# Patient Record
Sex: Male | Born: 1955 | Race: Black or African American | Hispanic: No | Marital: Married | Smoking: Former smoker
Health system: Southern US, Community
[De-identification: ages and names within clinical notes are randomized; demographics above are authoritative.]

## PROBLEM LIST (undated history)

## (undated) DIAGNOSIS — E291 Testicular hypofunction: Secondary | ICD-10-CM

## (undated) DIAGNOSIS — M069 Rheumatoid arthritis, unspecified: Secondary | ICD-10-CM

## (undated) DIAGNOSIS — E876 Hypokalemia: Secondary | ICD-10-CM

## (undated) DIAGNOSIS — J309 Allergic rhinitis, unspecified: Secondary | ICD-10-CM

## (undated) DIAGNOSIS — I10 Essential (primary) hypertension: Secondary | ICD-10-CM

## (undated) DIAGNOSIS — R05 Cough: Secondary | ICD-10-CM

## (undated) DIAGNOSIS — E119 Type 2 diabetes mellitus without complications: Secondary | ICD-10-CM

## (undated) DIAGNOSIS — I4891 Unspecified atrial fibrillation: Secondary | ICD-10-CM

## (undated) DIAGNOSIS — I509 Heart failure, unspecified: Secondary | ICD-10-CM

## (undated) DIAGNOSIS — E7800 Pure hypercholesterolemia, unspecified: Secondary | ICD-10-CM

## (undated) DIAGNOSIS — T7840XA Allergy, unspecified, initial encounter: Secondary | ICD-10-CM

## (undated) DIAGNOSIS — M1990 Unspecified osteoarthritis, unspecified site: Secondary | ICD-10-CM

## (undated) DIAGNOSIS — K279 Peptic ulcer, site unspecified, unspecified as acute or chronic, without hemorrhage or perforation: Secondary | ICD-10-CM

## (undated) DIAGNOSIS — H409 Unspecified glaucoma: Secondary | ICD-10-CM

## (undated) DIAGNOSIS — H269 Unspecified cataract: Secondary | ICD-10-CM

## (undated) DIAGNOSIS — N529 Male erectile dysfunction, unspecified: Secondary | ICD-10-CM

## (undated) DIAGNOSIS — G629 Polyneuropathy, unspecified: Secondary | ICD-10-CM

## (undated) DIAGNOSIS — Z86010 Personal history of colonic polyps: Secondary | ICD-10-CM

## (undated) DIAGNOSIS — E11319 Type 2 diabetes mellitus with unspecified diabetic retinopathy without macular edema: Secondary | ICD-10-CM

## (undated) DIAGNOSIS — M109 Gout, unspecified: Secondary | ICD-10-CM

## (undated) DIAGNOSIS — R591 Generalized enlarged lymph nodes: Secondary | ICD-10-CM

## (undated) HISTORY — DX: Hypokalemia: E87.6

## (undated) HISTORY — DX: Allergic rhinitis, unspecified: J30.9

## (undated) HISTORY — PX: CATARACT EXTRACTION: SUR2

## (undated) HISTORY — DX: Essential (primary) hypertension: I10

## (undated) HISTORY — DX: Peptic ulcer, site unspecified, unspecified as acute or chronic, without hemorrhage or perforation: K27.9

## (undated) HISTORY — DX: Type 2 diabetes mellitus with unspecified diabetic retinopathy without macular edema: E11.319

## (undated) HISTORY — DX: Cough: R05

## (undated) HISTORY — DX: Allergy, unspecified, initial encounter: T78.40XA

## (undated) HISTORY — PX: LYMPH NODE BIOPSY: SHX201

## (undated) HISTORY — DX: Heart failure, unspecified: I50.9

## (undated) HISTORY — DX: Rheumatoid arthritis, unspecified: M06.9

## (undated) HISTORY — DX: Male erectile dysfunction, unspecified: N52.9

## (undated) HISTORY — DX: Pure hypercholesterolemia, unspecified: E78.00

## (undated) HISTORY — DX: Unspecified atrial fibrillation: I48.91

## (undated) HISTORY — DX: Personal history of colonic polyps: Z86.010

## (undated) HISTORY — DX: Unspecified glaucoma: H40.9

## (undated) HISTORY — DX: Unspecified cataract: H26.9

## (undated) HISTORY — DX: Polyneuropathy, unspecified: G62.9

## (undated) HISTORY — DX: Unspecified osteoarthritis, unspecified site: M19.90

## (undated) HISTORY — DX: Testicular hypofunction: E29.1

## (undated) HISTORY — DX: Type 2 diabetes mellitus without complications: E11.9

## (undated) HISTORY — DX: Generalized enlarged lymph nodes: R59.1

## (undated) HISTORY — PX: CATARACT EXTRACTION, BILATERAL: SHX1313

## (undated) HISTORY — DX: Gout, unspecified: M10.9

---

## 1999-04-13 ENCOUNTER — Ambulatory Visit (HOSPITAL_COMMUNITY): Admission: RE | Admit: 1999-04-13 | Discharge: 1999-04-13 | Payer: Self-pay | Admitting: Orthopaedic Surgery

## 1999-04-13 ENCOUNTER — Encounter: Payer: Self-pay | Admitting: Orthopaedic Surgery

## 2000-06-25 ENCOUNTER — Encounter: Admission: RE | Admit: 2000-06-25 | Discharge: 2000-09-23 | Payer: Self-pay | Admitting: Internal Medicine

## 2000-12-19 ENCOUNTER — Encounter: Admission: RE | Admit: 2000-12-19 | Discharge: 2000-12-19 | Payer: Self-pay | Admitting: Occupational Medicine

## 2000-12-19 ENCOUNTER — Encounter: Payer: Self-pay | Admitting: Occupational Medicine

## 2002-02-27 HISTORY — PX: COLONOSCOPY: SHX174

## 2002-06-30 ENCOUNTER — Encounter: Payer: Self-pay | Admitting: Endocrinology

## 2002-06-30 ENCOUNTER — Encounter: Admission: RE | Admit: 2002-06-30 | Discharge: 2002-06-30 | Payer: Self-pay | Admitting: Endocrinology

## 2003-02-19 LAB — HM COLONOSCOPY

## 2004-08-23 ENCOUNTER — Ambulatory Visit: Payer: Self-pay | Admitting: Endocrinology

## 2004-08-26 ENCOUNTER — Ambulatory Visit: Payer: Self-pay | Admitting: Endocrinology

## 2004-09-09 ENCOUNTER — Ambulatory Visit: Payer: Self-pay | Admitting: Endocrinology

## 2004-11-22 ENCOUNTER — Ambulatory Visit: Payer: Self-pay | Admitting: Endocrinology

## 2005-01-31 ENCOUNTER — Ambulatory Visit: Payer: Self-pay | Admitting: Endocrinology

## 2005-02-01 ENCOUNTER — Ambulatory Visit: Payer: Self-pay | Admitting: Endocrinology

## 2005-02-22 ENCOUNTER — Ambulatory Visit: Payer: Self-pay

## 2005-03-13 ENCOUNTER — Ambulatory Visit: Payer: Self-pay | Admitting: Endocrinology

## 2005-09-22 ENCOUNTER — Ambulatory Visit: Payer: Self-pay | Admitting: Endocrinology

## 2005-09-29 ENCOUNTER — Ambulatory Visit: Payer: Self-pay | Admitting: Endocrinology

## 2005-10-09 ENCOUNTER — Ambulatory Visit: Payer: Self-pay | Admitting: Endocrinology

## 2005-11-15 ENCOUNTER — Ambulatory Visit: Payer: Self-pay | Admitting: Endocrinology

## 2005-12-26 ENCOUNTER — Ambulatory Visit (HOSPITAL_COMMUNITY): Admission: RE | Admit: 2005-12-26 | Discharge: 2005-12-26 | Payer: Self-pay | Admitting: Endocrinology

## 2005-12-26 ENCOUNTER — Ambulatory Visit: Payer: Self-pay | Admitting: Endocrinology

## 2006-03-13 ENCOUNTER — Ambulatory Visit: Payer: Self-pay | Admitting: Endocrinology

## 2006-03-13 LAB — CONVERTED CEMR LAB
Hgb A1c MFr Bld: 16.8 % — ABNORMAL HIGH (ref 4.6–6.0)
Microalb Creat Ratio: 5.1 mg/g (ref 0.0–30.0)
Microalb, Ur: 0.2 mg/dL (ref 0.0–1.9)
Testosterone: 267.87 ng/dL — ABNORMAL LOW (ref 350.00–890)

## 2006-07-13 ENCOUNTER — Ambulatory Visit: Payer: Self-pay | Admitting: Endocrinology

## 2006-07-13 LAB — CONVERTED CEMR LAB
Hgb A1c MFr Bld: 14.2 % — ABNORMAL HIGH (ref 4.6–6.0)
Prolactin: 5.4 ng/mL

## 2006-08-14 ENCOUNTER — Ambulatory Visit: Payer: Self-pay | Admitting: Endocrinology

## 2006-09-22 ENCOUNTER — Encounter: Payer: Self-pay | Admitting: Endocrinology

## 2006-09-22 DIAGNOSIS — M109 Gout, unspecified: Secondary | ICD-10-CM

## 2006-09-22 DIAGNOSIS — M069 Rheumatoid arthritis, unspecified: Secondary | ICD-10-CM

## 2006-09-22 DIAGNOSIS — E119 Type 2 diabetes mellitus without complications: Secondary | ICD-10-CM

## 2006-09-22 HISTORY — DX: Type 2 diabetes mellitus without complications: E11.9

## 2006-09-22 HISTORY — DX: Rheumatoid arthritis, unspecified: M06.9

## 2006-09-22 HISTORY — DX: Gout, unspecified: M10.9

## 2006-09-30 ENCOUNTER — Encounter: Admission: RE | Admit: 2006-09-30 | Discharge: 2006-09-30 | Payer: Self-pay | Admitting: Endocrinology

## 2006-10-17 ENCOUNTER — Ambulatory Visit: Payer: Self-pay | Admitting: Endocrinology

## 2007-01-15 ENCOUNTER — Ambulatory Visit: Payer: Self-pay | Admitting: Endocrinology

## 2007-01-15 DIAGNOSIS — E291 Testicular hypofunction: Secondary | ICD-10-CM

## 2007-01-15 HISTORY — DX: Testicular hypofunction: E29.1

## 2007-02-12 LAB — CONVERTED CEMR LAB: Hgb A1c MFr Bld: 12.4 % — ABNORMAL HIGH (ref 4.6–6.0)

## 2007-05-07 ENCOUNTER — Ambulatory Visit: Payer: Self-pay | Admitting: Endocrinology

## 2007-12-27 ENCOUNTER — Ambulatory Visit: Payer: Self-pay | Admitting: Endocrinology

## 2008-02-07 ENCOUNTER — Ambulatory Visit: Payer: Self-pay | Admitting: Endocrinology

## 2008-02-07 DIAGNOSIS — N529 Male erectile dysfunction, unspecified: Secondary | ICD-10-CM

## 2008-02-07 HISTORY — DX: Male erectile dysfunction, unspecified: N52.9

## 2008-02-09 LAB — CONVERTED CEMR LAB
Basophils Absolute: 0 10*3/uL (ref 0.0–0.1)
Basophils Relative: 0.4 % (ref 0.0–3.0)
Bilirubin Urine: NEGATIVE
Bilirubin, Direct: 0.2 mg/dL (ref 0.0–0.3)
Calcium: 9.2 mg/dL (ref 8.4–10.5)
Cholesterol: 195 mg/dL (ref 0–200)
Creatinine, Ser: 0.7 mg/dL (ref 0.4–1.5)
Creatinine,U: 153.5 mg/dL
Eosinophils Absolute: 0.2 10*3/uL (ref 0.0–0.7)
GFR calc non Af Amer: 126 mL/min
HDL: 32.5 mg/dL — ABNORMAL LOW (ref >39.0)
Hemoglobin: 14.8 g/dL (ref 13.0–17.0)
Hgb A1c MFr Bld: 12.9 % — ABNORMAL HIGH (ref 4.6–6.0)
Ketones, ur: NEGATIVE mg/dL
LDL Cholesterol: 138 mg/dL — ABNORMAL HIGH (ref 0–99)
Leukocytes, UA: NEGATIVE
MCHC: 34.4 g/dL (ref 30.0–36.0)
MCV: 83.1 fL (ref 78.0–100.0)
Microalb Creat Ratio: 7.8 mg/g (ref 0.0–30.0)
Microalb, Ur: 1.2 mg/dL (ref 0.0–1.9)
Neutro Abs: 3.6 10*3/uL (ref 1.4–7.7)
Neutrophils Relative %: 68.1 % (ref 43.0–77.0)
RBC: 5.18 M/uL (ref 4.22–5.81)
RDW: 11.9 % (ref 11.5–14.6)
Sodium: 135 meq/L (ref 135–145)
Specific Gravity, Urine: 1.02 (ref 1.000–1.03)
Total Bilirubin: 0.9 mg/dL (ref 0.3–1.2)
Total CHOL/HDL Ratio: 6
Triglycerides: 122 mg/dL (ref 0–149)
Uric Acid, Serum: 5.4 mg/dL (ref 4.0–7.8)
Urine Glucose: 250 mg/dL — AB
Urobilinogen, UA: 0.2 (ref 0.0–1.0)
VLDL: 24 mg/dL (ref 0–40)

## 2008-02-13 ENCOUNTER — Ambulatory Visit: Payer: Self-pay | Admitting: Endocrinology

## 2008-06-02 ENCOUNTER — Ambulatory Visit: Payer: Self-pay | Admitting: Endocrinology

## 2008-06-15 ENCOUNTER — Ambulatory Visit: Payer: Self-pay | Admitting: Internal Medicine

## 2008-06-15 ENCOUNTER — Inpatient Hospital Stay (HOSPITAL_COMMUNITY): Admission: AD | Admit: 2008-06-15 | Discharge: 2008-06-16 | Payer: Self-pay | Admitting: Internal Medicine

## 2008-06-15 ENCOUNTER — Ambulatory Visit: Payer: Self-pay | Admitting: Cardiology

## 2008-06-15 LAB — HM DIABETES FOOT EXAM

## 2008-06-16 ENCOUNTER — Encounter: Payer: Self-pay | Admitting: Internal Medicine

## 2008-06-19 ENCOUNTER — Ambulatory Visit: Payer: Self-pay | Admitting: Endocrinology

## 2008-06-19 DIAGNOSIS — R3129 Other microscopic hematuria: Secondary | ICD-10-CM | POA: Insufficient documentation

## 2008-06-19 DIAGNOSIS — I509 Heart failure, unspecified: Secondary | ICD-10-CM

## 2008-06-19 HISTORY — DX: Heart failure, unspecified: I50.9

## 2008-06-19 LAB — CONVERTED CEMR LAB
BUN: 19 mg/dL (ref 6–23)
CO2: 27 meq/L (ref 19–32)
Chloride: 110 meq/L (ref 96–112)
Glucose, Bld: 86 mg/dL (ref 70–99)
Ketones, ur: NEGATIVE mg/dL
Leukocytes, UA: NEGATIVE
Potassium: 4.9 meq/L (ref 3.5–5.1)
Pro B Natriuretic peptide (BNP): 96 pg/mL (ref 0.0–100.0)
Specific Gravity, Urine: 1.015 (ref 1.000–1.030)
Urine Glucose: NEGATIVE mg/dL
pH: 5.5 (ref 5.0–8.0)

## 2008-06-23 ENCOUNTER — Encounter (INDEPENDENT_AMBULATORY_CARE_PROVIDER_SITE_OTHER): Payer: Self-pay | Admitting: *Deleted

## 2008-07-02 ENCOUNTER — Ambulatory Visit: Payer: Self-pay | Admitting: Endocrinology

## 2008-07-02 DIAGNOSIS — J309 Allergic rhinitis, unspecified: Secondary | ICD-10-CM | POA: Insufficient documentation

## 2008-07-02 DIAGNOSIS — E7800 Pure hypercholesterolemia, unspecified: Secondary | ICD-10-CM | POA: Insufficient documentation

## 2008-07-02 HISTORY — DX: Allergic rhinitis, unspecified: J30.9

## 2008-07-02 HISTORY — DX: Pure hypercholesterolemia, unspecified: E78.00

## 2008-07-02 LAB — CONVERTED CEMR LAB
Calcium: 9.5 mg/dL (ref 8.4–10.5)
Chloride: 106 meq/L (ref 96–112)
Creatinine, Ser: 0.9 mg/dL (ref 0.4–1.5)

## 2008-07-03 ENCOUNTER — Telehealth (INDEPENDENT_AMBULATORY_CARE_PROVIDER_SITE_OTHER): Payer: Self-pay | Admitting: *Deleted

## 2008-07-03 ENCOUNTER — Encounter: Payer: Self-pay | Admitting: Endocrinology

## 2008-07-07 ENCOUNTER — Telehealth (INDEPENDENT_AMBULATORY_CARE_PROVIDER_SITE_OTHER): Payer: Self-pay | Admitting: *Deleted

## 2008-07-07 ENCOUNTER — Encounter: Payer: Self-pay | Admitting: Endocrinology

## 2008-07-16 ENCOUNTER — Encounter: Payer: Self-pay | Admitting: Endocrinology

## 2008-07-31 ENCOUNTER — Ambulatory Visit: Payer: Self-pay | Admitting: Endocrinology

## 2008-08-01 LAB — CONVERTED CEMR LAB
Basophils Relative: 0.7 % (ref 0.0–3.0)
Eosinophils Relative: 6.8 % — ABNORMAL HIGH (ref 0.0–5.0)
Lymphocytes Relative: 40.8 % (ref 12.0–46.0)
MCV: 79.6 fL (ref 78.0–100.0)
Neutrophils Relative %: 39 % — ABNORMAL LOW (ref 43.0–77.0)
RBC: 4.56 M/uL (ref 4.22–5.81)
WBC: 5.8 10*3/uL (ref 4.5–10.5)

## 2008-08-07 ENCOUNTER — Telehealth (INDEPENDENT_AMBULATORY_CARE_PROVIDER_SITE_OTHER): Payer: Self-pay | Admitting: *Deleted

## 2008-08-10 ENCOUNTER — Ambulatory Visit: Payer: Self-pay | Admitting: Internal Medicine

## 2008-08-18 ENCOUNTER — Encounter: Payer: Self-pay | Admitting: Endocrinology

## 2008-08-18 LAB — COMPREHENSIVE METABOLIC PANEL
Alkaline Phosphatase: 78 U/L (ref 39–117)
BUN: 16 mg/dL (ref 6–23)
CO2: 23 mEq/L (ref 19–32)
Creatinine, Ser: 0.91 mg/dL (ref 0.40–1.50)
Glucose, Bld: 135 mg/dL — ABNORMAL HIGH (ref 70–99)
Sodium: 138 mEq/L (ref 135–145)
Total Bilirubin: 0.4 mg/dL (ref 0.3–1.2)

## 2008-08-18 LAB — CBC WITH DIFFERENTIAL/PLATELET
Eosinophils Absolute: 0.2 10*3/uL (ref 0.0–0.5)
HCT: 35.2 % — ABNORMAL LOW (ref 38.4–49.9)
LYMPH%: 47.2 % (ref 14.0–49.0)
MCV: 80.2 fL (ref 79.3–98.0)
MONO%: 13.4 % (ref 0.0–14.0)
NEUT#: 1.9 10*3/uL (ref 1.5–6.5)
NEUT%: 34.5 % — ABNORMAL LOW (ref 39.0–75.0)
Platelets: 181 10*3/uL (ref 140–400)
RBC: 4.4 10*6/uL (ref 4.20–5.82)

## 2008-08-18 LAB — LACTATE DEHYDROGENASE: LDH: 103 U/L (ref 94–250)

## 2008-08-20 LAB — BETA HCG QUANT (REF LAB): Beta hCG, Tumor Marker: 0.5 m[IU]/mL (ref <5.0)

## 2008-08-20 LAB — AFP TUMOR MARKER: AFP-Tumor Marker: 1.6 ng/mL (ref 0.0–8.0)

## 2008-08-21 ENCOUNTER — Ambulatory Visit (HOSPITAL_COMMUNITY): Admission: RE | Admit: 2008-08-21 | Discharge: 2008-08-21 | Payer: Self-pay | Admitting: Internal Medicine

## 2008-09-03 ENCOUNTER — Encounter: Payer: Self-pay | Admitting: Endocrinology

## 2008-10-05 ENCOUNTER — Ambulatory Visit: Payer: Self-pay | Admitting: Endocrinology

## 2008-10-05 DIAGNOSIS — R059 Cough, unspecified: Secondary | ICD-10-CM

## 2008-10-05 HISTORY — DX: Cough, unspecified: R05.9

## 2008-11-12 ENCOUNTER — Ambulatory Visit: Payer: Self-pay | Admitting: Internal Medicine

## 2008-11-16 ENCOUNTER — Ambulatory Visit (HOSPITAL_COMMUNITY): Admission: RE | Admit: 2008-11-16 | Discharge: 2008-11-16 | Payer: Self-pay | Admitting: Internal Medicine

## 2008-11-16 LAB — COMPREHENSIVE METABOLIC PANEL
AST: 18 U/L (ref 0–37)
Albumin: 3.6 g/dL (ref 3.5–5.2)
Alkaline Phosphatase: 111 U/L (ref 39–117)
BUN: 9 mg/dL (ref 6–23)
Potassium: 3.4 mEq/L — ABNORMAL LOW (ref 3.5–5.3)
Sodium: 136 mEq/L (ref 135–145)
Total Bilirubin: 0.3 mg/dL (ref 0.3–1.2)
Total Protein: 7.6 g/dL (ref 6.0–8.3)

## 2008-11-16 LAB — CBC WITH DIFFERENTIAL/PLATELET
EOS%: 2.5 % (ref 0.0–7.0)
MCH: 29.4 pg (ref 27.2–33.4)
MCV: 82.5 fL (ref 79.3–98.0)
MONO%: 12 % (ref 0.0–14.0)
RBC: 4.73 10*6/uL (ref 4.20–5.82)
RDW: 12.8 % (ref 11.0–14.6)

## 2008-11-18 ENCOUNTER — Encounter: Payer: Self-pay | Admitting: Endocrinology

## 2008-11-25 ENCOUNTER — Encounter: Payer: Self-pay | Admitting: Endocrinology

## 2008-12-02 ENCOUNTER — Encounter: Admission: RE | Admit: 2008-12-02 | Discharge: 2008-12-02 | Payer: Self-pay | Admitting: General Surgery

## 2008-12-07 ENCOUNTER — Encounter (INDEPENDENT_AMBULATORY_CARE_PROVIDER_SITE_OTHER): Payer: Self-pay | Admitting: General Surgery

## 2008-12-07 ENCOUNTER — Ambulatory Visit (HOSPITAL_BASED_OUTPATIENT_CLINIC_OR_DEPARTMENT_OTHER): Admission: RE | Admit: 2008-12-07 | Discharge: 2008-12-07 | Payer: Self-pay | Admitting: General Surgery

## 2008-12-24 ENCOUNTER — Ambulatory Visit: Payer: Self-pay | Admitting: Internal Medicine

## 2008-12-29 ENCOUNTER — Encounter: Payer: Self-pay | Admitting: Endocrinology

## 2008-12-29 LAB — CBC WITH DIFFERENTIAL/PLATELET
BASO%: 0.7 % (ref 0.0–2.0)
Basophils Absolute: 0 10*3/uL (ref 0.0–0.1)
EOS%: 4.4 % (ref 0.0–7.0)
HGB: 13.8 g/dL (ref 13.0–17.1)
MCH: 29.1 pg (ref 27.2–33.4)
MCHC: 34.9 g/dL (ref 32.0–36.0)
RDW: 13.1 % (ref 11.0–14.6)
lymph#: 1.8 10*3/uL (ref 0.9–3.3)

## 2008-12-29 LAB — COMPREHENSIVE METABOLIC PANEL
ALT: 12 U/L (ref 0–53)
AST: 13 U/L (ref 0–37)
Albumin: 4.1 g/dL (ref 3.5–5.2)
Alkaline Phosphatase: 104 U/L (ref 39–117)
Potassium: 4 mEq/L (ref 3.5–5.3)
Sodium: 136 mEq/L (ref 135–145)
Total Protein: 7.4 g/dL (ref 6.0–8.3)

## 2008-12-29 LAB — LACTATE DEHYDROGENASE: LDH: 121 U/L (ref 94–250)

## 2009-01-06 ENCOUNTER — Encounter: Payer: Self-pay | Admitting: Endocrinology

## 2009-02-08 ENCOUNTER — Ambulatory Visit: Payer: Self-pay | Admitting: Endocrinology

## 2009-02-08 LAB — CONVERTED CEMR LAB
ALT: 15 units/L (ref 0–53)
Alkaline Phosphatase: 98 units/L (ref 39–117)
Basophils Relative: 1.3 % (ref 0.0–3.0)
Bilirubin, Direct: 0.1 mg/dL (ref 0.0–0.3)
Calcium: 9.1 mg/dL (ref 8.4–10.5)
Creatinine, Ser: 0.8 mg/dL (ref 0.4–1.5)
Creatinine,U: 155.7 mg/dL
Eosinophils Absolute: 0.2 10*3/uL (ref 0.0–0.7)
Eosinophils Relative: 3.9 % (ref 0.0–5.0)
HDL: 37.1 mg/dL — ABNORMAL LOW (ref >39.00)
Hemoglobin, Urine: NEGATIVE
LDL Cholesterol: 119 mg/dL — ABNORMAL HIGH (ref 0–99)
Lymphocytes Relative: 45.6 % (ref 12.0–46.0)
Microalb Creat Ratio: 7.7 mg/g (ref 0.0–30.0)
Microalb, Ur: 1.2 mg/dL (ref 0.0–1.9)
Neutrophils Relative %: 34.1 % — ABNORMAL LOW (ref 43.0–77.0)
PSA: 1.68 ng/mL (ref 0.10–4.00)
RBC: 5 M/uL (ref 4.22–5.81)
Total Bilirubin: 0.8 mg/dL (ref 0.3–1.2)
Total CHOL/HDL Ratio: 5
Total Protein, Urine: NEGATIVE mg/dL
Total Protein: 7.5 g/dL (ref 6.0–8.3)
Triglycerides: 149 mg/dL (ref 0.0–149.0)
Urine Glucose: >=1000 mg/dL
VLDL: 29.8 mg/dL (ref 0.0–40.0)
WBC: 4.2 10*3/uL — ABNORMAL LOW (ref 4.5–10.5)
pH: 6 (ref 5.0–8.0)

## 2009-02-15 ENCOUNTER — Ambulatory Visit: Payer: Self-pay | Admitting: Endocrinology

## 2009-02-15 DIAGNOSIS — M79609 Pain in unspecified limb: Secondary | ICD-10-CM | POA: Insufficient documentation

## 2009-03-05 ENCOUNTER — Encounter: Payer: Self-pay | Admitting: Endocrinology

## 2009-04-02 ENCOUNTER — Encounter: Payer: Self-pay | Admitting: Endocrinology

## 2009-05-31 ENCOUNTER — Ambulatory Visit: Payer: Self-pay | Admitting: Endocrinology

## 2009-05-31 DIAGNOSIS — I10 Essential (primary) hypertension: Secondary | ICD-10-CM

## 2009-05-31 DIAGNOSIS — E876 Hypokalemia: Secondary | ICD-10-CM

## 2009-05-31 HISTORY — DX: Essential (primary) hypertension: I10

## 2009-05-31 LAB — CONVERTED CEMR LAB
AST: 23 units/L (ref 0–37)
Alkaline Phosphatase: 94 units/L (ref 39–117)
BUN: 11 mg/dL (ref 6–23)
Bilirubin, Direct: 0.1 mg/dL (ref 0.0–0.3)
CO2: 30 meq/L (ref 19–32)
Chloride: 103 meq/L (ref 96–112)
Creatinine, Ser: 0.7 mg/dL (ref 0.4–1.5)
Glucose, Bld: 93 mg/dL (ref 70–99)
LDL Cholesterol: 73 mg/dL (ref 0–99)
Potassium: 3.6 meq/L (ref 3.5–5.1)
Total Bilirubin: 0.6 mg/dL (ref 0.3–1.2)
Total CHOL/HDL Ratio: 3
Triglycerides: 187 mg/dL — ABNORMAL HIGH (ref 0.0–149.0)

## 2009-09-01 ENCOUNTER — Ambulatory Visit: Payer: Self-pay | Admitting: Endocrinology

## 2009-09-01 DIAGNOSIS — N476 Balanoposthitis: Secondary | ICD-10-CM | POA: Insufficient documentation

## 2009-09-01 LAB — CONVERTED CEMR LAB
Basophils Absolute: 0.1 10*3/uL (ref 0.0–0.1)
Basophils Relative: 1.1 % (ref 0.0–3.0)
CO2: 24 meq/L (ref 19–32)
Calcium: 8.9 mg/dL (ref 8.4–10.5)
Chloride: 103 meq/L (ref 96–112)
Creatinine, Ser: 0.8 mg/dL (ref 0.4–1.5)
Eosinophils Absolute: 0.3 10*3/uL (ref 0.0–0.7)
Glucose, Bld: 374 mg/dL — ABNORMAL HIGH (ref 70–99)
Lymphocytes Relative: 36.3 % (ref 12.0–46.0)
MCHC: 34.9 g/dL (ref 30.0–36.0)
MCV: 83.7 fL (ref 78.0–100.0)
Monocytes Absolute: 0.5 10*3/uL (ref 0.1–1.0)
Neutro Abs: 2.6 10*3/uL (ref 1.4–7.7)
Neutrophils Relative %: 48.4 % (ref 43.0–77.0)
RDW: 13.4 % (ref 11.5–14.6)

## 2009-10-25 ENCOUNTER — Encounter: Admission: RE | Admit: 2009-10-25 | Discharge: 2009-10-25 | Payer: Self-pay | Admitting: Orthopedic Surgery

## 2009-11-30 ENCOUNTER — Ambulatory Visit: Payer: Self-pay | Admitting: Endocrinology

## 2009-11-30 LAB — CONVERTED CEMR LAB: Hgb A1c MFr Bld: 15.8 % — ABNORMAL HIGH (ref 4.6–6.5)

## 2010-03-20 ENCOUNTER — Encounter: Payer: Self-pay | Admitting: Orthopedic Surgery

## 2010-03-20 ENCOUNTER — Encounter: Payer: Self-pay | Admitting: Endocrinology

## 2010-03-29 ENCOUNTER — Ambulatory Visit
Admission: RE | Admit: 2010-03-29 | Discharge: 2010-03-29 | Payer: Self-pay | Source: Home / Self Care | Attending: Internal Medicine | Admitting: Internal Medicine

## 2010-03-29 ENCOUNTER — Other Ambulatory Visit: Payer: Self-pay | Admitting: Internal Medicine

## 2010-03-29 LAB — CBC WITH DIFFERENTIAL/PLATELET
Basophils Relative: 1 % (ref 0.0–3.0)
Eosinophils Absolute: 0.2 10*3/uL (ref 0.0–0.7)
Eosinophils Relative: 3.9 % (ref 0.0–5.0)
HCT: 39.2 % (ref 39.0–52.0)
Lymphs Abs: 1.6 10*3/uL (ref 0.7–4.0)
MCHC: 35.2 g/dL (ref 30.0–36.0)
MCV: 81.8 fl (ref 78.0–100.0)
Monocytes Absolute: 0.5 10*3/uL (ref 0.1–1.0)
Neutro Abs: 2 10*3/uL (ref 1.4–7.7)
RBC: 4.8 Mil/uL (ref 4.22–5.81)
WBC: 4.4 10*3/uL — ABNORMAL LOW (ref 4.5–10.5)

## 2010-03-29 LAB — URINALYSIS
Bilirubin Urine: NEGATIVE
Hemoglobin, Urine: NEGATIVE
Nitrite: NEGATIVE
Total Protein, Urine: NEGATIVE
Urobilinogen, UA: 0.2 (ref 0.0–1.0)

## 2010-03-29 LAB — LIPID PANEL
Cholesterol: 220 mg/dL — ABNORMAL HIGH (ref 0–200)
Total CHOL/HDL Ratio: 5
VLDL: 44.6 mg/dL — ABNORMAL HIGH (ref 0.0–40.0)

## 2010-03-29 LAB — HEPATIC FUNCTION PANEL
ALT: 20 U/L (ref 0–53)
Albumin: 3.6 g/dL (ref 3.5–5.2)
Total Protein: 8 g/dL (ref 6.0–8.3)

## 2010-03-29 LAB — BASIC METABOLIC PANEL
CO2: 29 mEq/L (ref 19–32)
Chloride: 101 mEq/L (ref 96–112)
Creatinine, Ser: 0.8 mg/dL (ref 0.4–1.5)
Potassium: 5.6 mEq/L — ABNORMAL HIGH (ref 3.5–5.1)

## 2010-03-29 LAB — TSH: TSH: 0.77 u[IU]/mL (ref 0.35–5.50)

## 2010-03-29 NOTE — Assessment & Plan Note (Signed)
Summary: 3 mos f/u #$/cd   Vital Signs:  Patient profile:   55 year old male Height:      71 inches (180.34 cm) Weight:      204.44 pounds (92.93 kg) O2 Sat:      97 % on Room air Temp:     98.4 degrees F (36.89 degrees C) oral Pulse rate:   91 / minute BP sitting:   92 / 68  (left arm) Cuff size:   large  Vitals Entered By: Josph Macho RMA (May 31, 2009 8:12 AM)  O2 Flow:  Room air CC: 3 month follow up/ CF Is Patient Diabetic? Yes   Primary Provider:  Minus Breeding MD  CC:  3 month follow up/ CF.  History of Present Illness: pt says he has few mos of slight dizziness, but no chest pain.  no associated pain in the head. he has not had any further episodes of chf, as he had 1 year ago.  he has doe since the episode. no cbg record, but states cbg's are well-controlled, except for mild hypoglycemia at 10 am. he takes zocor as rx'ed.  Current Medications (verified): 1)  Adult Aspirin Low Strength 81 Mg  Tbdp (Aspirin) .... Take 1 By Mouth Qd 2)  Bd Insulin Syringe 27.5g X 5/8" 2 Ml Misc (Insulin Syringe-Needle U-100) .... Once A Day 3)  Cialis 20 Mg  Tabs (Tadalafil) .... Prn 4)  Allopurinol 300 Mg  Tabs (Allopurinol) .... Qd 5)  Relion 70/30 70-30 % Susp (Insulin Isophane & Regular) .... 25 Units Qam 6)  Furosemide 40 Mg Tabs (Furosemide) .Marland Kitchen.. 1 Qd 7)  Metoprolol Succinate 25 Mg Xr24h-Tab (Metoprolol Succinate) .Marland Kitchen.. 1 Qd 8)  Simvastatin 80 Mg Tabs (Simvastatin) .Marland Kitchen.. 1 Qhs 9)  Losartan Potassium 25 Mg Tabs (Losartan Potassium) .Marland Kitchen.. 1 Qd  Allergies (verified): 1)  ! Penicillin 2)  ! Corky Crafts  Past History:  Past Medical History: Last updated: 01/15/2007 Diabetes mellitus, type II Rheumatoid arthritis E.D. 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    Physical Exam  General:  normal appearance.   Lungs:  Clear to auscultation bilaterally. Normal respiratory effort.  Heart:  Regular rate and rhythm without murmurs or gallops noted. Normal S1,S2.   Additional Exam:  LDL Cholesterol     73 mg/dL Hemoglobin Q0H    47.4 %     Impression & Recommendations:  Problem # 1:  DIABETES MELLITUS, TYPE II (ICD-250.00) he may do better with an insulin which does not have to be tied to any meal.  Problem # 2:  CHF (ICD-428.0) i am uncertain if is any cardiol f/u needed  Problem # 3:  HYPERTENSION (ICD-401.9) overcontrolled  Problem # 4:  HYPERCHOLESTEROLEMIA (ICD-272.0) well-controlled  Medications Added to Medication List This Visit: 1)  Levemir 100 Unit/ml Soln (Insulin detemir) .... 25 units each am  Other Orders: Cardiology Referral (Cardiology) TLB-Lipid Panel (80061-LIPID) TLB-Hepatic/Liver Function Pnl (80076-HEPATIC) TLB-A1C / Hgb A1C (Glycohemoglobin) (83036-A1C) TLB-BMP (Basic Metabolic Panel-BMET) (80048-METABOL) Est. Patient Level IV (25956)  Patient Instructions: 1)  refer cardiology.  you will be called with a day and time for an appointment. 2)  stop losartan. 3)  change insulin to levemir 25 units each am. 4)  check your blood sugar 2 times a day.  vary the time  of day when you check, between before the 3 meals, and at bedtime.  also check if you have symptoms of your blood sugar being too high or too low.  please keep a record of the readings and bring it to your next appointment here.  please call us sooner if you are having low blood sugar episodes. 5)  tests are being ordered for you today.  a few days after the test(s), please call [phone] to hear your test results. 6)  Please schedule a follow-up appointment in 1 month. Prescriptions: LEVEMIR 100 UNIT/ML SOLN (INSULIN DETEMIR) 25 units each am  #1 vial x 11   Entered and Authorized by:   Minus Breeding MD   Signed by:   Minus Breeding MD on  05/31/2009   Method used:   Electronically to        [facility] [phone]* (retail)       [address]       [place], [place]  [address]       Ph: [phone]       Fax: [phone]   RxID:   [id number]

## 2010-03-29 NOTE — Consult Note (Signed)
Summary: [facility]  [facility]   Imported By: Lennie Odor 04/07/2009 12:29:58  _____________________________________________________________________  External Attachment:    Type:   Image     Comment:   External Document

## 2010-03-29 NOTE — Assessment & Plan Note (Signed)
Summary: 3 MOS F/U // #? CD   Vital Signs:  Patient profile:   55 year old male Height:      71 inches (180.34 cm) Weight:      201.13 pounds (91.42 kg) BMI:     28.15 O2 Sat:      97 % on Room air Temp:     97.7 degrees F (36.50 degrees C) oral Pulse rate:   72 / minute BP sitting:   102 / 66  (left arm) Cuff size:   regular  Vitals Entered By: Brenton Grills MA (September 01, 2009 8:05 AM)  O2 Flow:  Room air CC: 3 mo f/u/aj Comments pt states he is taking only adult asprin, insulin, and simvastatin   Primary Provider:  Minus Breeding MD  CC:  3 mo f/u/aj.  History of Present Illness: no cbg record, but states cbg's vary from 70-130, with no trend throughout the day.   pt states he feels well in general, except for fatigue.   pt states few weeks of moderate itching of the penis, and associated redness  Current Medications (verified): 1)  Adult Aspirin Low Strength 81 Mg  Tbdp (Aspirin) .... Take 1 By Mouth Qd 2)  Bd Insulin Syringe 27.5g X 5/8" 2 Ml Misc (Insulin Syringe-Needle U-100) .... Once A Day 3)  Cialis 20 Mg  Tabs (Tadalafil) .... Prn 4)  Allopurinol 300 Mg  Tabs (Allopurinol) .... Qd 5)  Furosemide 40 Mg Tabs (Furosemide) .Marland Kitchen.. 1 Qd 6)  Metoprolol Succinate 25 Mg Xr24h-Tab (Metoprolol Succinate) .Marland Kitchen.. 1 Qd 7)  Simvastatin 80 Mg Tabs (Simvastatin) .Marland Kitchen.. 1 Qhs 8)  Levemir 100 Unit/ml Soln (Insulin Detemir) .... 25 Units Each Am  Allergies (verified): 1)  ! Penicillin 2)  ! Corky Crafts  Past History:  Past Medical History: Last updated: 06/07/2009 Diabetes mellitus, type II Rheumatoid arthritis E.D. 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    Physical Exam  General:  normal appearance.   Lungs:  Clear to auscultation bilaterally. Normal respiratory effort.  Heart:  Regular rate and rhythm without  murmurs or gallops noted. Normal S1,S2.   Genitalia:  penis has slight erythema Additional Exam:   Hemoglobin A1C       [H]  12.6 %    Impression & Recommendations:  Problem # 1:  DIABETES MELLITUS, TYPE II (ICD-250.00)  Problem # 2:  BALANITIS (ICD-607.1) recurrent  Problem # 3:  FATIGUE (ICD-780.79) no cause found, except for #1  Problem # 4:  HYPERCHOLESTEROLEMIA (ICD-272.0) zocor can interact with fluconazole  Medications Added to Medication List This Visit: 1)  Levemir 100 Unit/ml Soln (Insulin detemir) .... 30 units each am 2)  Fluconazole 150 Mg Tabs (Fluconazole) .Marland Kitchen.. 1 once daily  Other Orders: TLB-A1C / Hgb A1C (Glycohemoglobin) (83036-A1C) TLB-BMP (Basic Metabolic Panel-BMET) (80048-METABOL) TLB-CBC Platelet - w/Differential (85025-CBCD) TLB-TSH (Thyroid Stimulating Hormone) (84443-TSH) Est. Patient Level IV ([address])  Patient Instructions: 1)  blood tests are being ordered for you today.  please call [phone] to hear your test results.  pending the test results, please continue the same medications for now 2)  check your blood sugar 2 times a day.  vary the time of day when you check, between before the 3 meals, and at bedtime.  also check if you have symptoms of your blood sugar being too high or too low.  please keep a record of the readings and bring it to your next appointment here.  please call us sooner if you are having low blood sugar episodes. 3)  Please schedule a follow-up appointment in 3 months. 4)  fluconazole 150 mg once daily, x 2 days. 5)  while on this, don't take simvastatin. 6)  (update: i left message on phone-tree:  verify you are taking levemir as rx'ed.  if not, take it as rx'ed.  if so, increase to 30 units qam). Prescriptions: LEVEMIR 100 UNIT/ML SOLN (INSULIN DETEMIR) 30 units each am  #1 vial x 11   Entered and Authorized by:   Minus Breeding MD   Signed by:   Minus Breeding MD on 09/01/2009   Method used:   Electronically to         [facility] [phone]* (retail)       [address]       [place], [place]  [address]       Ph: [phone]       Fax: [phone]   RxID:   [id number] FLUCONAZOLE 150 MG TABS (FLUCONAZOLE) 1 once daily  #2 x 0   Entered by:   Margaret Pyle, CMA   Authorized by:   Minus Breeding MD   Signed by:   Margaret Pyle, CMA on 09/01/2009   Method used:   Electronically to        [facility] [phone]* (retail)       [address]       [place], [place]  [address]       Ph: [phone]       Fax: [phone]   RxID:   [id number] FLUCONAZOLE 150 MG TABS (FLUCONAZOLE) 1 once daily  #2 x 0   Entered and Authorized by:   Minus Breeding MD   Signed by:   Minus Breeding MD on 09/01/2009   Method used:   Electronically to        [facility] [address] [phone]* (retail)       [address]       [place], [place]  [address]       Ph: [phone] or [phone]       Fax: [phone]   RxID:   [id number]

## 2010-03-29 NOTE — Assessment & Plan Note (Signed)
Summary: 3 MTH FU  STC   Vital Signs:  Patient profile:   55 year old male Height:      71 inches Weight:      196.75 pounds BMI:     27.54 O2 Sat:      98 % on Room air Temp:     98.1 degrees F oral Pulse rate:   72 / minute BP sitting:   104 / 66  (left arm) Cuff size:   regular  Vitals Entered By: Margaret Pyle, CMA (November 30, 2009 8:03 AM)  O2 Flow:  Room air CC: 3 mth follow up   Primary Provider:  Minus Breeding MD  CC:  3 mth follow up.  History of Present Illness: pt states he feels well in general.  no cbg record, but states cbg's are well-controlled.  he takes levemir 25 units each am.    Allergies: 1)  ! Penicillin 2)  ! Corky Crafts  Past History:  Past Medical History: Last updated: 06/07/2009 Diabetes mellitus, type II Rheumatoid arthritis E.D. 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    Physical Exam  General:  normal appearance.   Pulses:  dorsalis pedis intact bilat.   Extremities:  no deformity.  no ulcer on the feet.  feet are of normal color and temp.  no edema  Neurologic:  sensation is intact to touch on the feet   Additional Exam:   Hemoglobin A1C       [H]  15.8 %    Impression & Recommendations:  Problem # 1:  DIABETES MELLITUS, TYPE II (ICD-250.00) very poor control.  ? compliance  Other Orders: TLB-A1C / Hgb A1C (Glycohemoglobin) (83036-A1C) Est. Patient Level III ([address])  Patient Instructions: 1)  blood tests are being ordered for you today.  please call [phone] to hear your test results.  pending the test results, please continue the same medications for now.  however, after seeing the results, i may advise increasing the insulin to 30 units each am.   2)  check your blood sugar 2 times a day.  vary the time of day when you check, between before the 3 meals, and at bedtime.  also check if you have symptoms of  your blood sugar being too high or too low.  please keep a record of the readings and bring it to your next appointment here.  please call us sooner if you are having low blood sugar episodes. 3)  Please schedule a physical appointment in 3-4 months. 4)  (update: i left message on phone-tree:  rx as we discussed)

## 2010-04-05 ENCOUNTER — Encounter: Payer: Self-pay | Admitting: Endocrinology

## 2010-04-05 ENCOUNTER — Encounter (INDEPENDENT_AMBULATORY_CARE_PROVIDER_SITE_OTHER): Payer: BC Managed Care – PPO | Admitting: Endocrinology

## 2010-04-05 DIAGNOSIS — E119 Type 2 diabetes mellitus without complications: Secondary | ICD-10-CM

## 2010-04-05 DIAGNOSIS — Z0000 Encounter for general adult medical examination without abnormal findings: Secondary | ICD-10-CM

## 2010-04-14 NOTE — Assessment & Plan Note (Signed)
Summary: CPX/NWS #   Vital Signs:  Patient profile:   55 year old male Height:      71 inches (180.34 cm) Weight:      187.50 pounds (85.23 kg) BMI:     26.25 O2 Sat:      99 % on Room air Temp:     98.6 degrees F (37.00 degrees C) oral Pulse rate:   79 / minute Pulse rhythm:   regular BP sitting:   124 / 72  (left arm) Cuff size:   regular  Vitals Entered By: Brenton Grills CMA Duncan Dull) (April 05, 2010 8:35 AM)  O2 Flow:  Room air CC: CPX/pt is no longer taking Allopurinol, Furosemide, or Metoprolol/aj Is Patient Diabetic? Yes   Primary Provider:  Minus Breeding MD  CC:  CPX/pt is no longer taking Allopurinol, Furosemide, and or Metoprolol/aj.  History of Present Illness: here for regular wellness examination.  He's feeling pretty well in general, and does not drink or smoke.  Current Medications (verified): 1)  Adult Aspirin Low Strength 81 Mg  Tbdp (Aspirin) .... Take 1 By Mouth Qd 2)  Bd Insulin Syringe 27.5g X 5/8" 2 Ml Misc (Insulin Syringe-Needle U-100) .... Once A Day 3)  Cialis 20 Mg  Tabs (Tadalafil) .... Prn 4)  Allopurinol 300 Mg  Tabs (Allopurinol) .... Qd 5)  Furosemide 40 Mg Tabs (Furosemide) .Marland Kitchen.. 1 Qd 6)  Metoprolol Succinate 25 Mg Xr24h-Tab (Metoprolol Succinate) .Marland Kitchen.. 1 Qd 7)  Simvastatin 80 Mg Tabs (Simvastatin) .Marland Kitchen.. 1 Qhs 8)  Levemir 100 Unit/ml Soln (Insulin Detemir) .... 30 Units Each Am  Allergies (verified): 1)  ! Penicillin 2)  ! Corky Crafts  Family History: Reviewed history from 06/15/2008 and no changes required. no cancer Family History of CAD Male 1st degree relative <50 Family History of Sudden Death  Social History: Reviewed history from 06/15/2008 and no changes required. 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  The patient denies fever, decreased hearing, syncope, prolonged cough, headaches, abdominal pain,  melena, hematochezia, severe indigestion/heartburn, hematuria, suspicious skin lesions, and depression.    Physical Exam  General:  normal appearance.   Head:  head: no deformity eyes: no periorbital swelling, no proptosis external nose and ears are normal mouth: no lesion seen Neck:  Supple without thyroid enlargement or tenderness.  Lungs:  Clear to auscultation bilaterally. Normal respiratory effort.  Heart:  Regular rate and rhythm without murmurs or gallops noted. Normal S1,S2.   Abdomen:  abdomen is soft, nontender.  no hepatosplenomegaly.   not distended.  no hernia  Rectal:  normal external and internal exam.  heme neg  Prostate:  Normal size prostate without masses or tenderness.  Msk:  muscle bulk and strength are grossly normal.  no obvious joint swelling.  gait is normal and steady  Neurologic:  cn 2-12 grossly intact.   readily moves all 4's.    Skin:  normal texture and temp.  no rash.  not diaphoretic  Cervical Nodes:  No significant adenopathy.  Psych:  Alert and cooperative; normal mood and affect; normal attention span and concentration.   Additional Exam:  SEPARATE EVALUATION FOLLOWS--EACH PROBLEM HERE IS NEW, NOT RESPONDING TO TREATMENT, OR POSES SIGNIFICANT RISK TO THE PATIENT'S HEALTH: HISTORY OF THE PRESENT ILLNESS: the status of at least 3 ongoing medical problems is addressed today: pt says he  cannot afford his insulin.  no cbg record, but states cbg's are high.  no hypoglycemic sxs. mild hyperkalemia is noted.  it was borderline high in the past.  he denies cramps pt says he sometimes misses his zocor.  no chest pain PAST MEDICAL HISTORY reviewed and up to date today REVIEW OF SYSTEMS: he has intermittent blurry vision.  denies sob PHYSICAL EXAMINATION: no deformity.  no ulcer on the feet.  feet are of normal color and temp.  no edema.  there are mycotic toenails. dorsalis pedis intact bilat.  no carotid bruit sensation is intact to touch on the  feet LAB/XRAY RESULTS: Hemoglobin A1C       [H]  16.8 %                      4.6-6.5 Potassium            [H]  5.6 mEq/L    Cholesterol LDL             148.7 mg/dL IMPRESSION: hyperkalemia, uncertain etiology dm, therapy limited by noncompliance.  i'll do the best i can. dyslipidemia, therapy limited by noncompliance.  i'll do the best i can. PLAN: see instruction page   Impression & Recommendations:  Problem # 1:  ROUTINE GENERAL MEDICAL EXAM@HEALTH  CARE FACL (ICD-V70.0)  Medications Added to Medication List This Visit: 1)  Humulin N 100 Unit/ml Susp (Insulin isophane human) .... 30 units each am, and syringes once daily  Other Orders: EKG w/ Interpretation (93000) Est. Patient Level IV (16109) Est. Patient 40-64 years ([address]454)   Patient Instructions: 1)  change your insulin to humulin nph, 30 units each am. 2)  Please schedule a follow-up appointment in 1 month. 3)  always take meds as prescribed. 4)  good diet and exercise habits significanly improve the control of your diabetes.  please let me know if you wish to be referred to a dietician.  high blood sugar is very risky to your health.  you should see an eye doctor every year. 5)  controlling your blood pressure and cholesterol drastically reduces the damage diabetes does to your body.  this also applies to quitting smoking.  please discuss these with your doctor.  you should take an aspirin every day, unless you have been advised by a doctor not to. 6)  please consider these measures for your health:  minimize alcohol.  do not use tobacco products.  have a colonoscopy at least every 10 years from age 74.  keep firearms safely stored.  always use seat belts.  have working smoke alarms in your home.  see an eye doctor and dentist regularly.  never drive under the influence of alcohol or drugs (including prescription drugs).   Prescriptions: HUMULIN N 100 UNIT/ML SUSP (INSULIN ISOPHANE HUMAN) 30 units each am, and syringes once  daily  #3 vials x 3   Entered and Authorized by:   Minus Breeding MD   Signed by:   Minus Breeding MD on 04/05/2010   Method used:   Electronically to        [facility] [phone]* (retail)       [address]       [place], [place]  [address]       Ph: [phone]       Fax: [phone]   RxID:   [id number]    Orders Added: 1)  EKG w/ Interpretation [93000] 2)  Est. Patient Level IV [36644] 3)  Est. Patient 40-64 years [phone]

## 2010-05-10 ENCOUNTER — Ambulatory Visit: Payer: BC Managed Care – PPO | Admitting: Endocrinology

## 2010-06-02 LAB — GLUCOSE, CAPILLARY
Glucose-Capillary: 241 mg/dL — ABNORMAL HIGH (ref 70–99)
Glucose-Capillary: 294 mg/dL — ABNORMAL HIGH (ref 70–99)

## 2010-06-02 LAB — BASIC METABOLIC PANEL
CO2: 25 mEq/L (ref 19–32)
Chloride: 104 mEq/L (ref 96–112)
Creatinine, Ser: 0.69 mg/dL (ref 0.4–1.5)
GFR calc Af Amer: >60 mL/min (ref >60)
Glucose, Bld: 241 mg/dL — ABNORMAL HIGH (ref 70–99)

## 2010-06-02 LAB — POCT HEMOGLOBIN-HEMACUE: Hemoglobin: 15.8 g/dL (ref 13.0–17.0)

## 2010-06-08 LAB — COMPREHENSIVE METABOLIC PANEL
AST: 15 U/L (ref 0–37)
BUN: 10 mg/dL (ref 6–23)
CO2: 19 mEq/L (ref 19–32)
Calcium: 8.6 mg/dL (ref 8.4–10.5)
Creatinine, Ser: 0.88 mg/dL (ref 0.4–1.5)
GFR calc Af Amer: >60 mL/min (ref >60)
GFR calc non Af Amer: >60 mL/min (ref >60)
Glucose, Bld: 67 mg/dL — ABNORMAL LOW (ref 70–99)
Total Bilirubin: 0.3 mg/dL (ref 0.3–1.2)

## 2010-06-08 LAB — URINALYSIS, ROUTINE W REFLEX MICROSCOPIC
Bilirubin Urine: NEGATIVE
Glucose, UA: NEGATIVE mg/dL
Ketones, ur: NEGATIVE mg/dL
Leukocytes, UA: NEGATIVE
Specific Gravity, Urine: 1.012 (ref 1.005–1.030)
pH: 6 (ref 5.0–8.0)

## 2010-06-08 LAB — CARDIAC PANEL(CRET KIN+CKTOT+MB+TROPI)
CK, MB: 0.9 ng/mL (ref 0.3–4.0)
Relative Index: 0.7 (ref 0.0–2.5)
Total CK: 121 U/L (ref 7–232)
Total CK: 197 U/L (ref 7–232)
Total CK: 226 U/L (ref 7–232)
Troponin I: <0.01 ng/mL (ref 0.00–0.06)
Troponin I: <0.01 ng/mL (ref 0.00–0.06)

## 2010-06-08 LAB — D-DIMER, QUANTITATIVE: D-Dimer, Quant: 0.46 ug/mL-FEU (ref 0.00–0.48)

## 2010-06-08 LAB — URINE MICROSCOPIC-ADD ON

## 2010-06-08 LAB — CBC
Hemoglobin: 11.8 g/dL — ABNORMAL LOW (ref 13.0–17.0)
MCV: 82.8 fL (ref 78.0–100.0)
RBC: 4.15 MIL/uL — ABNORMAL LOW (ref 4.22–5.81)
WBC: 8.3 10*3/uL (ref 4.0–10.5)

## 2010-06-08 LAB — GLUCOSE, CAPILLARY: Glucose-Capillary: 88 mg/dL (ref 70–99)

## 2010-06-08 LAB — BRAIN NATRIURETIC PEPTIDE: Pro B Natriuretic peptide (BNP): 341 pg/mL — ABNORMAL HIGH (ref 0.0–100.0)

## 2010-06-20 ENCOUNTER — Other Ambulatory Visit: Payer: Self-pay | Admitting: Endocrinology

## 2010-06-20 MED ORDER — SIMVASTATIN 80 MG PO TABS: 80.0000 mg | ORAL_TABLET | Freq: Every evening | ORAL | Status: DC

## 2010-06-20 NOTE — Telephone Encounter (Signed)
R'cd fax from [facility] for pt's Simvasatin   Last OV-04/05/2010  Last Filled-11/30/2009

## 2010-07-12 NOTE — Discharge Summary (Signed)
NAMEOLEN, EAVES           ACCOUNT NO.:  [id number]   MEDICAL RECORD NO.:  [id number]          PATIENT TYPE:  INP   LOCATION:  3729                         FACILITY:  [facility]   PHYSICIAN:  Valerie A. Felicity Coyer, MDDATE OF BIRTH:  03/22/1955   DATE OF ADMISSION:  06/15/2008  DATE OF DISCHARGE:  06/16/2008                               DISCHARGE SUMMARY   DISCHARGE DIAGNOSES:  1. Chest tightness, question anginal equivalent, status post adenosine      Cardiolite, negative for ischemia.  2. Acute heart failure, presumed systolic echocardiogram pending at      time of evaluation.  Chest x-ray changes with edema, status post IV      Lasix with improvement in symptoms.  Close outpatient followup.      See details below.  3. Type 2 diabetes, insulin dependent, excellent inpatient control.      Outpatient followup.  4. Dyslipidemia.  Continue home statin treatment.  5. Hypertension, previously on treatment.  Close outpatient followup      with primary MD to consider other alternative treatment especially      in the setting of other cardiac risk factors.  6. Microscopic hematuria on incidental UA with 11-20 rbc's.  Recheck      and follow up with primary care physician end of this week.  7. History of gout.  8. Edema.  Suspect related to problem #2 above, resolved overnight      with IV diuresis.  No complaints at this time.   DISCHARGE MEDICATIONS:  1. Lasix 40 mg once daily.  2. Potassium chloride 10 mEq once daily with Lasix.   OTHER MEDICATIONS ARE AS PRIOR TO ADMISSION:  1. Insulin 70/30 25 units subcu nightly.  2. Vytorin 10/80 p.o. nightly.  3. Allopurinol 300 mg p.o. daily.   DISPOSITION:  The patient is discharged home and medically stable and  improved condition.  He has had no further swelling, cough, shortness of  breath, or other complaints during this time.  Wife is present at  bedside and he is anxious for discharge home.  Hospital followup is with  primary care  physician Dr. Romero Belling, for the end of this week,  Friday, June 19, 2008 at 10:30 a.m.  At this visit, we will reevaluate  final results of 2-D echo performed during this hospitalization as well  as recheck labs to monitor tolerance of diuretics and potassium  supplement needs.  We would also recheck urinalysis or consider Urology  evaluation if persisting microscopic hematuria as needed.   CONDITION ON DISCHARGE:  Hemodynamically stable, asymptomatic, and  anxious for discharge home.   HOSPITAL COURSE BY PROBLEM:  1. Edema.  The patient is a 55 year old gentleman with multiple      cardiac risk factors who came to primary care physician's office as      a work in due to complaints of chest tightness with 1-week history      shortness of breath, dyspnea on exertion as well as swelling.  This      was felt to be concerning for an anginal equivalent and a diabetic  with strong family history of premature coronary artery disease.      So, he was admitted to [facility] on telemetry for further      evaluation.  Serial cardiac enzymes were cycled and fortunately      negative.  He was also treated with IV Lasix due to findings of      pulmonary edema on his chest x-ray.  With IV diuresis, the patient      felt symptomatically much improved.  He was anxious for discharge      home.  He underwent an adenosine Cardiolite, which was negative for      ischemia during this hospitalization and a 2-D echo has been      performed, but not yet read at time of dictation due to the      patient's marked improvement in symptoms and reliability in      followup.  He is felt safe at this time, stable for discharge home      to continue diuretic therapy with empiric potassium replacement and      close outpatient followup on his symptoms at home for what appears      to be acute systolic heart failure.  Continue his other medical      management of coexisting diabetes and dyslipidemia as  ongoing prior      to admission and further adjustment of medications with referral as      appropriate, will be deferred to primary MD.  For other chronic      medical issues, please see list above for these diagnoses and      management as described.  Over 30 minutes on day of discharge      coordination for the patient exam, review of data, evaluation, and      establishment of followup.      Valerie A. Felicity Coyer, MD  Electronically Signed     VAL/MEDQ  D:  06/16/2008  T:  06/17/2008  Job:  [id number]

## 2010-08-26 ENCOUNTER — Encounter: Payer: Self-pay | Admitting: Endocrinology

## 2010-08-26 ENCOUNTER — Ambulatory Visit (INDEPENDENT_AMBULATORY_CARE_PROVIDER_SITE_OTHER): Payer: BC Managed Care – PPO | Admitting: Endocrinology

## 2010-08-26 DIAGNOSIS — E119 Type 2 diabetes mellitus without complications: Secondary | ICD-10-CM

## 2010-08-26 NOTE — Patient Instructions (Addendum)
Increase insulin to 60 units each morning.  Then increase to 65 units each morning, if blood sugar stays high. Please make a follow-up appointment in 1 month. good diet and exercise habits significanly improve the control of your diabetes.  please let me know if you wish to be referred to a dietician.  high blood sugar is very risky to your health.  you should see an eye doctor every year. controlling your blood pressure and cholesterol drastically reduces the damage diabetes does to your body.  this also applies to quitting smoking.  please discuss these with your doctor.  you should take an aspirin every day, unless you have been advised by a doctor not to. check your blood sugar 2 times a day.  vary the time of day when you check, between before the 3 meals, and at bedtime.  also check if you have symptoms of your blood sugar being too high or too low.  please keep a record of the readings and bring it to your next appointment here.  please call us sooner if you are having low blood sugar episodes, or if it stays over 200.

## 2010-08-26 NOTE — Progress Notes (Signed)
  Subjective:    Patient ID: Aaron Fox, male    DOB: [birth year], 55 y.o.   MRN: [id number]  HPI Pt increased his nph insulin to 50 units qam.  Despite this increase, cbg's continue to be 200-300.  pt states he feels well in general, except for blurry vision.    Review of Systems denies hypoglycemia    Objective:   Physical Exam GENERAL: no distress Pulses: dorsalis pedis intact bilat.   Feet: no deformity.  no ulcer on the feet.  feet are of normal color and temp.  no edema Neuro: sensation is intact to touch on the feet        Assessment & Plan:

## 2010-09-23 ENCOUNTER — Encounter: Payer: Self-pay | Admitting: Endocrinology

## 2010-09-23 ENCOUNTER — Ambulatory Visit (INDEPENDENT_AMBULATORY_CARE_PROVIDER_SITE_OTHER): Payer: BC Managed Care – PPO | Admitting: Endocrinology

## 2010-09-23 DIAGNOSIS — E119 Type 2 diabetes mellitus without complications: Secondary | ICD-10-CM

## 2010-09-23 MED ORDER — VENLAFAXINE HCL ER 75 MG PO CP24: 75.0000 mg | ORAL_CAPSULE | Freq: Every day | ORAL | Status: DC

## 2010-09-23 NOTE — Progress Notes (Signed)
  Subjective:    Patient ID: Aaron Fox, male    DOB: [birth year], 55 y.o.   MRN: [id number]  HPI Pt takes only 50 units of nph insulin each am.  no cbg record, but states cbg's are "a little high."  He says his meter is not working.  Pt states 1 year of moderate pain at the legs, and assoc numbness.   Past Medical History  Diagnosis Date  . DIABETES MELLITUS, TYPE II 09/22/2006  . HYPOGONADISM, MALE 01/15/2007  . HYPERCHOLESTEROLEMIA 07/02/2008  . GOUT 09/22/2006  . HYPERTENSION 05/31/2009  . CHF 06/19/2008  . ALLERGIC RHINITIS 07/02/2008  . 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 Rheumatoid arthritis 09/22/2006  . COUGH DUE TO ACE INHIBITORS 10/05/2008  . Glaucoma   . Atrial fibrillation   . ED (erectile dysfunction)   . PUD (peptic ulcer disease)     Past Surgical History  Procedure Date  . Spine surgery     Lumbar Spine Surgery  . Ankle fusion     left  . Lower arterial 02/22/2005  . Ct sinus ltd w/o cm 04/06/1997  . Electrocardiogram 09/29/2005    History   Social History  . Marital Status: Married    Spouse Name: N/A    Number of Children: N/A  . Years of Education: N/A   Occupational History  . 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 Smoking status: Former Smoker    Quit date: 02/28/2000  . Smokeless tobacco: Not on file  . Alcohol Use: No  . Drug Use: No  . Sexually Active: Not on file   Other Topics Concern  . Not on file   Social History Narrative   Regular exercise-yes    Current Outpatient Prescriptions on File Prior to Visit  Medication Sig Dispense Refill  . aspirin 81 MG tablet Take 81 mg by mouth daily.        . insulin NPH (HUMULIN N) 100 UNIT/ML injection Inject 60 Units into the skin every morning.       . Insulin Syringe-Needle U-100 (B-D INSULIN SYRINGE 2CC/27.5G) 27.5G X 5/8" 2 ML MISC Use as directed once daily       . simvastatin (ZOCOR) 80 MG tablet Take 1 tablet (80 mg total) by mouth every evening.   30 tablet  5  . tadalafil (CIALIS) 20 MG tablet Take 20 mg by mouth as needed.          Allergies  Allergen Reactions  . Lamisil   . Penicillins     Family History  Problem Relation Age of Onset  . Cancer Neg Hx   . Heart disease Other     FH of CAD    BP 132/82  Pulse 77  Temp(Src) 98.4 F (36.9 C) (Oral)  Ht 5\' 11"  (1.803 m)  Wt 206 lb 6.4 oz (93.622 kg)  BMI 28.79 kg/m2  SpO2 97%  Review of Systems denies hypoglycemia and loc    Objective:   Physical Exam Pulses: dorsalis pedis intact bilat.   Feet: no deformity.  no ulcer on the feet.  feet are of normal color and temp.  no edema Neuro: sensation is intact to touch on the feet, but decreased from normal.    Assessment & Plan:  Dm, therapy limited by noncompliance.  i'll do the best i can. Painful neuropathy, worse.

## 2010-09-23 NOTE — Patient Instructions (Addendum)
Increase insulin to 60 units each morning.   Please make a follow-up appointment in 1 month. check your blood sugar 2 times a day.  vary the time of day when you check, between before the 3 meals, and at bedtime.  also check if you have symptoms of your blood sugar being too high or too low.  please keep a record of the readings and bring it to your next appointment here.  please call us sooner if you are having low blood sugar episodes, or if it stays over 200.   Add effexor-xr 75 mg daily.  This will help your pain.

## 2010-11-15 ENCOUNTER — Ambulatory Visit: Payer: BC Managed Care – PPO | Admitting: Endocrinology

## 2011-01-25 ENCOUNTER — Other Ambulatory Visit: Payer: Self-pay | Admitting: Endocrinology

## 2011-07-04 ENCOUNTER — Ambulatory Visit: Payer: Self-pay | Admitting: Endocrinology

## 2011-11-09 ENCOUNTER — Ambulatory Visit: Payer: Self-pay | Admitting: Endocrinology

## 2011-11-13 ENCOUNTER — Encounter: Payer: Self-pay | Admitting: Endocrinology

## 2011-11-13 ENCOUNTER — Ambulatory Visit (INDEPENDENT_AMBULATORY_CARE_PROVIDER_SITE_OTHER): Payer: PRIVATE HEALTH INSURANCE | Admitting: Endocrinology

## 2011-11-13 ENCOUNTER — Other Ambulatory Visit (INDEPENDENT_AMBULATORY_CARE_PROVIDER_SITE_OTHER): Payer: PRIVATE HEALTH INSURANCE

## 2011-11-13 ENCOUNTER — Telehealth: Payer: Self-pay | Admitting: *Deleted

## 2011-11-13 VITALS — BP 124/68 | HR 84 | Temp 97.1°F | Ht 71.0 in | Wt 209.0 lb

## 2011-11-13 DIAGNOSIS — M109 Gout, unspecified: Secondary | ICD-10-CM

## 2011-11-13 DIAGNOSIS — Z125 Encounter for screening for malignant neoplasm of prostate: Secondary | ICD-10-CM

## 2011-11-13 DIAGNOSIS — R209 Unspecified disturbances of skin sensation: Secondary | ICD-10-CM

## 2011-11-13 DIAGNOSIS — Z79899 Other long term (current) drug therapy: Secondary | ICD-10-CM

## 2011-11-13 DIAGNOSIS — E7800 Pure hypercholesterolemia, unspecified: Secondary | ICD-10-CM

## 2011-11-13 DIAGNOSIS — I10 Essential (primary) hypertension: Secondary | ICD-10-CM

## 2011-11-13 DIAGNOSIS — E119 Type 2 diabetes mellitus without complications: Secondary | ICD-10-CM

## 2011-11-13 DIAGNOSIS — M25511 Pain in right shoulder: Secondary | ICD-10-CM

## 2011-11-13 DIAGNOSIS — M25519 Pain in unspecified shoulder: Secondary | ICD-10-CM

## 2011-11-13 DIAGNOSIS — G622 Polyneuropathy due to other toxic agents: Secondary | ICD-10-CM | POA: Insufficient documentation

## 2011-11-13 DIAGNOSIS — E876 Hypokalemia: Secondary | ICD-10-CM

## 2011-11-13 DIAGNOSIS — G619 Inflammatory polyneuropathy, unspecified: Secondary | ICD-10-CM

## 2011-11-13 DIAGNOSIS — E291 Testicular hypofunction: Secondary | ICD-10-CM

## 2011-11-13 LAB — CBC WITH DIFFERENTIAL/PLATELET
Basophils Absolute: 0 10*3/uL (ref 0.0–0.1)
Eosinophils Relative: 4.2 % (ref 0.0–5.0)
MCV: 83 fl (ref 78.0–100.0)
Monocytes Absolute: 0.5 10*3/uL (ref 0.1–1.0)
Neutrophils Relative %: 44.7 % (ref 43.0–77.0)
Platelets: 173 10*3/uL (ref 150.0–400.0)
RDW: 13.1 % (ref 11.5–14.6)
WBC: 4 10*3/uL — ABNORMAL LOW (ref 4.5–10.5)

## 2011-11-13 LAB — HEPATIC FUNCTION PANEL
ALT: 20 U/L (ref 0–53)
AST: 21 U/L (ref 0–37)
Albumin: 3.6 g/dL (ref 3.5–5.2)
Total Bilirubin: 0.7 mg/dL (ref 0.3–1.2)

## 2011-11-13 LAB — URINALYSIS, ROUTINE W REFLEX MICROSCOPIC
Hgb urine dipstick: NEGATIVE
Leukocytes, UA: NEGATIVE
Nitrite: NEGATIVE
Total Protein, Urine: NEGATIVE
Urobilinogen, UA: 0.2 (ref 0.0–1.0)

## 2011-11-13 LAB — BASIC METABOLIC PANEL
Calcium: 9.5 mg/dL (ref 8.4–10.5)
Creatinine, Ser: 0.8 mg/dL (ref 0.4–1.5)
GFR: 132.61 mL/min (ref >60.00)
Glucose, Bld: 509 mg/dL (ref 70–99)
Sodium: 133 mEq/L — ABNORMAL LOW (ref 135–145)

## 2011-11-13 LAB — LIPID PANEL
LDL Cholesterol: 55 mg/dL (ref 0–99)
Total CHOL/HDL Ratio: 3
Triglycerides: 191 mg/dL — ABNORMAL HIGH (ref 0.0–149.0)

## 2011-11-13 LAB — MICROALBUMIN / CREATININE URINE RATIO: Microalb Creat Ratio: 11.3 mg/g (ref 0.0–30.0)

## 2011-11-13 LAB — TSH: TSH: 1.28 u[IU]/mL (ref 0.35–5.50)

## 2011-11-13 LAB — VITAMIN B12: Vitamin B-12: 283 pg/mL (ref 211–911)

## 2011-11-13 MED ORDER — INSULIN NPH (HUMAN) (ISOPHANE) 100 UNIT/ML ~~LOC~~ SUSP: 60.0000 [IU] | SUBCUTANEOUS | Status: DC

## 2011-11-13 MED ORDER — FLUOCINONIDE 0.05 % EX CREA: TOPICAL_CREAM | Freq: Every day | CUTANEOUS | Status: DC

## 2011-11-13 MED ORDER — GABAPENTIN 300 MG PO CAPS: 300.0000 mg | ORAL_CAPSULE | Freq: Every day | ORAL | Status: DC

## 2011-11-13 MED ORDER — FLUOCINONIDE 0.05 % EX CREA: TOPICAL_CREAM | Freq: Every day | CUTANEOUS | Status: AC

## 2011-11-13 MED ORDER — SIMVASTATIN 80 MG PO TABS: ORAL_TABLET | ORAL | Status: DC

## 2011-11-13 MED ORDER — GLUCOSE BLOOD VI STRP: 1.0000 | ORAL_STRIP | Freq: Two times a day (BID) | Status: DC

## 2011-11-13 NOTE — Telephone Encounter (Signed)
Lab called with critical-pt's glucose was 509.

## 2011-11-13 NOTE — Patient Instructions (Addendum)
blood tests are being requested for you today.  You will receive a letter with results. i have sent several prescription to your pharmacy. Please come in soon for a regular physical. in my opinion, you are completely and permanently disabled.  You should apply for disability.  Refer to an orthopedic specialist.  you will receive a phone call, about a day and time for an appointment.

## 2011-11-13 NOTE — Progress Notes (Signed)
Subjective:    Patient ID: Aaron Fox, male    DOB: April 04, 1955, 56 y.o.   MRN: [id number]  HPI Pt returns for f/u of insulin-requiring DM (dx'ed 2002; complicated by peripheral sensory neuropathy). Pt states 1 year of moderate rash of both hands, and assoc itching.    Past Medical History  Diagnosis Date  . DIABETES MELLITUS, TYPE II 09/22/2006  . HYPOGONADISM, MALE 01/15/2007  . HYPERCHOLESTEROLEMIA 07/02/2008  . GOUT 09/22/2006  . HYPERTENSION 05/31/2009  . CHF 06/19/2008  . ALLERGIC RHINITIS 07/02/2008  . 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 Rheumatoid arthritis 09/22/2006  . COUGH DUE TO ACE INHIBITORS 10/05/2008  . Glaucoma   . Atrial fibrillation   . ED (erectile dysfunction)   . PUD (peptic ulcer disease)     Past Surgical History  Procedure Date  . Spine surgery     Lumbar Spine Surgery  . Ankle fusion     left  . Lower arterial 02/22/2005  . Ct sinus ltd w/o cm 04/06/1997  . Electrocardiogram 09/29/2005    History   Social History  . Marital Status: Married    Spouse Name: N/A    Number of Children: N/A  . Years of Education: N/A   Occupational History  . 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 Smoking status: Former Smoker    Quit date: 02/28/2000  . Smokeless tobacco: Not on file  . Alcohol Use: No  . Drug Use: No  . Sexually Active: Not on file   Other Topics Concern  . Not on file   Social History Narrative   Regular exercise-yes    Current Outpatient Prescriptions on File Prior to Visit  Medication Sig Dispense Refill  . aspirin 81 MG tablet Take 81 mg by mouth daily.        . insulin NPH (HUMULIN N) 100 UNIT/ML injection Inject 60 Units into the skin every morning.  2 vial  11  . Insulin Syringe-Needle U-100 (B-D INSULIN SYRINGE 2CC/27.5G) 27.5G X 5/8" 2 ML MISC Use as directed once daily       . simvastatin (ZOCOR) 80 MG tablet TAKE 1 TABLET (80 MG TOTAL) BY MOUTH EVERY EVENING.  90 tablet  1  .  gabapentin (NEURONTIN) 300 MG capsule Take 1 capsule (300 mg total) by mouth at bedtime.  90 capsule  3  . tadalafil (CIALIS) 20 MG tablet Take 20 mg by mouth as needed.        . venlafaxine (EFFEXOR-XR) 75 MG 24 hr capsule Take 1 capsule (75 mg total) by mouth daily.  30 capsule  11    Allergies  Allergen Reactions  . Penicillins   . Terbinafine Hcl     Family History  Problem Relation Age of Onset  . Cancer Neg Hx   . Heart disease Other     FH of CAD   BP 124/68  Pulse 84  Temp 97.1 F (36.2 C) (Oral)  Ht 5\' 11"  (1.803 m)  Wt 209 lb (94.802 kg)  BMI 29.15 kg/m2  SpO2 96%  Review of Systems He has numbness and pain of his feet.  He has right shoulder pain.      Objective:   Physical Exam VITAL SIGNS:  See vs page. GENERAL: no distress. Neuro: sensation is intact to touch on the hands.   Right shoulder: full rom, but rom is painful.   Pulses: dorsalis pedis intact bilat.   Feet: no  deformity.  no ulcer on the feet.  feet are of normal color and temp.  no edema Neuro: sensation is intact to touch on the feet, but severely decreased from normal.  Lab Results  Component Value Date   HGBA1C 12.9* 11/13/2011   (i reviewed pncv done on 03/05/09)    Assessment & Plan:  DM, poor control, usually due to noncompliance Peripheral neuropathy.  He is disabled for this reason Dyshidrosis, new

## 2011-11-28 ENCOUNTER — Other Ambulatory Visit: Payer: Self-pay | Admitting: Endocrinology

## 2011-11-28 ENCOUNTER — Ambulatory Visit (INDEPENDENT_AMBULATORY_CARE_PROVIDER_SITE_OTHER): Payer: PRIVATE HEALTH INSURANCE | Admitting: Endocrinology

## 2011-11-28 ENCOUNTER — Encounter: Payer: Self-pay | Admitting: Endocrinology

## 2011-11-28 VITALS — BP 126/78 | HR 89 | Temp 98.0°F | Resp 16 | Ht 72.0 in | Wt 205.6 lb

## 2011-11-28 DIAGNOSIS — E119 Type 2 diabetes mellitus without complications: Secondary | ICD-10-CM

## 2011-11-28 MED ORDER — CLOMIPHENE CITRATE 50 MG PO TABS: ORAL_TABLET | ORAL | Status: DC

## 2011-11-28 NOTE — Telephone Encounter (Signed)
Pt is requesting Rx refill of Zocor. Under meds in comments section it states that there are no refills for this med. Please advise.

## 2011-11-28 NOTE — Progress Notes (Signed)
Subjective:    Patient ID: Aaron Fox, male    DOB: November 20, 1955, 56 y.o.   MRN: [id number]  HPI here for regular wellness examination.  He's feeling pretty well in general, and says chronic med probs are stable, except as noted below Past Medical History  Diagnosis Date  . DIABETES MELLITUS, TYPE II 09/22/2006  . HYPOGONADISM, MALE 01/15/2007  . HYPERCHOLESTEROLEMIA 07/02/2008  . GOUT 09/22/2006  . HYPERTENSION 05/31/2009  . CHF 06/19/2008  . ALLERGIC RHINITIS 07/02/2008  . 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 Rheumatoid arthritis 09/22/2006  . COUGH DUE TO ACE INHIBITORS 10/05/2008  . Glaucoma   . Atrial fibrillation   . ED (erectile dysfunction)   . PUD (peptic ulcer disease)     Past Surgical History  Procedure Date  . Spine surgery     Lumbar Spine Surgery  . Ankle fusion     left  . Lower arterial 02/22/2005  . Ct sinus ltd w/o cm 04/06/1997  . Electrocardiogram 09/29/2005    History   Social History  . Marital Status: Married    Spouse Name: N/A    Number of Children: N/A  . Years of Education: N/A   Occupational History  . 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 Smoking status: Former Smoker    Quit date: 02/28/2000  . Smokeless tobacco: Not on file  . Alcohol Use: No  . Drug Use: No  . Sexually Active: Not on file   Other Topics Concern  . Not on file   Social History Narrative   Regular exercise-yes    Current Outpatient Prescriptions on File Prior to Visit  Medication Sig Dispense Refill  . aspirin 81 MG tablet Take 81 mg by mouth daily.        . fluocinonide cream (LIDEX) 0.05 % Apply topically at bedtime.  60 g  3  . gabapentin (NEURONTIN) 300 MG capsule Take 1 capsule (300 mg total) by mouth at bedtime.  90 capsule  3  . glucose blood (ONE TOUCH ULTRA TEST) test strip 1 each by Other route 2 (two) times daily. And lancets 2/day 250.03  100 each  12  . Insulin Syringe-Needle U-100 (B-D INSULIN SYRINGE  2CC/27.5G) 27.5G X 5/8" 2 ML MISC Use as directed once daily       . simvastatin (ZOCOR) 80 MG tablet TAKE 1 TABLET (80 MG TOTAL) BY MOUTH EVERY EVENING.  90 tablet  1  . tadalafil (CIALIS) 20 MG tablet Take 20 mg by mouth as needed.        Marland Kitchen DISCONTD: insulin NPH (HUMULIN N) 100 UNIT/ML injection Inject 60 Units into the skin every morning.  2 vial  11  . venlafaxine (EFFEXOR-XR) 75 MG 24 hr capsule Take 1 capsule (75 mg total) by mouth daily.  30 capsule  11    Allergies  Allergen Reactions  . Penicillins   . Terbinafine Hcl     Family History  Problem Relation Age of Onset  . Cancer Neg Hx   . Heart disease Other     FH of CAD    BP 126/78  Pulse 89  Temp 98 F (36.7 C) (Oral)  Resp 16  Ht 6' (1.829 m)  Wt 205 lb 9 oz (93.243 kg)  BMI 27.88 kg/m2  SpO2 98%  Review of Systems  Constitutional: Negative for fever and unexpected weight change.  HENT: Negative for hearing loss.   Eyes: Negative for  visual disturbance.  Respiratory: Negative for shortness of breath.   Cardiovascular: Negative for chest pain.  Gastrointestinal: Negative for anal bleeding.  Genitourinary: Negative for hematuria and difficulty urinating.  Musculoskeletal: Negative for back pain.  Skin: Negative for rash.  Neurological: Negative for syncope and headaches.  Hematological: Does not bruise/bleed easily.  Psychiatric/Behavioral: Negative for dysphoric mood.       Objective:   Physical Exam VS: see vs page GEN: no distress HEAD: head: no deformity eyes: no periorbital swelling, no proptosis external nose and ears are normal mouth: no lesion seen NECK: supple, thyroid is not enlarged CHEST WALL: no deformity LUNGS: clear to auscultation BREASTS:  No gynecomastia CV: reg rate and rhythm, no murmur ABD: abdomen is soft, nontender.  no hepatosplenomegaly.  not distended.  no hernia RECTAL: normal external and internal exam.  heme neg. PROSTATE:  Normal size.  No nodule MUSCULOSKELETAL:  muscle bulk and strength are grossly normal.  no obvious joint swelling.  gait is normal and steady PULSES: no carotid bruit NEURO:  cn 2-12 grossly intact.   readily moves all 4's.   SKIN:  Normal texture and temperature.  No rash or suspicious lesion is visible.   NODES:  None palpable at the neck PSYCH: alert, oriented x3.  Does not appear anxious nor depressed.    ecg was not done, because machine not available    Assessment & Plan:  Wellness visit today, with problems stable, except as noted.   SEPARATE EVALUATION FOLLOWS--EACH PROBLEM HERE IS NEW, NOT RESPONDING TO TREATMENT, OR POSES SIGNIFICANT RISK TO THE PATIENT'S HEALTH: HISTORY OF THE PRESENT ILLNESS: Pt returns for f/u of insulin-requiring DM (dx'ed 2002; complicated by peripheral sensory neuropathy; therapy limited by pt's need for a simple, inexpensive regimen).  He says cbg's are still "high."  denies hypoglycemia. PAST MEDICAL HISTORY reviewed and up to date today REVIEW OF SYSTEMS: No change in chronic numbness of the feet PHYSICAL EXAMINATION: VITAL SIGNS:  See vs page GENERAL: no distress Pulses: dorsalis pedis intact bilat.   Feet: no deformity.  no ulcer on the feet.  feet are of normal color and temp.  no edema.  There is bilateral onychomycosis. Neuro: sensation is intact to touch on the feet, but severely decreased from normal. LAB/XRAY RESULTS: Lab Results  Component Value Date   HGBA1C 12.9* 11/13/2011  IMPRESSION: DM, needs increased rx PLAN: See instruction page

## 2011-11-28 NOTE — Patient Instructions (Addendum)
Increase the insulin to 70 units every morning.  Please call if your blood sugar stays high. Resume the "clomiphine" medication, to raise the testosterone.  i have sent a prescription to your pharmacy.  please consider these measures for your health:  minimize alcohol.  do not use tobacco products.  have a colonoscopy at least every 10 years from age 55.  keep firearms safely stored.  always use seat belts.  have working smoke alarms in your home.  see an eye doctor and dentist regularly.  never drive under the influence of alcohol or drugs (including prescription drugs).  Please come back for a follow-up appointment in 3 months.

## 2011-12-29 ENCOUNTER — Ambulatory Visit: Payer: PRIVATE HEALTH INSURANCE | Admitting: Internal Medicine

## 2012-03-12 ENCOUNTER — Ambulatory Visit (INDEPENDENT_AMBULATORY_CARE_PROVIDER_SITE_OTHER): Payer: BC Managed Care – PPO | Admitting: Endocrinology

## 2012-03-12 VITALS — BP 122/80 | HR 76 | Temp 98.8°F | Wt 217.0 lb

## 2012-03-12 DIAGNOSIS — E119 Type 2 diabetes mellitus without complications: Secondary | ICD-10-CM

## 2012-03-12 DIAGNOSIS — E291 Testicular hypofunction: Secondary | ICD-10-CM

## 2012-03-12 LAB — HEMOGLOBIN A1C: Hgb A1c MFr Bld: 13.7 % — ABNORMAL HIGH (ref 4.6–6.5)

## 2012-03-12 MED ORDER — GABAPENTIN 300 MG PO CAPS: 300.0000 mg | ORAL_CAPSULE | Freq: Three times a day (TID) | ORAL | Status: DC

## 2012-03-12 NOTE — Addendum Note (Signed)
Addended by: Aubery Lapping on: 03/12/2012 08:20 AM   Modules accepted: Orders

## 2012-03-12 NOTE — Progress Notes (Signed)
Subjective:    Patient ID: Aaron Fox, male    DOB: Jul 04, 1955, 57 y.o.   MRN: [id number]  HPIThe state of at least three ongoing medical problems is addressed today, with interval history of each noted here: Pt returns for f/u of insulin-requiring DM (dx'ed 2002; complicated by peripheral sensory neuropathy; therapy limited by pt's need for a simple, inexpensive regimen).  He says cbg's have improved overall.  He has mild hypoglycemia in the early am.  He therefore decreased the insulin to 60 units qam.  He says he never misses the insulin.   Painful neuropathy: foot pain persists Hypogonadism: Ed persists despite clomid Past Medical History  Diagnosis Date  . DIABETES MELLITUS, TYPE II 09/22/2006  . HYPOGONADISM, MALE 01/15/2007  . HYPERCHOLESTEROLEMIA 07/02/2008  . GOUT 09/22/2006  . HYPERTENSION 05/31/2009  . CHF 06/19/2008  . ALLERGIC RHINITIS 07/02/2008  . 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 Rheumatoid arthritis 09/22/2006  . COUGH DUE TO ACE INHIBITORS 10/05/2008  . Glaucoma   . Atrial fibrillation   . ED (erectile dysfunction)   . PUD (peptic ulcer disease)     Past Surgical History  Procedure Date  . Spine surgery     Lumbar Spine Surgery  . Ankle fusion     left  . Lower arterial 02/22/2005  . Ct sinus ltd w/o cm 04/06/1997  . Electrocardiogram 09/29/2005    History   Social History  . Marital Status: Married    Spouse Name: N/A    Number of Children: N/A  . Years of Education: N/A   Occupational History  . 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 Smoking status: Former Smoker    Quit date: 02/28/2000  . Smokeless tobacco: Not on file  . Alcohol Use: No  . Drug Use: No  . Sexually Active: Not on file   Other Topics Concern  . Not on file   Social History Narrative   Regular exercise-yes    Current Outpatient Prescriptions on File Prior to Visit  Medication Sig Dispense Refill  . aspirin 81 MG tablet Take 81 mg  by mouth daily.        . clomiPHENE (CLOMID) 50 MG tablet 1/4 tab daily  10 tablet  11  . fluocinonide cream (LIDEX) 0.05 % Apply topically at bedtime.  60 g  3  . gabapentin (NEURONTIN) 300 MG capsule Take 1 capsule (300 mg total) by mouth 3 (three) times daily.  90 capsule  11  . glucose blood (ONE TOUCH ULTRA TEST) test strip 1 each by Other route 2 (two) times daily. And lancets 2/day 250.03  100 each  12  . insulin NPH (HUMULIN N,NOVOLIN N) 100 UNIT/ML injection Inject 70 Units into the skin every morning.      . Insulin Syringe-Needle U-100 (B-D INSULIN SYRINGE 2CC/27.5G) 27.5G X 5/8" 2 ML MISC Use as directed once daily       . simvastatin (ZOCOR) 80 MG tablet TAKE 1 TABLET (80 MG TOTAL) BY MOUTH EVERY EVENING.  90 tablet  1  . tadalafil (CIALIS) 20 MG tablet Take 20 mg by mouth as needed.        . venlafaxine (EFFEXOR-XR) 75 MG 24 hr capsule Take 1 capsule (75 mg total) by mouth daily.  30 capsule  11    Allergies  Allergen Reactions  . Penicillins   . Terbinafine Hcl     Family History  Problem Relation Age of  Onset  . Cancer Neg Hx   . Heart disease Other     FH of CAD    BP 122/80  Pulse 76  Temp 98.8 F (37.1 C) (Oral)  Wt 217 lb (98.431 kg)  SpO2 98%   Review of Systems Denies LOC. Denies decreased urinary stream    Objective:   Physical Exam VITAL SIGNS:  See vs page GENERAL: no distress SKIN:  Insulin injection sites at the anterior abdomen are normal      Assessment & Plan:  DM, therapy limited by pt's need for a simple, inexpensive regimen. Hypogonadism, uncertain control Peripheral neuropathy, needs increased rx

## 2012-03-12 NOTE — Patient Instructions (Addendum)
blood tests are being requested for you today.  We'll contact you with results. check your blood sugar twice a day.  vary the time of day when you check, between before the 3 meals, and at bedtime.  also check if you have symptoms of your blood sugar being too high or too low.  please keep a record of the readings and bring it to your next appointment here.  please call us sooner if your blood sugar goes below 70, or if you have a lot of readings over 200. Please come back for a follow-up appointment in 3 months. 

## 2012-04-05 ENCOUNTER — Other Ambulatory Visit: Payer: Self-pay | Admitting: Endocrinology

## 2012-04-05 ENCOUNTER — Telehealth: Payer: Self-pay | Admitting: Endocrinology

## 2012-04-05 NOTE — Telephone Encounter (Signed)
Gout has flared back up again, would like script called in at [facility] / [place]. Patient's call back number [phone]

## 2012-04-05 NOTE — Telephone Encounter (Signed)
i can't see where pt has been on med for acute gouty flare. Please advise sat clinic

## 2012-04-05 NOTE — Telephone Encounter (Signed)
PATIENT REQUEST MEDICATION FOR GOUT FLARE UP. PLEASE ADVISE.

## 2012-04-08 NOTE — Telephone Encounter (Signed)
Pt advised and stated he would speak with receptionist for an appt.

## 2012-04-10 ENCOUNTER — Ambulatory Visit (INDEPENDENT_AMBULATORY_CARE_PROVIDER_SITE_OTHER): Payer: BC Managed Care – PPO | Admitting: Endocrinology

## 2012-04-10 VITALS — BP 124/78 | HR 90 | Wt 225.0 lb

## 2012-04-10 DIAGNOSIS — E119 Type 2 diabetes mellitus without complications: Secondary | ICD-10-CM

## 2012-04-10 MED ORDER — COLCHICINE 0.6 MG PO TABS: ORAL_TABLET | ORAL | Status: DC

## 2012-04-10 MED ORDER — ALLOPURINOL 300 MG PO TABS: 300.0000 mg | ORAL_TABLET | Freq: Every day | ORAL | Status: DC

## 2012-04-10 NOTE — Progress Notes (Signed)
Subjective:    Patient ID: Aaron Fox, male    DOB: [birth year], 56 y.o.   MRN: [id number]  HPI Pt returns for f/u of insulin-requiring DM (dx'ed 2002; complicated by peripheral sensory neuropathy; therapy limited by pt's need for a simple, inexpensive regimen).  no cbg record, but states cbg's are persistently 200-400.   Pt states 1 week of slight swelling of the right foot, and assoc pain.  sxs are much better now.  He is unable to cite precip factor such as local injury.  He thinks it was gout.  However, the gouty episodes he had in the past were not there.  He has been off allopurinol x a few years.   Past Medical History  Diagnosis Date  . DIABETES MELLITUS, TYPE II 09/22/2006  . HYPOGONADISM, MALE 01/15/2007  . HYPERCHOLESTEROLEMIA 07/02/2008  . GOUT 09/22/2006  . HYPERTENSION 05/31/2009  . CHF 06/19/2008  . ALLERGIC RHINITIS 07/02/2008  . 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 Rheumatoid arthritis 09/22/2006  . COUGH DUE TO ACE INHIBITORS 10/05/2008  . Glaucoma   . Atrial fibrillation   . ED (erectile dysfunction)   . PUD (peptic ulcer disease)     Past Surgical History  Procedure Laterality Date  . Spine surgery      Lumbar Spine Surgery  . Ankle fusion      left  . Lower arterial  02/22/2005  . Ct sinus ltd w/o cm  04/06/1997  . Electrocardiogram  09/29/2005    History   Social History  . Marital Status: Married    Spouse Name: N/A    Number of Children: N/A  . Years of Education: N/A   Occupational History  . 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 Smoking status: Former Smoker    Quit date: 02/28/2000  . Smokeless tobacco: Not on file  . Alcohol Use: No  . Drug Use: No  . Sexually Active: Not on file   Other Topics Concern  . Not on file   Social History Narrative   Regular exercise-yes    Current Outpatient Prescriptions on File Prior to Visit  Medication Sig Dispense Refill  . aspirin 81 MG tablet Take 81 mg by  mouth daily.        . clomiPHENE (CLOMID) 50 MG tablet 1/4 tab daily  10 tablet  11  . fluocinonide cream (LIDEX) 0.05 % Apply topically at bedtime.  60 g  3  . gabapentin (NEURONTIN) 300 MG capsule Take 1 capsule (300 mg total) by mouth 3 (three) times daily.  90 capsule  11  . glucose blood (ONE TOUCH ULTRA TEST) test strip 1 each by Other route 2 (two) times daily. And lancets 2/day 250.03  100 each  12  . insulin NPH (HUMULIN N,NOVOLIN N) 100 UNIT/ML injection Inject 80 Units into the skin every morning.       . Insulin Syringe-Needle U-100 (B-D INSULIN SYRINGE 2CC/27.5G) 27.5G X 5/8" 2 ML MISC Use as directed once daily       . simvastatin (ZOCOR) 80 MG tablet TAKE 1 TABLET (80 MG TOTAL) BY MOUTH EVERY EVENING.  90 tablet  1  . tadalafil (CIALIS) 20 MG tablet Take 20 mg by mouth as needed.        . venlafaxine (EFFEXOR-XR) 75 MG 24 hr capsule Take 1 capsule (75 mg total) by mouth daily.  30 capsule  11   No current facility-administered medications on file  prior to visit.    Allergies  Allergen Reactions  . Penicillins   . Terbinafine Hcl     Family History  Problem Relation Age of Onset  . Cancer Neg Hx   . Heart disease Other     FH of CAD    BP 124/78  Pulse 90  Wt 225 lb (102.059 kg)  BMI 30.51 kg/m2  SpO2 98%  Review of Systems Denies fever and hypoglycemia    Objective:   Physical Exam VITAL SIGNS:  See vs page GENERAL: no distress  Right foot: Pulses: dorsalis pedis intact. no deformity.  no ulcer.  normal color and temp.  no edema, but there is slight swelling at the lateral aspect Neuro: sensation is intact to touch.      Assessment & Plan:  Foot pain, new, ? Due to gout DM: needs increased rx

## 2012-04-10 NOTE — Patient Instructions (Addendum)
i'm glad your pain is better, but i'm not sure it is gout.  Please cal if the pain worsens again. i have sent a prescription to your pharmacy, for a pill against the gout.  Do not exceed 6 per day.  Skip 2 days of simvastatin if you take this.   In 2 weeks, resume the allopurinol.  i have sent a prescription to your pharmacy.   Please increase the insulin to 80 units each morning.   i'll see you next time.

## 2012-05-10 ENCOUNTER — Other Ambulatory Visit: Payer: Self-pay | Admitting: *Deleted

## 2012-05-10 MED ORDER — SIMVASTATIN 80 MG PO TABS: 80.0000 mg | ORAL_TABLET | Freq: Every day | ORAL | Status: DC

## 2012-06-18 ENCOUNTER — Encounter: Payer: Self-pay | Admitting: Endocrinology

## 2012-06-18 ENCOUNTER — Ambulatory Visit (INDEPENDENT_AMBULATORY_CARE_PROVIDER_SITE_OTHER): Payer: BC Managed Care – PPO | Admitting: Endocrinology

## 2012-06-18 VITALS — BP 126/80 | HR 78 | Wt 223.0 lb

## 2012-06-18 DIAGNOSIS — E119 Type 2 diabetes mellitus without complications: Secondary | ICD-10-CM

## 2012-06-18 MED ORDER — INSULIN GLARGINE 100 UNIT/ML ~~LOC~~ SOLN: 70.0000 [IU] | SUBCUTANEOUS | Status: DC

## 2012-06-18 NOTE — Progress Notes (Signed)
Subjective:    Patient ID: Aaron Fox, male    DOB: 1955-10-02, 57 y.o.   MRN: [id number]  HPI Pt returns for f/u of insulin-requiring DM (dx'ed 2002; complicated by peripheral sensory neuropathy; therapy limited by pt's need for a simple, inexpensive regimen; he has never had severe hypoglycemia or DKA).  He takes only 60 units of NPH per day.  He has no cbg record, but states cbg's are persistently 58-200.  He says it is lowest in the mid-morning, and highest in the evening.   Past Medical History  Diagnosis Date  . DIABETES MELLITUS, TYPE II 09/22/2006  . HYPOGONADISM, MALE 01/15/2007  . HYPERCHOLESTEROLEMIA 07/02/2008  . GOUT 09/22/2006  . HYPERTENSION 05/31/2009  . CHF 06/19/2008  . ALLERGIC RHINITIS 07/02/2008  . 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 Rheumatoid arthritis 09/22/2006  . COUGH DUE TO ACE INHIBITORS 10/05/2008  . Glaucoma   . Atrial fibrillation   . ED (erectile dysfunction)   . PUD (peptic ulcer disease)     Past Surgical History  Procedure Laterality Date  . Spine surgery      Lumbar Spine Surgery  . Ankle fusion      left  . Lower arterial  02/22/2005  . Ct sinus ltd w/o cm  04/06/1997  . Electrocardiogram  09/29/2005    History   Social History  . Marital Status: Married    Spouse Name: N/A    Number of Children: N/A  . Years of Education: N/A   Occupational History  . 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 Smoking status: Former Smoker    Quit date: 02/28/2000  . Smokeless tobacco: Not on file  . Alcohol Use: No  . Drug Use: No  . Sexually Active: Not on file   Other Topics Concern  . Not on file   Social History Narrative   Regular exercise-yes    Current Outpatient Prescriptions on File Prior to Visit  Medication Sig Dispense Refill  . allopurinol (ZYLOPRIM) 300 MG tablet Take 1 tablet (300 mg total) by mouth daily.  30 tablet  6  . aspirin 81 MG tablet Take 81 mg by mouth daily.        .  clomiPHENE (CLOMID) 50 MG tablet 1/4 tab daily  10 tablet  11  . colchicine 0.6 MG tablet 1 pill every hour until gout is better.  Stop if diarrhea  10 tablet  2  . fluocinonide cream (LIDEX) 0.05 % Apply topically at bedtime.  60 g  3  . gabapentin (NEURONTIN) 300 MG capsule Take 1 capsule (300 mg total) by mouth 3 (three) times daily.  90 capsule  11  . glucose blood (ONE TOUCH ULTRA TEST) test strip 1 each by Other route 2 (two) times daily. And lancets 2/day 250.03  100 each  12  . Insulin Syringe-Needle U-100 (B-D INSULIN SYRINGE 2CC/27.5G) 27.5G X 5/8" 2 ML MISC Use as directed once daily       . simvastatin (ZOCOR) 80 MG tablet Take 1 tablet (80 mg total) by mouth at bedtime.  90 tablet  2  . tadalafil (CIALIS) 20 MG tablet Take 20 mg by mouth as needed.        . venlafaxine (EFFEXOR-XR) 75 MG 24 hr capsule Take 1 capsule (75 mg total) by mouth daily.  30 capsule  11   No current facility-administered medications on file prior to visit.    Allergies  Allergen Reactions  . Penicillins   . Terbinafine Hcl     Family History  Problem Relation Age of Onset  . Cancer Neg Hx   . Heart disease Other     FH of CAD    BP 126/80  Pulse 78  Wt 223 lb (101.152 kg)  BMI 30.24 kg/m2  SpO2 98%   Review of Systems Denies LOC    Objective:   Physical Exam VITAL SIGNS:  See vs page GENERAL: no distress Pulses: dorsalis pedis intact bilat.   Feet: no deformity.  no ulcer on the feet.  feet are of normal color and temp.  no edema. There is bilateral onychomycosis.   Neuro: sensation is intact to touch on the feet, but severely decreased from normal     Assessment & Plan:  DM: he says today he is willing to take the more expensive lantus.

## 2012-06-18 NOTE — Patient Instructions (Addendum)
check your blood sugar twice a day.  vary the time of day when you check, between before the 3 meals, and at bedtime.  also check if you have symptoms of your blood sugar being too high or too low.  please keep a record of the readings and bring it to your next appointment here.  please call us sooner if your blood sugar goes below 70, or if you have a lot of readings over 200. Please come back for a follow-up appointment in 3 months.  Change your insulin to "lantus," 70 units each morning.

## 2012-08-01 ENCOUNTER — Ambulatory Visit (HOSPITAL_COMMUNITY)
Admission: RE | Admit: 2012-08-01 | Discharge: 2012-08-01 | Disposition: A | Discharge status: Home / Self Care | Payer: Self-pay | Source: Ambulatory Visit | Attending: Endocrinology | Admitting: Endocrinology

## 2012-08-01 ENCOUNTER — Other Ambulatory Visit (HOSPITAL_COMMUNITY): Payer: Self-pay | Admitting: *Deleted

## 2012-08-01 DIAGNOSIS — S3992XA Unspecified injury of lower back, initial encounter: Secondary | ICD-10-CM

## 2012-08-01 DIAGNOSIS — M25569 Pain in unspecified knee: Secondary | ICD-10-CM | POA: Insufficient documentation

## 2012-08-01 DIAGNOSIS — T07XXXA Unspecified multiple injuries, initial encounter: Secondary | ICD-10-CM | POA: Insufficient documentation

## 2012-08-01 DIAGNOSIS — M545 Low back pain, unspecified: Secondary | ICD-10-CM | POA: Insufficient documentation

## 2012-08-01 DIAGNOSIS — M25559 Pain in unspecified hip: Secondary | ICD-10-CM | POA: Insufficient documentation

## 2012-08-01 DIAGNOSIS — X58XXXA Exposure to other specified factors, initial encounter: Secondary | ICD-10-CM | POA: Insufficient documentation

## 2012-08-15 ENCOUNTER — Telehealth: Payer: Self-pay | Admitting: *Deleted

## 2012-08-15 NOTE — Telephone Encounter (Signed)
Pt called stating he needs an rx for fluconazole for a yeast infection. Please advise.

## 2012-08-15 NOTE — Telephone Encounter (Signed)
Please advise ov tomorrow, as i would need to see this.

## 2012-08-16 ENCOUNTER — Telehealth: Payer: Self-pay | Admitting: Endocrinology

## 2012-08-16 NOTE — Telephone Encounter (Signed)
We can bill him for his copay but this is not our normal practice.  But we will certainly do it.  Jan

## 2012-08-16 NOTE — Telephone Encounter (Signed)
I have schedule an appt for Tuesday Morning.

## 2012-08-16 NOTE — Telephone Encounter (Signed)
Jan, can you call pt and let him know that we can bill him his co-pay and schedule for him to come in to see Dr Everardo All?

## 2012-08-16 NOTE — Telephone Encounter (Signed)
Please advise pt i am happy to help you if you come in for appt.

## 2012-08-16 NOTE — Telephone Encounter (Signed)
Pt states he is out of work and cannot afford a co-pay to come in. Please advise.

## 2012-08-19 ENCOUNTER — Ambulatory Visit (INDEPENDENT_AMBULATORY_CARE_PROVIDER_SITE_OTHER): Payer: BC Managed Care – PPO | Admitting: Endocrinology

## 2012-08-19 VITALS — BP 130/80 | HR 76 | Wt 209.0 lb

## 2012-08-19 DIAGNOSIS — E119 Type 2 diabetes mellitus without complications: Secondary | ICD-10-CM

## 2012-08-19 LAB — HEMOGLOBIN A1C: Hgb A1c MFr Bld: 14.6 % — ABNORMAL HIGH (ref 4.6–6.5)

## 2012-08-19 MED ORDER — FLUCONAZOLE 150 MG PO TABS: 150.0000 mg | ORAL_TABLET | Freq: Every day | ORAL | Status: DC

## 2012-08-19 NOTE — Progress Notes (Signed)
Subjective:    Patient ID: Aaron Fox, male    DOB: Nov 15, 1955, 57 y.o.   MRN: [id number]  HPI The state of at least three ongoing medical problems is addressed today, with interval history of each noted here: Pt returns for f/u of insulin-requiring DM (dx'ed 2002; complicated by peripheral sensory neuropathy; therapy limited by pt's need for a simple, inexpensive regimen; he has never had severe hypoglycemia or DKA).  He takes lantus as rx'ed.  no cbg record, but states cbg's are well-controlled.  He seldom has hypoglycemia, and these episodes are mild.   Balanitis has recurred.   Dyslipidemia: he takes zocor as rx'ed.   Past Medical History  Diagnosis Date  . DIABETES MELLITUS, TYPE II 09/22/2006  . HYPOGONADISM, MALE 01/15/2007  . HYPERCHOLESTEROLEMIA 07/02/2008  . GOUT 09/22/2006  . HYPERTENSION 05/31/2009  . CHF 06/19/2008  . ALLERGIC RHINITIS 07/02/2008  . 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 Rheumatoid arthritis(714.0) 09/22/2006  . COUGH DUE TO ACE INHIBITORS 10/05/2008  . Glaucoma   . Atrial fibrillation   . ED (erectile dysfunction)   . PUD (peptic ulcer disease)     Past Surgical History  Procedure Laterality Date  . Spine surgery      Lumbar Spine Surgery  . Ankle fusion      left  . Lower arterial  02/22/2005  . Ct sinus ltd w/o cm  04/06/1997  . Electrocardiogram  09/29/2005    History   Social History  . Marital Status: Married    Spouse Name: N/A    Number of Children: N/A  . Years of Education: N/A   Occupational History  . 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 Smoking status: Former Smoker    Quit date: 02/28/2000  . Smokeless tobacco: Not on file  . Alcohol Use: No  . Drug Use: No  . Sexually Active: Not on file   Other Topics Concern  . Not on file   Social History Narrative   Regular exercise-yes    Current Outpatient Prescriptions on File Prior to Visit  Medication Sig Dispense Refill  .  allopurinol (ZYLOPRIM) 300 MG tablet Take 1 tablet (300 mg total) by mouth daily.  30 tablet  6  . aspirin 81 MG tablet Take 81 mg by mouth daily.        . clomiPHENE (CLOMID) 50 MG tablet 1/4 tab daily  10 tablet  11  . colchicine 0.6 MG tablet 1 pill every hour until gout is better.  Stop if diarrhea  10 tablet  2  . fluocinonide cream (LIDEX) 0.05 % Apply topically at bedtime.  60 g  3  . gabapentin (NEURONTIN) 300 MG capsule Take 1 capsule (300 mg total) by mouth 3 (three) times daily.  90 capsule  11  . glucose blood (ONE TOUCH ULTRA TEST) test strip 1 each by Other route 2 (two) times daily. And lancets 2/day 250.03  100 each  12  . insulin glargine (LANTUS) 100 UNIT/ML injection Inject 0.7 mLs (70 Units total) into the skin every morning.  30 mL  12  . Insulin Syringe-Needle U-100 (B-D INSULIN SYRINGE 2CC/27.5G) 27.5G X 5/8" 2 ML MISC Use as directed once daily       . simvastatin (ZOCOR) 80 MG tablet Take 1 tablet (80 mg total) by mouth at bedtime.  90 tablet  2  . tadalafil (CIALIS) 20 MG tablet Take 20 mg by mouth as needed.        Marland Kitchen  venlafaxine (EFFEXOR-XR) 75 MG 24 hr capsule Take 1 capsule (75 mg total) by mouth daily.  30 capsule  11   No current facility-administered medications on file prior to visit.    Allergies  Allergen Reactions  . Penicillins   . Terbinafine Hcl     Family History  Problem Relation Age of Onset  . Cancer Neg Hx   . Heart disease Other     FH of CAD    BP 130/80  Pulse 76  Wt 209 lb (94.802 kg)  BMI 28.34 kg/m2  SpO2 98%  Review of Systems Denies LOC and weight change.      Objective:   Physical Exam VITAL SIGNS:  See vs page GENERAL: no distress Penis: foreskin is red and swollen.   Lab Results  Component Value Date   HGBA1C 14.6* 08/19/2012      Assessment & Plan:  DM: therapy limited by wide divergence between reported cbg's and a1c.  i'll do the best i can. Balanitis, recurrent Dyslipidemia: zocor can interfere with  fluconazole.

## 2012-08-19 NOTE — Patient Instructions (Addendum)
check your blood sugar twice a day.  vary the time of day when you check, between before the 3 meals, and at bedtime.  also check if you have symptoms of your blood sugar being too high or too low.  please keep a record of the readings and bring it to your next appointment here.  please call us sooner if your blood sugar goes below 70, or if you have a lot of readings over 200. Please come back for a regular physical appointment in 3 months.  i have sent a prescription to your pharmacy, for the "fluconazole."  Don't take simvastatin on the same days as this.

## 2012-08-20 ENCOUNTER — Ambulatory Visit: Payer: BC Managed Care – PPO | Admitting: Endocrinology

## 2012-10-03 ENCOUNTER — Telehealth: Payer: Self-pay | Admitting: Endocrinology

## 2012-10-03 NOTE — Telephone Encounter (Signed)
Pt has numbness and tingling and wants to be referred to someone please call pt and let him know

## 2012-10-07 NOTE — Telephone Encounter (Signed)
Ov tomorrow, 1 PM 

## 2012-10-07 NOTE — Telephone Encounter (Signed)
What part of the body?

## 2012-10-07 NOTE — Telephone Encounter (Signed)
Pt states numbness is in both hands and arms.

## 2012-10-08 ENCOUNTER — Telehealth: Payer: Self-pay

## 2012-10-08 NOTE — Telephone Encounter (Signed)
Please advise ov 

## 2012-10-08 NOTE — Telephone Encounter (Signed)
Pt was given an appt on 8/18 at 9:45, pt was offered other appt times, but has restrictions as to when he could come in

## 2012-10-08 NOTE — Telephone Encounter (Signed)
Pt called stating he had numbness in his hands and arms and tingling,see 8/11 message,please advise [phone]

## 2012-10-14 ENCOUNTER — Ambulatory Visit (INDEPENDENT_AMBULATORY_CARE_PROVIDER_SITE_OTHER): Payer: BC Managed Care – PPO | Admitting: Endocrinology

## 2012-10-14 ENCOUNTER — Encounter: Payer: Self-pay | Admitting: Endocrinology

## 2012-10-14 ENCOUNTER — Telehealth: Payer: Self-pay

## 2012-10-14 VITALS — BP 122/76 | HR 90 | Ht 71.0 in | Wt 215.0 lb

## 2012-10-14 DIAGNOSIS — E119 Type 2 diabetes mellitus without complications: Secondary | ICD-10-CM

## 2012-10-14 DIAGNOSIS — E1059 Type 1 diabetes mellitus with other circulatory complications: Secondary | ICD-10-CM

## 2012-10-14 DIAGNOSIS — R209 Unspecified disturbances of skin sensation: Secondary | ICD-10-CM

## 2012-10-14 DIAGNOSIS — R200 Anesthesia of skin: Secondary | ICD-10-CM | POA: Insufficient documentation

## 2012-10-14 NOTE — Progress Notes (Signed)
Subjective:    Patient ID: Aaron Fox, male    DOB: [birth year], 56 y.o.   MRN: [id number]  HPI Pt returns for f/u of insulin-requiring DM (dx'ed 2002; complicated by peripheral sensory neuropathy; therapy limited by pt's need for a simple, inexpensive regimen; he has never had severe hypoglycemia or DKA).  He misses the insulin 1-2 times per month.  no cbg record, but states cbg's are sometmes low.  He says control is much better recently. Pt states 1 year of intermittent slight numbness of the hands and arms, but no assoc pain.  sxs are precip by use of upper extremities, or by posture.   Past Medical History  Diagnosis Date  . DIABETES MELLITUS, TYPE II 09/22/2006  . HYPOGONADISM, MALE 01/15/2007  . HYPERCHOLESTEROLEMIA 07/02/2008  . GOUT 09/22/2006  . HYPERTENSION 05/31/2009  . CHF 06/19/2008  . ALLERGIC RHINITIS 07/02/2008  . 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 Rheumatoid arthritis(714.0) 09/22/2006  . COUGH DUE TO ACE INHIBITORS 10/05/2008  . Glaucoma   . Atrial fibrillation   . ED (erectile dysfunction)   . PUD (peptic ulcer disease)     Past Surgical History  Procedure Laterality Date  . Spine surgery      Lumbar Spine Surgery  . Ankle fusion      left  . Lower arterial  02/22/2005  . Ct sinus ltd w/o cm  04/06/1997  . Electrocardiogram  09/29/2005    History   Social History  . Marital Status: Married    Spouse Name: N/A    Number of Children: N/A  . Years of Education: N/A   Occupational History  . 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 Smoking status: Former Smoker    Quit date: 02/28/2000  . Smokeless tobacco: Not on file  . Alcohol Use: No  . Drug Use: No  . Sexual Activity: Not on file   Other Topics Concern  . Not on file   Social History Narrative   Regular exercise-yes    Current Outpatient Prescriptions on File Prior to Visit  Medication Sig Dispense Refill  . allopurinol (ZYLOPRIM) 300 MG tablet Take 1  tablet (300 mg total) by mouth daily.  30 tablet  6  . aspirin 81 MG tablet Take 81 mg by mouth daily.        . clomiPHENE (CLOMID) 50 MG tablet 1/4 tab daily  10 tablet  11  . colchicine 0.6 MG tablet 1 pill every hour until gout is better.  Stop if diarrhea  10 tablet  2  . fluconazole (DIFLUCAN) 150 MG tablet Take 1 tablet (150 mg total) by mouth daily.  6 tablet  11  . fluocinonide cream (LIDEX) 0.05 % Apply topically at bedtime.  60 g  3  . gabapentin (NEURONTIN) 300 MG capsule Take 1 capsule (300 mg total) by mouth 3 (three) times daily.  90 capsule  11  . glucose blood (ONE TOUCH ULTRA TEST) test strip 1 each by Other route 2 (two) times daily. And lancets 2/day 250.03  100 each  12  . Insulin Syringe-Needle U-100 (B-D INSULIN SYRINGE 2CC/27.5G) 27.5G X 5/8" 2 ML MISC Use as directed once daily       . simvastatin (ZOCOR) 80 MG tablet Take 1 tablet (80 mg total) by mouth at bedtime.  90 tablet  2  . tadalafil (CIALIS) 20 MG tablet Take 20 mg by mouth as needed.        Marland Kitchen  venlafaxine (EFFEXOR-XR) 75 MG 24 hr capsule Take 1 capsule (75 mg total) by mouth daily.  30 capsule  11   No current facility-administered medications on file prior to visit.    Allergies  Allergen Reactions  . Penicillins   . Terbinafine Hcl    Family History  Problem Relation Age of Onset  . Cancer Neg Hx   . Heart disease Other     FH of CAD   BP 122/76  Pulse 90  Ht 5\' 11"  (1.803 m)  Wt 215 lb (97.523 kg)  BMI 30 kg/m2  SpO2 97%  Review of Systems Denies chest pain and sob.    Objective:   Physical Exam VITAL SIGNS:  See vs page GENERAL: no distress Pulses: radials are intact bilaterally.   UE's: normal color and temp.   Neuro: sensation is intact to touch on the UE's.       Assessment & Plan:  Numbness, new, uncertain etiology DM: control is apparently improved.

## 2012-10-14 NOTE — Telephone Encounter (Signed)
done

## 2012-10-14 NOTE — Patient Instructions (Addendum)
Let's check a nerve conduction test, and a treadmill test. Refer to a specialist.  you will receive a phone call, about days and times for appointments.

## 2012-10-14 NOTE — Telephone Encounter (Signed)
Aaron Fox at [facility] left voicemail, referral needs to be placed in referrals so that it will show up on your "orderable list for this sight, so that pt's test may be scheduled

## 2012-10-29 ENCOUNTER — Ambulatory Visit (INDEPENDENT_AMBULATORY_CARE_PROVIDER_SITE_OTHER): Payer: BC Managed Care – PPO | Admitting: Neurology

## 2012-10-29 ENCOUNTER — Encounter (INDEPENDENT_AMBULATORY_CARE_PROVIDER_SITE_OTHER): Payer: BC Managed Care – PPO | Admitting: Radiology

## 2012-10-29 DIAGNOSIS — E1142 Type 2 diabetes mellitus with diabetic polyneuropathy: Secondary | ICD-10-CM

## 2012-10-29 DIAGNOSIS — R209 Unspecified disturbances of skin sensation: Secondary | ICD-10-CM

## 2012-10-29 NOTE — Procedures (Signed)
HISTORY:  Aaron Fox is a 57 year old gentleman with a history of diabetes and a peripheral neuropathy associated with this. The patient has noted numbness in both hands and forearms over the last 2 years, and the symptoms are worsening. The patient does report some cervical spine discomfort. The patient is being evaluated for a neuropathy or a possible cervical radiculopathy.  NERVE CONDUCTION STUDIES:  Nerve conduction studies were performed on both upper extremities. The distal motor latencies for the median nerves were prolonged bilaterally, with normal motor amplitudes for these nerves. The distal motor latencies for the ulnar nerves were normal bilaterally, with low motor amplitudes for these nerves bilaterally. The F wave latencies for the median and ulnar nerves were prolonged bilaterally, with conduction slowing seen for these nerves bilaterally as well. The sensory latencies for the median and radial nerves were prolonged bilaterally, with absent ulnar sensory latencies bilaterally.  EMG STUDIES:  EMG study was performed on the left upper extremity:  The first dorsal interosseous muscle reveals 2 to 5 K units with decreased recruitment. 2+ fibrillations and positive waves were noted. The abductor pollicis brevis muscle reveals 2 to 4 K units with full recruitment. One plus fibrillations and positive waves were noted. The extensor indicis proprius muscle reveals 2 to 5 K units with decreased recruitment. No fibrillations or positive waves were noted. The pronator teres muscle reveals 2 to 3 K units with full recruitment. No fibrillations or positive waves were noted. The biceps muscle reveals 1 to 2 K units with full recruitment. No fibrillations or positive waves were noted. The triceps muscle reveals 2 to 4 K units with full recruitment. No fibrillations or positive waves were noted. The anterior deltoid muscle reveals 2 to 3 K units with full recruitment. No fibrillations or  positive waves were noted. The cervical paraspinal muscles were tested at 2 levels. No abnormalities of insertional activity were seen at either level tested. There was good relaxation.  EMG study was performed on the right upper extremity:  The first dorsal interosseous muscle reveals up to 10 K units with moderately decreased recruitment. 2+ fibrillations and positive waves were noted. The abductor pollicis brevis muscle reveals 2 to 4 K units with decreased recruitment. One plus fibrillations and positive waves were noted. The extensor indicis proprius muscle reveals 2 to 4 K units with full recruitment. One plus fibrillations and positive waves were noted. The pronator teres muscle reveals 2 to 3 K units with full recruitment. No fibrillations or positive waves were noted. The biceps muscle reveals 1 to 2 K units with full recruitment. No fibrillations or positive waves were noted. The triceps muscle reveals 2 to 4 K units with full recruitment. No fibrillations or positive waves were noted. The anterior deltoid muscle reveals 2 to 3 K units with full recruitment. No fibrillations or positive waves were noted. The cervical paraspinal muscles were tested at 2 levels. No abnormalities of insertional activity were seen at either level tested. There was good relaxation.   IMPRESSION:  Nerve conduction studies on both upper extremities shows evidence of a peripheral neuropathy with bilateral ulnar and median nerve dysfunction. EMG evaluations of both upper extremities shows evidence of distal acute and chronic denervation consistent with the diagnosis of a peripheral neuropathy. The right arm is more severely impaired than the left, and ulnar nerves appear to be more significantly impaired than the median nerves. There is no evidence of a cervical radiculopathy on either side.  Aaron Palau MD 10/29/2012 1:53  PM  [facility] [address] [place], [place]  [address]  Phone [phone] Fax [phone]

## 2012-11-13 ENCOUNTER — Encounter: Payer: BC Managed Care – PPO | Admitting: Physician Assistant

## 2012-11-19 ENCOUNTER — Ambulatory Visit: Payer: BC Managed Care – PPO | Admitting: Endocrinology

## 2012-11-20 ENCOUNTER — Ambulatory Visit: Payer: BC Managed Care – PPO | Admitting: Endocrinology

## 2013-04-01 ENCOUNTER — Ambulatory Visit: Payer: BC Managed Care – PPO | Admitting: Endocrinology

## 2013-04-04 ENCOUNTER — Encounter: Payer: Self-pay | Admitting: Endocrinology

## 2013-04-04 ENCOUNTER — Ambulatory Visit (INDEPENDENT_AMBULATORY_CARE_PROVIDER_SITE_OTHER): Payer: BC Managed Care – PPO | Admitting: Endocrinology

## 2013-04-04 ENCOUNTER — Ambulatory Visit: Payer: BC Managed Care – PPO | Admitting: Endocrinology

## 2013-04-04 VITALS — BP 126/76 | HR 88 | Temp 98.4°F | Ht 71.0 in | Wt 221.0 lb

## 2013-04-04 DIAGNOSIS — M25552 Pain in left hip: Secondary | ICD-10-CM

## 2013-04-04 DIAGNOSIS — R3129 Other microscopic hematuria: Secondary | ICD-10-CM

## 2013-04-04 DIAGNOSIS — Z79899 Other long term (current) drug therapy: Secondary | ICD-10-CM

## 2013-04-04 DIAGNOSIS — E7800 Pure hypercholesterolemia, unspecified: Secondary | ICD-10-CM

## 2013-04-04 DIAGNOSIS — G619 Inflammatory polyneuropathy, unspecified: Secondary | ICD-10-CM

## 2013-04-04 DIAGNOSIS — M25559 Pain in unspecified hip: Secondary | ICD-10-CM

## 2013-04-04 DIAGNOSIS — M109 Gout, unspecified: Secondary | ICD-10-CM

## 2013-04-04 DIAGNOSIS — I10 Essential (primary) hypertension: Secondary | ICD-10-CM

## 2013-04-04 DIAGNOSIS — Z125 Encounter for screening for malignant neoplasm of prostate: Secondary | ICD-10-CM

## 2013-04-04 DIAGNOSIS — E119 Type 2 diabetes mellitus without complications: Secondary | ICD-10-CM

## 2013-04-04 DIAGNOSIS — G622 Polyneuropathy due to other toxic agents: Secondary | ICD-10-CM

## 2013-04-04 DIAGNOSIS — E291 Testicular hypofunction: Secondary | ICD-10-CM

## 2013-04-04 LAB — CBC WITH DIFFERENTIAL/PLATELET
BASOS ABS: 0 10*3/uL (ref 0.0–0.1)
Basophils Relative: 0.6 % (ref 0.0–3.0)
EOS ABS: 0.3 10*3/uL (ref 0.0–0.7)
EOS PCT: 5.5 % — AB (ref 0.0–5.0)
HEMATOCRIT: 38.3 % — AB (ref 39.0–52.0)
Hemoglobin: 13 g/dL (ref 13.0–17.0)
LYMPHS ABS: 1.8 10*3/uL (ref 0.7–4.0)
Lymphocytes Relative: 37.6 % (ref 12.0–46.0)
MCHC: 33.8 g/dL (ref 30.0–36.0)
MCV: 83.4 fl (ref 78.0–100.0)
MONO ABS: 0.7 10*3/uL (ref 0.1–1.0)
Monocytes Relative: 15.2 % — ABNORMAL HIGH (ref 3.0–12.0)
NEUTROS PCT: 41.1 % — AB (ref 43.0–77.0)
Neutro Abs: 2 10*3/uL (ref 1.4–7.7)
PLATELETS: 201 10*3/uL (ref 150.0–400.0)
RBC: 4.6 Mil/uL (ref 4.22–5.81)
RDW: 13.2 % (ref 11.5–14.6)
WBC: 4.8 10*3/uL (ref 4.5–10.5)

## 2013-04-04 LAB — LIPID PANEL
CHOLESTEROL: 212 mg/dL — AB (ref 0–200)
HDL: 39.1 mg/dL (ref >39.00)
Total CHOL/HDL Ratio: 5
Triglycerides: 297 mg/dL — ABNORMAL HIGH (ref 0.0–149.0)
VLDL: 59.4 mg/dL — AB (ref 0.0–40.0)

## 2013-04-04 LAB — URINALYSIS, ROUTINE W REFLEX MICROSCOPIC
BILIRUBIN URINE: NEGATIVE
Ketones, ur: NEGATIVE
Leukocytes, UA: NEGATIVE
NITRITE: NEGATIVE
Specific Gravity, Urine: 1.01 (ref 1.000–1.030)
TOTAL PROTEIN, URINE-UPE24: 30 — AB
UROBILINOGEN UA: 0.2 (ref 0.0–1.0)
Urine Glucose: >=1000 — AB
pH: 6.5 (ref 5.0–8.0)

## 2013-04-04 LAB — HEPATIC FUNCTION PANEL
ALT: 12 U/L (ref 0–53)
AST: 15 U/L (ref 0–37)
Albumin: 3.7 g/dL (ref 3.5–5.2)
Alkaline Phosphatase: 121 U/L — ABNORMAL HIGH (ref 39–117)
BILIRUBIN DIRECT: 0 mg/dL (ref 0.0–0.3)
BILIRUBIN TOTAL: 0.8 mg/dL (ref 0.3–1.2)
TOTAL PROTEIN: 7.9 g/dL (ref 6.0–8.3)

## 2013-04-04 LAB — TSH: TSH: 1.43 u[IU]/mL (ref 0.35–5.50)

## 2013-04-04 LAB — BASIC METABOLIC PANEL
BUN: 11 mg/dL (ref 6–23)
CALCIUM: 9.3 mg/dL (ref 8.4–10.5)
CHLORIDE: 101 meq/L (ref 96–112)
CO2: 24 meq/L (ref 19–32)
CREATININE: 0.8 mg/dL (ref 0.4–1.5)
GFR: 122.82 mL/min (ref >60.00)
Glucose, Bld: 366 mg/dL — ABNORMAL HIGH (ref 70–99)
Potassium: 4.4 mEq/L (ref 3.5–5.1)
SODIUM: 133 meq/L — AB (ref 135–145)

## 2013-04-04 LAB — HEMOGLOBIN A1C: HEMOGLOBIN A1C: 11.7 % — AB (ref 4.6–6.5)

## 2013-04-04 LAB — PSA: PSA: 2.62 ng/mL (ref 0.10–4.00)

## 2013-04-04 LAB — TESTOSTERONE: Testosterone: 247.22 ng/dL — ABNORMAL LOW (ref 350.00–890.00)

## 2013-04-04 LAB — VITAMIN B12: VITAMIN B 12: 313 pg/mL (ref 211–911)

## 2013-04-04 LAB — URIC ACID: URIC ACID, SERUM: 6.3 mg/dL (ref 4.0–7.8)

## 2013-04-04 NOTE — Patient Instructions (Addendum)
check your blood sugar twice a day.  vary the time of day when you check, between before the 3 meals, and at bedtime.  also check if you have symptoms of your blood sugar being too high or too low.  please keep a record of the readings and bring it to your next appointment here.  please call us sooner if your blood sugar goes below 70, or if you have a lot of readings over 200. Please come back soon for a regular physical appointment.  blood tests are being requested for you today.  We'll contact you with results.   Refer to a specialist for you hip pain.  you will receive a phone call, about a day and time for an appointment.

## 2013-04-04 NOTE — Progress Notes (Signed)
Subjective:    Patient ID: Aaron Fox, male    DOB: 03-25-1955, 58 y.o.   MRN: [id number]  HPI Pt returns for f/u of insulin-requiring DM (dx'ed 2423; complicated by peripheral sensory neuropathy;  he has never had severe hypoglycemia or DKA; he says he can only afford human insulin; he needs a simple qd insulin regimen).  He says he never misses the insulin.  no cbg record, but states cbg's vary from 58 (before lunch) to 200's.   Past Medical History  Diagnosis Date  . DIABETES MELLITUS, TYPE II 09/22/2006  . HYPOGONADISM, MALE 01/15/2007  . HYPERCHOLESTEROLEMIA 07/02/2008  . GOUT 09/22/2006  . HYPERTENSION 05/31/2009  . CHF 06/19/2008  . ALLERGIC RHINITIS 07/02/2008  . 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 Rheumatoid arthritis(714.0) 09/22/2006  . COUGH DUE TO ACE INHIBITORS 10/05/2008  . Glaucoma   . Atrial fibrillation   . ED (erectile dysfunction)   . PUD (peptic ulcer disease)     Past Surgical History  Procedure Laterality Date  . Spine surgery      Lumbar Spine Surgery  . Ankle fusion      left  . Lower arterial  02/22/2005  . Ct sinus ltd w/o cm  04/06/1997  . Electrocardiogram  09/29/2005    History   Social History  . Marital Status: Married    Spouse Name: N/A    Number of Children: N/A  . Years of Education: N/A   Occupational History  . 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 Smoking status: Former Smoker    Quit date: 02/28/2000  . Smokeless tobacco: Not on file  . Alcohol Use: No  . Drug Use: No  . Sexual Activity: Not on file   Other Topics Concern  . Not on file   Social History Narrative   Regular exercise-yes    Current Outpatient Prescriptions on File Prior to Visit  Medication Sig Dispense Refill  . allopurinol (ZYLOPRIM) 300 MG tablet Take 1 tablet (300 mg total) by mouth daily.  30 tablet  6  . aspirin 81 MG tablet Take 81 mg by mouth daily.        . clomiPHENE (CLOMID) 50 MG tablet 1/4 tab daily   10 tablet  11  . colchicine 0.6 MG tablet 1 pill every hour until gout is better.  Stop if diarrhea  10 tablet  2  . fluconazole (DIFLUCAN) 150 MG tablet Take 1 tablet (150 mg total) by mouth daily.  6 tablet  11  . gabapentin (NEURONTIN) 300 MG capsule Take 1 capsule (300 mg total) by mouth 3 (three) times daily.  90 capsule  11  . glucose blood (ONE TOUCH ULTRA TEST) test strip 1 each by Other route 2 (two) times daily. And lancets 2/day 250.03  100 each  12  . Insulin Syringe-Needle U-100 (B-D INSULIN SYRINGE 2CC/27.5G) 27.5G X 5/8" 2 ML MISC Use as directed once daily       . simvastatin (ZOCOR) 80 MG tablet Take 1 tablet (80 mg total) by mouth at bedtime.  90 tablet  2  . tadalafil (CIALIS) 20 MG tablet Take 20 mg by mouth as needed.        . venlafaxine (EFFEXOR-XR) 75 MG 24 hr capsule Take 1 capsule (75 mg total) by mouth daily.  30 capsule  11   No current facility-administered medications on file prior to visit.    Allergies  Allergen Reactions  .  Penicillins   . Terbinafine Hcl     Family History  Problem Relation Age of Onset  . Cancer Neg Hx   . Heart disease Other     FH of CAD    BP 126/76  Pulse 88  Temp(Src) 98.4 F (36.9 C) (Oral)  Ht 5\' 11"  (1.803 m)  Wt 221 lb (100.245 kg)  BMI 30.84 kg/m2  SpO2 96%  Review of Systems Denies LOC and weight change.  He has left hip pain.      Objective:   Physical Exam VITAL SIGNS:  See vs page GENERAL: no distress  Lab Results  Component Value Date   HGBA1C 14.6* 08/19/2012      Assessment & Plan:  Hip pain, new, uncertain etiology DM: uncertain control Noncompliance with cbg recording: this limits the rx of DM.

## 2013-04-07 LAB — MICROALBUMIN / CREATININE URINE RATIO
CREATININE, U: 30.1 mg/dL
MICROALB/CREAT RATIO: 107.9 mg/g — AB (ref 0.0–30.0)
Microalb, Ur: 32.5 mg/dL — ABNORMAL HIGH (ref 0.0–1.9)

## 2013-04-07 LAB — LDL CHOLESTEROL, DIRECT: Direct LDL: 120.2 mg/dL

## 2013-04-09 ENCOUNTER — Ambulatory Visit: Payer: BC Managed Care – PPO | Admitting: Family Medicine

## 2013-04-14 ENCOUNTER — Ambulatory Visit: Payer: BC Managed Care – PPO | Admitting: Family Medicine

## 2013-07-24 ENCOUNTER — Encounter: Payer: Self-pay | Admitting: Endocrinology

## 2013-07-24 ENCOUNTER — Ambulatory Visit (INDEPENDENT_AMBULATORY_CARE_PROVIDER_SITE_OTHER): Payer: BC Managed Care – PPO | Admitting: Endocrinology

## 2013-07-24 ENCOUNTER — Ambulatory Visit
Admission: RE | Admit: 2013-07-24 | Discharge: 2013-07-24 | Disposition: A | Discharge status: Home / Self Care | Payer: BC Managed Care – PPO | Source: Ambulatory Visit | Attending: Endocrinology | Admitting: Endocrinology

## 2013-07-24 VITALS — BP 122/72 | HR 85 | Temp 98.6°F | Ht 71.0 in | Wt 220.0 lb

## 2013-07-24 DIAGNOSIS — Z0000 Encounter for general adult medical examination without abnormal findings: Secondary | ICD-10-CM

## 2013-07-24 DIAGNOSIS — M2550 Pain in unspecified joint: Secondary | ICD-10-CM

## 2013-07-24 DIAGNOSIS — R0600 Dyspnea, unspecified: Secondary | ICD-10-CM | POA: Insufficient documentation

## 2013-07-24 DIAGNOSIS — R0609 Other forms of dyspnea: Secondary | ICD-10-CM

## 2013-07-24 DIAGNOSIS — R0989 Other specified symptoms and signs involving the circulatory and respiratory systems: Secondary | ICD-10-CM

## 2013-07-24 DIAGNOSIS — E119 Type 2 diabetes mellitus without complications: Secondary | ICD-10-CM

## 2013-07-24 DIAGNOSIS — M25569 Pain in unspecified knee: Secondary | ICD-10-CM | POA: Insufficient documentation

## 2013-07-24 LAB — BRAIN NATRIURETIC PEPTIDE: PRO B NATRI PEPTIDE: 30 pg/mL (ref 0.0–100.0)

## 2013-07-24 LAB — HEMOGLOBIN A1C: HEMOGLOBIN A1C: 9.5 % — AB (ref 4.6–6.5)

## 2013-07-24 LAB — SEDIMENTATION RATE: Sed Rate: 52 mm/hr — ABNORMAL HIGH (ref 0–22)

## 2013-07-24 LAB — RHEUMATOID FACTOR: Rhuematoid fact SerPl-aCnc: <10 IU/mL (ref <=14)

## 2013-07-24 NOTE — Patient Instructions (Addendum)
blood tests and x-rays are being requested for you today.  We'll contact you with results. please consider these measures for your health:  minimize alcohol.  do not use tobacco products.  have a colonoscopy at least every 10 years from age 58.   keep firearms safely stored.  always use seat belts.  have working smoke alarms in your home.  see an eye doctor and dentist regularly.  never drive under the influence of alcohol or drugs (including prescription drugs).   Please come back for a follow-up appointment in 3 months.

## 2013-07-24 NOTE — Progress Notes (Signed)
Subjective:    Patient ID: Aaron Fox, male    DOB: 04-09-1955, 58 y.o.   MRN: [id number]  HPI Pt is here for regular wellness examination, and is feeling pretty well in general, and says chronic med probs are stable, except as noted below Past Medical History  Diagnosis Date  . DIABETES MELLITUS, TYPE II 09/22/2006  . HYPOGONADISM, MALE 01/15/2007  . HYPERCHOLESTEROLEMIA 07/02/2008  . GOUT 09/22/2006  . HYPERTENSION 05/31/2009  . CHF 06/19/2008  . ALLERGIC RHINITIS 07/02/2008  . 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 Rheumatoid arthritis(714.0) 09/22/2006  . COUGH DUE TO ACE INHIBITORS 10/05/2008  . Glaucoma   . Atrial fibrillation   . ED (erectile dysfunction)   . PUD (peptic ulcer disease)     Past Surgical History  Procedure Laterality Date  . Spine surgery      Lumbar Spine Surgery  . Ankle fusion      left  . Lower arterial  02/22/2005  . Ct sinus ltd w/o cm  04/06/1997  . Electrocardiogram  09/29/2005    History   Social History  . Marital Status: Married    Spouse Name: N/A    Number of Children: N/A  . Years of Education: N/A   Occupational History  . 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 Smoking status: Former Smoker    Quit date: 02/28/2000  . Smokeless tobacco: Not on file  . Alcohol Use: No  . Drug Use: No  . Sexual Activity: Not on file   Other Topics Concern  . Not on file   Social History Narrative   Regular exercise-yes    Current Outpatient Prescriptions on File Prior to Visit  Medication Sig Dispense Refill  . allopurinol (ZYLOPRIM) 300 MG tablet Take 1 tablet (300 mg total) by mouth daily.  30 tablet  6  . aspirin 81 MG tablet Take 81 mg by mouth daily.        . clomiPHENE (CLOMID) 50 MG tablet 1/4 tab daily  10 tablet  11  . colchicine 0.6 MG tablet 1 pill every hour until gout is better.  Stop if diarrhea  10 tablet  2  . gabapentin (NEURONTIN) 300 MG capsule Take 1 capsule (300 mg total) by  mouth 3 (three) times daily.  90 capsule  11  . glucose blood (ONE TOUCH ULTRA TEST) test strip 1 each by Other route 2 (two) times daily. And lancets 2/day 250.03  100 each  12  . insulin NPH Human (HUMULIN N,NOVOLIN N) 100 UNIT/ML injection Inject 65 Units into the skin every morning.       . Insulin Syringe-Needle U-100 (B-D INSULIN SYRINGE 2CC/27.5G) 27.5G X 5/8" 2 ML MISC Use as directed once daily       . simvastatin (ZOCOR) 80 MG tablet Take 1 tablet (80 mg total) by mouth at bedtime.  90 tablet  2  . tadalafil (CIALIS) 20 MG tablet Take 20 mg by mouth as needed.        . venlafaxine (EFFEXOR-XR) 75 MG 24 hr capsule Take 1 capsule (75 mg total) by mouth daily.  30 capsule  11   No current facility-administered medications on file prior to visit.    Allergies  Allergen Reactions  . Penicillins   . Terbinafine Hcl   . Zestril [Lisinopril] Cough    Family History  Problem Relation Age of Onset  . Cancer Neg Hx   . Heart  disease Other     FH of CAD    BP 122/72  Pulse 85  Temp(Src) 98.6 F (37 C) (Oral)  Ht 5' 11"  (1.803 m)  Wt 220 lb (99.791 kg)  BMI 30.70 kg/m2  SpO2 97%     Review of Systems  Constitutional: Negative for fever and unexpected weight change.  HENT: Negative for hearing loss.   Eyes: Negative for visual disturbance.  Respiratory: Negative for shortness of breath.   Cardiovascular: Negative for chest pain.  Gastrointestinal: Negative for blood in stool.  Endocrine: Negative for cold intolerance.  Genitourinary: Negative for hematuria and difficulty urinating.  Musculoskeletal: Positive for back pain.  Skin: Negative for rash.  Allergic/Immunologic: Negative for environmental allergies.  Neurological: Negative for syncope.  Hematological: Does not bruise/bleed easily.  Psychiatric/Behavioral: Negative for dysphoric mood.       Objective:   Physical Exam VS: see vs page GEN: no distress HEAD: head: no deformity eyes: no periorbital  swelling, no proptosis external nose and ears are normal mouth: no lesion seen NECK: supple, thyroid is not enlarged CHEST WALL: no deformity LUNGS: clear to auscultation BREASTS:  No gynecomastia CV: reg rate and rhythm, no murmur ABD: abdomen is soft, nontender.  no hepatosplenomegaly.  not distended.  no hernia RECTAL: normal external and internal exam.  heme neg. PROSTATE:  Normal size.  No nodule MUSCULOSKELETAL: muscle bulk and strength are grossly normal.  no obvious joint swelling.  gait is normal and steady PULSES:  no carotid bruit NEURO:  cn 2-12 grossly intact.   readily moves all 4's.  SKIN:  Normal texture and temperature.  No rash or suspicious lesion is visible.   NODES:  None palpable at the neck PSYCH: alert, well-oriented.  Does not appear anxious nor depressed.      Assessment & Plan:  Wellness visit today, with problems stable, except as noted. Patient Instructions  blood tests and x-rays are being requested for you today.  We'll contact you with results. please consider these measures for your health:  minimize alcohol.  do not use tobacco products.  have a colonoscopy at least every 10 years from age 54.   keep firearms safely stored.  always use seat belts.  have working smoke alarms in your home.  see an eye doctor and dentist regularly.  never drive under the influence of alcohol or drugs (including prescription drugs).   Please come back for a follow-up appointment in 3 months.       SEPARATE EVALUATION FOLLOWS--EACH PROBLEM HERE IS NEW, NOT RESPONDING TO TREATMENT, OR POSES SIGNIFICANT RISK TO THE PATIENT'S HEALTH: HISTORY OF THE PRESENT ILLNESS: Pt returns for f/u of insulin-requiring DM (dx'ed 3335; complicated by peripheral sensory neuropathy, he has associated retinopathy and nephropathy; he has never had severe hypoglycemia or DKA; he says he can only afford human insulin; he needs a simple qd insulin regimen).  He says he never misses the insulin.   no cbg record, but states cbg's are "good."  Pt says he seldom misses the insulin.  PAST MEDICAL HISTORY reviewed and up to date today REVIEW OF SYSTEMS: He denies hypoglycemia.  He has low-back pain and doe.  PHYSICAL EXAMINATION: VITAL SIGNS:  See vs page GENERAL: no distress Pulses: dorsalis pedis intact bilat.  Feet: no deformity. normal color and temp. Trace bilat leg edema. There is bilateral onychomycosis.  Skin: no ulcer on the feet.  Neuro: sensation is intact to touch on the feet, but decreased from normal.  LAB/XRAY  RESULTS: Lab Results  Component Value Date   HGBA1C 9.5* 07/24/2013  ESR=50 IMPRESSION: DM: moderate exacerbation Noncompliance with cbg recording: I'll work around this as best I can, by titrating insulin to a1c Arthralgias: persistent PLAN:  Increase insulin to 65 units qam Ref rheumatol

## 2013-07-25 LAB — ANA: Anti Nuclear Antibody(ANA): POSITIVE — AB

## 2013-07-25 LAB — ANTI-NUCLEAR AB-TITER (ANA TITER): ANA TITER 1: NEGATIVE (ref <1:40)

## 2013-08-25 ENCOUNTER — Ambulatory Visit (INDEPENDENT_AMBULATORY_CARE_PROVIDER_SITE_OTHER): Payer: BC Managed Care – PPO | Admitting: Family Medicine

## 2013-08-25 ENCOUNTER — Encounter: Payer: Self-pay | Admitting: Internal Medicine

## 2013-08-25 ENCOUNTER — Encounter: Payer: Self-pay | Admitting: Family Medicine

## 2013-08-25 ENCOUNTER — Telehealth: Payer: Self-pay | Admitting: Endocrinology

## 2013-08-25 ENCOUNTER — Other Ambulatory Visit: Payer: Self-pay

## 2013-08-25 VITALS — BP 138/84 | HR 75 | Ht 71.5 in | Wt 220.0 lb

## 2013-08-25 DIAGNOSIS — G5702 Lesion of sciatic nerve, left lower limb: Secondary | ICD-10-CM | POA: Insufficient documentation

## 2013-08-25 DIAGNOSIS — G5700 Lesion of sciatic nerve, unspecified lower limb: Secondary | ICD-10-CM

## 2013-08-25 MED ORDER — TADALAFIL 20 MG PO TABS: 20.0000 mg | ORAL_TABLET | ORAL | Status: DC | PRN

## 2013-08-25 MED ORDER — MELOXICAM 15 MG PO TABS: 15.0000 mg | ORAL_TABLET | Freq: Every day | ORAL | Status: DC

## 2013-08-25 MED ORDER — CLOMIPHENE CITRATE 50 MG PO TABS: ORAL_TABLET | ORAL | Status: DC

## 2013-08-25 NOTE — Progress Notes (Signed)
  [facility] [address] [place], [place] [address] Phone: [phone] Subjective:    I'm seeing this patient by the request  of:  Renato Shin, MD   CC: Left hip pain  [id number] Aaron Fox is a 58 y.o. male coming in with complaint of left hip pain. Patient does have a past medical history significant for gout and possible rheumatoid arthritis. Patient is not taking medications for either these. Patient has been referred recently to rheumatology but has not seen a [name]. Patient has had this chronic left hip pain for quite some time. Patient states it seems to be radiating more towards is back now. Patient states most of it pain seems to be more on the posterior aspect of the hip. Patient states he can go into the buttocks region. Patient denies any groin pain that is associated with a. Patient states it does not stop him from any activity but after sitting a long amount of time he has significant amount tightness. Patient also states sometimes the pain can be on the lateral aspect of the hip which makes it difficult to sleep. Denies any radiation down the leg any numbness or weakness. Patient states that he moves certain ways he can have some pain. Describes it as a dull aching sensation and a 7/10 in severity.    patient has had x-rays of the hip previously that I did review today. Patient's hip x-rays one year ago show moderate osteoarthritic changes.  Past medical history, social, surgical and family history all reviewed in electronic medical record.   Review of Systems: No headache, visual changes, nausea, vomiting, diarrhea, constipation, dizziness, abdominal pain, skin rash, fevers, chills, night sweats, weight loss, swollen lymph nodes, body aches, joint swelling, muscle aches, chest pain, shortness of breath, mood changes.   Objective Blood pressure 138/84, pulse 75, height 5' 11.5" (1.816 m), weight 220 lb (99.791 kg), SpO2 98.00%.    General: No apparent distress alert and oriented x3 mood and affect normal, dressed appropriately.  HEENT: Pupils equal, extraocular movements intact  Respiratory: Patient's speak in full sentences and does not appear short of breath  Cardiovascular: No lower extremity edema, non tender, no erythema  Skin: Warm dry intact with no signs of infection or rash on extremities or on axial skeleton.  Abdomen: Soft nontender  Neuro: Cranial nerves II through XII are intact, neurovascularly intact in all extremities with 2+ DTRs and 2+ pulses.  Lymph: No lymphadenopathy of posterior or anterior cervical chain or axillae bilaterally.  Gait normal with good balance and coordination.  MSK:  Non tender with full range of motion and good stability and symmetric strength and tone of shoulders, elbows, wrist,  knee and ankles bilaterally.  Hip: Left ROM IR: 25 Deg, ER: 35 Deg, Flexion: 100 Deg, Extension: 80 Deg, Abduction: 35 Deg, Adduction: 35 Deg Strength IR: 4/5, ER: 5/5, Flexion: 4/5, Extension: 5/5, Abduction: 3/5, Adduction: 5/5 Pelvic alignment unremarkable to inspection and palpation. Standing hip rotation and gait without trendelenburg sign / unsteadiness. Greater trochanter without tenderness to palpation.  tenderness over piriformis  Positive Faber No SI joint tenderness and normal minimal SI movement.   Impression and Recommendations:     This case required medical decision making of moderate complexity.

## 2013-08-25 NOTE — Telephone Encounter (Signed)
Patient would like his testosterone medication called in  Patient did not have the medication name   Thank You :)

## 2013-08-25 NOTE — Telephone Encounter (Signed)
Rx sent to pharmacy   

## 2013-08-25 NOTE — Telephone Encounter (Signed)
Rx sent to pharmacy. Pt advised.

## 2013-08-25 NOTE — Telephone Encounter (Signed)
Error

## 2013-08-25 NOTE — Assessment & Plan Note (Signed)
Patient physical exam today is more specific for piriformis syndrome then true osteoarthritic changes of the hip. Patient does have known osteoarthritic changes of the hip secondary to treat his x-rays. Patient also past medical history significant for rheumatoid arthritis. Patient has had a positive ANA rheumatoid factor was negative with a low titer. Patient also does have a history of gout and has not taken any medications that could also be contributing to his pain. At this point the we will start directed home physical therapy to the piriformis. We discussed manual massage that could be helpful as well as an icing regimen. Patient will try anti-inflammatories as well. Patient will try these interventions and come back again in 3 weeks. Continuing to have pain I would consider restarting his gout medications and we'll consider doing an ultrasound for further evaluation.

## 2013-08-25 NOTE — Telephone Encounter (Signed)
Pt is not wanting cialis  He wants the med for testosterone but he doesn't the name of it maybe Zylopril? He isn't positive.

## 2013-08-25 NOTE — Telephone Encounter (Signed)
Pt is requesting refill on Cialis. Med is listed under historical med. Ok to refill? Thanks!

## 2013-08-25 NOTE — Patient Instructions (Addendum)
Good to meet you Tennis ball in back left pocket at all times.  Ice 20 minutes 2 times a day Meloxicam daily for 10 days then as needed Vitamin D 2000 IU dialy Turmeric 500mg  twice daily.  Consider starting the allopurinol. If you want you can call me and I will send it in. Exercises 3 times a week.  Come see me again in 3 weeks.

## 2013-08-25 NOTE — Telephone Encounter (Signed)
ok 

## 2013-09-15 ENCOUNTER — Telehealth: Payer: Self-pay

## 2013-09-15 NOTE — Telephone Encounter (Signed)
No, thank you

## 2013-09-16 ENCOUNTER — Ambulatory Visit: Payer: BC Managed Care – PPO | Admitting: Family Medicine

## 2013-10-22 ENCOUNTER — Encounter: Payer: Self-pay | Admitting: Endocrinology

## 2013-10-22 ENCOUNTER — Ambulatory Visit (INDEPENDENT_AMBULATORY_CARE_PROVIDER_SITE_OTHER): Payer: BC Managed Care – PPO | Admitting: Endocrinology

## 2013-10-22 VITALS — BP 124/64 | HR 85 | Temp 98.8°F | Ht 71.0 in | Wt 224.0 lb

## 2013-10-22 DIAGNOSIS — E1065 Type 1 diabetes mellitus with hyperglycemia: Principal | ICD-10-CM

## 2013-10-22 DIAGNOSIS — E1049 Type 1 diabetes mellitus with other diabetic neurological complication: Secondary | ICD-10-CM

## 2013-10-22 LAB — HEMOGLOBIN A1C: Hgb A1c MFr Bld: 8.9 % — ABNORMAL HIGH (ref 4.6–6.5)

## 2013-10-22 MED ORDER — GLUCOSE BLOOD VI STRP: 1.0000 | ORAL_STRIP | Freq: Two times a day (BID) | Status: DC

## 2013-10-22 NOTE — Patient Instructions (Addendum)
blood tests are being requested for you today.  We'll contact you with results. check your blood sugar twice a day.  vary the time of day when you check, between before the 3 meals, and at bedtime.  also check if you have symptoms of your blood sugar being too high or too low.  please keep a record of the readings and bring it to your next appointment here.  You can write it on any piece of paper.  please call us sooner if your blood sugar goes below 70, or if you have a lot of readings over 200. Here is a new meter.  i have sent a prescription to your pharmacy, for strips.  On this type of insulin schedule, you should eat meals (especially breakfast) on a regular schedule.  If a meal is missed or significantly delayed, your blood sugar could go low.

## 2013-10-22 NOTE — Progress Notes (Signed)
Subjective:    Patient ID: Aaron Fox, male    DOB: December 10, 1955, 58 y.o.   MRN: [id number]  HPI Pt returns for f/u of insulin-requiring DM (dx'ed 1062; complicated by peripheral sensory neuropathy, retinopathy and nephropathy; he has never had pancreatitis, severe hypoglycemia, or DKA; he says he can only afford human insulin; he has done better with a simple qd insulin regimen).  He says he never misses the insulin.  no cbg record, but states cbg's vary from 65 (before lunch) to 200 (afternoon) Pt says he seldom misses the insulin.  Past Medical History  Diagnosis Date  . DIABETES MELLITUS, TYPE II 09/22/2006  . HYPOGONADISM, MALE 01/15/2007  . HYPERCHOLESTEROLEMIA 07/02/2008  . GOUT 09/22/2006  . HYPERTENSION 05/31/2009  . CHF 06/19/2008  . ALLERGIC RHINITIS 07/02/2008  . 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 Rheumatoid arthritis(714.0) 09/22/2006  . COUGH DUE TO ACE INHIBITORS 10/05/2008  . Glaucoma   . Atrial fibrillation   . ED (erectile dysfunction)   . PUD (peptic ulcer disease)     Past Surgical History  Procedure Laterality Date  . Spine surgery      Lumbar Spine Surgery  . Ankle fusion      left  . Lower arterial  02/22/2005  . Ct sinus ltd w/o cm  04/06/1997  . Electrocardiogram  09/29/2005    History   Social History  . Marital Status: Married    Spouse Name: N/A    Number of Children: N/A  . Years of Education: N/A   Occupational History  . 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 Smoking status: Former Smoker    Quit date: 02/28/2000  . Smokeless tobacco: Not on file  . Alcohol Use: No  . Drug Use: No  . Sexual Activity: Not on file   Other Topics Concern  . Not on file   Social History Narrative   Regular exercise-yes    Current Outpatient Prescriptions on File Prior to Visit  Medication Sig Dispense Refill  . allopurinol (ZYLOPRIM) 300 MG tablet Take 1 tablet (300 mg total) by mouth daily.  30 tablet  6  .  aspirin 81 MG tablet Take 81 mg by mouth daily.        . clomiPHENE (CLOMID) 50 MG tablet 1/4 tab daily  10 tablet  2  . colchicine 0.6 MG tablet 1 pill every hour until gout is better.  Stop if diarrhea  10 tablet  2  . gabapentin (NEURONTIN) 300 MG capsule Take 1 capsule (300 mg total) by mouth 3 (three) times daily.  90 capsule  11  . insulin NPH Human (HUMULIN N,NOVOLIN N) 100 UNIT/ML injection Inject 70 Units into the skin every morning.       . Insulin Syringe-Needle U-100 (B-D INSULIN SYRINGE 2CC/27.5G) 27.5G X 5/8" 2 ML MISC Use as directed once daily       . meloxicam (MOBIC) 15 MG tablet Take 1 tablet (15 mg total) by mouth daily.  30 tablet  0  . simvastatin (ZOCOR) 80 MG tablet Take 1 tablet (80 mg total) by mouth at bedtime.  90 tablet  2  . tadalafil (CIALIS) 20 MG tablet Take 1 tablet (20 mg total) by mouth as needed.  10 tablet  2  . venlafaxine (EFFEXOR-XR) 75 MG 24 hr capsule Take 1 capsule (75 mg total) by mouth daily.  30 capsule  11   No current facility-administered medications on  file prior to visit.    Allergies  Allergen Reactions  . Penicillins   . Terbinafine Hcl   . Zestril [Lisinopril] Cough    Family History  Problem Relation Age of Onset  . Cancer Neg Hx   . Heart disease Other     FH of CAD    BP 124/64  Pulse 85  Temp(Src) 98.8 F (37.1 C) (Oral)  Ht 5\' 11"  (1.803 m)  Wt 224 lb (101.606 kg)  BMI 31.26 kg/m2  SpO2 97%  Review of Systems Denies LOC and weight change    Objective:   Physical Exam VITAL SIGNS:  See vs page GENERAL: no distress Pulses: dorsalis pedis intact bilat.  Feet: no deformity. normal color and temp. Trace bilat leg edema. There is bilateral onychomycosis.  Skin: no ulcer on the feet.  Neuro: sensation is intact to touch on the feet, but decreased from normal.   Lab Results  Component Value Date   HGBA1C 8.9* 10/22/2013      Assessment & Plan:  DM: moderate exacerbation Noncompliance with cbg recording, worse:  I'll work around this as best I can. HTN: well-controlled   Patient is advised the following: Patient Instructions  blood tests are being requested for you today.  We'll contact you with results. check your blood sugar twice a day.  vary the time of day when you check, between before the 3 meals, and at bedtime.  also check if you have symptoms of your blood sugar being too high or too low.  please keep a record of the readings and bring it to your next appointment here.  You can write it on any piece of paper.  please call us sooner if your blood sugar goes below 70, or if you have a lot of readings over 200. Here is a new meter.  i have sent a prescription to your pharmacy, for strips.  On this type of insulin schedule, you should eat meals (especially breakfast) on a regular schedule.  If a meal is missed or significantly delayed, your blood sugar could go low.   Please increase the insulin to 70 units each morning.

## 2013-10-24 ENCOUNTER — Telehealth: Payer: Self-pay

## 2013-10-24 ENCOUNTER — Telehealth: Payer: Self-pay | Admitting: Endocrinology

## 2013-10-24 NOTE — Telephone Encounter (Signed)
Patient is returning your call.  

## 2013-10-24 NOTE — Telephone Encounter (Signed)
Pt advised of lab result.s A1C was 8.9. Pt instructed to reduce insulin to 70 units and to eat meals on a regular schedule due to blood sugar possibly dropping.

## 2013-10-24 NOTE — Telephone Encounter (Signed)
Requested call back to discuss labs.

## 2013-11-04 ENCOUNTER — Ambulatory Visit (AMBULATORY_SURGERY_CENTER): Payer: Self-pay

## 2013-11-04 VITALS — Ht 71.5 in | Wt 226.0 lb

## 2013-11-04 DIAGNOSIS — Z1211 Encounter for screening for malignant neoplasm of colon: Secondary | ICD-10-CM

## 2013-11-04 MED ORDER — SUPREP BOWEL PREP KIT 17.5-3.13-1.6 GM/177ML PO SOLN: 1.0000 | Freq: Once | ORAL | Status: DC

## 2013-11-04 NOTE — Progress Notes (Signed)
No allergies to eggs or soy No past problems with anesthesia No home oxygen No diet/weight loss meds  No email

## 2013-11-10 ENCOUNTER — Encounter: Payer: Self-pay | Admitting: Internal Medicine

## 2013-11-10 ENCOUNTER — Emergency Department (HOSPITAL_COMMUNITY)
Admission: EM | Admit: 2013-11-10 | Discharge: 2013-11-10 | Disposition: A | Discharge status: Home / Self Care | Payer: BC Managed Care – PPO | Attending: Emergency Medicine | Admitting: Emergency Medicine

## 2013-11-10 ENCOUNTER — Telehealth: Payer: Self-pay | Admitting: Endocrinology

## 2013-11-10 ENCOUNTER — Emergency Department (HOSPITAL_COMMUNITY): Payer: BC Managed Care – PPO

## 2013-11-10 ENCOUNTER — Encounter (HOSPITAL_COMMUNITY): Payer: Self-pay | Admitting: Emergency Medicine

## 2013-11-10 DIAGNOSIS — I10 Essential (primary) hypertension: Secondary | ICD-10-CM | POA: Diagnosis not present

## 2013-11-10 DIAGNOSIS — Z79899 Other long term (current) drug therapy: Secondary | ICD-10-CM | POA: Insufficient documentation

## 2013-11-10 DIAGNOSIS — Z87891 Personal history of nicotine dependence: Secondary | ICD-10-CM | POA: Insufficient documentation

## 2013-11-10 DIAGNOSIS — E119 Type 2 diabetes mellitus without complications: Secondary | ICD-10-CM | POA: Diagnosis not present

## 2013-11-10 DIAGNOSIS — Z8669 Personal history of other diseases of the nervous system and sense organs: Secondary | ICD-10-CM | POA: Diagnosis not present

## 2013-11-10 DIAGNOSIS — Z794 Long term (current) use of insulin: Secondary | ICD-10-CM | POA: Diagnosis not present

## 2013-11-10 DIAGNOSIS — M069 Rheumatoid arthritis, unspecified: Secondary | ICD-10-CM | POA: Insufficient documentation

## 2013-11-10 DIAGNOSIS — L02419 Cutaneous abscess of limb, unspecified: Secondary | ICD-10-CM | POA: Insufficient documentation

## 2013-11-10 DIAGNOSIS — M7989 Other specified soft tissue disorders: Secondary | ICD-10-CM | POA: Insufficient documentation

## 2013-11-10 DIAGNOSIS — I509 Heart failure, unspecified: Secondary | ICD-10-CM | POA: Diagnosis not present

## 2013-11-10 DIAGNOSIS — L03031 Cellulitis of right toe: Secondary | ICD-10-CM

## 2013-11-10 DIAGNOSIS — Z880 Allergy status to penicillin: Secondary | ICD-10-CM | POA: Diagnosis not present

## 2013-11-10 DIAGNOSIS — Z87448 Personal history of other diseases of urinary system: Secondary | ICD-10-CM | POA: Insufficient documentation

## 2013-11-10 DIAGNOSIS — Z8719 Personal history of other diseases of the digestive system: Secondary | ICD-10-CM | POA: Insufficient documentation

## 2013-11-10 DIAGNOSIS — L02619 Cutaneous abscess of unspecified foot: Secondary | ICD-10-CM | POA: Insufficient documentation

## 2013-11-10 DIAGNOSIS — L03119 Cellulitis of unspecified part of limb: Principal | ICD-10-CM

## 2013-11-10 DIAGNOSIS — L03115 Cellulitis of right lower limb: Secondary | ICD-10-CM

## 2013-11-10 DIAGNOSIS — L03039 Cellulitis of unspecified toe: Secondary | ICD-10-CM | POA: Insufficient documentation

## 2013-11-10 DIAGNOSIS — Z7982 Long term (current) use of aspirin: Secondary | ICD-10-CM | POA: Diagnosis not present

## 2013-11-10 LAB — CBC WITH DIFFERENTIAL/PLATELET
Basophils Absolute: 0 10*3/uL (ref 0.0–0.1)
Basophils Relative: 1 % (ref 0–1)
Eosinophils Absolute: 0.3 10*3/uL (ref 0.0–0.7)
Eosinophils Relative: 6 % — ABNORMAL HIGH (ref 0–5)
HCT: 33.6 % — ABNORMAL LOW (ref 39.0–52.0)
Hemoglobin: 11.5 g/dL — ABNORMAL LOW (ref 13.0–17.0)
Lymphocytes Relative: 33 % (ref 12–46)
Lymphs Abs: 1.4 10*3/uL (ref 0.7–4.0)
MCH: 28 pg (ref 26.0–34.0)
MCHC: 34.2 g/dL (ref 30.0–36.0)
MCV: 82 fL (ref 78.0–100.0)
Monocytes Absolute: 0.3 10*3/uL (ref 0.1–1.0)
Monocytes Relative: 8 % (ref 3–12)
Neutro Abs: 2.2 10*3/uL (ref 1.7–7.7)
Neutrophils Relative %: 52 % (ref 43–77)
Platelets: 232 10*3/uL (ref 150–400)
RBC: 4.1 MIL/uL — ABNORMAL LOW (ref 4.22–5.81)
RDW: 12.7 % (ref 11.5–15.5)
WBC: 4.2 10*3/uL (ref 4.0–10.5)

## 2013-11-10 LAB — BASIC METABOLIC PANEL
Anion gap: 13 (ref 5–15)
BUN: 13 mg/dL (ref 6–23)
CO2: 23 mEq/L (ref 19–32)
Calcium: 8.9 mg/dL (ref 8.4–10.5)
Chloride: 102 mEq/L (ref 96–112)
Creatinine, Ser: 0.89 mg/dL (ref 0.50–1.35)
GFR calc Af Amer: >90 mL/min (ref >90)
GFR calc non Af Amer: >90 mL/min (ref >90)
Glucose, Bld: 243 mg/dL — ABNORMAL HIGH (ref 70–99)
Potassium: 4.4 mEq/L (ref 3.7–5.3)
Sodium: 138 mEq/L (ref 137–147)

## 2013-11-10 LAB — CBG MONITORING, ED: Glucose-Capillary: 334 mg/dL — ABNORMAL HIGH (ref 70–99)

## 2013-11-10 LAB — I-STAT CG4 LACTIC ACID, ED: Lactic Acid, Venous: 2.19 mmol/L (ref 0.5–2.2)

## 2013-11-10 MED ORDER — CLINDAMYCIN HCL 300 MG PO CAPS
600.0000 mg | ORAL_CAPSULE | Freq: Once | ORAL | Status: AC
  Administered 2013-11-10: 600 mg via ORAL
  Filled 2013-11-10: qty 4
  Filled 2013-11-10: qty 2

## 2013-11-10 MED ORDER — CLINDAMYCIN HCL 300 MG PO CAPS: 300.0000 mg | ORAL_CAPSULE | Freq: Four times a day (QID) | ORAL | Status: DC

## 2013-11-10 MED ORDER — TRAMADOL HCL 50 MG PO TABS: 50.0000 mg | ORAL_TABLET | Freq: Four times a day (QID) | ORAL | Status: DC | PRN

## 2013-11-10 NOTE — ED Notes (Signed)
Pt from home with reports of right foot and leg swelling since Saturday, pt also reports blister to great toe on right foot that has since "popped" and area has gotten bigger. Pt denies any pain when sitting but states pain increases when standing and walking. Nad noted. Pt is diabetic. axox4

## 2013-11-10 NOTE — ED Provider Notes (Signed)
Medical screening examination/treatment/procedure(s) were performed by non-physician practitioner and as supervising physician I was immediately available for consultation/collaboration.   EKG Interpretation None        Delice Bison Kevaughn Ewing, DO 11/10/13 1605

## 2013-11-10 NOTE — ED Provider Notes (Signed)
CSN: [id number]     Arrival date & time 11/10/13  0086 History   First MD Initiated Contact with Patient 11/10/13 0912     Chief Complaint  Patient presents with  . Leg Swelling     (Consider location/radiation/quality/duration/timing/severity/associated sxs/prior Treatment) HPI Aaron Fox is a 58 y.o. male with history of DM type II, gout, HTN, and CHF who presents with  Chief Complaint  Patient presents with  . Leg Swelling   that started about 1 week ago.  Pt states he noticed a small spot on the tip of his right great toe he thinks may have been due to a spider bite after putting his feet in his shoes.  States since then, he has noticed waxing and waning swelling in his right foot and lower leg with redness and warmth in lower leg and foot.  States the area on his great toe did bleed a small amount but has stopped. States pain is gradually worsening to and aching throb, 10/10 with ambulation, resolves with rest and elevation of right lower leg.  States he has taken [facility] powder with minimal relief.  Denies fever, n/v/d. Denies previous hx of cellulitis or osteomyelitis.  States his CBG normally is in 80s-100s but states he did just eat breakfast PTA as he was unsure how long he would be in the ED. States he did take his medications this morning.    PCP: Dr. Renato Shin.   Pt does not have a podiatrist.    Past Medical History  Diagnosis Date  . DIABETES MELLITUS, TYPE II 09/22/2006  . HYPOGONADISM, MALE 01/15/2007  . HYPERCHOLESTEROLEMIA 07/02/2008  . GOUT 09/22/2006  . HYPERTENSION 05/31/2009  . CHF 06/19/2008  . ALLERGIC RHINITIS 07/02/2008  . 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 Rheumatoid arthritis(714.0) 09/22/2006  . COUGH DUE TO ACE INHIBITORS 10/05/2008  . Glaucoma   . Atrial fibrillation   . ED (erectile dysfunction)   . PUD (peptic ulcer disease)    Past Surgical History  Procedure Laterality Date  . Spine surgery      Lumbar Spine Surgery  . Ankle fusion     left  . Lower arterial  02/22/2005  . Ct sinus ltd w/o cm  04/06/1997  . Electrocardiogram  09/29/2005  . Lymph node biopsy     Family History  Problem Relation Age of Onset  . Cancer Neg Hx   . Colon cancer Neg Hx   . Heart disease Other     FH of CAD   History  Substance Use Topics  . Smoking status: Former Smoker    Quit date: 02/28/2000  . Smokeless tobacco: Never Used  . Alcohol Use: No    Review of Systems  Constitutional: Negative for fever and chills.  Respiratory: Negative for cough and shortness of breath.   Cardiovascular: Positive for leg swelling (right leg). Negative for chest pain and palpitations.  Gastrointestinal: Negative for nausea, vomiting, abdominal pain and diarrhea.  Genitourinary: Negative for dysuria, urgency, hematuria and flank pain.  Musculoskeletal: Positive for arthralgias, gait problem ( pain in right foot and leg with ambulation), joint swelling and myalgias.  Skin: Positive for color change and wound. Negative for pallor and rash.  All other systems reviewed and are negative.     Allergies  Penicillins; Terbinafine hcl; and Zestril  Home Medications   Prior to Admission medications   Medication Sig Start Date End Date Taking? Authorizing Provider  aspirin 81 MG tablet Take 81 mg by  mouth daily.     Yes Historical Provider, MD  clomiPHENE (CLOMID) 50 MG tablet Take 12.5 mg by mouth daily. Take 1/4 tablet daily   Yes Historical Provider, MD  gabapentin (NEURONTIN) 300 MG capsule Take 300 mg by mouth at bedtime.   Yes Historical Provider, MD  insulin NPH Human (HUMULIN N,NOVOLIN N) 100 UNIT/ML injection Inject 70 Units into the skin every morning.    Yes Historical Provider, MD  clindamycin (CLEOCIN) 300 MG capsule Take 1 capsule (300 mg total) by mouth 4 (four) times daily. X 10 days 11/10/13   Noland Fordyce, PA-C  glucose blood (ONE TOUCH ULTRA TEST) test strip 1 each by Other route 2 (two) times daily. And lancets 2/day 250.03 10/22/13    Renato Shin, MD  Insulin Syringe-Needle U-100 (B-D INSULIN SYRINGE 2CC/27.5G) 27.5G X 5/8" 2 ML MISC Use as directed once daily     Historical Provider, MD  traMADol (ULTRAM) 50 MG tablet Take 1 tablet (50 mg total) by mouth every 6 (six) hours as needed. 11/10/13   Noland Fordyce, PA-C   BP 149/73  Pulse 65  Temp(Src) 98.5 F (36.9 C) (Oral)  Ht 5' 11.5" (1.816 m)  Wt 225 lb (102.059 kg)  BMI 30.95 kg/m2  SpO2 99% Physical Exam  Nursing note and vitals reviewed. Constitutional: He appears well-developed and well-nourished.  HENT:  Head: Normocephalic and atraumatic.  Eyes: Conjunctivae are normal. No scleral icterus.  Neck: Normal range of motion.  Cardiovascular: Normal rate, regular rhythm and normal heart sounds.   Pulmonary/Chest: Effort normal and breath sounds normal. No respiratory distress. He has no wheezes.  Abdominal: Soft. He exhibits no distension. There is no tenderness.  Musculoskeletal: Normal range of motion. He exhibits edema ( mild edema of right lower leg and right great toe) and tenderness.  Neurological: He is alert.  Skin: Skin is warm and dry. There is erythema.  Right lower leg: erythema and warmth Right foot: erythema, warmth.  Toes-thick yellowed nails. Great toe-area of white-yellow skin with underlying fluctuance. Centralized area of dried blood. No active bleeding or drainage.           ED Course  Procedures (including critical care time) Labs Review Labs Reviewed  CBC WITH DIFFERENTIAL - Abnormal; Notable for the following:    RBC 4.10 (*)    Hemoglobin 11.5 (*)    HCT 33.6 (*)    All other components within normal limits  BASIC METABOLIC PANEL - Abnormal; Notable for the following:    Glucose, Bld 243 (*)    All other components within normal limits  CBG MONITORING, ED - Abnormal; Notable for the following:    Glucose-Capillary 334 (*)    All other components within normal limits  CULTURE, BLOOD (ROUTINE X 2)  CULTURE, BLOOD (ROUTINE  X 2)  I-STAT CG4 LACTIC ACID, ED    Imaging Review Dg Foot Complete Right  11/10/2013   CLINICAL DATA:  Soft tissue ulceration at the plantar surface of the first toe  EXAM: RIGHT FOOT COMPLETE - 3+ VIEW  COMPARISON:  12/26/2005  FINDINGS: There is no evidence of fracture or dislocation. There is no evidence of arthropathy or other focal bone abnormality. Soft tissue deformity is noted within the plantar soft subjacent to the distal phalanx of the great toe. Plantar calcaneal spurring is noted. No radiopaque foreign body. No overt osseous destruction on the oblique view, possible cortical discontinuity at the base of the distal phalanx of the great toe it is felt  to most likely be artifactual, as there is no evidence for fracture on other views.  IMPRESSION: Soft tissue gas within the plantar soft tissues of the great toe. Penetrating trauma, abscess, or ulceration could have this appearance. No plain radiographic evidence for osteomyelitis. Consider MRI with contrast if further evaluation for possible osteomyelitis is needed.   Electronically Signed   By: Conchita Paris M.D.   On: 11/10/2013 10:30     EKG Interpretation None      MDM   Final diagnoses:  Cellulitis of right lower leg  Cellulitis of great toe of right foot    Pt is a 58yo with hx of DM type 2 presenting to ED with right foot and lower leg pain, swelling, and redness, waxing and waning x1 week. Right great toe: tender, edematous, fluctuance with what appears to be underlying clear fluid. No active discharge or bleeding.  Plain films: evidence of abscess or ulceration, no evidence of osteomyelitis.   Labs: unremarkable besides mild hyperglycemia, pt states he ate breakfast PTA and took his medications.  Pt is afebrile, non-toxic appearing. Discussed pt with Dr. Leonides Schanz who agrees pt may be discharged home with PO antibiotics, clindamycin. First dose given in ED. Advised to f/u in 3 days with PCP for recheck of symptoms. Home care  instructions provided. Return precautions provided. Pt verbalized understanding and agreement with tx plan.     Noland Fordyce, PA-C 11/10/13 1253

## 2013-11-10 NOTE — Telephone Encounter (Signed)
Patient is going to the ER his legs are swollen and sore to the touch, been like this the entire weekend. He wanted you to know.

## 2013-11-10 NOTE — Discharge Instructions (Signed)
Be sure to soak your foot in warm soapy water 3 times a day for 5-10 minutes to help with infection. Be sure to then dry foot completely and keep elevated to help with swelling. Please take all of your antibiotic as prescribed even if your foot is looking and feeling better. Please follow up with your primary care provider in 3-4 days for wound recheck. If symptoms worsening despite use of antibiotics, please seek medical care immediately, you may return to the ER for further evaluation and treatment. See below for further instructions.

## 2013-11-10 NOTE — ED Notes (Signed)
Pt c/o swelling to right foot and leg intermittent last Saturday. Pt reports recently noticed a spot on his foot and beginning to have pain now. Pt has history of diabetes, unsure if he was bit by a spider.

## 2013-11-10 NOTE — Telephone Encounter (Signed)
Noted  

## 2013-11-16 LAB — CULTURE, BLOOD (ROUTINE X 2)
Culture: NO GROWTH
Culture: NO GROWTH

## 2013-11-17 ENCOUNTER — Telehealth: Payer: Self-pay | Admitting: Internal Medicine

## 2013-11-17 NOTE — Telephone Encounter (Signed)
Pt canceled his Colonoscopy for tomorrow because he called the answering service last Thursday when we were in the staff meeting to cancel (and we verified he did call in on Thursday).  He received a letter from our office that his Insurance will not cover the procedure and he cannot afford it.  Would you like pt charged the cancellation fee?

## 2013-11-17 NOTE — Telephone Encounter (Signed)
No charge. 

## 2013-11-18 ENCOUNTER — Encounter: Payer: BC Managed Care – PPO | Admitting: Internal Medicine

## 2013-11-21 ENCOUNTER — Encounter: Payer: Self-pay | Admitting: Endocrinology

## 2013-11-21 ENCOUNTER — Ambulatory Visit (INDEPENDENT_AMBULATORY_CARE_PROVIDER_SITE_OTHER): Payer: BC Managed Care – PPO | Admitting: Endocrinology

## 2013-11-21 VITALS — BP 116/68 | HR 88 | Temp 97.4°F | Ht 71.5 in | Wt 223.0 lb

## 2013-11-21 DIAGNOSIS — E11621 Type 2 diabetes mellitus with foot ulcer: Secondary | ICD-10-CM

## 2013-11-21 DIAGNOSIS — Z23 Encounter for immunization: Secondary | ICD-10-CM

## 2013-11-21 DIAGNOSIS — L97509 Non-pressure chronic ulcer of other part of unspecified foot with unspecified severity: Secondary | ICD-10-CM

## 2013-11-21 DIAGNOSIS — L97519 Non-pressure chronic ulcer of other part of right foot with unspecified severity: Principal | ICD-10-CM

## 2013-11-21 DIAGNOSIS — E1169 Type 2 diabetes mellitus with other specified complication: Secondary | ICD-10-CM

## 2013-11-21 NOTE — Progress Notes (Signed)
Subjective:    Patient ID: Aaron Fox, male    DOB: [birth year], 58 y.o.   MRN: [id number]  HPI Pt returns for f/u of diabetes mellitus:  DM type: 1 Dx'ed: 2426 Complications: sensory neuropathy, retinopathy and nephropathy Therapy: insulin since GDM: never DKA: never Severe hypoglycemia: never Pancreatitis: never Other info: he can only afford human insulin; he has done better with a simple qd insulin regimen  Interval history:  Pt states 2 weeks of moderate pain and swelling of the RLE, worst at the great toe, but also present at the entire foot and leg.  He was seen in ER, and rx'ed abx.  Since then, he says sxs are approx 50% better.  no cbg record, but states cbg's are well-controlled.    Past Medical History  Diagnosis Date  . DIABETES MELLITUS, TYPE II 09/22/2006  . HYPOGONADISM, MALE 01/15/2007  . HYPERCHOLESTEROLEMIA 07/02/2008  . GOUT 09/22/2006  . HYPERTENSION 05/31/2009  . CHF 06/19/2008  . ALLERGIC RHINITIS 07/02/2008  . 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 Rheumatoid arthritis(714.0) 09/22/2006  . COUGH DUE TO ACE INHIBITORS 10/05/2008  . Glaucoma   . Atrial fibrillation   . ED (erectile dysfunction)   . PUD (peptic ulcer disease)     Past Surgical History  Procedure Laterality Date  . Spine surgery      Lumbar Spine Surgery  . Ankle fusion      left  . Lower arterial  02/22/2005  . Ct sinus ltd w/o cm  04/06/1997  . Electrocardiogram  09/29/2005  . Lymph node biopsy      History   Social History  . Marital Status: Married    Spouse Name: N/A    Number of Children: N/A  . Years of Education: N/A   Occupational History  . 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 Smoking status: Former Smoker    Quit date: 02/28/2000  . Smokeless tobacco: Never Used  . Alcohol Use: No  . Drug Use: No  . Sexual Activity: Not on file   Other Topics Concern  . Not on file   Social History Narrative   Regular exercise-yes      Current Outpatient Prescriptions on File Prior to Visit  Medication Sig Dispense Refill  . aspirin 81 MG tablet Take 81 mg by mouth daily.        . clindamycin (CLEOCIN) 300 MG capsule Take 1 capsule (300 mg total) by mouth 4 (four) times daily. X 10 days  40 capsule  0  . clomiPHENE (CLOMID) 50 MG tablet Take 12.5 mg by mouth daily. Take 1/4 tablet daily      . gabapentin (NEURONTIN) 300 MG capsule Take 300 mg by mouth at bedtime.      Marland Kitchen glucose blood (ONE TOUCH ULTRA TEST) test strip 1 each by Other route 2 (two) times daily. And lancets 2/day 250.03  100 each  12  . insulin NPH Human (HUMULIN N,NOVOLIN N) 100 UNIT/ML injection Inject 70 Units into the skin every morning.       . Insulin Syringe-Needle U-100 (B-D INSULIN SYRINGE 2CC/27.5G) 27.5G X 5/8" 2 ML MISC Use as directed once daily       . traMADol (ULTRAM) 50 MG tablet Take 1 tablet (50 mg total) by mouth every 6 (six) hours as needed.  15 tablet  0   No current facility-administered medications on file prior to visit.    Allergies  Allergen Reactions  . Penicillins Swelling    Swelling of the hands   . Terbinafine Hcl Hives  . Zestril [Lisinopril] Cough    Family History  Problem Relation Age of Onset  . Cancer Neg Hx   . Colon cancer Neg Hx   . Heart disease Other     FH of CAD    BP 116/68  Pulse 88  Temp(Src) 97.4 F (36.3 C) (Oral)  Ht 5' 11.5" (1.816 m)  Wt 223 lb (101.152 kg)  BMI 30.67 kg/m2  SpO2 97%   Review of Systems Denies fever.  No change in chronic numbness of the feet.    Objective:   Physical Exam VITAL SIGNS:  See vs page. GENERAL: no distress. Right foot: 1 cm shallow ulcer at the great toe, surrounded by a callus.  DP pulse is present.  Normal color and temp. No fluctuance or drainage. 1+ right leg edema.   Lab Results  Component Value Date   HGBA1C 8.9* 10/22/2013      Assessment & Plan:  foot ulcer, new DM: apparently well-controlled. Same insulin is advised.    Neuropathy: clinically unchanged   Patient is advised the following: Patient Instructions  Please see a wound specialist.  you will receive a phone call, about a day and time for an appointment. You will get better much faster if you elevate your foot above the rest of your body. check your blood sugar twice a day.  vary the time of day when you check, between before the 3 meals, and at bedtime.  also check if you have symptoms of your blood sugar being too high or too low.  please keep a record of the readings and bring it to your next appointment here.  You can write it on any piece of paper.  please call us sooner if your blood sugar goes below 70, or if you have a lot of readings over 200. On this type of insulin schedule, you should eat meals (especially breakfast) on a regular schedule.  If a meal is missed or significantly delayed, your blood sugar could go low.

## 2013-11-21 NOTE — Patient Instructions (Addendum)
Please see a wound specialist.  you will receive a phone call, about a day and time for an appointment. You will get better much faster if you elevate your foot above the rest of your body. check your blood sugar twice a day.  vary the time of day when you check, between before the 3 meals, and at bedtime.  also check if you have symptoms of your blood sugar being too high or too low.  please keep a record of the readings and bring it to your next appointment here.  You can write it on any piece of paper.  please call us sooner if your blood sugar goes below 70, or if you have a lot of readings over 200. On this type of insulin schedule, you should eat meals (especially breakfast) on a regular schedule.  If a meal is missed or significantly delayed, your blood sugar could go low.

## 2013-11-24 NOTE — Addendum Note (Signed)
Addended by: Moody Bruins E on: 11/24/2013 09:40 AM   Modules accepted: Orders

## 2013-11-27 ENCOUNTER — Telehealth: Payer: Self-pay | Admitting: Endocrinology

## 2013-11-27 NOTE — Telephone Encounter (Signed)
[facility] contacted pt about appointment. Please see referral notes.

## 2013-11-27 NOTE — Telephone Encounter (Signed)
What is the status regarding the wound center referral?

## 2013-12-01 ENCOUNTER — Encounter (HOSPITAL_BASED_OUTPATIENT_CLINIC_OR_DEPARTMENT_OTHER): Payer: BC Managed Care – PPO | Attending: Plastic Surgery

## 2013-12-01 DIAGNOSIS — L97419 Non-pressure chronic ulcer of right heel and midfoot with unspecified severity: Secondary | ICD-10-CM | POA: Insufficient documentation

## 2013-12-01 DIAGNOSIS — E11621 Type 2 diabetes mellitus with foot ulcer: Secondary | ICD-10-CM | POA: Diagnosis present

## 2013-12-02 NOTE — Consult Note (Signed)
Aaron Fox, Aaron Fox           ACCOUNT NO.:  [id number]  MEDICAL RECORD NO.:  [id number]  LOCATION:  FOOT                         FACILITY:  [facility]  PHYSICIAN:  Irene Limbo, MD   DATE OF BIRTH:  February 16, 1956  DATE OF CONSULTATION:  12/01/2013 DATE OF DISCHARGE:                                CONSULTATION   CHIEF COMPLAINT:  Diabetic foot ulcer.  HISTORY OF PRESENT ILLNESS:  The patient is a 58 year old ambulatory male with right great toe distal phalanx ulceration has been present for over 4 weeks' time.  The patient's current wound care has been soaking of the wound.  This is his first ulceration of either foot.  He has never used diabetic shoes.  He does not have a podiatrist for routine foot care.  PAST MEDICAL HISTORY: 1. Hypercholesterolemia. 2. Gout. 3. Hypertension. 4. Congestive heart failure. 5. Diabetes mellitus. 6. Atrial fibrillation.  PAST SURGICAL HISTORY: 1. Lumbar spine surgery. 2. Ankle fusion on the left side. 3. Lymph node biopsy.  Review of his chart indicates he has had intervention labeled as "lower Arterial."  The patient states that he did have some type of intervention over 5-6 years ago for evaluating the blood flow to his legs.  He cannot provide any further detail of this.  He states the procedure was done in [place].    SOCIAL HISTORY: The patient quit smoking in 2002.  MEDICATIONS:  Aspirin, Clomid, Neurontin, insulin, tramadol.  ALLERGIES:  PENICILLIN, ZESTRIL, AND TERBINAFINE.  PHYSICAL EXAMINATION:  VITAL SIGNS:  Blood pressure is 161/70, pulse is 92, temperature is 98.7, height is 5 feet 11 inches, weight is 225 pounds.  ABI is calculated as 1.12. EXTREMITIES:  He has absent sensation over the plantar surface of the right hallux as well as first, third, and fifth metatarsals.  He has absent sensation over the calcaneus.  He does have sensation over the dorsum of the right foot.  Over the plantar surface of the right great  toe distal phalanx, there is open wound measured as 1.3 x 1.5 x 0.1 cm with surrounding hyperkeratotic tissue.  Base of the wound is granulated.  The patient has absent sensation in this area.  Scissors were used to remove the callus surrounding the open wound as well as to remove the slough present over the bases of the wound for selective debridement.  ASSESSMENT:  We will obtain up-to-date pre-albumin and hemoglobin A1c. He is referred to vascular vein specialist for ABI and TBI screening. We will institute wound care with silver collagen and he will follow up in 1 week's time.  The patient was instructed on how to complete the wound care himself.          ______________________________ Irene Limbo, MD MBA     BT/MEDQ  D:  12/01/2013  T:  12/02/2013  Job:  [id number]

## 2013-12-04 ENCOUNTER — Ambulatory Visit (HOSPITAL_COMMUNITY)
Admission: RE | Admit: 2013-12-04 | Discharge: 2013-12-04 | Disposition: A | Discharge status: Home / Self Care | Payer: BC Managed Care – PPO | Source: Ambulatory Visit | Attending: Vascular Surgery | Admitting: Vascular Surgery

## 2013-12-04 ENCOUNTER — Other Ambulatory Visit (HOSPITAL_COMMUNITY): Payer: Self-pay | Admitting: Plastic Surgery

## 2013-12-04 ENCOUNTER — Encounter (INDEPENDENT_AMBULATORY_CARE_PROVIDER_SITE_OTHER): Payer: Self-pay

## 2013-12-04 DIAGNOSIS — S91101A Unspecified open wound of right great toe without damage to nail, initial encounter: Secondary | ICD-10-CM

## 2013-12-04 DIAGNOSIS — X58XXXA Exposure to other specified factors, initial encounter: Secondary | ICD-10-CM | POA: Diagnosis not present

## 2013-12-15 DIAGNOSIS — E11621 Type 2 diabetes mellitus with foot ulcer: Secondary | ICD-10-CM | POA: Diagnosis not present

## 2013-12-15 DIAGNOSIS — L97419 Non-pressure chronic ulcer of right heel and midfoot with unspecified severity: Secondary | ICD-10-CM | POA: Diagnosis not present

## 2013-12-16 NOTE — Progress Notes (Signed)
[facility]  NAME:  Aaron Fox, Aaron Fox                ACCOUNT NO.:  MEDICAL RECORD NO.:  [id number]      DATE OF BIRTH:  1955-04-27  PHYSICIAN:  Theodoro Kos, DO       VISIT DATE:  12/15/2013                                  OFFICE VISIT   The patient is a 59 year old gentleman who is here for a followup on his right great toe distal phalanx ulcer.  He states it has been present for over a month.  He has been soaking it and was seen in the [place] last week and given silver collagen for the area.  His past medical history is positive for high cholesterol, gout, hypertension, congestive heart failure, diabetes and atrial fibrillation.  He has had several surgeries in the past on his spine and lymph node biopsy.  His vascular studies came back without any need for intervention.  His allergies include PENICILLIN and ZESTRIL and TERBINAFINE.  Medications include Neurontin, insulin, tramadol, Clomid and aspirin.  He quit smoking over 10 years ago.  On exam, he is alert and oriented, cooperative, not in any distress.  He is pleasant.  He seems to be a good historian.  His pupils are equal. His breathing is unlabored.  His heart rate is regular.  His lower extremity pulses are present and equal bilaterally.  Vitals are noted in the chart.  Debridement was done on the distal portion of the great toe. He did have good bleeding and a lot of skin fluff that was debrided as well.  Recommendation is for Santyl, collagen without the silver, elevation, multivitamin, vitamin C, zinc, Glucerna and possible noncontact cast for placement next week if he can arrange for right.  Thank you very much.     Theodoro Kos, DO     CS/MEDQ  D:  12/15/2013  T:  12/16/2013  Job:  [id number]

## 2013-12-19 ENCOUNTER — Encounter (HOSPITAL_BASED_OUTPATIENT_CLINIC_OR_DEPARTMENT_OTHER): Payer: BC Managed Care – PPO

## 2013-12-22 DIAGNOSIS — L97419 Non-pressure chronic ulcer of right heel and midfoot with unspecified severity: Secondary | ICD-10-CM | POA: Diagnosis not present

## 2013-12-22 DIAGNOSIS — E11621 Type 2 diabetes mellitus with foot ulcer: Secondary | ICD-10-CM | POA: Diagnosis not present

## 2013-12-22 NOTE — Progress Notes (Signed)
[facility]  NAME:  Aaron Fox, Aaron Fox                ACCOUNT NO.:  MEDICAL RECORD NO.:  [id number]      DATE OF BIRTH:  02/21/1956  PHYSICIAN:  Theodoro Kos, DO       VISIT DATE:  12/22/2013                                  OFFICE VISIT   The patient is a 58 year old male who is here for followup on his right great toe diabetic foot ulcer, chronic Wagner III.  Some more debridement was done today, it looks stable, the redness is improved a little bit, but it is still there, so we are going to go ahead and do a couple of things.  He has been using Santyl.  He says his blood sugars have been improving.  His pre-albumin was within normal limits, and we checked it a month ago and his hemoglobin A1c was around 7.9.  There is no change in his medications or social history.  PHYSICAL EXAMINATION:  GENERAL:  On exam, he is alert, oriented, cooperative, and pleasant. HEENT:  Pupils equal.  Extraocular muscles are intact. LUNGS:  His breathing is unlabored. HEART:  His heart rate is regular. Pulses are present.  He has a little bit of swelling and redness, and the wound was debrided and did have good bleeding.  It does not appear to be deeper, just very poor progress.  PLAN:  We will continue with Santyl collagen, elevation, blood sugar control, protein intake, and a non-contact __________.  We will have him come back in a few days for change.  I also started him on Bactrim.     Theodoro Kos, DO     CS/MEDQ  D:  12/22/2013  T:  12/22/2013  Job:  [id number]

## 2013-12-24 DIAGNOSIS — L97419 Non-pressure chronic ulcer of right heel and midfoot with unspecified severity: Secondary | ICD-10-CM | POA: Diagnosis not present

## 2013-12-24 DIAGNOSIS — E11621 Type 2 diabetes mellitus with foot ulcer: Secondary | ICD-10-CM | POA: Diagnosis not present

## 2013-12-29 ENCOUNTER — Encounter (HOSPITAL_BASED_OUTPATIENT_CLINIC_OR_DEPARTMENT_OTHER): Payer: BC Managed Care – PPO | Attending: Plastic Surgery

## 2013-12-29 DIAGNOSIS — L97419 Non-pressure chronic ulcer of right heel and midfoot with unspecified severity: Secondary | ICD-10-CM | POA: Diagnosis not present

## 2013-12-29 DIAGNOSIS — L97519 Non-pressure chronic ulcer of other part of right foot with unspecified severity: Secondary | ICD-10-CM | POA: Diagnosis not present

## 2014-01-04 ENCOUNTER — Other Ambulatory Visit: Payer: Self-pay | Admitting: Endocrinology

## 2014-01-05 DIAGNOSIS — L97419 Non-pressure chronic ulcer of right heel and midfoot with unspecified severity: Secondary | ICD-10-CM | POA: Diagnosis not present

## 2014-01-05 DIAGNOSIS — L97519 Non-pressure chronic ulcer of other part of right foot with unspecified severity: Secondary | ICD-10-CM | POA: Diagnosis not present

## 2014-01-05 NOTE — Telephone Encounter (Signed)
Please advise if ok to refill. Med is currently listed under historical provider.  Thanks!

## 2014-01-12 DIAGNOSIS — L97519 Non-pressure chronic ulcer of other part of right foot with unspecified severity: Secondary | ICD-10-CM | POA: Diagnosis not present

## 2014-01-12 DIAGNOSIS — L97419 Non-pressure chronic ulcer of right heel and midfoot with unspecified severity: Secondary | ICD-10-CM | POA: Diagnosis not present

## 2014-01-13 NOTE — Progress Notes (Signed)
[facility]  NAME:  Aaron Fox, Aaron Fox                ACCOUNT NO.:  MEDICAL RECORD NO.:  [id number]      DATE OF BIRTH:  September 02, 1955  PHYSICIAN:  Theodoro Kos, DO       VISIT DATE:  01/12/2014                                  OFFICE VISIT   The patient had an easy cast placed last week for a Wagner 2 diabetic foot ulcer of his right great toe.  Unfortunately, he got a little sore of his dorsal aspect of his third toe.  He is doing well.  No complaints.  No change in medications or social history.  On exam, he is alert, oriented, cooperative, not in any distress.  He is very pleasant.  His breathing is unlabored.  His heart rate is regular. His foot was debrided at the great toe area and that is noted in the chart.  He had a fairly good blood flow.  His dressing is in Endoform and then easy cast.  We talked again about the importance of protein and recommended __________ do not think that he is doing protein right now based on the questions he was asking, and we will see him back in 1 week.     Theodoro Kos, DO     CS/MEDQ  D:  01/12/2014  T:  01/12/2014  Job:  [id number]

## 2014-01-19 DIAGNOSIS — L97419 Non-pressure chronic ulcer of right heel and midfoot with unspecified severity: Secondary | ICD-10-CM | POA: Diagnosis not present

## 2014-01-19 DIAGNOSIS — L97519 Non-pressure chronic ulcer of other part of right foot with unspecified severity: Secondary | ICD-10-CM | POA: Diagnosis not present

## 2014-01-26 DIAGNOSIS — L97419 Non-pressure chronic ulcer of right heel and midfoot with unspecified severity: Secondary | ICD-10-CM | POA: Diagnosis not present

## 2014-01-26 DIAGNOSIS — L97519 Non-pressure chronic ulcer of other part of right foot with unspecified severity: Secondary | ICD-10-CM | POA: Diagnosis not present

## 2014-01-27 NOTE — Progress Notes (Signed)
[facility]  NAME:  Aaron Fox, Aaron Fox           ACCOUNT NO.:  [id number]  MEDICAL RECORD NO.:  [id number]      DATE OF BIRTH:  [birth year]  PHYSICIAN:  Theodoro Kos, DO       VISIT DATE:  01/26/2014                                  OFFICE VISIT   The patient is a 58 year old black male, who is here for followup on his right great toe ulcer.  He had the EasyCast on and has done extremely well healed the area.  He has started to drink the Glucerna as well. __________ difficult time finding __________ and the pharmacist told him it had too much sugar in it.  No change in his medications, otherwise.  On exam, he is alert, oriented, and cooperative.  He is very pleasant, very pleased with his progress, no pain.  His breathing is unlabored. His heart rate is regular.  His pulse is present in the lower extremity. Good capillary refill.  The wound is healed.  At this point, we need to have him see the podiatrist which was mentioned before.  We will try and set that up for him for shoe fitting and see him back as needed.     Theodoro Kos, DO     CS/MEDQ  D:  01/26/2014  T:  01/26/2014  Job:  [id number]

## 2014-02-18 ENCOUNTER — Other Ambulatory Visit: Payer: Self-pay | Admitting: Endocrinology

## 2014-04-09 ENCOUNTER — Other Ambulatory Visit: Payer: Self-pay | Admitting: Endocrinology

## 2014-04-15 ENCOUNTER — Telehealth: Payer: Self-pay | Admitting: Endocrinology

## 2014-04-15 NOTE — Telephone Encounter (Signed)
please call patient: Ov is due.  We'll address form then.

## 2014-04-15 NOTE — Telephone Encounter (Signed)
Unable to reach pt. Will try again at a later time.  

## 2014-04-16 NOTE — Telephone Encounter (Signed)
Pt scheduled for appointment on 04/20/2014 at 830 am.

## 2014-04-20 ENCOUNTER — Ambulatory Visit (INDEPENDENT_AMBULATORY_CARE_PROVIDER_SITE_OTHER): Payer: 59 | Admitting: Endocrinology

## 2014-04-20 ENCOUNTER — Telehealth: Payer: Self-pay

## 2014-04-20 ENCOUNTER — Encounter: Payer: Self-pay | Admitting: Endocrinology

## 2014-04-20 VITALS — BP 142/96 | HR 86 | Temp 98.3°F | Ht 71.5 in | Wt 233.0 lb

## 2014-04-20 DIAGNOSIS — E1065 Type 1 diabetes mellitus with hyperglycemia: Secondary | ICD-10-CM

## 2014-04-20 DIAGNOSIS — E1049 Type 1 diabetes mellitus with other diabetic neurological complication: Secondary | ICD-10-CM

## 2014-04-20 DIAGNOSIS — M10079 Idiopathic gout, unspecified ankle and foot: Secondary | ICD-10-CM

## 2014-04-20 DIAGNOSIS — E291 Testicular hypofunction: Secondary | ICD-10-CM

## 2014-04-20 DIAGNOSIS — IMO0002 Reserved for concepts with insufficient information to code with codable children: Secondary | ICD-10-CM

## 2014-04-20 DIAGNOSIS — M109 Gout, unspecified: Secondary | ICD-10-CM

## 2014-04-20 DIAGNOSIS — I10 Essential (primary) hypertension: Secondary | ICD-10-CM

## 2014-04-20 DIAGNOSIS — E1041 Type 1 diabetes mellitus with diabetic mononeuropathy: Secondary | ICD-10-CM

## 2014-04-20 DIAGNOSIS — Z125 Encounter for screening for malignant neoplasm of prostate: Secondary | ICD-10-CM

## 2014-04-20 DIAGNOSIS — Z0189 Encounter for other specified special examinations: Secondary | ICD-10-CM

## 2014-04-20 DIAGNOSIS — Z0000 Encounter for general adult medical examination without abnormal findings: Secondary | ICD-10-CM

## 2014-04-20 MED ORDER — CLOMIPHENE CITRATE 50 MG PO TABS: 25.0000 mg | ORAL_TABLET | Freq: Every day | ORAL | Status: DC

## 2014-04-20 MED ORDER — CLOMIPHENE CITRATE 50 MG PO TABS: 12.5000 mg | ORAL_TABLET | Freq: Every day | ORAL | Status: DC

## 2014-04-20 NOTE — Patient Instructions (Addendum)
blood tests are being requested for you today.  We'll let you know about the results. Please come back for a follow-up appointment in 3 months We'll follow the blood pressure for now.   check your blood sugar twice a day.  vary the time of day when you check, between before the 3 meals, and at bedtime.  also check if you have symptoms of your blood sugar being too high or too low.  please keep a record of the readings and bring it to your next appointment here.  You can write it on any piece of paper.  please call us sooner if your blood sugar goes below 70, or if you have a lot of readings over 200.  On this type of insulin schedule, you should eat meals on a regular schedule.  If a meal is missed or significantly delayed, your blood sugar could go low.

## 2014-04-20 NOTE — Telephone Encounter (Signed)
Lab add on sheet faxed to [facility] lab.

## 2014-04-20 NOTE — Telephone Encounter (Signed)
Received a fax from pt's pharmacy requesting clarification on Clomid 50 mg. Should the pt be taking 1/2 tab daily or 1/4 tab daily?  Thanks!

## 2014-04-20 NOTE — Telephone Encounter (Signed)
Should be 1/2 tab qd.   i resent rx i have adden on testosterone to today's labs.  Please advise lab.

## 2014-04-20 NOTE — Progress Notes (Signed)
Subjective:    Patient ID: Aaron Fox, male    DOB: 06-17-1955, 59 y.o.   MRN: [id number]  HPI Pt returns for f/u of diabetes mellitus: DM type: 1 Dx'ed: 9381 Complications: sensory neuropathy, retinopathy and nephropathy Therapy: insulin since 2003 DKA: never Severe hypoglycemia: never Pancreatitis: never Other: he can only afford human insulin; he has done better with a simple qd insulin regimen. Interval history:  no cbg record, but states cbg's are often mildly low at approx 10 am.  It is highest at hs (high-100's).  Pt say he never misses the insulin.   Pt says today's BP elevation is situational. Past Medical History  Diagnosis Date  . DIABETES MELLITUS, TYPE II 09/22/2006  . HYPOGONADISM, MALE 01/15/2007  . HYPERCHOLESTEROLEMIA 07/02/2008  . GOUT 09/22/2006  . HYPERTENSION 05/31/2009  . CHF 06/19/2008  . ALLERGIC RHINITIS 07/02/2008  . 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 Rheumatoid arthritis(714.0) 09/22/2006  . COUGH DUE TO ACE INHIBITORS 10/05/2008  . Glaucoma   . Atrial fibrillation   . ED (erectile dysfunction)   . PUD (peptic ulcer disease)     Past Surgical History  Procedure Laterality Date  . Spine surgery      Lumbar Spine Surgery  . Ankle fusion      left  . Lower arterial  02/22/2005  . Ct sinus ltd w/o cm  04/06/1997  . Electrocardiogram  09/29/2005  . Lymph node biopsy      History   Social History  . Marital Status: Married    Spouse Name: N/A  . Number of Children: N/A  . Years of Education: N/A   Occupational History  . 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 Smoking status: Former Smoker    Quit date: 02/28/2000  . Smokeless tobacco: Never Used  . Alcohol Use: No  . Drug Use: No  . Sexual Activity: Not on file   Other Topics Concern  . Not on file   Social History Narrative   Regular exercise-yes    Current Outpatient Prescriptions on File Prior to Visit  Medication Sig Dispense Refill    . aspirin 81 MG tablet Take 81 mg by mouth daily.      Marland Kitchen gabapentin (NEURONTIN) 300 MG capsule Take 300 mg by mouth at bedtime.    Marland Kitchen glucose blood (ONE TOUCH ULTRA TEST) test strip 1 each by Other route 2 (two) times daily. And lancets 2/day 250.03 100 each 12  . insulin NPH Human (HUMULIN N,NOVOLIN N) 100 UNIT/ML injection Inject 70 Units into the skin every morning.     . Insulin Syringe-Needle U-100 (B-D INSULIN SYRINGE 2CC/27.5G) 27.5G X 5/8" 2 ML MISC Use as directed once daily     . traMADol (ULTRAM) 50 MG tablet Take 1 tablet (50 mg total) by mouth every 6 (six) hours as needed. 15 tablet 0   No current facility-administered medications on file prior to visit.    Allergies  Allergen Reactions  . Penicillins Swelling    Swelling of the hands   . Terbinafine Hcl Hives  . Zestril [Lisinopril] Cough    Family History  Problem Relation Age of Onset  . Cancer Neg Hx   . Colon cancer Neg Hx   . Heart disease Other     FH of CAD    BP 142/96 mmHg  Pulse 86  Temp(Src) 98.3 F (36.8 C) (Oral)  Ht 5' 11.5" (1.816 m)  Wt  233 lb (105.688 kg)  BMI 32.05 kg/m2  SpO2 94%  Review of Systems He denies LOC.  He has gained weight.      Objective:   Physical Exam VITAL SIGNS:  See vs page GENERAL: no distress Pulses: dorsalis pedis intact bilat.   MSK: no deformity of the feet CV: trace bilat leg edema Skin:  no ulcer on the feet.  normal color and temp on the feet. Neuro: sensation is intact to touch on the feet, but severely decreased from normal Ext: There is bilateral onychomycosis of the toenails.        Assessment & Plan:  DM: uncertain control Side-effect of rx: hypoglycemia. HTN: situational.   Patient is advised the following: Patient Instructions  blood tests are being requested for you today.  We'll let you know about the results. Please come back for a follow-up appointment in 3 months We'll follow the blood pressure for now.   check your blood sugar  twice a day.  vary the time of day when you check, between before the 3 meals, and at bedtime.  also check if you have symptoms of your blood sugar being too high or too low.  please keep a record of the readings and bring it to your next appointment here.  You can write it on any piece of paper.  please call us sooner if your blood sugar goes below 70, or if you have a lot of readings over 200.  On this type of insulin schedule, you should eat meals on a regular schedule.  If a meal is missed or significantly delayed, your blood sugar could go low.

## 2014-05-01 ENCOUNTER — Telehealth: Payer: Self-pay

## 2014-05-01 NOTE — Telephone Encounter (Signed)
On 04/20/2014 pt what here for a ov and our lab was closed. Pt will have labs drawn at his 3 month follow up.

## 2014-05-21 ENCOUNTER — Encounter: Payer: Self-pay | Admitting: Endocrinology

## 2014-05-21 ENCOUNTER — Ambulatory Visit: Payer: 59 | Admitting: Endocrinology

## 2014-05-21 ENCOUNTER — Ambulatory Visit (INDEPENDENT_AMBULATORY_CARE_PROVIDER_SITE_OTHER): Payer: 59 | Admitting: Endocrinology

## 2014-05-21 VITALS — BP 134/80 | HR 78 | Temp 98.4°F | Ht 71.5 in | Wt 229.0 lb

## 2014-05-21 DIAGNOSIS — M10079 Idiopathic gout, unspecified ankle and foot: Secondary | ICD-10-CM | POA: Diagnosis not present

## 2014-05-21 DIAGNOSIS — IMO0002 Reserved for concepts with insufficient information to code with codable children: Secondary | ICD-10-CM

## 2014-05-21 DIAGNOSIS — I10 Essential (primary) hypertension: Secondary | ICD-10-CM | POA: Diagnosis not present

## 2014-05-21 DIAGNOSIS — E291 Testicular hypofunction: Secondary | ICD-10-CM | POA: Diagnosis not present

## 2014-05-21 DIAGNOSIS — M109 Gout, unspecified: Secondary | ICD-10-CM

## 2014-05-21 DIAGNOSIS — Z0189 Encounter for other specified special examinations: Secondary | ICD-10-CM

## 2014-05-21 DIAGNOSIS — E1041 Type 1 diabetes mellitus with diabetic mononeuropathy: Secondary | ICD-10-CM

## 2014-05-21 DIAGNOSIS — Z125 Encounter for screening for malignant neoplasm of prostate: Secondary | ICD-10-CM

## 2014-05-21 DIAGNOSIS — Z0000 Encounter for general adult medical examination without abnormal findings: Secondary | ICD-10-CM

## 2014-05-21 DIAGNOSIS — E1065 Type 1 diabetes mellitus with hyperglycemia: Principal | ICD-10-CM

## 2014-05-21 DIAGNOSIS — E1049 Type 1 diabetes mellitus with other diabetic neurological complication: Secondary | ICD-10-CM

## 2014-05-21 LAB — LIPID PANEL
Cholesterol: 168 mg/dL (ref 0–200)
HDL: 26.8 mg/dL — ABNORMAL LOW (ref >39.00)
LDL CALC: 109 mg/dL — AB (ref 0–99)
NonHDL: 141.2
Total CHOL/HDL Ratio: 6
Triglycerides: 162 mg/dL — ABNORMAL HIGH (ref 0.0–149.0)
VLDL: 32.4 mg/dL (ref 0.0–40.0)

## 2014-05-21 LAB — HEPATIC FUNCTION PANEL
ALT: 15 U/L (ref 0–53)
AST: 22 U/L (ref 0–37)
Albumin: 3.3 g/dL — ABNORMAL LOW (ref 3.5–5.2)
Alkaline Phosphatase: 66 U/L (ref 39–117)
BILIRUBIN DIRECT: 0.1 mg/dL (ref 0.0–0.3)
BILIRUBIN TOTAL: 0.4 mg/dL (ref 0.2–1.2)
TOTAL PROTEIN: 7 g/dL (ref 6.0–8.3)

## 2014-05-21 LAB — URINALYSIS, ROUTINE W REFLEX MICROSCOPIC
Bilirubin Urine: NEGATIVE
Ketones, ur: NEGATIVE
Leukocytes, UA: NEGATIVE
NITRITE: NEGATIVE
PH: 6 (ref 5.0–8.0)
Specific Gravity, Urine: 1.015 (ref 1.000–1.030)
TOTAL PROTEIN, URINE-UPE24: 100 — AB
URINE GLUCOSE: 100 — AB
Urobilinogen, UA: 0.2 (ref 0.0–1.0)

## 2014-05-21 LAB — TESTOSTERONE: TESTOSTERONE: 560.1 ng/dL (ref 300.00–890.00)

## 2014-05-21 LAB — CBC WITH DIFFERENTIAL/PLATELET
BASOS PCT: 0.8 % (ref 0.0–3.0)
Basophils Absolute: 0 10*3/uL (ref 0.0–0.1)
EOS PCT: 5.6 % — AB (ref 0.0–5.0)
Eosinophils Absolute: 0.3 10*3/uL (ref 0.0–0.7)
HEMATOCRIT: 33.7 % — AB (ref 39.0–52.0)
HEMOGLOBIN: 11.3 g/dL — AB (ref 13.0–17.0)
LYMPHS ABS: 1.5 10*3/uL (ref 0.7–4.0)
Lymphocytes Relative: 29.4 % (ref 12.0–46.0)
MCHC: 33.6 g/dL (ref 30.0–36.0)
MCV: 80.1 fl (ref 78.0–100.0)
Monocytes Absolute: 0.6 10*3/uL (ref 0.1–1.0)
Monocytes Relative: 12.3 % — ABNORMAL HIGH (ref 3.0–12.0)
Neutro Abs: 2.7 10*3/uL (ref 1.4–7.7)
Neutrophils Relative %: 51.9 % (ref 43.0–77.0)
Platelets: 201 10*3/uL (ref 150.0–400.0)
RBC: 4.21 Mil/uL — AB (ref 4.22–5.81)
RDW: 14.5 % (ref 11.5–15.5)
WBC: 5.2 10*3/uL (ref 4.0–10.5)

## 2014-05-21 LAB — BASIC METABOLIC PANEL
BUN: 14 mg/dL (ref 6–23)
CALCIUM: 8.5 mg/dL (ref 8.4–10.5)
CHLORIDE: 107 meq/L (ref 96–112)
CO2: 26 meq/L (ref 19–32)
Creatinine, Ser: 1.1 mg/dL (ref 0.40–1.50)
GFR: 88.38 mL/min (ref >60.00)
GLUCOSE: 169 mg/dL — AB (ref 70–99)
POTASSIUM: 4.4 meq/L (ref 3.5–5.1)
SODIUM: 139 meq/L (ref 135–145)

## 2014-05-21 LAB — PSA: PSA: 8.63 ng/mL — AB (ref 0.10–4.00)

## 2014-05-21 LAB — URIC ACID: Uric Acid, Serum: 8.5 mg/dL — ABNORMAL HIGH (ref 4.0–7.8)

## 2014-05-21 LAB — TSH: TSH: 0.99 u[IU]/mL (ref 0.35–4.50)

## 2014-05-21 LAB — MICROALBUMIN / CREATININE URINE RATIO
Creatinine,U: 88.6 mg/dL
Microalb Creat Ratio: 96.5 mg/g — ABNORMAL HIGH (ref 0.0–30.0)
Microalb, Ur: 85.5 mg/dL — ABNORMAL HIGH (ref 0.0–1.9)

## 2014-05-21 LAB — IBC PANEL
Iron: 46 ug/dL (ref 42–165)
Saturation Ratios: 19.1 % — ABNORMAL LOW (ref 20.0–50.0)
Transferrin: 172 mg/dL — ABNORMAL LOW (ref 212.0–360.0)

## 2014-05-21 LAB — HEMOGLOBIN A1C: HEMOGLOBIN A1C: 6.8 % — AB (ref 4.6–6.5)

## 2014-05-21 MED ORDER — AZITHROMYCIN 500 MG PO TABS: 500.0000 mg | ORAL_TABLET | Freq: Every day | ORAL | Status: DC

## 2014-05-21 MED ORDER — BENZONATATE 100 MG PO CAPS: 100.0000 mg | ORAL_CAPSULE | Freq: Three times a day (TID) | ORAL | Status: DC | PRN

## 2014-05-21 NOTE — Patient Instructions (Addendum)
blood tests are being requested for you today.  We'll let you know about the results. Please come back for a regular physical appointment in 3 months check your blood sugar twice a day.  vary the time of day when you check, between before the 3 meals, and at bedtime.  also check if you have symptoms of your blood sugar being too high or too low.  please keep a record of the readings and bring it to your next appointment here.  You can write it on any piece of paper.  please call us sooner if your blood sugar goes below 70, or if you have a lot of readings over 200.  On this type of insulin schedule, you should eat meals on a regular schedule.  If a meal is missed or significantly delayed, your blood sugar could go low.  You may also need to eat a light snack at 9-10 am.  i have sent 2 prescriptions to your pharmacy: for an antibiotic pill, and cough pill.

## 2014-05-21 NOTE — Progress Notes (Signed)
Subjective:    Patient ID: Aaron Fox, male    DOB: 11/01/1955, 59 y.o.   MRN: [id number]  HPI Pt returns for f/u of diabetes mellitus: DM type: 1 Dx'ed: 6270 Complications: sensory neuropathy, retinopathy and nephropathy Therapy: insulin since 2003 DKA: never Severe hypoglycemia: never Pancreatitis: never Other: he can only afford human insulin; he has done better with a simple qd insulin regimen. Interval history:  no cbg record, but states cbg's are well-controlled.  Pt states 1 week of moderate pain at the throat, and assoc nasal congestion.  He has a prod cough.  Past Medical History  Diagnosis Date  . DIABETES MELLITUS, TYPE II 09/22/2006  . HYPOGONADISM, MALE 01/15/2007  . HYPERCHOLESTEROLEMIA 07/02/2008  . GOUT 09/22/2006  . HYPERTENSION 05/31/2009  . CHF 06/19/2008  . ALLERGIC RHINITIS 07/02/2008  . 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 Rheumatoid arthritis(714.0) 09/22/2006  . COUGH DUE TO ACE INHIBITORS 10/05/2008  . Glaucoma   . Atrial fibrillation   . ED (erectile dysfunction)   . PUD (peptic ulcer disease)     Past Surgical History  Procedure Laterality Date  . Spine surgery      Lumbar Spine Surgery  . Ankle fusion      left  . Lower arterial  02/22/2005  . Ct sinus ltd w/o cm  04/06/1997  . Electrocardiogram  09/29/2005  . Lymph node biopsy      History   Social History  . Marital Status: Married    Spouse Name: N/A  . Number of Children: N/A  . Years of Education: N/A   Occupational History  . 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 Smoking status: Former Smoker    Quit date: 02/28/2000  . Smokeless tobacco: Never Used  . Alcohol Use: No  . Drug Use: No  . Sexual Activity: Not on file   Other Topics Concern  . Not on file   Social History Narrative   Regular exercise-yes    Current Outpatient Prescriptions on File Prior to Visit  Medication Sig Dispense Refill  . aspirin 81 MG tablet Take 81 mg  by mouth daily.      . clomiPHENE (CLOMID) 50 MG tablet Take 0.5 tablets (25 mg total) by mouth daily. 15 tablet 5  . gabapentin (NEURONTIN) 300 MG capsule Take 300 mg by mouth at bedtime.    Marland Kitchen glucose blood (ONE TOUCH ULTRA TEST) test strip 1 each by Other route 2 (two) times daily. And lancets 2/day 250.03 100 each 12  . insulin NPH Human (HUMULIN N,NOVOLIN N) 100 UNIT/ML injection Inject 70 Units into the skin every morning.     . Insulin Syringe-Needle U-100 (B-D INSULIN SYRINGE 2CC/27.5G) 27.5G X 5/8" 2 ML MISC Use as directed once daily     . traMADol (ULTRAM) 50 MG tablet Take 1 tablet (50 mg total) by mouth every 6 (six) hours as needed. 15 tablet 0   No current facility-administered medications on file prior to visit.    Allergies  Allergen Reactions  . Penicillins Swelling    Swelling of the hands   . Terbinafine Hcl Hives  . Zestril [Lisinopril] Cough    Family History  Problem Relation Age of Onset  . Cancer Neg Hx   . Colon cancer Neg Hx   . Heart disease Other     FH of CAD    BP 134/80 mmHg  Pulse 78  Temp(Src) 98.4 F (36.9  C) (Oral)  Ht 5' 11.5" (1.816 m)  Wt 229 lb (103.874 kg)  BMI 31.50 kg/m2  SpO2 98%     Review of Systems Denies fever and earache.    Objective:   Physical Exam VITAL SIGNS:  See vs page GENERAL: no distress head: no deformity eyes: no periorbital swelling, no proptosis external nose and ears are normal mouth: no lesion seen        Assessment & Plan:  URI: new DM: apparently well-controlled  Patient is advised the following: Patient Instructions  blood tests are being requested for you today.  We'll let you know about the results. Please come back for a regular physical appointment in 3 months check your blood sugar twice a day.  vary the time of day when you check, between before the 3 meals, and at bedtime.  also check if you have symptoms of your blood sugar being too high or too low.  please keep a record of the  readings and bring it to your next appointment here.  You can write it on any piece of paper.  please call us sooner if your blood sugar goes below 70, or if you have a lot of readings over 200.  On this type of insulin schedule, you should eat meals on a regular schedule.  If a meal is missed or significantly delayed, your blood sugar could go low.  You may also need to eat a light snack at 9-10 am.  i have sent 2 prescriptions to your pharmacy: for an antibiotic pill, and cough pill.

## 2014-05-27 ENCOUNTER — Other Ambulatory Visit: Payer: Self-pay | Admitting: Endocrinology

## 2014-05-27 DIAGNOSIS — R972 Elevated prostate specific antigen [PSA]: Secondary | ICD-10-CM | POA: Insufficient documentation

## 2014-05-27 DIAGNOSIS — D509 Iron deficiency anemia, unspecified: Secondary | ICD-10-CM | POA: Insufficient documentation

## 2014-05-29 ENCOUNTER — Other Ambulatory Visit: Payer: Self-pay

## 2014-05-29 MED ORDER — CLOMIPHENE CITRATE 50 MG PO TABS: 25.0000 mg | ORAL_TABLET | Freq: Every day | ORAL | Status: DC

## 2014-06-01 ENCOUNTER — Encounter: Payer: Self-pay | Admitting: Physician Assistant

## 2014-06-05 ENCOUNTER — Encounter: Payer: Self-pay | Admitting: Gastroenterology

## 2014-06-12 ENCOUNTER — Encounter: Payer: Self-pay | Admitting: Physician Assistant

## 2014-06-12 ENCOUNTER — Ambulatory Visit (INDEPENDENT_AMBULATORY_CARE_PROVIDER_SITE_OTHER): Payer: 59 | Admitting: Physician Assistant

## 2014-06-12 ENCOUNTER — Other Ambulatory Visit (INDEPENDENT_AMBULATORY_CARE_PROVIDER_SITE_OTHER): Payer: 59

## 2014-06-12 VITALS — BP 142/78 | HR 84 | Ht 71.5 in | Wt 231.5 lb

## 2014-06-12 DIAGNOSIS — D649 Anemia, unspecified: Secondary | ICD-10-CM

## 2014-06-12 DIAGNOSIS — R195 Other fecal abnormalities: Secondary | ICD-10-CM | POA: Diagnosis not present

## 2014-06-12 DIAGNOSIS — D509 Iron deficiency anemia, unspecified: Secondary | ICD-10-CM

## 2014-06-12 LAB — IGA: IGA: 585 mg/dL — AB (ref 68–378)

## 2014-06-12 NOTE — Patient Instructions (Signed)
You have been scheduled for an endoscopy and colonoscopy. Please follow the written instructions given to you at your visit today. Please pick up your prep supplies at the pharmacy within the next 1-3 days. If you use inhalers (even only as needed), please bring them with you on the day of your procedure. Your physician has requested that you go to www.startemmi.com and enter the access code given to you at your visit today. This web site gives a general overview about your procedure. However, you should still follow specific instructions given to you by our office regarding your preparation for the procedure.  Your physician has requested that you go to the basement for lab work before leaving today. 

## 2014-06-12 NOTE — Progress Notes (Signed)
Patient ID: Aaron Fox, male   DOB: [birth year], 59 y.o.   MRN: [id number]    HPI:  Aaron Fox is a 59 y.o.   male referred by Aaron Shin, MD due to recent findings of anemia.  Aaron Fox is known to Aaron Fox and had a colonoscopy in 2004 part of an evaluation of anemia with low iron saturation. Colonoscopy revealed a normal exam from the cecum to the sigmoid and no cause of the anemia was found. Patient states he has been feeling fine since then but was recently evaluated for routine physical and found to again have an iron deficiency anemia. He denies any change in his bowel habits or stool caliber. He states he has a formed bowel movement on a daily basis but his stools tend to be firm. He states he will have blood streaking on the toilet tissue perhaps once weekly for the past several months. He has no rectal pain, itching, burning or sensation of incomplete evacuation. He denies complaints of epigastric pain, nausea, vomiting, or early satiety. His appetite as been good and his weight has been stable he is not aware of a family history of colon cancer but says he has a paternal uncle who has had colon polyps. He is not aware of a family history of inflammatory bowel disease.   Past Medical History  Diagnosis Date  . DIABETES MELLITUS, TYPE II 09/22/2006  . HYPOGONADISM, MALE 01/15/2007  . HYPERCHOLESTEROLEMIA 07/02/2008  . GOUT 09/22/2006  . HYPERTENSION 05/31/2009  . CHF 06/19/2008  . ALLERGIC RHINITIS 07/02/2008  . 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 Rheumatoid arthritis(714.0) 09/22/2006  . COUGH DUE TO ACE INHIBITORS 10/05/2008  . Glaucoma   . Atrial fibrillation   . ED (erectile dysfunction)   . PUD (peptic ulcer disease)   . Hypokalemia   . Lymphadenopathy     Past Surgical History  Procedure Laterality Date  . Spine surgery      Lumbar Spine Surgery  . Ankle fusion      left  . Lower arterial  02/22/2005  . Ct sinus ltd w/o cm  04/06/1997  .  Electrocardiogram  09/29/2005  . Lymph node biopsy     Family History  Problem Relation Age of Onset  . Cancer Neg Hx   . Colon cancer Neg Hx   . Heart disease Other     FH of CAD  . Colon polyps Paternal Uncle   . Kidney disease Paternal Uncle   . Gallbladder disease Neg Hx   . Esophageal cancer Neg Hx   . Diabetes Paternal Grandfather   . Diabetes Maternal Uncle    History  Substance Use Topics  . Smoking status: Former Smoker    Quit date: 11/02/1999  . Smokeless tobacco: Never Used  . Alcohol Use: No   Current Outpatient Prescriptions  Medication Sig Dispense Refill  . aspirin 81 MG tablet Take 81 mg by mouth daily.      . clomiPHENE (CLOMID) 50 MG tablet Take 0.5 tablets (25 mg total) by mouth daily. 15 tablet 5  . gabapentin (NEURONTIN) 300 MG capsule Take 300 mg by mouth at bedtime.    Marland Kitchen glucose blood (ONE TOUCH ULTRA TEST) test strip 1 each by Other route 2 (two) times daily. And lancets 2/day 250.03 100 each 12  . insulin NPH Human (HUMULIN N,NOVOLIN N) 100 UNIT/ML injection Inject 70 Units into the skin every morning.     . Insulin Syringe-Needle U-100 (B-D INSULIN  SYRINGE 2CC/27.5G) 27.5G X 5/8" 2 ML MISC Use as directed once daily      No current facility-administered medications for this visit.   Allergies  Allergen Reactions  . Penicillins Swelling    Swelling of the hands   . Terbinafine Hcl Hives  . Zestril [Lisinopril] Cough     Review of Systems: Gen: Denies any fever, chills, sweats, anorexia, fatigue, weakness, malaise, weight loss, and sleep disorder CV: Denies chest pain, angina, palpitations, syncope, orthopnea, PND, peripheral edema, and claudication. Resp: Denies dyspnea at rest, dyspnea with exercise, cough, sputum, wheezing, coughing up blood, and pleurisy. GI: Denies vomiting blood, jaundice, and fecal incontinence.   Denies dysphagia or odynophagia. GU : Denies urinary burning, blood in urine, urinary frequency, urinary hesitancy, nocturnal  urination, and urinary incontinence. MS: Denies joint pain, limitation of movement, and swelling, stiffness, low back pain, extremity pain. Denies muscle weakness, cramps, atrophy.  Derm: Denies rash, itching, dry skin, hives, moles, warts, or unhealing ulcers.  Psych: Denies depression, anxiety, memory loss, suicidal ideation, hallucinations, paranoia, and confusion. Heme: Denies bruising, bleeding, and enlarged lymph nodes. Neuro:  Denies any headaches, dizziness, paresthesias. Endo:  Denies any problems with thyroid, adrenal function    LAB RESULTS: Blood work from 05/21/2014 revealed a CBC with a white blood cell count 5.2, hemoglobin 11.3, hematocrit 33.7, platelets 201,000, MCV 80.1. Iron 46, transferrin 172, saturation 19. Prior Endoscopies:   Colonoscopy 02/19/2003 was a normal exam cecum to sigmoid. Internal and external hemorrhoids grade 1 nonbleeding were noted. No cause of anemia was found.  Physical Exam: BP 142/78 mmHg  Pulse 84  Ht 5' 11.5" (1.816 m)  Wt 231 lb 8 oz (105.008 kg)  BMI 31.84 kg/m2 Constitutional: Pleasant,well-developed AAmale in no acute distress. HEENT: Normocephalic and atraumatic. Conjunctivae are normal. No scleral icterus. Neck supple.  Cardiovascular: Normal rate, regular rhythm.  Pulmonary/chest: Effort normal and breath sounds normal. No wheezing, rales or rhonchi. Abdominal: Soft, nondistended, nontender. Bowel sounds active throughout. There are no masses palpable. No hepatomegaly. Rectal: brown stool, heme positive Extremities: trace edema BLE Lymphadenopathy: No cervical adenopathy noted. Neurological: Alert and oriented to person place and time. Skin: Skin is warm and dry. No rashes noted. Psychiatric: Normal mood and affect. Behavior is normal.  ASSESSMENT AND PLAN: 59 year old African American male found to have an iron deficiency anemia referred for evaluation. Stools today are heme positive. Patient has been advised to undergo  colonoscopy to evaluate for polyps, neoplasia, AVMs or other intraluminal etiology for his heme positive stools and iron deficiency anemia. IgA and TTG will be obtained. She will also be scheduled for an EGD to evaluate for an upper GI source of blood loss and heme-positive stools.The risks, benefits, and alternatives to colonoscopy with possible biopsy and possible polypectomy were discussed with the patient and they consent to proceed. The risks, benefits, and alternatives to endoscopy with possible biopsy and possible dilation were discussed with the patient and they consent to proceed. The procedures will be scheduled with Aaron Fox. Further recommendations will be made pending the findings of the above.    Caisen Mangas, Deloris Ping 06/12/2014, 10:55 AM  CC: Aaron Shin, MD

## 2014-06-15 LAB — TISSUE TRANSGLUTAMINASE, IGA: Tissue Transglutaminase Ab, IgA: 1 U/mL (ref <4)

## 2014-06-17 NOTE — Progress Notes (Signed)
Agree w/ Ms. Hvozdovic's note and mangement. He may have chronic disease anemia but needs evaluation due to heme + stool.  Gatha Mayer, MD, Marval Regal

## 2014-07-02 ENCOUNTER — Encounter: Payer: Self-pay | Admitting: Internal Medicine

## 2014-07-16 ENCOUNTER — Telehealth: Payer: Self-pay | Admitting: Endocrinology

## 2014-07-16 NOTE — Telephone Encounter (Signed)
Rec'd from [facility] forward 5 pages to Dr. Florina Ou

## 2014-07-20 ENCOUNTER — Encounter: Payer: Self-pay | Admitting: Endocrinology

## 2014-07-20 ENCOUNTER — Ambulatory Visit (INDEPENDENT_AMBULATORY_CARE_PROVIDER_SITE_OTHER): Payer: 59 | Admitting: Endocrinology

## 2014-07-20 VITALS — BP 138/86 | HR 79 | Temp 98.6°F | Ht 71.5 in | Wt 233.0 lb

## 2014-07-20 DIAGNOSIS — E1049 Type 1 diabetes mellitus with other diabetic neurological complication: Secondary | ICD-10-CM

## 2014-07-20 DIAGNOSIS — E1065 Type 1 diabetes mellitus with hyperglycemia: Principal | ICD-10-CM

## 2014-07-20 DIAGNOSIS — E1041 Type 1 diabetes mellitus with diabetic mononeuropathy: Secondary | ICD-10-CM | POA: Diagnosis not present

## 2014-07-20 DIAGNOSIS — IMO0002 Reserved for concepts with insufficient information to code with codable children: Secondary | ICD-10-CM

## 2014-07-20 LAB — HEMOGLOBIN A1C: Hgb A1c MFr Bld: 6.7 % — ABNORMAL HIGH (ref 4.6–6.5)

## 2014-07-20 MED ORDER — ATORVASTATIN CALCIUM 10 MG PO TABS: 10.0000 mg | ORAL_TABLET | Freq: Every day | ORAL | Status: DC

## 2014-07-20 MED ORDER — ALLOPURINOL 100 MG PO TABS: 100.0000 mg | ORAL_TABLET | Freq: Every day | ORAL | Status: DC

## 2014-07-20 NOTE — Patient Instructions (Addendum)
blood tests are being requested for you today.  We'll let you know about the results. Please come back for a regular physical appointment in 3 months.   Please reduce the insulin to 65 units each morning.   i have sent prescriptions to your pharmacy, for the gout and cholesterol.  Due to the high PSA, you should stop taking the clomiphene pill.   check your blood sugar twice a day.  vary the time of day when you check, between before the 3 meals, and at bedtime.  also check if you have symptoms of your blood sugar being too high or too low.  please keep a record of the readings and bring it to your next appointment here.  You can write it on any piece of paper.  please call us sooner if your blood sugar goes below 70, or if you have a lot of readings over 200.  On this type of insulin schedule, you should eat meals on a regular schedule.  If a meal is missed or significantly delayed, your blood sugar could go low.

## 2014-07-20 NOTE — Progress Notes (Signed)
Subjective:    Patient ID: Aaron Fox, male    DOB: Feb 25, 1956, 59 y.o.   MRN: [id number]  HPI Pt returns for f/u of diabetes mellitus: DM type: 1 Dx'ed: 1884 Complications: sensory neuropathy, retinopathy and nephropathy Therapy: insulin since 2003 DKA: never Severe hypoglycemia: never Pancreatitis: never Other: he can only afford human insulin; he has done better with a simple qd insulin regimen. Interval history:  no cbg record, but states cbg's are well-controlled, except it is often mildly low at lunch.  pt states he feels well in general. Past Medical History  Diagnosis Date  . DIABETES MELLITUS, TYPE II 09/22/2006  . HYPOGONADISM, MALE 01/15/2007  . HYPERCHOLESTEROLEMIA 07/02/2008  . GOUT 09/22/2006  . HYPERTENSION 05/31/2009  . CHF 06/19/2008  . ALLERGIC RHINITIS 07/02/2008  . 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 Rheumatoid arthritis(714.0) 09/22/2006  . COUGH DUE TO ACE INHIBITORS 10/05/2008  . Glaucoma   . Atrial fibrillation   . ED (erectile dysfunction)   . PUD (peptic ulcer disease)   . Hypokalemia   . Lymphadenopathy     Past Surgical History  Procedure Laterality Date  . Spine surgery      Lumbar Spine Surgery  . Ankle fusion      left  . Lower arterial  02/22/2005  . Ct sinus ltd w/o cm  04/06/1997  . Electrocardiogram  09/29/2005  . Lymph node biopsy      History   Social History  . Marital Status: Married    Spouse Name: N/A  . Number of Children: 3  . Years of Education: N/A   Occupational History  . Diability    Social History Main Topics  . Smoking status: Former Smoker    Quit date: 11/02/1999  . Smokeless tobacco: Never Used  . Alcohol Use: No  . Drug Use: No  . Sexual Activity: Not on file   Other Topics Concern  . Not on file   Social History Narrative   Regular exercise-yes    Current Outpatient Prescriptions on File Prior to Visit  Medication Sig Dispense Refill  . aspirin 81 MG tablet Take 81 mg by mouth daily.       Marland Kitchen gabapentin (NEURONTIN) 300 MG capsule Take 300 mg by mouth at bedtime.    Marland Kitchen glucose blood (ONE TOUCH ULTRA TEST) test strip 1 each by Other route 2 (two) times daily. And lancets 2/day 250.03 100 each 12  . insulin NPH Human (HUMULIN N,NOVOLIN N) 100 UNIT/ML injection Inject 65 Units into the skin every morning.     . Insulin Syringe-Needle U-100 (B-D INSULIN SYRINGE 2CC/27.5G) 27.5G X 5/8" 2 ML MISC Use as directed once daily      No current facility-administered medications on file prior to visit.    Allergies  Allergen Reactions  . Penicillins Swelling    Swelling of the hands   . Terbinafine Hcl Hives  . Zestril [Lisinopril] Cough    Family History  Problem Relation Age of Onset  . Cancer Neg Hx   . Colon cancer Neg Hx   . Heart disease Other     FH of CAD  . Colon polyps Paternal Uncle   . Kidney disease Paternal Uncle   . Gallbladder disease Neg Hx   . Esophageal cancer Neg Hx   . Diabetes Paternal Grandfather   . Diabetes Maternal Uncle     BP 138/86 mmHg  Pulse 79  Temp(Src) 98.6 F (37 C) (Oral)  Ht 5' 11.5" (  1.816 m)  Wt 233 lb (105.688 kg)  BMI 32.05 kg/m2  SpO2 97%    Review of Systems Denies LOC.  No recent gout sxs.    Objective:   Physical Exam VITAL SIGNS:  See vs page GENERAL: no distress Pulses: dorsalis pedis intact bilat.   MSK: no deformity of the feet  CV: 1+ bilat leg edema  Skin: no ulcer on the feet. normal color and temp on the feet.  Neuro: sensation is intact to touch on the feet, but severely decreased from normal  Ext: There is bilateral onychomycosis of the toenails.    Lab Results  Component Value Date   HGBA1C 6.7* 07/20/2014      Assessment & Plan:  Dyslipidemia: after discussion, he agrees to rx today Gout: after discussion, he agrees to rx today DM: overcontrolled, given this regimen, which does match insulin to her changing needs throughout the day.  Patient is advised the following: Patient Instructions   blood tests are being requested for you today.  We'll let you know about the results. Please come back for a regular physical appointment in 3 months.   Please reduce the insulin to 65 units each morning.   i have sent prescriptions to your pharmacy, for the gout and cholesterol.  Due to the high PSA, you should stop taking the clomiphene pill.   check your blood sugar twice a day.  vary the time of day when you check, between before the 3 meals, and at bedtime.  also check if you have symptoms of your blood sugar being too high or too low.  please keep a record of the readings and bring it to your next appointment here.  You can write it on any piece of paper.  please call us sooner if your blood sugar goes below 70, or if you have a lot of readings over 200.  On this type of insulin schedule, you should eat meals on a regular schedule.  If a meal is missed or significantly delayed, your blood sugar could go low.

## 2014-07-22 ENCOUNTER — Telehealth: Payer: Self-pay | Admitting: Internal Medicine

## 2014-07-22 NOTE — Telephone Encounter (Signed)
No charge. 

## 2014-07-22 NOTE — Telephone Encounter (Signed)
Dr. Carlean Purl,  Patient called to cancel his EGD/COLON that was scheduled for 5/26.  Pt states he is sick and has a fever.  Advise on cancellation fee.

## 2014-07-23 ENCOUNTER — Encounter: Payer: 59 | Admitting: Internal Medicine

## 2014-09-17 ENCOUNTER — Encounter (HOSPITAL_COMMUNITY): Payer: Self-pay | Admitting: Hematology & Oncology

## 2014-09-17 ENCOUNTER — Encounter (HOSPITAL_COMMUNITY): Payer: 59 | Attending: Hematology & Oncology | Admitting: Hematology & Oncology

## 2014-09-17 VITALS — BP 169/77 | HR 93 | Temp 98.5°F | Resp 16 | Ht 70.75 in | Wt 226.8 lb

## 2014-09-17 DIAGNOSIS — R590 Localized enlarged lymph nodes: Secondary | ICD-10-CM | POA: Diagnosis present

## 2014-09-17 LAB — COMPREHENSIVE METABOLIC PANEL
ALBUMIN: 3.5 g/dL (ref 3.5–5.0)
ALT: 18 U/L (ref 17–63)
ANION GAP: 7 (ref 5–15)
AST: 23 U/L (ref 15–41)
Alkaline Phosphatase: 101 U/L (ref 38–126)
BUN: 23 mg/dL — AB (ref 6–20)
CO2: 23 mmol/L (ref 22–32)
CREATININE: 1.3 mg/dL — AB (ref 0.61–1.24)
Calcium: 9.1 mg/dL (ref 8.9–10.3)
Chloride: 102 mmol/L (ref 101–111)
GFR calc Af Amer: >60 mL/min (ref >60)
GFR calc non Af Amer: 59 mL/min — ABNORMAL LOW (ref >60)
GLUCOSE: 357 mg/dL — AB (ref 65–99)
POTASSIUM: 4 mmol/L (ref 3.5–5.1)
Sodium: 132 mmol/L — ABNORMAL LOW (ref 135–145)
Total Bilirubin: 0.5 mg/dL (ref 0.3–1.2)
Total Protein: 7.7 g/dL (ref 6.5–8.1)

## 2014-09-17 LAB — CBC WITH DIFFERENTIAL/PLATELET
BASOS ABS: 0.1 10*3/uL (ref 0.0–0.1)
BASOS PCT: 1 % (ref 0–1)
Eosinophils Absolute: 0.4 10*3/uL (ref 0.0–0.7)
Eosinophils Relative: 8 % — ABNORMAL HIGH (ref 0–5)
HEMATOCRIT: 34.8 % — AB (ref 39.0–52.0)
Hemoglobin: 12.3 g/dL — ABNORMAL LOW (ref 13.0–17.0)
Lymphocytes Relative: 47 % — ABNORMAL HIGH (ref 12–46)
Lymphs Abs: 2.2 10*3/uL (ref 0.7–4.0)
MCH: 28.1 pg (ref 26.0–34.0)
MCHC: 35.3 g/dL (ref 30.0–36.0)
MCV: 79.5 fL (ref 78.0–100.0)
Monocytes Absolute: 0.5 10*3/uL (ref 0.1–1.0)
Monocytes Relative: 11 % (ref 3–12)
Neutro Abs: 1.5 10*3/uL — ABNORMAL LOW (ref 1.7–7.7)
Neutrophils Relative %: 33 % — ABNORMAL LOW (ref 43–77)
Platelets: 178 10*3/uL (ref 150–400)
RBC: 4.38 MIL/uL (ref 4.22–5.81)
RDW: 13.3 % (ref 11.5–15.5)
WBC: 4.6 10*3/uL (ref 4.0–10.5)

## 2014-09-17 LAB — LACTATE DEHYDROGENASE: LDH: 175 U/L (ref 98–192)

## 2014-09-17 LAB — SEDIMENTATION RATE: Sed Rate: 78 mm/hr — ABNORMAL HIGH (ref 0–16)

## 2014-09-17 NOTE — Patient Instructions (Signed)
[place] at [facility] Discharge Instructions  RECOMMENDATIONS MADE BY THE CONSULTANT AND ANY TEST RESULTS WILL BE SENT TO YOUR REFERRING PHYSICIAN.  Exam and discussion by Dr. Whitney Muse. Need to get your records from your visit with Dr. Earlie Server in 2010. Will check some labwork today. PET scan - will be done at [facility] - nothing to eat or drink 6 hours prior to the scan. Do not eat or drink anything with sugar in it.  No chewing gum, hard candy, mints, etc. We will see you in follow-up after the PET Scan.  Thank you for choosing [place] at [facility] to provide your oncology and hematology care.  To afford each patient quality time with our provider, please arrive at least 15 minutes before your scheduled appointment time.    You need to re-schedule your appointment should you arrive 10 or more minutes late.  We strive to give you quality time with our providers, and arriving late affects you and other patients whose appointments are after yours.  Also, if you no show three or more times for appointments you may be dismissed from the clinic at the providers discretion.     Again, thank you for choosing [facility].  Our hope is that these requests will decrease the amount of time that you wait before being seen by our physicians.       _____________________________________________________________  Should you have questions after your visit to [facility], please contact our office at [phone] between the hours of 8:30 a.m. and 4:30 p.m.  Voicemails left after 4:30 p.m. will not be returned until the following business day.  For prescription refill requests, have your pharmacy contact our office.

## 2014-09-17 NOTE — Progress Notes (Signed)
[name] at [facility] NOTE  Patient Care Team: Renato Shin, MD as PCP - General  CHIEF COMPLAINTS/PURPOSE OF CONSULTATION:  Lymphadenopathy seen on CT (hematuria protocol) 08/24/2014 Patient seen by Dr. Julien Nordmann in 07/2008 Biopsy of left inguinal lymph node in October 2010 with florid lymphoid hyperplasia Flow cytometry shows a dominance of T lymphocytes with nonspecific changes B cells were a minor population without monoclonality  HISTORY OF PRESENTING ILLNESS:  Aaron Fox 59 y.o. male is here because of abnormal CT imaging.   The patient presents alone today. His hobby includes restoring cars.  He sees Dr. Renato Shin, [facility] for his anemia and low blood counts. He notes that he has had blood in his urine and has been seeing urology.The patient reports has had a cystoscopy scope, negative results. He has a history of a L inguinal lymph node biopsy, Dr. Grandville Silos. He knows he has been told he had enlarged lymph nodes in the past and believes he saw an oncologist many years back. He had a biopsy of a lymph node is previously noted.  He was diagnosed with diabetes in 2002; he uses Insulin.  Due to his diabetes along with neuropathy, he no longer works.    The patient has had no change in his appetite nor experienced any weight loss.  He admits to having drenching night sweats but this has not occurred in 6 moths. He has been experiencing daily lower back pain that began 1 year ago.  He describes that as a lot of soreness.  It does not wake him up at night. His fatigue is consistent.  The patient has no other concerns at this time.  MEDICAL HISTORY:  Past Medical History  Diagnosis Date  . DIABETES MELLITUS, TYPE II 09/22/2006  . HYPOGONADISM, MALE 01/15/2007  . HYPERCHOLESTEROLEMIA 07/02/2008  . GOUT 09/22/2006  . HYPERTENSION 05/31/2009  . CHF 06/19/2008  . ALLERGIC RHINITIS 07/02/2008  . 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 Rheumatoid  arthritis(714.0) 09/22/2006  . COUGH DUE TO ACE INHIBITORS 10/05/2008  . Atrial fibrillation   . ED (erectile dysfunction)   . PUD (peptic ulcer disease)   . Hypokalemia   . Lymphadenopathy   . Glaucoma   . Peripheral neuropathy     SURGICAL HISTORY: Past Surgical History  Procedure Laterality Date  . Lymph node biopsy    . Cataract extraction, bilateral    . Colonoscopy  2004    SOCIAL HISTORY: Social History   Social History  . Marital Status: Married    Spouse Name: N/A  . Number of Children: 3  . Years of Education: N/A   Occupational History  . Diability    Social History Main Topics  . Smoking status: Former Smoker    Quit date: 11/02/1999  . Smokeless tobacco: Never Used  . Alcohol Use: No  . Drug Use: No  . Sexual Activity: Not on file   Other Topics Concern  . Not on file   Social History Narrative   Regular exercise-yes  Married, 23 years 3 children 9 grandchildren Former smoker, began at age 55, 1 ppd, quit in 2001 ETOH, none, stopped in 1988 Formerly employed with [employer]  FAMILY HISTORY: Family History  Problem Relation Age of Onset  . Cancer Neg Hx   . Colon cancer Neg Hx   . Heart disease Other     FH of CAD  . Colon polyps Paternal Uncle   .  Kidney disease Paternal Uncle   . Gallbladder disease Neg Hx   . Esophageal cancer Neg Hx   . Diabetes Paternal Grandfather   . Diabetes Maternal Uncle    has no family status information on file.  Father deceased, 3, massive heart attack Mother living, 25 2 brothers  ALLERGIES:  is allergic to penicillins; terbinafine hcl; and zestril.  MEDICATIONS:  Current Outpatient Prescriptions  Medication Sig Dispense Refill  . aspirin 81 MG tablet Take 81 mg by mouth daily.      Marland Kitchen gabapentin (NEURONTIN) 300 MG capsule Take 300 mg by mouth at bedtime.    Marland Kitchen glucose blood (ONE TOUCH ULTRA TEST) test strip 1 each by Other route 2 (two) times daily. And lancets  2/day 250.03 100 each 12  . insulin NPH Human (HUMULIN N,NOVOLIN N) 100 UNIT/ML injection Inject 65 Units into the skin every morning.     . Insulin Syringe-Needle U-100 (B-D INSULIN SYRINGE 2CC/27.5G) 27.5G X 5/8" 2 ML MISC Use as directed once daily     . allopurinol (ZYLOPRIM) 100 MG tablet Take 1 tablet (100 mg total) by mouth daily. (Patient not taking: Reported on 09/17/2014) 30 tablet 6  . atorvastatin (LIPITOR) 10 MG tablet Take 1 tablet (10 mg total) by mouth daily. (Patient not taking: Reported on 09/17/2014) 90 tablet 3   No current facility-administered medications for this visit.    Review of Systems  Constitutional: Positive for malaise/fatigue.  HENT: Negative.   Eyes: Negative.   Respiratory: Negative.   Gastrointestinal: Negative.   Genitourinary: Positive for frequency and hematuria.  Musculoskeletal: Positive for back pain.       Lower back soreness  Neurological: Positive for tingling and sensory change.  Endo/Heme/Allergies: Negative.   Psychiatric/Behavioral: Negative.    14 point ROS was done and is otherwise as detailed above or in HPI   PHYSICAL EXAMINATION: ECOG PERFORMANCE STATUS: 0 - Asymptomatic  Filed Vitals:   09/17/14 1522  BP: 169/77  Pulse: 93  Temp: 98.5 F (36.9 C)  Resp: 16   Filed Weights   09/17/14 1522  Weight: 226 lb 12.8 oz (102.876 kg)     Physical Exam  Constitutional: He is oriented to person, place, and time and well-developed, well-nourished, and in no distress.  Dentures on top  HENT:  Head: Normocephalic and atraumatic.  Right Ear: External ear normal.  Left Ear: External ear normal.  Nose: Nose normal.  Mouth/Throat: Oropharynx is clear and moist. No oropharyngeal exudate.  Eyes: Conjunctivae and EOM are normal. Pupils are equal, round, and reactive to light. Right eye exhibits no discharge. Left eye exhibits no discharge. No scleral icterus.  Neck: Normal range of motion. Neck supple. No tracheal deviation present.  No thyromegaly present.  Cardiovascular: Normal rate, regular rhythm and normal heart sounds.  Exam reveals no gallop and no friction rub.   No murmur heard. Pulmonary/Chest: Effort normal and breath sounds normal. He has no wheezes. He has no rales.  Abdominal: Soft. Bowel sounds are normal. He exhibits no distension and no mass. There is no tenderness. There is no rebound and no guarding.  Musculoskeletal: Normal range of motion. He exhibits no edema.  Lymphadenopathy:    He has no cervical adenopathy.  R inguinal lymph node, easily moveable  Neurological: He is alert and oriented to person, place, and time. He has normal reflexes. No cranial nerve deficit. Gait normal. Coordination normal.  Skin: Skin is warm and dry. No rash noted.  Psychiatric: Mood, memory, affect  and judgment normal.  Nursing note and vitals reviewed.   LABORATORY DATA:  I have reviewed the data as listed Lab Results  Component Value Date   WBC 4.6 09/17/2014   HGB 12.3* 09/17/2014   HCT 34.8* 09/17/2014   MCV 79.5 09/17/2014   PLT 178 09/17/2014   CMP     Component Value Date/Time   NA 132* 09/17/2014 1625   K 4.0 09/17/2014 1625   CL 102 09/17/2014 1625   CO2 23 09/17/2014 1625   GLUCOSE 357* 09/17/2014 1625   BUN 23* 09/17/2014 1625   CREATININE 1.30* 09/17/2014 1625   CALCIUM 9.1 09/17/2014 1625   PROT 7.7 09/17/2014 1625   ALBUMIN 3.5 09/17/2014 1625   AST 23 09/17/2014 1625   ALT 18 09/17/2014 1625   ALKPHOS 101 09/17/2014 1625   BILITOT 0.5 09/17/2014 1625   GFRNONAA 59* 09/17/2014 1625   GFRAA >60 09/17/2014 1625    RADIOGRAPHIC STUDIES: I have personally reviewed the radiological images as listed and agreed with the findings in the report. CT (hematuria protocol) 08/19/2014 at [facility] Report only   ASSESSMENT & PLAN:  Lymphadenopathy seen on CT (hematuria protocol) 08/24/2014 Patient seen by Dr. Julien Nordmann in 07/2008 Biopsy of left inguinal lymph node in October  2010 with florid lymphoid hyperplasia Flow cytometry shows a dominance of T lymphocytes with nonspecific changes B cells were a minor population without monoclonality  I have recommended another biopsy, his last in 2010 showed florid follicular hyperplasia in an inguinal node. I reviewed his biopsy from back in 2010 with him and discussed the results. I discussed with the patient that it is still potentially possible he could have a low-grade lymphoma. At times inguinal biopsies are not ideal. Given that this is the most accessible area for biopsy however we will most likely recommend proceeding with inguinal biopsy again. If the results are negative we can then discuss proceeding with biopsy of other lymph node regions.  I have also recommended PET imaging, I discussed the rationale of additional imaging with the patient; primarily to evaluate for other sites of disease. Although I suspect he has a low-grade process findings should still be PET avid. He is agreeable to proceeding. I will see him back post PET to review the results. Based upon the results of his PET imaging we will make referral for an open biopsy.  Orders Placed This Encounter  Procedures  . NM PET Image Initial (PI) Skull Base To Thigh    Standing Status: Future     Number of Occurrences: 1     Standing Expiration Date: 09/17/2015    Order Specific Question:  Reason for Exam (SYMPTOM  OR DIAGNOSIS REQUIRED)    Answer:  pathologic retroperitoneal LAD    Order Specific Question:  Preferred imaging location?    Answer:  [facility]  . CBC with Differential  . Comprehensive metabolic panel  . Lactate dehydrogenase  . Sedimentation rate    All questions were answered. The patient knows to call the clinic with any problems, questions or concerns.   This document serves as a record of services personally performed by Ancil Linsey, MD. It was created on her behalf by Janace Hoard, a trained medical scribe. The  creation of this record is based on the scribe's personal observations and the provider's statements to them. This document has been checked and approved by the attending provider.  I have reviewed the above documentation for accuracy and completeness, and I agree  with the above.  This note was electronically signed.  Kelby Fam. Whitney Muse, MD

## 2014-09-25 ENCOUNTER — Ambulatory Visit (HOSPITAL_COMMUNITY)
Admission: RE | Admit: 2014-09-25 | Discharge: 2014-09-25 | Disposition: A | Discharge status: Home / Self Care | Payer: 59 | Source: Ambulatory Visit | Attending: Hematology & Oncology | Admitting: Hematology & Oncology

## 2014-09-25 ENCOUNTER — Encounter (HOSPITAL_COMMUNITY): Payer: Self-pay

## 2014-09-25 DIAGNOSIS — R911 Solitary pulmonary nodule: Secondary | ICD-10-CM | POA: Insufficient documentation

## 2014-09-25 DIAGNOSIS — J432 Centrilobular emphysema: Secondary | ICD-10-CM | POA: Insufficient documentation

## 2014-09-25 DIAGNOSIS — I700 Atherosclerosis of aorta: Secondary | ICD-10-CM | POA: Insufficient documentation

## 2014-09-25 DIAGNOSIS — R590 Localized enlarged lymph nodes: Secondary | ICD-10-CM | POA: Insufficient documentation

## 2014-09-25 LAB — GLUCOSE, CAPILLARY: GLUCOSE-CAPILLARY: 257 mg/dL — AB (ref 65–99)

## 2014-09-25 MED ORDER — FLUDEOXYGLUCOSE F - 18 (FDG) INJECTION
11.3300 | Freq: Once | INTRAVENOUS | Status: AC | PRN
  Administered 2014-09-25: 11.33 via INTRAVENOUS

## 2014-09-28 ENCOUNTER — Encounter (HOSPITAL_COMMUNITY): Payer: Self-pay | Admitting: Oncology

## 2014-09-28 ENCOUNTER — Ambulatory Visit (HOSPITAL_COMMUNITY): Payer: 59 | Admitting: Hematology & Oncology

## 2014-09-28 ENCOUNTER — Encounter (HOSPITAL_COMMUNITY): Payer: 59 | Attending: Hematology & Oncology | Admitting: Oncology

## 2014-09-28 ENCOUNTER — Ambulatory Visit (HOSPITAL_COMMUNITY): Payer: 59 | Admitting: Oncology

## 2014-09-28 VITALS — BP 143/75 | HR 87 | Temp 98.9°F | Resp 18 | Wt 224.9 lb

## 2014-09-28 DIAGNOSIS — R590 Localized enlarged lymph nodes: Secondary | ICD-10-CM | POA: Insufficient documentation

## 2014-09-28 NOTE — Assessment & Plan Note (Signed)
Abdominal and pelvic adenopathy with a right inguinal node that is enlarged and hypermetabolic.  Previous biopsy has been negative for malignancy and instead demonstrated lymphoid hyperplasia.    Will refer the patient to Dr. Arnoldo Morale for excisional lymph node biopsy.  If biopsy remains negative, will likely have to pursue a biopsy of another area, possibly mediastinum.  Return in 2-3 weeks for follow-up after biopsy.

## 2014-09-28 NOTE — Patient Instructions (Signed)
[place] at [facility] Discharge Instructions  RECOMMENDATIONS MADE BY THE CONSULTANT AND ANY TEST RESULTS WILL BE SENT TO YOUR REFERRING PHYSICIAN.  We have referred you to see Dr. Arnoldo Morale. Return to follow up with Dr. Whitney Muse in 2-3 weeks.    Thank you for choosing [place] at [facility] to provide your oncology and hematology care.  To afford each patient quality time with our provider, please arrive at least 15 minutes before your scheduled appointment time.    You need to re-schedule your appointment should you arrive 10 or more minutes late.  We strive to give you quality time with our providers, and arriving late affects you and other patients whose appointments are after yours.  Also, if you no show three or more times for appointments you may be dismissed from the clinic at the providers discretion.     Again, thank you for choosing [facility].  Our hope is that these requests will decrease the amount of time that you wait before being seen by our physicians.       _____________________________________________________________  Should you have questions after your visit to [facility], please contact our office at [phone] between the hours of 8:30 a.m. and 4:30 p.m.  Voicemails left after 4:30 p.m. will not be returned until the following business day.  For prescription refill requests, have your pharmacy contact our office.

## 2014-09-28 NOTE — Progress Notes (Signed)
Renato Shin, MD [address] [place] [address]  Lymphadenopathy, retroperitoneal  CURRENT THERAPY: Work-up  INTERVAL HISTORY: Aaron Fox 59 y.o. male returns for followup of lymphadenopathy.  I personally reviewed and went over radiographic studies with the patient.  The results are noted within this dictation.  I reviewed PET imaging in detail with the patient.  The report follows below.  I provided him education regarding lymphadenopathy.  I provided him education regarding his PET scan results.    I have recommended Gen Surg consult for excisional lymph node biopsy of right inguinal node.  Past Medical History  Diagnosis Date  . DIABETES MELLITUS, TYPE II 09/22/2006  . HYPOGONADISM, MALE 01/15/2007  . HYPERCHOLESTEROLEMIA 07/02/2008  . GOUT 09/22/2006  . HYPERTENSION 05/31/2009  . CHF 06/19/2008  . ALLERGIC RHINITIS 07/02/2008  . 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 Rheumatoid arthritis(714.0) 09/22/2006  . COUGH DUE TO ACE INHIBITORS 10/05/2008  . Atrial fibrillation   . ED (erectile dysfunction)   . PUD (peptic ulcer disease)   . Hypokalemia   . Lymphadenopathy   . Glaucoma   . Peripheral neuropathy     has DIABETES MELLITUS, TYPE II; Hypogonadism male; HYPERCHOLESTEROLEMIA; Gout; HYPOKALEMIA; Essential hypertension; CHF; ALLERGIC RHINITIS; MICROSCOPIC HEMATURIA; BALANITIS; IMPOTENCE OF ORGANIC ORIGIN; RHEUMATOID ARTHRITIS; LEG PAIN, BILATERAL; Encounter for long-term (current) use of other medications; Disturbance of skin sensation; Screening for prostate cancer; Inflammatory and toxic neuropathy, unspecified; Shoulder pain, right; Numbness and tingling in hands; Dyspnea; Arthralgia; Routine general medical examination at a health care facility; Piriformis syndrome of left side; Type 1 diabetes mellitus with neurological manifestations, uncontrolled; Diabetic foot ulcer; Wellness examination; PSA elevation; Iron deficiency anemia; and  Lymphadenopathy, retroperitoneal on his problem list.     is allergic to penicillins; terbinafine hcl; and zestril.  Current Outpatient Prescriptions on File Prior to Visit  Medication Sig Dispense Refill  . aspirin 81 MG tablet Take 81 mg by mouth daily.      Marland Kitchen gabapentin (NEURONTIN) 300 MG capsule Take 300 mg by mouth at bedtime.    Marland Kitchen glucose blood (ONE TOUCH ULTRA TEST) test strip 1 each by Other route 2 (two) times daily. And lancets 2/day 250.03 100 each 12  . insulin NPH Human (HUMULIN N,NOVOLIN N) 100 UNIT/ML injection Inject 65 Units into the skin every morning.     . Insulin Syringe-Needle U-100 (B-D INSULIN SYRINGE 2CC/27.5G) 27.5G X 5/8" 2 ML MISC Use as directed once daily     . allopurinol (ZYLOPRIM) 100 MG tablet Take 1 tablet (100 mg total) by mouth daily. (Patient not taking: Reported on 09/17/2014) 30 tablet 6  . atorvastatin (LIPITOR) 10 MG tablet Take 1 tablet (10 mg total) by mouth daily. (Patient not taking: Reported on 09/17/2014) 90 tablet 3   No current facility-administered medications on file prior to visit.    Past Surgical History  Procedure Laterality Date  . Lymph node biopsy    . Cataract extraction, bilateral    . Colonoscopy  2004    Dnies any headaches, dizziness, double vision, fevers, chills, night sweats, nausea, vomiting, diarrhea, constipation, chest pain, heart palpitations, shortness of breath, blood in stool, black tarry stool, urinary pain, urinary burning, urinary frequency, hematuria.   PHYSICAL EXAMINATION  ECOG PERFORMANCE STATUS: 0 - Asymptomatic  Filed Vitals:   09/28/14 1130  BP: 143/75  Pulse: 87  Temp: 98.9 F (37.2 C)  Resp: 18    GENERAL:alert, no distress,  well nourished, well developed, comfortable, obese, smiling and unaccompanied SKIN: skin color, texture, turgor are normal, no rashes or significant lesions HEAD: Normocephalic, No masses, lesions, tenderness or abnormalities EYES: normal, PERRLA, EOMI, Conjunctiva are  pink and non-injected EARS: External ears normal OROPHARYNX:lips, buccal mucosa, and tongue normal and mucous membranes are moist  NECK: supple, trachea midline LYMPH:  Not examined BREAST:not examined LUNGS: not examined HEART: not examined ABDOMEN:abdomen soft BACK: Back symmetric, no curvature. EXTREMITIES:less then 2 second capillary refill, no joint deformities, effusion, or inflammation, no skin discoloration, no cyanosis  NEURO: alert & oriented x 3 with fluent speech, no focal motor/sensory deficits, gait normal   LABORATORY DATA: CBC    Component Value Date/Time   WBC 4.6 09/17/2014 1625   WBC 4.2 12/29/2008 0849   RBC 4.38 09/17/2014 1625   RBC 4.75 12/29/2008 0849   HGB 12.3* 09/17/2014 1625   HGB 13.8 12/29/2008 0849   HCT 34.8* 09/17/2014 1625   HCT 39.6 12/29/2008 0849   PLT 178 09/17/2014 1625   PLT 205 12/29/2008 0849   MCV 79.5 09/17/2014 1625   MCV 83.3 12/29/2008 0849   MCH 28.1 09/17/2014 1625   MCH 29.1 12/29/2008 0849   MCHC 35.3 09/17/2014 1625   MCHC 34.9 12/29/2008 0849   RDW 13.3 09/17/2014 1625   RDW 13.1 12/29/2008 0849   LYMPHSABS 2.2 09/17/2014 1625   LYMPHSABS 1.8 12/29/2008 0849   MONOABS 0.5 09/17/2014 1625   MONOABS 0.5 12/29/2008 0849   EOSABS 0.4 09/17/2014 1625   EOSABS 0.2 12/29/2008 0849   BASOSABS 0.1 09/17/2014 1625   BASOSABS 0.0 12/29/2008 0849      Chemistry      Component Value Date/Time   NA 132* 09/17/2014 1625   K 4.0 09/17/2014 1625   CL 102 09/17/2014 1625   CO2 23 09/17/2014 1625   BUN 23* 09/17/2014 1625   CREATININE 1.30* 09/17/2014 1625      Component Value Date/Time   CALCIUM 9.1 09/17/2014 1625   ALKPHOS 101 09/17/2014 1625   AST 23 09/17/2014 1625   ALT 18 09/17/2014 1625   BILITOT 0.5 09/17/2014 1625     Lab Results  Component Value Date   ESRSEDRATE 78* 09/17/2014   Lab Results  Component Value Date   PSA 8.63* 05/21/2014   PSA 2.62 04/04/2013   PSA 3.04 11/13/2011   Results for  UZAIR, GODLEY (MRN [id number]) as of 09/28/2014 10:48  Ref. Range 09/17/2014 16:25  [facility] Latest Ref Range: 98-192 U/L 175     PENDING LABS:   RADIOGRAPHIC STUDIES:  Nm Pet Image Initial (pi) Skull Base To Thigh  09/25/2014   CLINICAL DATA:  Initial Treatment strategy for retroperitoneal adenopathy.  EXAM: NUCLEAR MEDICINE PET SKULL BASE TO THIGH  TECHNIQUE: 11.3 mCi F-18 FDG was injected intravenously. Full-ring PET imaging was performed from the skull base to thigh after the radiotracer. CT data was obtained and used for attenuation correction and anatomic localization.  FASTING BLOOD GLUCOSE:  Value: 257 mg/dl  COMPARISON:  CT 08/19/2014.  FINDINGS: NECK  No hypermetabolic lymph nodes in the neck.  CHEST  Left supraclavicular lymph node is prominent measuring 1 cm. The SUV max associated this lymph node is equal to 3.26. Prominent left paratracheal lymph node measures 1 cm and has an SUV max equal to 3.19.  No pleural effusion identified. Advanced changes of centrilobular and paraseptal emphysema noted. There is a nodule within the right apex measuring 5 mm, image 15/series 6. This is  too small to characterize. No hypermetabolic pulmonary nodules identified. No hypermetabolic mediastinal or hilar nodes. No suspicious pulmonary nodules on the CT scan.  ABDOMEN/PELVIS  Within the lateral segment of left lobe of liver there is a focal area of increased uptake which measures approximately 1.9 cm. The SUV max is equal to 5.87 compared to a background SUV of 3.8. The spleen is upper limits of normal in size measuring 11 cm. No abnormal hyper metabolism identified within the splenic parenchyma. There is no corresponding abnormality on the CT images within the left lobe of liver.  Aortic atherosclerosis noted. No aneurysm. Mild upper abdominal adenopathy is again noted. The index periaortic lymph node referenced on previous exam measures 1.5 cm and has an SUV max equal to 2.7. More distally there is an 11 mm  periaortic lymph node which has an SUV max equal to 3.7. The index right common iliac node measure 1 cm and has an SUV max equal to 4.73. Bilateral external iliac and inguinal adenopathy is again noted. Index right inguinal node measures 1.5 cm and has an SUV max equal to 5.7. Left external iliac node measures 1.6 cm and has an SUV max equal to 4.02  SKELETON  No focal hypermetabolic activity to suggest skeletal metastasis.  IMPRESSION: 1. Adenopathy within the abdomen and pelvis is again identified. The largest and most hypermetabolic nodes are found within the pelvis and in particular the right inguinal region. Findings are concerning for lymphoproliferative disorder. Correlation with tissue sampling is recommended. The right inguinal lymph node appears to be easily accessible via a image guided percutaneous approach. 2. Borderline mediastinal and left supraclavicular lymph nodes which exhibit mild nonspecific increased FDG uptake. 3. Focal area of increased uptake within the lateral segment of left lobe of liver is noted. There is no corresponding CT abnormality. Significance un known. If there is a concern for liver involvement then a contrast enhanced MRI of the liver may provide a more definitive assessment of this area. 4. Emphysema. 5. Aortic atherosclerosis. 6. Indeterminate right apical nodule measures 5 mm. Too small to characterize. Given the smoking related changes within the lungs followup imaging is advised to ensure stability. If the patient is at high risk for bronchogenic carcinoma, follow-up chest CT at 6-12 months is recommended. If the patient is at low risk for bronchogenic carcinoma, follow-up chest CT at 12 months is recommended. This recommendation follows the consensus statement: Guidelines for Management of Small Pulmonary Nodules Detected on CT Scans: A Statement from the [place] as published in Radiology 2005;237:395-400.   Electronically Signed   By: Kerby Moors M.D.   On:  09/25/2014 11:34     PATHOLOGY:    ASSESSMENT AND PLAN:  Lymphadenopathy, retroperitoneal Abdominal and pelvic adenopathy with a right inguinal node that is enlarged and hypermetabolic.  Previous biopsy has been negative for malignancy and instead demonstrated lymphoid hyperplasia.    Will refer the patient to Dr. Arnoldo Morale for excisional lymph node biopsy.  If biopsy remains negative, will likely have to pursue a biopsy of another area, possibly mediastinum.  Return in 2-3 weeks for follow-up after biopsy.    THERAPY PLAN:  Further work-up needed with excisional lymph node biopsy.  All questions were answered. The patient knows to call the clinic with any problems, questions or concerns. We can certainly see the patient much sooner if necessary.  Patient and plan discussed with Dr. Ancil Linsey and she is in agreement with the aforementioned.   This note is  electronically signed by: Doy Mince 09/28/2014 12:20 PM

## 2014-10-05 ENCOUNTER — Telehealth: Payer: Self-pay

## 2014-10-05 DIAGNOSIS — R590 Localized enlarged lymph nodes: Secondary | ICD-10-CM

## 2014-10-05 NOTE — Telephone Encounter (Signed)
See notes below to be advised about referral that was placed.

## 2014-10-05 NOTE — Telephone Encounter (Signed)
I will work on this and get him an appt.

## 2014-10-05 NOTE — Telephone Encounter (Signed)
done

## 2014-10-05 NOTE — Telephone Encounter (Signed)
Aaron Fox called from [facility] requesting a referral to be placed to a general suergon. Pt is having a excisional lymph node biopsy with Dr. Aviva Fox in [place]. Pt has an appointment on 10/08/2014 with Dr. Arnoldo Fox. Can we place this referral?  Dr. Arnoldo Fox office: Fax: [phone] Phone: [phone]. (contact is [employer])

## 2014-10-09 NOTE — H&P (Signed)
  NTS SOAP Note  Vital Signs:  Vitals as of: 1/94/1740: Systolic 814: Diastolic 80: Heart Rate 84: Temp 97.53F: Height 53ft 11in: Weight 226Lbs 0 Ounces: BMI 31.52  BMI : 31.52 kg/m2  Subjective: This 59 year old male presents for of lymphadenopathy.  Is referred for right inguinal lymph node biopsy.  Palpable.  Seen on PET scan.  Review of Symptoms:  Constitutional:unremarkable   Head:unremarkable Eyes:unremarkable   Nose/Mouth/Throat:unremarkable Cardiovascular:  unremarkable Respiratory:dyspnea Gastrointestinal:  unremarkable   Genitourinary:unremarkable   neck, back, and joint pain Skin:unremarkable lymph node swelling Allergic/Immunologic:unremarkable   Past Medical History:  Reviewed  Past Medical History  Surgical History: left groin lymph node biopsy Medical Problems: IDDM Allergies: pcn, lamisil Medications: insulin, gabapentin   Social History:Reviewed  Social History  Preferred Language: English Race:  Black or African American Ethnicity: Not Hispanic / Latino Age: 59 year Marital Status:  M Alcohol: no   Smoking Status: Never smoker reviewed on 10/08/2014 Functional Status reviewed on 10/08/2014 ------------------------------------------------ Bathing: Normal Cooking: Normal Dressing: Normal Driving: Normal Eating: Normal Managing Meds: Normal Oral Care: Normal Shopping: Normal Toileting: Normal Transferring: Normal Walking: Normal Cognitive Status reviewed on 10/08/2014 ------------------------------------------------ Attention: Normal Decision Making: Normal Language: Normal Memory: Normal Motor: Normal Perception: Normal Problem Solving: Normal Visual and Spatial: Normal   Family History:Reviewed  Family Health History Mother  Father, Unknown; Heart attack (myocardial infarction);     Objective Information: General:Well appearing, well nourished in no distress. Heart:RRR, no murmur Lungs:  CTA  bilaterally, no wheezes, rhonchi, rales.  Breathing unlabored. Palpable right inguinal lymph nodes  Assessment:Inguinal lymphadenopathy  Diagnoses: 785.6  R59.0 Inguinal lymphadenopathy (Localized enlarged lymph nodes)  Procedures: 48185 - OFFICE OUTPATIENT NEW 30 MINUTES    Plan:  Scheduled for right inguinal lymph node biopsy on 10/16/14.   Patient Education:Alternative treatments to surgery were discussed with patient (and family).  Risks and benefits  of procedure were fully explained to the patient (and family) who gave informed consent. Patient/family questions were addressed.  Follow-up:Pending Surgery

## 2014-10-13 ENCOUNTER — Encounter (HOSPITAL_COMMUNITY): Payer: Self-pay | Admitting: Hematology & Oncology

## 2014-10-13 NOTE — Patient Instructions (Signed)
Aaron Fox  10/13/2014     @PREFPERIOPPHARMACY @   Your procedure is scheduled on 10/16/2014  Report to [facility] at 8:10 A.M.  Call this number if you have problems the morning of surgery:  [phone]   Remember:  Do not eat food or drink liquids after midnight.  Take these medicines the morning of surgery with A SIP OF WATER Allopurinol, gabapentin   DO NOT TAKE AM DOSE OF INSULIN DAY OF PROCEDURE.  TAKE ONLY 1/2 OF EVENING DOSE NIGHT PRIOR TO PROCEDURE.   Do not wear jewelry, make-up or nail polish.  Do not wear lotions, powders, or perfumes.  You may wear deodorant.  Do not shave 48 hours prior to surgery.  Men may shave face and neck.  Do not bring valuables to the hospital.  [facility] is not responsible for any belongings or valuables.  Contacts, dentures or bridgework may not be worn into surgery.  Leave your suitcase in the car.  After surgery it may be brought to your room.  For patients admitted to the hospital, discharge time will be determined by your treatment team.  Patients discharged the day of surgery will not be allowed to drive home.   Please read over the following fact sheets that you were given. Surgical Site Infection Prevention and Anesthesia Post-op Instructions     PATIENT INSTRUCTIONS POST-ANESTHESIA  IMMEDIATELY FOLLOWING SURGERY:  Do not drive or operate machinery for the first twenty four hours after surgery.  Do not make any important decisions for twenty four hours after surgery or while taking narcotic pain medications or sedatives.  If you develop intractable nausea and vomiting or a severe headache please notify your doctor immediately.  FOLLOW-UP:  Please make an appointment with your surgeon as instructed. You do not need to follow up with anesthesia unless specifically instructed to do so.  WOUND CARE INSTRUCTIONS (if applicable):  Keep a dry clean dressing on the anesthesia/puncture wound site if there is drainage.  Once  the wound has quit draining you may leave it open to air.  Generally you should leave the bandage intact for twenty four hours unless there is drainage.  If the epidural site drains for more than 36-48 hours please call the anesthesia department.  QUESTIONS?:  Please feel free to call your physician or the hospital operator if you have any questions, and they will be happy to assist you.      Lymphadenopathy Lymphadenopathy means "disease of the lymph glands." But the term is usually used to describe swollen or enlarged lymph glands, also called lymph nodes. These are the bean-shaped organs found in many locations including the neck, underarm, and groin. Lymph glands are part of the immune system, which fights infections in your body. Lymphadenopathy can occur in just one area of the body, such as the neck, or it can be generalized, with lymph node enlargement in several areas. The nodes found in the neck are the most common sites of lymphadenopathy. CAUSES When your immune system responds to germs (such as viruses or bacteria ), infection-fighting cells and fluid build up. This causes the glands to grow in size. Usually, this is not something to worry about. Sometimes, the glands themselves can become infected and inflamed. This is called lymphadenitis. Enlarged lymph nodes can be caused by many diseases:  Bacterial disease, such as strep throat or a skin infection.  Viral disease, such as a common cold.  Other germs, such as Lyme disease, tuberculosis, or  sexually transmitted diseases.  Cancers, such as lymphoma (cancer of the lymphatic system) or leukemia (cancer of the white blood cells).  Inflammatory diseases such as lupus or rheumatoid arthritis.  Reactions to medications. Many of the diseases above are rare, but important. This is why you should see your caregiver if you have lymphadenopathy. SYMPTOMS  Swollen, enlarged lumps in the neck, back of the head, or other  locations.  Tenderness.  Warmth or redness of the skin over the lymph nodes.  Fever. DIAGNOSIS Enlarged lymph nodes are often near the source of infection. They can help health care providers diagnose your illness. For instance:  Swollen lymph nodes around the jaw might be caused by an infection in the mouth.  Enlarged glands in the neck often signal a throat infection.  Lymph nodes that are swollen in more than one area often indicate an illness caused by a virus. Your caregiver will likely know what is causing your lymphadenopathy after listening to your history and examining you. Blood tests, x-rays, or other tests may be needed. If the cause of the enlarged lymph node cannot be found, and it does not go away by itself, then a biopsy may be needed. Your caregiver will discuss this with you. TREATMENT Treatment for your enlarged lymph nodes will depend on the cause. Many times the nodes will shrink to normal size by themselves, with no treatment. Antibiotics or other medicines may be needed for infection. Only take over-the-counter or prescription medicines for pain, discomfort, or fever as directed by your caregiver. HOME CARE INSTRUCTIONS Swollen lymph glands usually return to normal when the underlying medical condition goes away. If they persist, contact your health-care provider. He/she might prescribe antibiotics or other treatments, depending on the diagnosis. Take any medications exactly as prescribed. Keep any follow-up appointments made to check on the condition of your enlarged nodes. SEEK MEDICAL CARE IF:  Swelling lasts for more than two weeks.  You have symptoms such as weight loss, night sweats, fatigue, or fever that does not go away.  The lymph nodes are hard, seem fixed to the skin, or are growing rapidly.  Skin over the lymph nodes is red and inflamed. This could mean there is an infection. SEEK IMMEDIATE MEDICAL CARE IF:  Fluid starts leaking from the area of the  enlarged lymph node.  You develop a fever of 102 F (38.9 C) or greater.  Severe pain develops (not necessarily at the site of a large lymph node).  You develop chest pain or shortness of breath.  You develop worsening abdominal pain. MAKE SURE YOU:  Understand these instructions.  Will watch your condition.  Will get help right away if you are not doing well or get worse. Document Released: 11/23/2007 Document Revised: 06/30/2013 Document Reviewed: 11/23/2007 [facility] Patient Information 2015 [place], [place]. This information is not intended to replace advice given to you by your health care provider. Make sure you discuss any questions you have with your health care provider.

## 2014-10-14 ENCOUNTER — Encounter (HOSPITAL_COMMUNITY)
Admission: RE | Admit: 2014-10-14 | Discharge: 2014-10-14 | Disposition: A | Discharge status: Home / Self Care | Payer: 59 | Source: Ambulatory Visit | Attending: General Surgery | Admitting: General Surgery

## 2014-10-14 ENCOUNTER — Encounter (HOSPITAL_COMMUNITY): Payer: Self-pay

## 2014-10-14 ENCOUNTER — Other Ambulatory Visit: Payer: Self-pay

## 2014-10-14 DIAGNOSIS — Z79899 Other long term (current) drug therapy: Secondary | ICD-10-CM | POA: Diagnosis not present

## 2014-10-14 DIAGNOSIS — R590 Localized enlarged lymph nodes: Secondary | ICD-10-CM | POA: Diagnosis present

## 2014-10-14 DIAGNOSIS — I10 Essential (primary) hypertension: Secondary | ICD-10-CM | POA: Diagnosis not present

## 2014-10-14 DIAGNOSIS — I889 Nonspecific lymphadenitis, unspecified: Secondary | ICD-10-CM | POA: Diagnosis not present

## 2014-10-14 DIAGNOSIS — Z794 Long term (current) use of insulin: Secondary | ICD-10-CM | POA: Diagnosis not present

## 2014-10-14 DIAGNOSIS — Z01812 Encounter for preprocedural laboratory examination: Secondary | ICD-10-CM | POA: Diagnosis not present

## 2014-10-14 DIAGNOSIS — Z01810 Encounter for preprocedural cardiovascular examination: Secondary | ICD-10-CM | POA: Diagnosis not present

## 2014-10-14 DIAGNOSIS — E119 Type 2 diabetes mellitus without complications: Secondary | ICD-10-CM | POA: Diagnosis not present

## 2014-10-14 DIAGNOSIS — Z7982 Long term (current) use of aspirin: Secondary | ICD-10-CM | POA: Diagnosis not present

## 2014-10-14 LAB — BASIC METABOLIC PANEL
Anion gap: 10 (ref 5–15)
BUN: 21 mg/dL — ABNORMAL HIGH (ref 6–20)
CALCIUM: 9 mg/dL (ref 8.9–10.3)
CO2: 21 mmol/L — AB (ref 22–32)
CREATININE: 1.24 mg/dL (ref 0.61–1.24)
Chloride: 105 mmol/L (ref 101–111)
GFR calc non Af Amer: >60 mL/min (ref >60)
GLUCOSE: 247 mg/dL — AB (ref 65–99)
Potassium: 4 mmol/L (ref 3.5–5.1)
Sodium: 136 mmol/L (ref 135–145)

## 2014-10-14 LAB — CBC
HEMATOCRIT: 31.5 % — AB (ref 39.0–52.0)
Hemoglobin: 10.9 g/dL — ABNORMAL LOW (ref 13.0–17.0)
MCH: 27.8 pg (ref 26.0–34.0)
MCHC: 34.6 g/dL (ref 30.0–36.0)
MCV: 80.4 fL (ref 78.0–100.0)
Platelets: 214 10*3/uL (ref 150–400)
RBC: 3.92 MIL/uL — ABNORMAL LOW (ref 4.22–5.81)
RDW: 13.3 % (ref 11.5–15.5)
WBC: 4.4 10*3/uL (ref 4.0–10.5)

## 2014-10-15 ENCOUNTER — Ambulatory Visit (HOSPITAL_COMMUNITY): Payer: 59 | Admitting: Hematology & Oncology

## 2014-10-15 ENCOUNTER — Ambulatory Visit (HOSPITAL_COMMUNITY): Payer: 59 | Admitting: Oncology

## 2014-10-16 ENCOUNTER — Ambulatory Visit (HOSPITAL_COMMUNITY): Payer: 59 | Admitting: Anesthesiology

## 2014-10-16 ENCOUNTER — Encounter (HOSPITAL_COMMUNITY)
Admission: RE | Disposition: A | Discharge status: Home / Self Care | Payer: Self-pay | Source: Ambulatory Visit | Attending: General Surgery

## 2014-10-16 ENCOUNTER — Encounter (HOSPITAL_COMMUNITY): Payer: Self-pay | Admitting: *Deleted

## 2014-10-16 ENCOUNTER — Ambulatory Visit (HOSPITAL_COMMUNITY)
Admission: RE | Admit: 2014-10-16 | Discharge: 2014-10-16 | Disposition: A | Discharge status: Home / Self Care | Payer: 59 | Source: Ambulatory Visit | Attending: General Surgery | Admitting: General Surgery

## 2014-10-16 DIAGNOSIS — Z79899 Other long term (current) drug therapy: Secondary | ICD-10-CM | POA: Insufficient documentation

## 2014-10-16 DIAGNOSIS — I889 Nonspecific lymphadenitis, unspecified: Secondary | ICD-10-CM | POA: Diagnosis not present

## 2014-10-16 DIAGNOSIS — Z01812 Encounter for preprocedural laboratory examination: Secondary | ICD-10-CM | POA: Insufficient documentation

## 2014-10-16 DIAGNOSIS — Z794 Long term (current) use of insulin: Secondary | ICD-10-CM | POA: Insufficient documentation

## 2014-10-16 DIAGNOSIS — Z7982 Long term (current) use of aspirin: Secondary | ICD-10-CM | POA: Insufficient documentation

## 2014-10-16 DIAGNOSIS — Z01810 Encounter for preprocedural cardiovascular examination: Secondary | ICD-10-CM | POA: Insufficient documentation

## 2014-10-16 DIAGNOSIS — I10 Essential (primary) hypertension: Secondary | ICD-10-CM | POA: Insufficient documentation

## 2014-10-16 DIAGNOSIS — E119 Type 2 diabetes mellitus without complications: Secondary | ICD-10-CM | POA: Insufficient documentation

## 2014-10-16 HISTORY — PX: LYMPH NODE BIOPSY: SHX201

## 2014-10-16 LAB — GLUCOSE, CAPILLARY
GLUCOSE-CAPILLARY: 126 mg/dL — AB (ref 65–99)
Glucose-Capillary: 120 mg/dL — ABNORMAL HIGH (ref 65–99)

## 2014-10-16 SURGERY — LYMPH NODE BIOPSY
Anesthesia: General | Site: Abdomen | Laterality: Right

## 2014-10-16 MED ORDER — MIDAZOLAM HCL 2 MG/2ML IJ SOLN
1.0000 mg | INTRAMUSCULAR | Status: DC | PRN
  Administered 2014-10-16: 2 mg via INTRAVENOUS

## 2014-10-16 MED ORDER — OXYCODONE-ACETAMINOPHEN 7.5-325 MG PO TABS: 1.0000 | ORAL_TABLET | ORAL | Status: DC | PRN

## 2014-10-16 MED ORDER — LIDOCAINE HCL (PF) 1 % IJ SOLN
INTRAMUSCULAR | Status: AC
  Filled 2014-10-16: qty 5

## 2014-10-16 MED ORDER — FENTANYL CITRATE (PF) 100 MCG/2ML IJ SOLN: 25.0000 ug | INTRAMUSCULAR | Status: DC | PRN

## 2014-10-16 MED ORDER — BUPIVACAINE HCL (PF) 0.5 % IJ SOLN
INTRAMUSCULAR | Status: AC
  Filled 2014-10-16: qty 30

## 2014-10-16 MED ORDER — MIDAZOLAM HCL 2 MG/2ML IJ SOLN
INTRAMUSCULAR | Status: AC
  Filled 2014-10-16: qty 2

## 2014-10-16 MED ORDER — VANCOMYCIN HCL IN DEXTROSE 1-5 GM/200ML-% IV SOLN
1000.0000 mg | INTRAVENOUS | Status: AC
  Administered 2014-10-16: 1000 mg via INTRAVENOUS

## 2014-10-16 MED ORDER — CHLORHEXIDINE GLUCONATE 4 % EX LIQD: 1.0000 "application " | Freq: Once | CUTANEOUS | Status: DC

## 2014-10-16 MED ORDER — FENTANYL CITRATE (PF) 100 MCG/2ML IJ SOLN
25.0000 ug | INTRAMUSCULAR | Status: AC
  Administered 2014-10-16 (×2): 25 ug via INTRAVENOUS

## 2014-10-16 MED ORDER — BUPIVACAINE HCL (PF) 0.5 % IJ SOLN
INTRAMUSCULAR | Status: DC | PRN
  Administered 2014-10-16: 10 mL

## 2014-10-16 MED ORDER — LIDOCAINE HCL (CARDIAC) 20 MG/ML IV SOLN
INTRAVENOUS | Status: DC | PRN
  Administered 2014-10-16: 50 mg via INTRAVENOUS

## 2014-10-16 MED ORDER — FENTANYL CITRATE (PF) 100 MCG/2ML IJ SOLN
INTRAMUSCULAR | Status: DC | PRN
  Administered 2014-10-16: 50 ug via INTRAVENOUS
  Administered 2014-10-16 (×2): 25 ug via INTRAVENOUS

## 2014-10-16 MED ORDER — ONDANSETRON HCL 4 MG/2ML IJ SOLN
4.0000 mg | Freq: Once | INTRAMUSCULAR | Status: AC
  Administered 2014-10-16: 4 mg via INTRAVENOUS

## 2014-10-16 MED ORDER — 0.9 % SODIUM CHLORIDE (POUR BTL) OPTIME
TOPICAL | Status: DC | PRN
  Administered 2014-10-16: 1000 mL

## 2014-10-16 MED ORDER — ONDANSETRON HCL 4 MG/2ML IJ SOLN: 4.0000 mg | Freq: Once | INTRAMUSCULAR | Status: DC | PRN

## 2014-10-16 MED ORDER — VANCOMYCIN HCL IN DEXTROSE 1-5 GM/200ML-% IV SOLN
INTRAVENOUS | Status: AC
  Filled 2014-10-16: qty 200

## 2014-10-16 MED ORDER — LACTATED RINGERS IV SOLN
INTRAVENOUS | Status: DC
  Administered 2014-10-16: 1000 mL via INTRAVENOUS

## 2014-10-16 MED ORDER — PROPOFOL 10 MG/ML IV BOLUS
INTRAVENOUS | Status: DC | PRN
  Administered 2014-10-16: 150 mg via INTRAVENOUS

## 2014-10-16 MED ORDER — PROPOFOL 10 MG/ML IV BOLUS
INTRAVENOUS | Status: AC
  Filled 2014-10-16: qty 20

## 2014-10-16 MED ORDER — ONDANSETRON HCL 4 MG/2ML IJ SOLN
INTRAMUSCULAR | Status: AC
  Filled 2014-10-16: qty 2

## 2014-10-16 MED ORDER — FENTANYL CITRATE (PF) 100 MCG/2ML IJ SOLN
INTRAMUSCULAR | Status: AC
  Filled 2014-10-16: qty 2

## 2014-10-16 SURGICAL SUPPLY — 37 items
APPLIER CLIP 9.375 SM OPEN (CLIP)
APR CLP SM 9.3 20 MLT OPN (CLIP)
BAG HAMPER (MISCELLANEOUS) ×3 IMPLANT
CLIP APPLIE 9.375 SM OPEN (CLIP) IMPLANT
CLOTH BEACON ORANGE TIMEOUT ST (SAFETY) ×3 IMPLANT
COVER LIGHT HANDLE STERIS (MISCELLANEOUS) ×6 IMPLANT
DECANTER SPIKE VIAL GLASS SM (MISCELLANEOUS) ×3 IMPLANT
ELECT REM PT RETURN 9FT ADLT (ELECTROSURGICAL) ×3
ELECTRODE REM PT RTRN 9FT ADLT (ELECTROSURGICAL) ×1 IMPLANT
FORMALIN 10 PREFIL 120ML (MISCELLANEOUS) ×1 IMPLANT
GLOVE BIOGEL PI IND STRL 7.0 (GLOVE) IMPLANT
GLOVE BIOGEL PI INDICATOR 7.0 (GLOVE) ×2
GLOVE EXAM NITRILE MD LF STRL (GLOVE) ×2 IMPLANT
GLOVE SS BIOGEL STRL SZ 6.5 (GLOVE) IMPLANT
GLOVE SUPERSENSE BIOGEL SZ 6.5 (GLOVE) ×2
GLOVE SURG SS PI 7.5 STRL IVOR (GLOVE) ×3 IMPLANT
GOWN STRL REUS W/TWL LRG LVL3 (GOWN DISPOSABLE) ×6 IMPLANT
KIT CLEAN CATCH URINE (SET/KITS/TRAYS/PACK) ×3 IMPLANT
KIT ROOM TURNOVER APOR (KITS) ×3 IMPLANT
LIQUID BAND (GAUZE/BANDAGES/DRESSINGS) ×2 IMPLANT
MANIFOLD NEPTUNE II (INSTRUMENTS) ×3 IMPLANT
NDL HYPO 25X1 1.5 SAFETY (NEEDLE) ×1 IMPLANT
NEEDLE HYPO 25X1 1.5 SAFETY (NEEDLE) ×3 IMPLANT
NS IRRIG 1000ML POUR BTL (IV SOLUTION) ×3 IMPLANT
PACK MINOR (CUSTOM PROCEDURE TRAY) ×3 IMPLANT
PAD ARMBOARD 7.5X6 YLW CONV (MISCELLANEOUS) ×3 IMPLANT
PAD TELFA 3X4 1S STER (GAUZE/BANDAGES/DRESSINGS) IMPLANT
SCRUB PCMX 4 OZ (MISCELLANEOUS) ×3 IMPLANT
SET BASIN LINEN APH (SET/KITS/TRAYS/PACK) ×3 IMPLANT
SHEARS HARMONIC 9CM CVD (BLADE) IMPLANT
SPONGE INTESTINAL PEANUT (DISPOSABLE) ×3 IMPLANT
SUT SILK 2 0 (SUTURE) ×3
SUT SILK 2-0 18XBRD TIE 12 (SUTURE) ×1 IMPLANT
SUT VIC AB 3-0 SH 27 (SUTURE) ×3
SUT VIC AB 3-0 SH 27X BRD (SUTURE) ×1 IMPLANT
SUT VIC AB 4-0 PS2 27 (SUTURE) ×3 IMPLANT
SYR CONTROL 10ML LL (SYRINGE) ×3 IMPLANT

## 2014-10-16 NOTE — Anesthesia Procedure Notes (Signed)
Procedure Name: LMA Insertion Date/Time: 10/16/2014 9:43 AM Performed by: Andree Elk, AMY A Pre-anesthesia Checklist: Patient identified, Timeout performed, Emergency Drugs available, Suction available and Patient being monitored Patient Re-evaluated:Patient Re-evaluated prior to inductionOxygen Delivery Method: Circle system utilized Intubation Type: IV induction Ventilation: Mask ventilation without difficulty LMA: LMA inserted LMA Size: 5.0 Number of attempts: 1 Placement Confirmation: positive ETCO2 and breath sounds checked- equal and bilateral Tube secured with: Tape Dental Injury: Teeth and Oropharynx as per pre-operative assessment

## 2014-10-16 NOTE — Anesthesia Preprocedure Evaluation (Signed)
Anesthesia Evaluation  Patient identified by MRN, date of birth, ID band Patient awake    Reviewed: Allergy & Precautions, NPO status , Patient's Chart, lab work & pertinent test results  Airway Mallampati: II  TM Distance: >3 FB     Dental  (+) Edentulous Upper, Dental Advisory Given   Pulmonary shortness of breath and with exertion, former smoker,  breath sounds clear to auscultation        Cardiovascular hypertension, Pt. on medications +CHF + dysrhythmias Atrial Fibrillation Rhythm:Regular Rate:Normal     Neuro/Psych  Neuromuscular disease (peripheral neuropathy)    GI/Hepatic PUD,   Endo/Other  diabetes, Well Controlled, Type 2, Insulin Dependent  Renal/GU      Musculoskeletal  (+) Arthritis -, Rheumatoid disorders,    Abdominal   Peds  Hematology   Anesthesia Other Findings   Reproductive/Obstetrics                             Anesthesia Physical Anesthesia Plan  ASA: III  Anesthesia Plan: General   Post-op Pain Management:    Induction: Intravenous  Airway Management Planned: LMA  Additional Equipment:   Intra-op Plan:   Post-operative Plan: Extubation in OR  Informed Consent: I have reviewed the patients History and Physical, chart, labs and discussed the procedure including the risks, benefits and alternatives for the proposed anesthesia with the patient or authorized representative who has indicated his/her understanding and acceptance.     Plan Discussed with:   Anesthesia Plan Comments:         Anesthesia Quick Evaluation

## 2014-10-16 NOTE — Op Note (Signed)
Patient:  Aaron Fox  DOB:  1955-07-20  MRN:  [id number]   Preop Diagnosis:  Lymphadenopathy  Postop Diagnosis:  Same  Procedure:  Right inguinal lymph node biopsy  Surgeon:  Aviva Signs, M.D.  Anes:  Gen.  Indications:  Patient is a 59 year old black male who presents with diffuse lymphadenopathy. He is being followed by oncology and they request lymph node biopsy to rule out lymphoma. The risks and benefits of the procedure including bleeding, infection, and swelling of the surgical area were fully explained to the patient, who gave informed consent.  Procedure note:  The patient was placed the supine position. After general anesthesia was administered, the right groin region was prepped and draped using the usual sterile technique with PCMX. Surgical site confirmation was performed.  An incision was made in the right groin crease down to the subcutaneous tissue. Several palpable lymph nodes were found. These were removed without difficulty. They were sent to pathology for flow cytometry. A bleeding was controlled using Bovie electrocautery. 0.5% Sensorcaine was instilled the surrounding wound. The subcutaneous layer was reapproximated using a 3-0 Vicryl interrupted suture. The skin was closed using a 4-0 Vicryl subcuticular suture. Liquiband was applied.  All tape and needle counts were correct at the end of the procedure. Patient was awakened and transferred to PACU in stable condition.    Complications:  None  EBL:  Minimal  Specimen:  Right inguinal lymph nodes

## 2014-10-16 NOTE — Interval H&P Note (Signed)
History and Physical Interval Note:  10/16/2014 8:50 AM  Aaron Fox  has presented today for surgery, with the diagnosis of lymphadenopathy  The various methods of treatment have been discussed with the patient and family. After consideration of risks, benefits and other options for treatment, the patient has consented to  Procedure(s): INGUINAL LYMPH NODE BIOPSY (Right) as a surgical intervention .  The patient's history has been reviewed, patient examined, no change in status, stable for surgery.  I have reviewed the patient's chart and labs.  Questions were answered to the patient's satisfaction.     Aviva Signs A

## 2014-10-16 NOTE — Transfer of Care (Signed)
Immediate Anesthesia Transfer of Care Note  Patient: Aaron Fox  Procedure(s) Performed: Procedure(s): RIGHT INGUINAL LYMPH NODE BIOPSY (Right)  Patient Location: PACU  Anesthesia Type:General  Level of Consciousness: awake, oriented and patient cooperative  Airway & Oxygen Therapy: Patient Spontanous Breathing and Patient connected to face mask oxygen  Post-op Assessment: Report given to RN and Post -op Vital signs reviewed and stable  Post vital signs: Reviewed and stable  Last Vitals:  Filed Vitals:   10/16/14 0930  BP: 128/76  Pulse:   Temp:   Resp: 14    Complications: No apparent anesthesia complications

## 2014-10-16 NOTE — Anesthesia Postprocedure Evaluation (Signed)
  Anesthesia Post-op Note  Patient: Aaron Fox  Procedure(s) Performed: Procedure(s): RIGHT INGUINAL LYMPH NODE BIOPSY (Right)  Patient Location: PACU  Anesthesia Type:General  Level of Consciousness: awake, oriented and patient cooperative  Airway and Oxygen Therapy: Patient Spontanous Breathing and Patient connected to face mask oxygen  Post-op Pain: none  Post-op Assessment: Post-op Vital signs reviewed, Patient's Cardiovascular Status Stable, Respiratory Function Stable, Patent Airway, No signs of Nausea or vomiting and Pain level controlled              Post-op Vital Signs: Reviewed and stable  Last Vitals:  Filed Vitals:   10/16/14 0930  BP: 128/76  Pulse:   Temp:   Resp: 14    Complications: No apparent anesthesia complications

## 2014-10-16 NOTE — Discharge Instructions (Signed)
Swollen Lymph Nodes The lymphatic system filters fluid from around cells. It is like a system of blood vessels. These channels carry lymph instead of blood. The lymphatic system is an important part of the immune (disease fighting) system. When people talk about "swollen glands in the neck," they are usually talking about swollen lymph nodes. The lymph nodes are like the little traps for infection. You and your caregiver may be able to feel lymph nodes, especially swollen nodes, in these common areas: the groin (inguinal area), armpits (axilla), and above the clavicle (supraclavicular). You may also feel them in the neck (cervical) and the back of the head just above the hairline (occipital). Swollen glands occur when there is any condition in which the body responds with an allergic type of reaction. For instance, the glands in the neck can become swollen from insect bites or any type of minor infection on the head. These are very noticeable in children with only minor problems. Lymph nodes may also become swollen when there is a tumor or problem with the lymphatic system, such as Hodgkin's disease. TREATMENT   Most swollen glands do not require treatment. They can be observed (watched) for a short period of time, if your caregiver feels it is necessary. Most of the time, observation is not necessary.  Antibiotics (medicines that kill germs) may be prescribed by your caregiver. Your caregiver may prescribe these if he or she feels the swollen glands are due to a bacterial (germ) infection. Antibiotics are not used if the swollen glands are caused by a virus. HOME CARE INSTRUCTIONS   Take medications as directed by your caregiver. Only take over-the-counter or prescription medicines for pain, discomfort, or fever as directed by your caregiver. SEEK MEDICAL CARE IF:   If you begin to run a temperature greater than 102 F (38.9 C), or as your caregiver suggests. MAKE SURE YOU:   Understand these  instructions.  Will watch your condition.  Will get help right away if you are not doing well or get worse. Document Released: 02/03/2002 Document Revised: 05/08/2011 Document Reviewed: 02/13/2005 [facility] Patient Information 2015 [place], [place]. This information is not intended to replace advice given to you by your health care provider. Make sure you discuss any questions you have with your health care provider.  Biopsy Care After Refer to this sheet in the next few weeks. These instructions provide you with information on caring for yourself after your procedure. Your caregiver may also give you more specific instructions. Your treatment has been planned according to current medical practices, but problems sometimes occur. Call your caregiver if you have any problems or questions after your procedure. If you had a fine needle biopsy, you may have soreness at the biopsy site for 1 to 2 days. If you had an open biopsy, you may have soreness at the biopsy site for 3 to 4 days. HOME CARE INSTRUCTIONS   You may resume normal diet and activities as directed.  Change bandages (dressings) as directed. If your wound was closed with a skin glue (adhesive), it will wear off and begin to peel in 7 days.  Only take over-the-counter or prescription medicines for pain, discomfort, or fever as directed by your caregiver.  Ask your caregiver when you can bathe and get your wound wet. SEEK IMMEDIATE MEDICAL CARE IF:   You have increased bleeding (more than a small spot) from the biopsy site.  You notice redness, swelling, or increasing pain at the biopsy site.  You have  pus coming from the biopsy site.  You have a fever.  You notice a bad smell coming from the biopsy site or dressing.  You have a rash, have difficulty breathing, or have any allergic problems. MAKE SURE YOU:   Understand these instructions.  Will watch your condition.  Will get help right away if you are not doing well or  get worse. Document Released: 09/02/2004 Document Revised: 05/08/2011 Document Reviewed: 08/11/2010 [facility] Patient Information 2015 [place], [place]. This information is not intended to replace advice given to you by your health care provider. Make sure you discuss any questions you have with your health care provider.

## 2014-10-19 ENCOUNTER — Encounter (HOSPITAL_COMMUNITY): Payer: Self-pay | Admitting: General Surgery

## 2014-10-20 ENCOUNTER — Ambulatory Visit (INDEPENDENT_AMBULATORY_CARE_PROVIDER_SITE_OTHER): Payer: 59 | Admitting: Endocrinology

## 2014-10-20 ENCOUNTER — Encounter: Payer: Self-pay | Admitting: Endocrinology

## 2014-10-20 VITALS — BP 136/82 | HR 82 | Temp 97.9°F | Ht 70.75 in | Wt 226.0 lb

## 2014-10-20 DIAGNOSIS — E291 Testicular hypofunction: Secondary | ICD-10-CM

## 2014-10-20 DIAGNOSIS — Z0000 Encounter for general adult medical examination without abnormal findings: Secondary | ICD-10-CM | POA: Diagnosis not present

## 2014-10-20 DIAGNOSIS — E119 Type 2 diabetes mellitus without complications: Secondary | ICD-10-CM

## 2014-10-20 LAB — POCT GLYCOSYLATED HEMOGLOBIN (HGB A1C): Hemoglobin A1C: 8.4

## 2014-10-20 MED ORDER — INSULIN REGULAR HUMAN 100 UNIT/ML IJ SOLN: 7.0000 [IU] | Freq: Every day | INTRAMUSCULAR | Status: DC

## 2014-10-20 MED ORDER — ATORVASTATIN CALCIUM 10 MG PO TABS: 10.0000 mg | ORAL_TABLET | Freq: Every day | ORAL | Status: DC

## 2014-10-20 NOTE — Progress Notes (Signed)
Subjective:    Patient ID: Aaron Fox, male    DOB: Oct 25, 1955, 59 y.o.   MRN: [id number]  HPI Pt is here for regular wellness examination, and is feeling pretty well in general, and says chronic med probs are stable, except as noted below Past Medical History  Diagnosis Date  . DIABETES MELLITUS, TYPE II 09/22/2006  . HYPOGONADISM, MALE 01/15/2007  . HYPERCHOLESTEROLEMIA 07/02/2008  . GOUT 09/22/2006  . HYPERTENSION 05/31/2009  . CHF 06/19/2008  . ALLERGIC RHINITIS 07/02/2008  . 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 Rheumatoid arthritis(714.0) 09/22/2006  . COUGH DUE TO ACE INHIBITORS 10/05/2008  . Atrial fibrillation   . ED (erectile dysfunction)   . PUD (peptic ulcer disease)   . Hypokalemia   . Lymphadenopathy   . Glaucoma   . Peripheral neuropathy     Past Surgical History  Procedure Laterality Date  . Lymph node biopsy    . Cataract extraction, bilateral    . Colonoscopy  2004  . Lymph node biopsy Right 10/16/2014    Procedure: RIGHT INGUINAL LYMPH NODE BIOPSY;  Surgeon: Aviva Signs, MD;  Location: [facility];  Service: General;  Laterality: Right;    Social History   Social History  . Marital Status: Married    Spouse Name: N/A  . Number of Children: 3  . Years of Education: N/A   Occupational History  . Diability    Social History Main Topics  . Smoking status: Former Smoker    Quit date: 11/02/1999  . Smokeless tobacco: Never Used  . Alcohol Use: No  . Drug Use: No  . Sexual Activity: Not on file   Other Topics Concern  . Not on file   Social History Narrative   Regular exercise-yes    Current Outpatient Prescriptions on File Prior to Visit  Medication Sig Dispense Refill  . aspirin 81 MG tablet Take 81 mg by mouth daily.      Marland Kitchen gabapentin (NEURONTIN) 300 MG capsule Take 300 mg by mouth at bedtime.    Marland Kitchen glucose blood (ONE TOUCH ULTRA TEST) test strip 1 each by Other route 2 (two) times daily. And lancets 2/day 250.03 100 each 12  . insulin NPH  Human (HUMULIN N,NOVOLIN N) 100 UNIT/ML injection Inject 60 Units into the skin every morning.     . Insulin Syringe-Needle U-100 (B-D INSULIN SYRINGE 2CC/27.5G) 27.5G X 5/8" 2 ML MISC Use as directed once daily     . oxyCODONE-acetaminophen (PERCOCET) 7.5-325 MG per tablet Take 1-2 tablets by mouth every 4 (four) hours as needed. (Patient not taking: Reported on 10/20/2014) 50 tablet 0   No current facility-administered medications on file prior to visit.    Allergies  Allergen Reactions  . Penicillins Swelling    Swelling of the hands   . Terbinafine Hcl Hives  . Zestril [Lisinopril] Cough    Family History  Problem Relation Age of Onset  . Cancer Neg Hx   . Colon cancer Neg Hx   . Heart disease Other     FH of CAD  . Colon polyps Paternal Uncle   . Kidney disease Paternal Uncle   . Gallbladder disease Neg Hx   . Esophageal cancer Neg Hx   . Diabetes Paternal Grandfather   . Diabetes Maternal Uncle     BP 136/82 mmHg  Pulse 82  Temp(Src) 97.9 F (36.6 C) (Oral)  Ht 5' 10.75" (1.797 m)  Wt 226 lb (102.513 kg)  BMI 31.75 kg/m2  SpO2 97%   Review of Systems  Constitutional: Negative for fever.  HENT: Negative for hearing loss.   Eyes: Negative for visual disturbance.  Respiratory: Negative for shortness of breath.   Cardiovascular: Negative for chest pain.  Gastrointestinal: Negative for anal bleeding.  Endocrine: Negative for cold intolerance.  Genitourinary: Negative for hematuria and difficulty urinating.  Musculoskeletal: Negative for gait problem.  Skin: Negative for rash.  Allergic/Immunologic: Negative for environmental allergies.  Neurological: Negative for headaches.  Hematological: Does not bruise/bleed easily.  Psychiatric/Behavioral: Negative for dysphoric mood.       Objective:   Physical Exam VS: see vs page GEN: no distress HEAD: head: no deformity eyes: no periorbital swelling, no proptosis external nose and ears are normal mouth: no  lesion seen NECK: supple, thyroid is not enlarged CHEST WALL: no deformity LUNGS: clear to auscultation BREASTS:  No gynecomastia CV: reg rate and rhythm, no murmur ABD: abdomen is soft, nontender.  no hepatosplenomegaly.  not distended.  no hernia GENITALIA/RECTAL/PROSTATE: sees urology MUSCULOSKELETAL: muscle bulk and strength are grossly normal.  no obvious joint swelling.  gait is normal and steady EXTEMITIES: no deformity.  no ulcer on the feet.  feet are of normal color and temp.  no edema PULSES: dorsalis pedis intact bilat.  no carotid bruit NEURO:  cn 2-12 grossly intact.   readily moves all 4's.  sensation is intact to touch on the feet SKIN:  Normal texture and temperature.  No rash or suspicious lesion is visible.   NODES:  None palpable at the neck PSYCH: alert, well-oriented.  Does not appear anxious nor depressed.      Assessment & Plan:  Wellness visit today, with problems stable, except as noted.   SEPARATE EVALUATION FOLLOWS--EACH PROBLEM HERE IS NEW, NOT RESPONDING TO TREATMENT, OR POSES SIGNIFICANT RISK TO THE PATIENT'S HEALTH: HISTORY OF THE PRESENT ILLNESS: Pt returns for f/u of diabetes mellitus: DM type: 1 Dx'ed: 3491 Complications: sensory neuropathy, retinopathy and nephropathy Therapy: insulin since 2003 DKA: never Severe hypoglycemia: never Pancreatitis: never Other: he can only afford human insulin; he has done better with a simple qd insulin regimen. Interval history:  no cbg record, but states cbg's are well-controlled, except it is still mildly low at lunch approx once per week.  It is highest at hs.  He does not take lipitor PAST MEDICAL HISTORY reviewed and up to date today REVIEW OF SYSTEMS: Denies LOC and weight change PHYSICAL EXAMINATION: VITAL SIGNS:  See vs page GENERAL: no distress Pulses: dorsalis pedis intact bilat.   MSK: no deformity of the feet, except for OA changes CV: trace bilat leg edema  Skin: no ulcer on the feet.  normal color and temp on the feet.  Neuro: sensation is intact to touch on the feet, but severely decreased from normal  Ext: There is bilateral onychomycosis of the toenails.  LAB/XRAY RESULTS: A1c=8.4% IMPRESSION: DM: glycemic control is worse Dyslipidemia: therapy limited by noncompliance.  i'll do the best i can. PLAN:  Please reduce the NPH to 60 units each morning, and: Add regular, 7 units with the evening meal.  i have sent a prescription to your pharmacy.   i have also sent a prescription to your pharmacy, to resume the lipitor.

## 2014-10-20 NOTE — Patient Instructions (Addendum)
Please reduce the NPH to 60 units each morning, and: Add regular, 7 units with the evening meal.  i have sent a prescription to your pharmacy.   i have also sent a prescription to your pharmacy, to resume the lipitor.  check your blood sugar twice a day.  vary the time of day when you check, between before the 3 meals, and at bedtime.  also check if you have symptoms of your  blood sugar being too high or too low.  please keep a record of the readings and bring it to your next appointment here.  You can write it on any piece of paper.   please call us sooner if your blood sugar goes below 70, or if you have a lot of readings over 200.   please consider these measures for your health:  minimize alcohol.  do not use tobacco products.  have a colonoscopy at least every 10 years from age 85.  keep  firearms safely stored.  always use seat belts.  have working smoke alarms in your home.  see an eye doctor and dentist regularly.  never drive under the influence  of alcohol or drugs (including prescription drugs).   Please come back for a follow-up appointment in 3 months.

## 2014-10-28 ENCOUNTER — Telehealth: Payer: Self-pay

## 2014-10-28 NOTE — Telephone Encounter (Signed)
Pt will have B/P rechecked on upcoming f/u visit 01/26/2015

## 2014-11-03 ENCOUNTER — Encounter (HOSPITAL_COMMUNITY): Payer: Self-pay | Admitting: Oncology

## 2014-11-03 ENCOUNTER — Encounter (HOSPITAL_COMMUNITY): Payer: 59 | Attending: Hematology & Oncology | Admitting: Oncology

## 2014-11-03 VITALS — BP 140/76 | HR 88 | Temp 98.5°F | Resp 14 | Wt 227.2 lb

## 2014-11-03 DIAGNOSIS — R590 Localized enlarged lymph nodes: Secondary | ICD-10-CM | POA: Diagnosis not present

## 2014-11-03 NOTE — Patient Instructions (Signed)
[place] at [facility] Discharge Instructions  RECOMMENDATIONS MADE BY THE CONSULTANT AND ANY TEST RESULTS WILL BE SENT TO YOUR REFERRING PHYSICIAN.  Exam and discussion by Robynn Pane, PA-C Your biopsy was negative for a malignancy. Dr. Whitney Muse will be making a referral for evaluation and you will be contacted with date, time and location of appointment  Follow-up in 8 weeks to see Dr. Whitney Muse.  Thank you for choosing [place] at [facility] to provide your oncology and hematology care.  To afford each patient quality time with our provider, please arrive at least 15 minutes before your scheduled appointment time.    You need to re-schedule your appointment should you arrive 10 or more minutes late.  We strive to give you quality time with our providers, and arriving late affects you and other patients whose appointments are after yours.  Also, if you no show three or more times for appointments you may be dismissed from the clinic at the providers discretion.     Again, thank you for choosing [facility].  Our hope is that these requests will decrease the amount of time that you wait before being seen by our physicians.       _____________________________________________________________  Should you have questions after your visit to [facility], please contact our office at [phone] between the hours of 8:30 a.m. and 4:30 p.m.  Voicemails left after 4:30 p.m. will not be returned until the following business day.  For prescription refill requests, have your pharmacy contact our office.

## 2014-11-03 NOTE — Progress Notes (Signed)
Renato Shin, MD [address] [place] [address]  Lymphadenopathy, retroperitoneal  CURRENT THERAPY: Referral to appropriate clinic.  INTERVAL HISTORY: Aaron Fox 59 y.o. male returns for followup of lymphadenopathy.  I personally reviewed and went over pathology results with the patient.  Pathology is negative for malignancy.  Results demonstrate a granulomatous lymphadenitis.    I reviewed these results in detail.    All questions are answered.    He denies any B symptoms.   We will need to see where he will be best served for follow-up of this issue as it is not oncologic in nature, yet requires follow-up moving forward.  Past Medical History  Diagnosis Date  . DIABETES MELLITUS, TYPE II 09/22/2006  . HYPOGONADISM, MALE 01/15/2007  . HYPERCHOLESTEROLEMIA 07/02/2008  . GOUT 09/22/2006  . HYPERTENSION 05/31/2009  . CHF 06/19/2008  . ALLERGIC RHINITIS 07/02/2008  . 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 Rheumatoid arthritis(714.0) 09/22/2006  . COUGH DUE TO ACE INHIBITORS 10/05/2008  . Atrial fibrillation   . ED (erectile dysfunction)   . PUD (peptic ulcer disease)   . Hypokalemia   . Lymphadenopathy   . Glaucoma   . Peripheral neuropathy     has DIABETES MELLITUS, TYPE II; Hypogonadism male; HYPERCHOLESTEROLEMIA; Gout; HYPOKALEMIA; Essential hypertension; CHF; ALLERGIC RHINITIS; MICROSCOPIC HEMATURIA; BALANITIS; IMPOTENCE OF ORGANIC ORIGIN; RHEUMATOID ARTHRITIS; LEG PAIN, BILATERAL; Encounter for long-term (current) use of other medications; Disturbance of skin sensation; Screening for prostate cancer; Inflammatory and toxic neuropathy, unspecified; Shoulder pain, right; Numbness and tingling in hands; Dyspnea; Arthralgia; Routine general medical examination at a health care facility; Piriformis syndrome of left side; Type 1 diabetes mellitus with neurological manifestations, uncontrolled; Diabetic foot ulcer; Wellness examination; PSA  elevation; Iron deficiency anemia; and Lymphadenopathy, retroperitoneal on his problem list.     is allergic to penicillins; terbinafine hcl; and zestril.  Current Outpatient Prescriptions on File Prior to Visit  Medication Sig Dispense Refill  . aspirin 81 MG tablet Take 81 mg by mouth daily.      Marland Kitchen atorvastatin (LIPITOR) 10 MG tablet Take 1 tablet (10 mg total) by mouth daily. 90 tablet 3  . gabapentin (NEURONTIN) 300 MG capsule Take 300 mg by mouth at bedtime.    Marland Kitchen glucose blood (ONE TOUCH ULTRA TEST) test strip 1 each by Other route 2 (two) times daily. And lancets 2/day 250.03 100 each 12  . insulin NPH Human (HUMULIN N,NOVOLIN N) 100 UNIT/ML injection Inject 60 Units into the skin every morning.     . Insulin Syringe-Needle U-100 (B-D INSULIN SYRINGE 2CC/27.5G) 27.5G X 5/8" 2 ML MISC Use as directed once daily     . insulin regular (NOVOLIN R RELION) 100 units/mL injection Inject 0.07 mLs (7 Units total) into the skin daily with supper. And pen needles 2/day 10 mL 11  . oxyCODONE-acetaminophen (PERCOCET) 7.5-325 MG per tablet Take 1-2 tablets by mouth every 4 (four) hours as needed. (Patient not taking: Reported on 10/20/2014) 50 tablet 0   No current facility-administered medications on file prior to visit.    Past Surgical History  Procedure Laterality Date  . Lymph node biopsy    . Cataract extraction, bilateral    . Colonoscopy  2004  . Lymph node biopsy Right 10/16/2014    Procedure: RIGHT INGUINAL LYMPH NODE BIOPSY;  Surgeon: Aviva Signs, MD;  Location: [facility];  Service: General;  Laterality: Right;    Denies any headaches, dizziness, double  vision, fevers, chills, night sweats, nausea, vomiting, diarrhea, constipation, chest pain, heart palpitations, shortness of breath, blood in stool, black tarry stool, urinary pain, urinary burning, urinary frequency, hematuria.   PHYSICAL EXAMINATION  ECOG PERFORMANCE STATUS: 0 - Asymptomatic  Filed Vitals:   11/03/14 1016  BP:  140/76  Pulse: 88  Temp: 98.5 F (36.9 C)  Resp: 14    GENERAL:alert, healthy, no distress, well nourished, well developed, comfortable, cooperative, smiling and accompanied by his wife Inez Catalina. SKIN: skin color, texture, turgor are normal, no rashes or significant lesions HEAD: Normocephalic, No masses, lesions, tenderness or abnormalities EYES: normal, PERRLA, EOMI, Conjunctiva are pink and non-injected EARS: External ears normal OROPHARYNX:lips, buccal mucosa, and tongue normal and mucous membranes are moist  NECK: supple, trachea midline LYMPH:  no palpable lymphadenopathy BREAST:not examined LUNGS: clear to auscultation and percussion HEART: regular rate & rhythm, no murmurs, no gallops, S1 normal and S2 normal ABDOMEN:abdomen soft, non-tender and normal bowel sounds BACK: Back symmetric, no curvature., No CVA tenderness EXTREMITIES:less then 2 second capillary refill, no joint deformities, effusion, or inflammation, no skin discoloration, no clubbing, no cyanosis  NEURO: alert & oriented x 3 with fluent speech, no focal motor/sensory deficits, gait normal    LABORATORY DATA: CBC    Component Value Date/Time   WBC 4.4 10/14/2014 1530   WBC 4.2 12/29/2008 0849   RBC 3.92* 10/14/2014 1530   RBC 4.75 12/29/2008 0849   HGB 10.9* 10/14/2014 1530   HGB 13.8 12/29/2008 0849   HCT 31.5* 10/14/2014 1530   HCT 39.6 12/29/2008 0849   PLT 214 10/14/2014 1530   PLT 205 12/29/2008 0849   MCV 80.4 10/14/2014 1530   MCV 83.3 12/29/2008 0849   MCH 27.8 10/14/2014 1530   MCH 29.1 12/29/2008 0849   MCHC 34.6 10/14/2014 1530   MCHC 34.9 12/29/2008 0849   RDW 13.3 10/14/2014 1530   RDW 13.1 12/29/2008 0849   LYMPHSABS 2.2 09/17/2014 1625   LYMPHSABS 1.8 12/29/2008 0849   MONOABS 0.5 09/17/2014 1625   MONOABS 0.5 12/29/2008 0849   EOSABS 0.4 09/17/2014 1625   EOSABS 0.2 12/29/2008 0849   BASOSABS 0.1 09/17/2014 1625   BASOSABS 0.0 12/29/2008 0849      Chemistry      Component  Value Date/Time   NA 136 10/14/2014 1530   K 4.0 10/14/2014 1530   CL 105 10/14/2014 1530   CO2 21* 10/14/2014 1530   BUN 21* 10/14/2014 1530   CREATININE 1.24 10/14/2014 1530      Component Value Date/Time   CALCIUM 9.0 10/14/2014 1530   ALKPHOS 101 09/17/2014 1625   AST 23 09/17/2014 1625   ALT 18 09/17/2014 1625   BILITOT 0.5 09/17/2014 1625        PENDING LABS:   RADIOGRAPHIC STUDIES:  No results found.   PATHOLOGY:  10/16/2014  Diagnosis Lymph node for lymphoma, right inguinal - GRANULOMATOUS LYMPHADENITIS. - DERMATOPATHIC CHANGE. - NO LYMPHOMA OR CARCINOMA IDENTIFIED. Microscopic Comment Sections of lymph node reveal scattered granulomas. Some of the large granulomas exhibit focal necrosis. There is no significant neutrophilic component. There are also pigmented macrophages. Scattered reactive follicles are present. GMS and PAS are negative for fungal organisms. There is no monoclonal B-cell or phenotypically aberrant T-cell population by flow cytometry ([id number]) AFB is negative for acid fast organisms. 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 Vicente Males MD Pathologist, Electronic Signature (Case signed 10/20/2014)    ASSESSMENT AND PLAN:  Lymphadenopathy, retroperitoneal Abdominal and pelvic  adenopathy with a right inguinal node that is enlarged and hypermetabolic.  Previous biopsy has been negative for malignancy and instead demonstrated lymphoid hyperplasia.    S/P inguinal node biopsy (excisional) which was negative for malignancy but demonstrates granulomatous lymphadenitis.  No indication of malignancy.  We will make a few phone calls and refer the patient to the appropriate clinic who will follow this issue as it is non-oncologic in nature.    Return in 8 weeks for follow-up.  If all is in place, will consider releasing the patient from the clinic.   THERAPY PLAN:  Appropriate referral with future plans to  release the patient from the clinic.  All questions were answered. The patient knows to call the clinic with any problems, questions or concerns. We can certainly see the patient much sooner if necessary.  Patient and plan discussed with Dr. Ancil Linsey and she is in agreement with the aforementioned.   This note is electronically signed by: Robynn Pane, PA-C 11/03/2014 11:04 AM

## 2014-11-03 NOTE — Assessment & Plan Note (Signed)
Abdominal and pelvic adenopathy with a right inguinal node that is enlarged and hypermetabolic.  Previous biopsy has been negative for malignancy and instead demonstrated lymphoid hyperplasia.    S/P inguinal node biopsy (excisional) which was negative for malignancy but demonstrates granulomatous lymphadenitis.  No indication of malignancy.  We will make a few phone calls and refer the patient to the appropriate clinic who will follow this issue as it is non-oncologic in nature.    Return in 8 weeks for follow-up.  If all is in place, will consider releasing the patient from the clinic.

## 2014-11-09 ENCOUNTER — Other Ambulatory Visit (HOSPITAL_COMMUNITY): Payer: Self-pay | Admitting: Oncology

## 2014-11-09 DIAGNOSIS — R590 Localized enlarged lymph nodes: Secondary | ICD-10-CM

## 2014-11-10 ENCOUNTER — Encounter (HOSPITAL_COMMUNITY): Payer: Self-pay | Admitting: Lab

## 2014-11-10 NOTE — Progress Notes (Signed)
Referral to Dr Luan Pulling.  Records faxed on 9/13

## 2014-11-12 ENCOUNTER — Other Ambulatory Visit (HOSPITAL_COMMUNITY): Payer: 59

## 2014-11-23 ENCOUNTER — Encounter (HOSPITAL_COMMUNITY): Payer: Self-pay

## 2014-11-23 ENCOUNTER — Other Ambulatory Visit (HOSPITAL_COMMUNITY): Payer: Self-pay

## 2014-12-29 ENCOUNTER — Ambulatory Visit (HOSPITAL_COMMUNITY): Payer: 59 | Admitting: Hematology & Oncology

## 2015-01-26 ENCOUNTER — Ambulatory Visit: Payer: 59 | Admitting: Endocrinology

## 2015-04-01 ENCOUNTER — Ambulatory Visit (INDEPENDENT_AMBULATORY_CARE_PROVIDER_SITE_OTHER): Payer: Medicare HMO | Admitting: Endocrinology

## 2015-04-01 ENCOUNTER — Encounter: Payer: Self-pay | Admitting: Endocrinology

## 2015-04-01 VITALS — BP 146/84 | HR 93 | Temp 97.9°F | Ht 70.5 in | Wt 230.0 lb

## 2015-04-01 DIAGNOSIS — E1040 Type 1 diabetes mellitus with diabetic neuropathy, unspecified: Secondary | ICD-10-CM

## 2015-04-01 DIAGNOSIS — D509 Iron deficiency anemia, unspecified: Secondary | ICD-10-CM | POA: Diagnosis not present

## 2015-04-01 DIAGNOSIS — R0600 Dyspnea, unspecified: Secondary | ICD-10-CM

## 2015-04-01 DIAGNOSIS — Z23 Encounter for immunization: Secondary | ICD-10-CM | POA: Diagnosis not present

## 2015-04-01 DIAGNOSIS — E7800 Pure hypercholesterolemia, unspecified: Secondary | ICD-10-CM | POA: Diagnosis not present

## 2015-04-01 DIAGNOSIS — IMO0002 Reserved for concepts with insufficient information to code with codable children: Secondary | ICD-10-CM

## 2015-04-01 DIAGNOSIS — E1065 Type 1 diabetes mellitus with hyperglycemia: Secondary | ICD-10-CM | POA: Diagnosis not present

## 2015-04-01 LAB — CBC WITH DIFFERENTIAL/PLATELET
Basophils Absolute: 0 10*3/uL (ref 0.0–0.1)
Basophils Relative: 0.5 % (ref 0.0–3.0)
EOS PCT: 7.9 % — AB (ref 0.0–5.0)
Eosinophils Absolute: 0.6 10*3/uL (ref 0.0–0.7)
HEMATOCRIT: 39.1 % (ref 39.0–52.0)
HEMOGLOBIN: 13.1 g/dL (ref 13.0–17.0)
LYMPHS ABS: 2.9 10*3/uL (ref 0.7–4.0)
LYMPHS PCT: 36.6 % (ref 12.0–46.0)
MCHC: 33.5 g/dL (ref 30.0–36.0)
MCV: 82.3 fl (ref 78.0–100.0)
MONOS PCT: 13.5 % — AB (ref 3.0–12.0)
Monocytes Absolute: 1.1 10*3/uL — ABNORMAL HIGH (ref 0.1–1.0)
Neutro Abs: 3.2 10*3/uL (ref 1.4–7.7)
Neutrophils Relative %: 41.5 % — ABNORMAL LOW (ref 43.0–77.0)
Platelets: 262 10*3/uL (ref 150.0–400.0)
RBC: 4.76 Mil/uL (ref 4.22–5.81)
RDW: 14.1 % (ref 11.5–15.5)
WBC: 7.8 10*3/uL (ref 4.0–10.5)

## 2015-04-01 LAB — BASIC METABOLIC PANEL
BUN: 16 mg/dL (ref 6–23)
CHLORIDE: 107 meq/L (ref 96–112)
CO2: 25 meq/L (ref 19–32)
Calcium: 9.8 mg/dL (ref 8.4–10.5)
Creatinine, Ser: 1.06 mg/dL (ref 0.40–1.50)
GFR: 91.97 mL/min (ref >60.00)
GLUCOSE: 51 mg/dL — AB (ref 70–99)
POTASSIUM: 3.7 meq/L (ref 3.5–5.1)
SODIUM: 140 meq/L (ref 135–145)

## 2015-04-01 LAB — LIPID PANEL
CHOL/HDL RATIO: 4
Cholesterol: 144 mg/dL (ref 0–200)
HDL: 35.9 mg/dL — AB (ref >39.00)
LDL Cholesterol: 78 mg/dL (ref 0–99)
NONHDL: 107.69
TRIGLYCERIDES: 147 mg/dL (ref 0.0–149.0)
VLDL: 29.4 mg/dL (ref 0.0–40.0)

## 2015-04-01 LAB — IBC PANEL
Iron: 67 ug/dL (ref 42–165)
SATURATION RATIOS: 20.8 % (ref 20.0–50.0)
Transferrin: 230 mg/dL (ref 212.0–360.0)

## 2015-04-01 LAB — POCT GLYCOSYLATED HEMOGLOBIN (HGB A1C): Hemoglobin A1C: 11.2

## 2015-04-01 MED ORDER — INSULIN REGULAR HUMAN 100 UNIT/ML IJ SOLN: 7.0000 [IU] | Freq: Every day | INTRAMUSCULAR | Status: DC

## 2015-04-01 NOTE — Patient Instructions (Addendum)
Please take the NPH to 60 units each morning, and: take regular, 7 units with the evening meal.  check your blood sugar twice a day.  vary the time of day when you check, between before the 3 meals, and at bedtime.  also check if you have symptoms of your  blood sugar being too high or too low.  please keep a record of the readings and bring it to your next appointment here.  You can write it on any piece of paper.   please call us sooner if your blood sugar goes below 70, or if you have a lot of readings over 200.   Please see a heart specialist.  you will receive a phone call, about a day and time for an appointment. blood tests are requested for you today.  We'll let you know about the results.  Please come back for a follow-up appointment in 2 months.

## 2015-04-01 NOTE — Progress Notes (Signed)
Subjective:    Patient ID: Aaron Fox, male    DOB: 1955/06/06, 60 y.o.   MRN: [id number]  HPI Pt returns for f/u of diabetes mellitus: DM type: 1 Dx'ed: 123XX123 Complications: sensory neuropathy, retinopathy and nephropathy Therapy: insulin since 2003 DKA: never Severe hypoglycemia: never Pancreatitis: never Other: he can only afford human insulin; he has done better with a simple qd insulin regimen. Interval history: Pt says he does not miss the insulin.  He takes reg, 60 units qam and 10 units qpm.  He does not take NPH.  no cbg record, but states cbg's are frequently low at lunch.   He now takes lipitor as rx'ed. Past Medical History  Diagnosis Date  . DIABETES MELLITUS, TYPE II 09/22/2006  . HYPOGONADISM, MALE 01/15/2007  . HYPERCHOLESTEROLEMIA 07/02/2008  . GOUT 09/22/2006  . HYPERTENSION 05/31/2009  . CHF 06/19/2008  . ALLERGIC RHINITIS 07/02/2008  . 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 Rheumatoid arthritis(714.0) 09/22/2006  . COUGH DUE TO ACE INHIBITORS 10/05/2008  . Atrial fibrillation ([place])   . ED (erectile dysfunction)   . PUD (peptic ulcer disease)   . Hypokalemia   . Lymphadenopathy   . Glaucoma   . Peripheral neuropathy [facility])     Past Surgical History  Procedure Laterality Date  . Lymph node biopsy    . Cataract extraction, bilateral    . Colonoscopy  2004  . Lymph node biopsy Right 10/16/2014    Procedure: RIGHT INGUINAL LYMPH NODE BIOPSY;  Surgeon: Aviva Signs, MD;  Location: [facility];  Service: General;  Laterality: Right;    Social History   Social History  . Marital Status: Married    Spouse Name: N/A  . Number of Children: 3  . Years of Education: N/A   Occupational History  . Diability    Social History Main Topics  . Smoking status: Former Smoker    Quit date: 11/02/1999  . Smokeless tobacco: Never Used  . Alcohol Use: No  . Drug Use: No  . Sexual Activity: Not on file   Other Topics Concern  . Not on file   Social History  Narrative   Regular exercise-yes    Current Outpatient Prescriptions on File Prior to Visit  Medication Sig Dispense Refill  . aspirin 81 MG tablet Take 81 mg by mouth daily.      Marland Kitchen atorvastatin (LIPITOR) 10 MG tablet Take 1 tablet (10 mg total) by mouth daily. 90 tablet 3  . gabapentin (NEURONTIN) 300 MG capsule Take 300 mg by mouth at bedtime.    Marland Kitchen glucose blood (ONE TOUCH ULTRA TEST) test strip 1 each by Other route 2 (two) times daily. And lancets 2/day 250.03 100 each 12  . Insulin Syringe-Needle U-100 (B-D INSULIN SYRINGE 2CC/27.5G) 27.5G X 5/8" 2 ML MISC Use as directed once daily     . insulin NPH Human (HUMULIN N,NOVOLIN N) 100 UNIT/ML injection Inject 60 Units into the skin every morning. Reported on 04/01/2015     No current facility-administered medications on file prior to visit.    Allergies  Allergen Reactions  . Penicillins Swelling    Swelling of the hands   . Terbinafine Hcl Hives  . Zestril [Lisinopril] Cough    Family History  Problem Relation Age of Onset  . Cancer Neg Hx   . Colon cancer Neg Hx   . Heart disease Other     FH of CAD  . Colon polyps Paternal Uncle   . Kidney  disease Paternal Uncle   . Gallbladder disease Neg Hx   . Esophageal cancer Neg Hx   . Diabetes Paternal Grandfather   . Diabetes Maternal Uncle     BP 146/84 mmHg  Pulse 93  Temp(Src) 97.9 F (36.6 C) (Oral)  Ht 5' 10.5" (1.791 m)  Wt 230 lb (104.327 kg)  BMI 32.52 kg/m2  SpO2 93%  Review of Systems Denies LOC.  Doe persists, but no chest pain.    Objective:   Physical Exam VITAL SIGNS:  See vs page GENERAL: no distress Pulses: dorsalis pedis intact bilat.   MSK: no deformity of the feet, except for OA changes CV: trace bilat leg edema  Skin: no ulcer on the feet. normal color and temp on the feet.  Neuro: sensation is intact to touch on the feet, but severely decreased from normal  Ext: There is bilateral onychomycosis of the toenails.    A1c=11.2%      Assessment & Plan:  DM: worse DOE, persistent: he requests to see cardiol Dyslipidemia, back on rx.  We'll recheck with next labs  Patient is advised the following: Patient Instructions  Please take the NPH to 60 units each morning, and: take regular, 7 units with the evening meal.  check your blood sugar twice a day.  vary the time of day when you check, between before the 3 meals, and at bedtime.  also check if you have symptoms of your  blood sugar being too high or too low.  please keep a record of the readings and bring it to your next appointment here.  You can write it on any piece of paper.   please call us sooner if your blood sugar goes below 70, or if you have a lot of readings over 200.   Please see a heart specialist.  you will receive a phone call, about a day and time for an appointment. blood tests are requested for you today.  We'll let you know about the results.  Please come back for a follow-up appointment in 2 months.

## 2015-04-02 ENCOUNTER — Telehealth: Payer: Self-pay | Admitting: Endocrinology

## 2015-04-02 DIAGNOSIS — Z0000 Encounter for general adult medical examination without abnormal findings: Secondary | ICD-10-CM

## 2015-04-02 LAB — D-DIMER, QUANTITATIVE (NOT AT ARMC): D DIMER QUANT: 0.37 ug{FEU}/mL (ref 0.00–0.48)

## 2015-04-02 NOTE — Telephone Encounter (Signed)
See note below and please advise, Thanks! 

## 2015-04-02 NOTE — Telephone Encounter (Signed)
done

## 2015-04-02 NOTE — Addendum Note (Signed)
Addended by: Renato Shin on: 04/02/2015 03:40 PM   Modules accepted: Orders

## 2015-04-02 NOTE — Telephone Encounter (Signed)
Pt needs referral to get his colonoscopy please

## 2015-04-02 NOTE — Telephone Encounter (Signed)
Pt notified referral has been placed.  

## 2015-04-05 ENCOUNTER — Encounter: Payer: Self-pay | Admitting: Internal Medicine

## 2015-04-16 ENCOUNTER — Telehealth: Payer: Self-pay

## 2015-04-16 MED ORDER — BD SWAB SINGLE USE REGULAR PADS: MEDICATED_PAD | Status: DC

## 2015-04-16 MED ORDER — GLUCOSE BLOOD VI STRP: ORAL_STRIP | Status: DC

## 2015-04-16 MED ORDER — GABAPENTIN 300 MG PO CAPS: 300.0000 mg | ORAL_CAPSULE | Freq: Every day | ORAL | Status: DC

## 2015-04-16 MED ORDER — ACCU-CHEK NANO SMARTVIEW W/DEVICE KIT: PACK | Status: DC

## 2015-04-16 MED ORDER — ATORVASTATIN CALCIUM 10 MG PO TABS
10.0000 mg | ORAL_TABLET | Freq: Every day | ORAL | Status: DC
Start: 2015-04-16 — End: 2016-03-21

## 2015-04-16 MED ORDER — ACCU-CHEK FASTCLIX LANCETS MISC: Status: DC

## 2015-04-16 NOTE — Telephone Encounter (Signed)
i sent refill

## 2015-04-16 NOTE — Telephone Encounter (Signed)
Pt is requesting a refill on his gabapentin. Rx is listed under a historical provider.  Thanks!

## 2015-04-28 ENCOUNTER — Ambulatory Visit: Payer: Medicare Other | Admitting: Cardiovascular Disease

## 2015-05-03 ENCOUNTER — Encounter: Payer: Self-pay | Admitting: Cardiovascular Disease

## 2015-05-03 ENCOUNTER — Ambulatory Visit (INDEPENDENT_AMBULATORY_CARE_PROVIDER_SITE_OTHER): Payer: Commercial Managed Care - HMO | Admitting: Cardiovascular Disease

## 2015-05-03 VITALS — BP 190/96 | HR 70 | Ht 71.5 in | Wt 234.1 lb

## 2015-05-03 DIAGNOSIS — E1065 Type 1 diabetes mellitus with hyperglycemia: Secondary | ICD-10-CM | POA: Diagnosis not present

## 2015-05-03 DIAGNOSIS — E7800 Pure hypercholesterolemia, unspecified: Secondary | ICD-10-CM | POA: Diagnosis not present

## 2015-05-03 DIAGNOSIS — R0600 Dyspnea, unspecified: Secondary | ICD-10-CM

## 2015-05-03 DIAGNOSIS — E1042 Type 1 diabetes mellitus with diabetic polyneuropathy: Secondary | ICD-10-CM

## 2015-05-03 DIAGNOSIS — R0609 Other forms of dyspnea: Secondary | ICD-10-CM

## 2015-05-03 DIAGNOSIS — I10 Essential (primary) hypertension: Secondary | ICD-10-CM

## 2015-05-03 DIAGNOSIS — IMO0002 Reserved for concepts with insufficient information to code with codable children: Secondary | ICD-10-CM

## 2015-05-03 NOTE — Progress Notes (Signed)
Patient ID: Aaron Fox, male   DOB: [birth year], 60 y.o.   MRN: [id number]    Cardiology Office Note    Date:  05/03/2015   ID:  Aaron Fox, DOB 1955/11/05, MRN [id number]  PCP:  Renato Shin, MD  Cardiologist:   Sanda Klein, MD   No chief complaint on file.   History of Present Illness:  Aaron Fox is a 60 y.o. male with diabetes mellitus complicated by peripheral neuropathy, referred for evaluation of exertional dyspnea. For the most part he seems to describe class II symptoms (climbing the hill carrying 08), but occasionally he will describe dyspnea even with activities of daily living. 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 He does not have exertional chest pain. He denies palpitations, syncope, focal neurological deficits other than his lower extremity neuropathy, orthopnea, PND, cough/hemoptysis. he has occasional ankle edema towards the end of a long day, always resolved by the next morning.  He was evaluated in the emergency room for similar complaints of exertional dyspnea and edema in 2010 and had a normal nuclear perfusion study with LVEF 50%. The echo showed normal LVEF of 60-65 percent with evidence of grade 2 diastolic dysfunction and elevated pulmonary artery pressure at the 36-40 mmHg range. However the left atrium was not dilated and there was no left ventricular hypertrophy.   He reports that his blood pressure is usually normal, at most a systolic blood pressure has been recorded as high as 146. His electronic record indeed shows that most commonly his blood pressure is in the 130s or low 140s, only one significantly elevated in the past in July 2016. His blood pressure today is markedly elevated at 190/90 mmHg, even after he rested for several minutes. While he has gained about 30 pounds in the last 7 years, over the last 12 months his weight has been quite steady in the 225-235  range. DM control is poor with a hemoglobin A1c of 11%. Renal parameters are normal. Lipid profile is significant for a relatively low HDL cholesterol but other parameters are within desirable range.  Several years ago he had an intensive evaluation for lymphadenopathy including CT scans and PET scans and biopsy of 2 lymph nodes that showed granulomatous lymphadenitis. I don't think a firm diagnosis was ever established.  Past Medical History  Diagnosis Date  . DIABETES MELLITUS, TYPE II 09/22/2006  . HYPOGONADISM, MALE 01/15/2007  . HYPERCHOLESTEROLEMIA 07/02/2008  . GOUT 09/22/2006  . HYPERTENSION 05/31/2009  . CHF 06/19/2008  . ALLERGIC RHINITIS 07/02/2008  . 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 Rheumatoid arthritis(714.0) 09/22/2006  . COUGH DUE TO ACE INHIBITORS 10/05/2008  . Atrial fibrillation ([place])   . ED (erectile dysfunction)   . PUD (peptic ulcer disease)   . Hypokalemia   . Lymphadenopathy   . Glaucoma   . Peripheral neuropathy [facility])     Past Surgical History  Procedure Laterality Date  . Lymph node biopsy    . Cataract extraction, bilateral    . Colonoscopy  2004  . Lymph node biopsy Right 10/16/2014    Procedure: RIGHT INGUINAL LYMPH NODE BIOPSY;  Surgeon: Aviva Signs, MD;  Location: [facility];  Service: General;  Laterality: Right;    Outpatient Prescriptions Prior to Visit  Medication Sig Dispense Refill  . ACCU-CHEK FASTCLIX LANCETS MISC Use to check blood sugar 2 times per day. 204 each 2  . Alcohol Swabs (B-D SINGLE  USE SWABS REGULAR) PADS Use to check blood sugar two times per day. 200 each 2  . aspirin 81 MG tablet Take 81 mg by mouth daily.      Marland Kitchen atorvastatin (LIPITOR) 10 MG tablet Take 1 tablet (10 mg total) by mouth daily. 90 tablet 3  . Blood Glucose Monitoring Suppl (ACCU-CHEK NANO SMARTVIEW) w/Device KIT Use to check blood sugar 2 times per day. 1 kit 2  . gabapentin (NEURONTIN) 300 MG capsule Take 1 capsule (300 mg total) by mouth at bedtime. 90 capsule 3    . glucose blood (ACCU-CHEK SMARTVIEW) test strip Use to check blood sugar times per day. 200 each 2  . insulin NPH Human (HUMULIN N,NOVOLIN N) 100 UNIT/ML injection Inject 60 Units into the skin every morning. Reported on 04/01/2015    . insulin regular (NOVOLIN R RELION) 100 units/mL injection Inject 0.07 mLs (7 Units total) into the skin daily with supper. And pen needles 2/day 10 mL 11  . Insulin Syringe-Needle U-100 (B-D INSULIN SYRINGE 2CC/27.5G) 27.5G X 5/8" 2 ML MISC Use as directed once daily      No facility-administered medications prior to visit.     Allergies:   Penicillins; Terbinafine hcl; and Zestril   Social History   Social History  . Marital Status: Married    Spouse Name: N/A  . Number of Children: 3  . Years of Education: N/A   Occupational History  . Diability    Social History Main Topics  . Smoking status: Former Smoker    Quit date: 11/02/1999  . Smokeless tobacco: Never Used  . Alcohol Use: No  . Drug Use: No  . Sexual Activity: Not on file   Other Topics Concern  . Not on file   Social History Narrative   Regular exercise-yes     Family History:  The patient's family history includes Colon polyps in his paternal uncle; Diabetes in his maternal uncle and paternal grandfather; Heart disease in his other; Kidney disease in his paternal uncle. There is no history of Cancer, Colon cancer, Gallbladder disease, or Esophageal cancer.   ROS:   Please see the history of present illness.    ROS All other systems reviewed and are negative.   PHYSICAL EXAM:   VS:  There were no vitals taken for this visit.   GEN: Well nourished, well developed, in no acute distress HEENT: normal Neck: no JVD, carotid bruits, or masses Cardiac: RRR; no murmurs, rubs, or gallops,no edema  Respiratory:  clear to auscultation bilaterally, normal work of breathing GI: soft, nontender, nondistended, + BS MS: no deformity or atrophy Skin: warm and dry, no rash Neuro:  Alert  and Oriented x 3, Strength and sensation are intact Psych: euthymic mood, full affect  Wt Readings from Last 3 Encounters:  04/01/15 104.327 kg (230 lb)  11/03/14 103.057 kg (227 lb 3.2 oz)  10/20/14 102.513 kg (226 lb)      Studies/Labs Reviewed:   EKG:  EKG is ordered today.  The ekg ordered today demonstratesnormal sinus rhythm, normal tracing except slightly flattened T waves in leads 1 and aVL   Labs: 05/21/2014: TSH 0.99 09/17/2014: ALT 18 04/01/2015: BUN 16; Creatinine, Ser 1.06; Hemoglobin 13.1; Platelets 262.0; Potassium 3.7; Sodium 140   Lipid Panel    Component Value Date/Time   CHOL 144 04/01/2015 0820   TRIG 147.0 04/01/2015 0820   HDL 35.90* 04/01/2015 0820   CHOLHDL 4 04/01/2015 0820   VLDL 29.4 04/01/2015 0820  [place] 78 04/01/2015 0820   LDLDIRECT 120.2 04/04/2013 0918    Additional studies/ records that were reviewed today include:  PCP records, multiple office and hospital records from 2010-2012   ASSESSMENT:    1. Dyspnea   2. 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 HYPERCHOLESTEROLEMIA   4. Essential hypertension      PLAN:  In order of problems listed above:  1. Dyspnea: He has a normal physical exam and electrocardiogram, but had a borderline left ventricular systolic function by a nuclear stress test performed in 2010. He does not have angina pectoris. The concern is for an anginal equivalent and I would repeat a treadmill stress test as well as an echocardiogram. If left ventricular systolic function is abnormal I would pursue this further with cardiac catheterization, whether or not there is clear evidence of ischemia. If his cardiac workup is normal, consider pulmonary function tests and CT chest, since his previous granulomatous lymphadenitis may have been an early manifestation of sarcoidosis. Note recent erythrocyte sedimentation rate was quite high.  2. DM: Aggressive diabetes control should be pursued    3. HLP: Lipid profile is fair, perfectly acceptable if she does not have vascular disease. His coronary problems are identified with increase statin dose to achieve LDL under 70  4. HTN: The current degree of elevation in blood pressure is quite unexpected compared to his previous strands. No changes are made to his medications today. However, I told him that since he has diabetes mellitus his target blood pressure should be lower than for the average population and I would probably advocate treatment with an angiotensin receptor blocker. We'll wait until we have the results of his cardiac workup     Medication Adjustments/Labs and Tests Ordered: Current medicines are reviewed at length with the patient today.  Concerns regarding medicines are outlined above.  Medication changes, Labs and Tests ordered today are listed in the Patient Instructions below. There are no Patient Instructions on file for this visit.     Mikael Spray, MD  05/03/2015 8:13 AM    [facility] [place], [place], [place]  [address] Phone: [phone]; Fax: [phone]

## 2015-05-03 NOTE — Patient Instructions (Signed)
Your physician has requested that you have an echocardiogram. Echocardiography is a painless test that uses sound waves to create images of your heart. It provides your doctor with information about the size and shape of your heart and how well your heart's chambers and valves are working. This procedure takes approximately one hour. There are no restrictions for this procedure.  Your physician has requested that you have an exercise tolerance test. For further information please visit [email or web address]. Please also follow instruction sheet, as given.  Dr. Sallyanne Kuster recommends that you schedule a follow-up appointment in: [place]

## 2015-05-10 ENCOUNTER — Ambulatory Visit: Payer: Medicare Other | Admitting: Cardiovascular Disease

## 2015-05-17 ENCOUNTER — Other Ambulatory Visit: Payer: Self-pay

## 2015-05-17 ENCOUNTER — Ambulatory Visit (HOSPITAL_COMMUNITY): Payer: Commercial Managed Care - HMO | Attending: Cardiology

## 2015-05-17 DIAGNOSIS — Z87891 Personal history of nicotine dependence: Secondary | ICD-10-CM | POA: Diagnosis not present

## 2015-05-17 DIAGNOSIS — I340 Nonrheumatic mitral (valve) insufficiency: Secondary | ICD-10-CM | POA: Diagnosis not present

## 2015-05-17 DIAGNOSIS — I313 Pericardial effusion (noninflammatory): Secondary | ICD-10-CM | POA: Diagnosis not present

## 2015-05-17 DIAGNOSIS — I071 Rheumatic tricuspid insufficiency: Secondary | ICD-10-CM | POA: Diagnosis not present

## 2015-05-17 DIAGNOSIS — E119 Type 2 diabetes mellitus without complications: Secondary | ICD-10-CM | POA: Insufficient documentation

## 2015-05-17 DIAGNOSIS — I119 Hypertensive heart disease without heart failure: Secondary | ICD-10-CM | POA: Diagnosis not present

## 2015-05-17 DIAGNOSIS — E785 Hyperlipidemia, unspecified: Secondary | ICD-10-CM | POA: Diagnosis not present

## 2015-05-17 DIAGNOSIS — R0600 Dyspnea, unspecified: Secondary | ICD-10-CM | POA: Insufficient documentation

## 2015-05-19 ENCOUNTER — Telehealth (HOSPITAL_COMMUNITY): Payer: Self-pay

## 2015-05-19 NOTE — Telephone Encounter (Signed)
Encounter complete. 

## 2015-05-20 ENCOUNTER — Ambulatory Visit (AMBULATORY_SURGERY_CENTER): Payer: Self-pay

## 2015-05-20 VITALS — Ht 71.0 in | Wt 233.0 lb

## 2015-05-20 DIAGNOSIS — Z1211 Encounter for screening for malignant neoplasm of colon: Secondary | ICD-10-CM

## 2015-05-20 NOTE — Progress Notes (Signed)
No egg or soy allergies Not on home 02 No previous anesthesia complications No diet or weight loss meds 

## 2015-05-21 ENCOUNTER — Ambulatory Visit (HOSPITAL_COMMUNITY)
Admission: RE | Admit: 2015-05-21 | Discharge: 2015-05-21 | Disposition: A | Discharge status: Home / Self Care | Payer: Commercial Managed Care - HMO | Source: Ambulatory Visit | Attending: Cardiology | Admitting: Cardiology

## 2015-05-21 DIAGNOSIS — R0600 Dyspnea, unspecified: Secondary | ICD-10-CM | POA: Diagnosis not present

## 2015-05-21 LAB — EXERCISE TOLERANCE TEST
CSEPED: 8 min
CSEPEW: 10.1 METS
CSEPPHR: 142 {beats}/min
Exercise duration (sec): 0 s
MPHR: 161 {beats}/min
Percent HR: 88 %
RPE: 17
Rest HR: 78 {beats}/min

## 2015-05-26 ENCOUNTER — Ambulatory Visit: Payer: Commercial Managed Care - HMO | Admitting: Cardiovascular Disease

## 2015-05-30 NOTE — Patient Instructions (Addendum)
Please continue the same insulins.  check your blood sugar twice a day.  vary the time of day when you check, between before the 3 meals, and at bedtime.  also check if you have symptoms of your  blood sugar being too high or too low.  please keep a record of the readings and bring it to your next appointment here.  You can write it on any piece of paper.   please call us sooner if your blood sugar goes below 70, or if you have a lot of readings over 200.   i have sent a prescription to your pharmacy, for the blood pressure.   Please come back for a follow-up appointment in 4 months.

## 2015-05-30 NOTE — Progress Notes (Signed)
Subjective:    Patient ID: Aaron Fox, male    DOB: 1955-07-11, 60 y.o.   MRN: [id number]  HPI Pt returns for f/u of diabetes mellitus: DM type: 1 Dx'ed: 123XX123 Complications: sensory neuropathy, retinopathy and nephropathy Therapy: insulin since 2003 DKA: never Severe hypoglycemia: never Pancreatitis: never Other: he can only afford human insulin; he has done better on a bid insulin regimen. Interval history: Pt says he does not miss the insulin.  pt states he feels well in general.  no cbg record, but states cbg's are mildly low approx twice a week.  This happens at lunch is delayed.  He eats a minimal breakfast. It is highest in the afternoon. Past Medical History  Diagnosis Date  . DIABETES MELLITUS, TYPE II 09/22/2006  . HYPOGONADISM, MALE 01/15/2007  . HYPERCHOLESTEROLEMIA 07/02/2008  . GOUT 09/22/2006  . HYPERTENSION 05/31/2009  . CHF 06/19/2008  . ALLERGIC RHINITIS 07/02/2008  . 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 Rheumatoid arthritis(714.0) 09/22/2006  . COUGH DUE TO ACE INHIBITORS 10/05/2008  . Atrial fibrillation ([place])   . ED (erectile dysfunction)   . PUD (peptic ulcer disease)   . Hypokalemia   . Lymphadenopathy   . Glaucoma   . Peripheral neuropathy [facility])     Past Surgical History  Procedure Laterality Date  . Lymph node biopsy    . Cataract extraction, bilateral    . Colonoscopy  2004  . Lymph node biopsy Right 10/16/2014    Procedure: RIGHT INGUINAL LYMPH NODE BIOPSY;  Surgeon: Aviva Signs, MD;  Location: [facility];  Service: General;  Laterality: Right;    Social History   Social History  . Marital Status: Married    Spouse Name: N/A  . Number of Children: 3  . Years of Education: N/A   Occupational History  . Diability    Social History Main Topics  . Smoking status: Former Smoker    Quit date: 11/02/1999  . Smokeless tobacco: Never Used  . Alcohol Use: No  . Drug Use: No  . Sexual Activity: Not on file   Other Topics Concern  . Not on  file   Social History Narrative   Regular exercise-yes    Current Outpatient Prescriptions on File Prior to Visit  Medication Sig Dispense Refill  . ACCU-CHEK FASTCLIX LANCETS MISC Use to check blood sugar 2 times per day. 204 each 2  . Alcohol Swabs (B-D SINGLE USE SWABS REGULAR) PADS Use to check blood sugar two times per day. 200 each 2  . aspirin 81 MG tablet Take 81 mg by mouth daily.      Marland Kitchen atorvastatin (LIPITOR) 10 MG tablet Take 1 tablet (10 mg total) by mouth daily. 90 tablet 3  . gabapentin (NEURONTIN) 300 MG capsule Take 1 capsule (300 mg total) by mouth at bedtime. 90 capsule 3  . glucose blood (ACCU-CHEK SMARTVIEW) test strip Use to check blood sugar times per day. 200 each 2  . insulin NPH Human (HUMULIN N,NOVOLIN N) 100 UNIT/ML injection Inject 60 Units into the skin every morning. Reported on 04/01/2015    . insulin regular (NOVOLIN R RELION) 100 units/mL injection Inject 0.07 mLs (7 Units total) into the skin daily with supper. And pen needles 2/day 10 mL 11   No current facility-administered medications on file prior to visit.    Allergies  Allergen Reactions  . Penicillins Swelling    Swelling of the hands   . Terbinafine Hcl Hives  . Zestril [Lisinopril] Cough  Family History  Problem Relation Age of Onset  . Cancer Neg Hx   . Colon cancer Neg Hx   . Heart disease Other     FH of CAD  . Colon polyps Paternal Uncle   . Kidney disease Paternal Uncle   . Gallbladder disease Neg Hx   . Esophageal cancer Neg Hx   . Diabetes Paternal Grandfather   . Diabetes Maternal Uncle     BP 146/82 mmHg  Pulse 82  Temp(Src) 97.9 F (36.6 C) (Oral)  Ht 5' 11.5" (1.816 m)  Wt 232 lb (105.235 kg)  BMI 31.91 kg/m2  SpO2 95%  Review of Systems Denies LOC    Objective:   Physical Exam VITAL SIGNS:  See vs page GENERAL: no distress Pulses: dorsalis pedis intact bilat.   MSK: no deformity of the feet CV: no leg edema Skin:  no ulcer on the feet.  normal color  and temp on the feet. Neuro: sensation is intact to touch on the feet, but decreased from normal Ext: There is bilateral onychomycosis of the toenails   A1c=7.8%    Assessment & Plan:  DM: this is the best control this pt should aim for, given this regimen, which does match insulin to her changing needs throughout the day. HTN: worse.  Patient is advised the following: Patient Instructions  Please continue the same insulins.  check your blood sugar twice a day.  vary the time of day when you check, between before the 3 meals, and at bedtime.  also check if you have symptoms of your  blood sugar being too high or too low.  please keep a record of the readings and bring it to your next appointment here.  You can write it on any piece of paper.   please call us sooner if your blood sugar goes below 70, or if you have a lot of readings over 200.   i have sent a prescription to your pharmacy, for the blood pressure.   Please come back for a follow-up appointment in 4 months.

## 2015-05-31 ENCOUNTER — Ambulatory Visit (INDEPENDENT_AMBULATORY_CARE_PROVIDER_SITE_OTHER): Payer: Commercial Managed Care - HMO | Admitting: Endocrinology

## 2015-05-31 ENCOUNTER — Encounter: Payer: Self-pay | Admitting: Endocrinology

## 2015-05-31 ENCOUNTER — Telehealth: Payer: Self-pay | Admitting: Endocrinology

## 2015-05-31 VITALS — BP 146/82 | HR 82 | Temp 97.9°F | Ht 71.5 in | Wt 232.0 lb

## 2015-05-31 DIAGNOSIS — E11319 Type 2 diabetes mellitus with unspecified diabetic retinopathy without macular edema: Secondary | ICD-10-CM | POA: Diagnosis not present

## 2015-05-31 DIAGNOSIS — Z794 Long term (current) use of insulin: Secondary | ICD-10-CM

## 2015-05-31 DIAGNOSIS — I10 Essential (primary) hypertension: Secondary | ICD-10-CM | POA: Diagnosis not present

## 2015-05-31 LAB — POCT GLYCOSYLATED HEMOGLOBIN (HGB A1C): Hemoglobin A1C: 7.8

## 2015-05-31 MED ORDER — LOSARTAN POTASSIUM 50 MG PO TABS: 50.0000 mg | ORAL_TABLET | Freq: Every day | ORAL | Status: DC

## 2015-05-31 MED ORDER — "INSULIN SYRINGE 29G X 1/2"" 1 ML MISC": Status: DC

## 2015-05-31 NOTE — Telephone Encounter (Signed)
Syringe size updated.

## 2015-05-31 NOTE — Telephone Encounter (Signed)
Patient calling to give Dr Reola Mosher the length of his syringes, 1 ml cc gauge 29 12.7 mm 1/2 inch.

## 2015-06-03 ENCOUNTER — Encounter: Payer: Self-pay | Admitting: Internal Medicine

## 2015-06-03 ENCOUNTER — Ambulatory Visit (AMBULATORY_SURGERY_CENTER): Payer: Commercial Managed Care - HMO | Admitting: Internal Medicine

## 2015-06-03 VITALS — BP 131/79 | HR 67 | Temp 98.2°F | Resp 18 | Ht 71.0 in | Wt 233.0 lb

## 2015-06-03 DIAGNOSIS — D124 Benign neoplasm of descending colon: Secondary | ICD-10-CM

## 2015-06-03 DIAGNOSIS — D120 Benign neoplasm of cecum: Secondary | ICD-10-CM | POA: Diagnosis not present

## 2015-06-03 DIAGNOSIS — D123 Benign neoplasm of transverse colon: Secondary | ICD-10-CM | POA: Diagnosis not present

## 2015-06-03 DIAGNOSIS — I10 Essential (primary) hypertension: Secondary | ICD-10-CM | POA: Diagnosis not present

## 2015-06-03 DIAGNOSIS — Z1211 Encounter for screening for malignant neoplasm of colon: Secondary | ICD-10-CM | POA: Diagnosis not present

## 2015-06-03 DIAGNOSIS — E119 Type 2 diabetes mellitus without complications: Secondary | ICD-10-CM | POA: Diagnosis not present

## 2015-06-03 DIAGNOSIS — I4891 Unspecified atrial fibrillation: Secondary | ICD-10-CM | POA: Diagnosis not present

## 2015-06-03 DIAGNOSIS — M069 Rheumatoid arthritis, unspecified: Secondary | ICD-10-CM | POA: Diagnosis not present

## 2015-06-03 LAB — GLUCOSE, CAPILLARY
GLUCOSE-CAPILLARY: 141 mg/dL — AB (ref 65–99)
Glucose-Capillary: 173 mg/dL — ABNORMAL HIGH (ref 65–99)

## 2015-06-03 MED ORDER — SODIUM CHLORIDE 0.9 % IV SOLN: 500.0000 mL | INTRAVENOUS | Status: DC

## 2015-06-03 NOTE — Patient Instructions (Addendum)
I found and removed 4 small polyps. You also have a condition called diverticulosis - common and not usually a problem. Please read the handout provided.  I will let you know pathology results and when to have another routine colonoscopy by mail.  I appreciate the opportunity to care for you. Gatha Mayer, MD, [facility]   Discharge instructions given. Handouts on polyps and diverticulosis. Resume previous medications. YOU HAD AN ENDOSCOPIC PROCEDURE TODAY AT [facility]:   Refer to the procedure report that was given to you for any specific questions about what was found during the examination.  If the procedure report does not answer your questions, please call your gastroenterologist to clarify.  If you requested that your care partner not be given the details of your procedure findings, then the procedure report has been included in a sealed envelope for you to review at your convenience later.  YOU SHOULD EXPECT: Some feelings of bloating in the abdomen. Passage of more gas than usual.  Walking can help get rid of the air that was put into your GI tract during the procedure and reduce the bloating. If you had a lower endoscopy (such as a colonoscopy or flexible sigmoidoscopy) you may notice spotting of blood in your stool or on the toilet paper. If you underwent a bowel prep for your procedure, you may not have a normal bowel movement for a few days.  Please Note:  You might notice some irritation and congestion in your nose or some drainage.  This is from the oxygen used during your procedure.  There is no need for concern and it should clear up in a day or so.  SYMPTOMS TO REPORT IMMEDIATELY:   Following lower endoscopy (colonoscopy or flexible sigmoidoscopy):  Excessive amounts of blood in the stool  Significant tenderness or worsening of abdominal pains  Swelling of the abdomen that is new, acute  Fever of 100F or higher   For urgent or emergent issues, a  gastroenterologist can be reached at any hour by calling [phone].   DIET: Your first meal following the procedure should be a small meal and then it is ok to progress to your normal diet. Heavy or fried foods are harder to digest and may make you feel nauseous or bloated.  Likewise, meals heavy in dairy and vegetables can increase bloating.  Drink plenty of fluids but you should avoid alcoholic beverages for 24 hours.  ACTIVITY:  You should plan to take it easy for the rest of today and you should NOT DRIVE or use heavy machinery until tomorrow (because of the sedation medicines used during the test).    FOLLOW UP: Our staff will call the number listed on your records the next business day following your procedure to check on you and address any questions or concerns that you may have regarding the information given to you following your procedure. If we do not reach you, we will leave a message.  However, if you are feeling well and you are not experiencing any problems, there is no need to return our call.  We will assume that you have returned to your regular daily activities without incident.  If any biopsies were taken you will be contacted by phone or by letter within the next 1-3 weeks.  Please call us at [phone] if you have not heard about the biopsies in 3 weeks.    SIGNATURES/CONFIDENTIALITY: You and/or your care partner have signed paperwork which will  be entered into your electronic medical record.  These signatures attest to the fact that that the information above on your After Visit Summary has been reviewed and is understood.  Full responsibility of the confidentiality of this discharge information lies with you and/or your care-partner. 

## 2015-06-03 NOTE — Progress Notes (Signed)
Report to PACU, RN, vss, BBS= Clear.  

## 2015-06-03 NOTE — Op Note (Signed)
[place] Patient Name: Aaron Fox Procedure Date: 06/03/2015 7:51 AM MRN: [id number] Endoscopist: Gatha Mayer , MD Age: 60 Date of Birth: 06-06-1955 Gender: Male Procedure:                Colonoscopy Indications:              Screening for colorectal malignant neoplasm Medicines:                Propofol per Anesthesia, Monitored Anesthesia Care Procedure:                Pre-Anesthesia Assessment:                           - Prior to the procedure, a History and Physical                            was performed, and patient medications and                            allergies were reviewed. The patient's tolerance of                            previous anesthesia was also reviewed. The risks                            and benefits of the procedure and the sedation                            options and risks were discussed with the patient.                            All questions were answered, and informed consent                            was obtained. Prior Anticoagulants: The patient has                            taken no previous anticoagulant or antiplatelet                            agents. ASA Grade Assessment: III - A patient with                            severe systemic disease. After reviewing the risks                            and benefits, the patient was deemed in                            satisfactory condition to undergo the procedure.                           After obtaining informed consent, the colonoscope  was passed under direct vision. Throughout the                            procedure, the patient's blood pressure, pulse, and                            oxygen saturations were monitored continuously. The                            Model CF-HQ190L [id number]) scope was introduced                            through the anus and advanced to the the cecum,                            identified by appendiceal  orifice and ileocecal                            valve. The colonoscopy was performed without                            difficulty. The patient tolerated the procedure                            well. The quality of the bowel preparation was                            good. The bowel preparation used was Miralax. The                            terminal ileum, ileocecal valve, appendiceal                            orifice, and rectum were photographed. Scope In: 8:15:42 AM Scope Out: [id number] AM Scope Withdrawal Time: 0 hours 15 minutes 11 seconds  Total Procedure Duration: 0 hours 20 minutes 59 seconds  Findings:                 The perianal and digital rectal examinations were                            normal. Pertinent negatives include normal prostate                            (size, shape, and consistency).                           Four sessile polyps were found in the descending                            colon, transverse colon and cecum. The polyps were                            3 to 7 mm in size. These polyps were removed with  a                            cold snare. Resection and retrieval were complete.                            Verification of patient identification for the                            specimen was done. Estimated blood loss was minimal.                           Diverticula were found in the sigmoid colon.                           The exam was otherwise without abnormality on                            direct and retroflexion views. Complications:            No immediate complications. Estimated blood loss:                            Minimal. Estimated Blood Loss:     Estimated blood loss was minimal. Impression:               - Four 3 to 7 mm polyps in the descending colon, in                            the transverse colon and in the cecum, removed with                            a cold snare. Resected and retrieved.                           - Mild  diverticulosis in the sigmoid colon.                           - The examination was otherwise normal on direct                            and retroflexion views. Recommendation:           - Written discharge instructions were provided to                            the patient.                           - Resume previous diet.                           - Continue present medications.                           - Repeat colonoscopy is recommended. The  colonoscopy date will be determined after pathology                            results from today's exam become available for                            review. Gatha Mayer, MD 06/03/2015 8:47:23 AM This report has been signed electronically.

## 2015-06-03 NOTE — Progress Notes (Signed)
Called to room to assist during endoscopic procedure.  Patient ID and intended procedure confirmed with present staff. Received instructions for my participation in the procedure from the performing physician.  

## 2015-06-04 ENCOUNTER — Telehealth: Payer: Self-pay | Admitting: *Deleted

## 2015-06-04 NOTE — Telephone Encounter (Signed)
  Follow up Call-  Call back number 06/03/2015  Post procedure Call Back phone  # [phone]  Permission to leave phone message Yes     Patient questions:  Do you have a fever, pain , or abdominal swelling? No. Pain Score  0 *  Have you tolerated food without any problems? Yes.    Have you been able to return to your normal activities? Yes.    Do you have any questions about your discharge instructions: Diet   No. Medications  No. Follow up visit  No.  Do you have questions or concerns about your Care? No.  Actions: * If pain score is 4 or above: No action needed, pain <4.

## 2015-06-09 ENCOUNTER — Encounter: Payer: Self-pay | Admitting: Internal Medicine

## 2015-06-09 DIAGNOSIS — Z86010 Personal history of colonic polyps: Secondary | ICD-10-CM

## 2015-06-09 DIAGNOSIS — Z860101 Personal history of adenomatous and serrated colon polyps: Secondary | ICD-10-CM

## 2015-06-09 HISTORY — DX: Personal history of colonic polyps: Z86.010

## 2015-06-09 HISTORY — DX: Personal history of adenomatous and serrated colon polyps: Z86.0101

## 2015-06-09 NOTE — Progress Notes (Signed)
Quick Note:  4 adenomas max 7 mm Recall 2020 ______

## 2015-09-23 ENCOUNTER — Ambulatory Visit (INDEPENDENT_AMBULATORY_CARE_PROVIDER_SITE_OTHER): Payer: Commercial Managed Care - HMO | Admitting: Endocrinology

## 2015-09-23 VITALS — BP 120/80 | HR 76 | Wt 226.0 lb

## 2015-09-23 DIAGNOSIS — E1065 Type 1 diabetes mellitus with hyperglycemia: Secondary | ICD-10-CM

## 2015-09-23 DIAGNOSIS — E1042 Type 1 diabetes mellitus with diabetic polyneuropathy: Secondary | ICD-10-CM | POA: Diagnosis not present

## 2015-09-23 DIAGNOSIS — IMO0002 Reserved for concepts with insufficient information to code with codable children: Secondary | ICD-10-CM

## 2015-09-23 MED ORDER — INSULIN REGULAR HUMAN 100 UNIT/ML IJ SOLN: 12.0000 [IU] | Freq: Every day | INTRAMUSCULAR | 11 refills | Status: DC

## 2015-09-23 MED ORDER — LOSARTAN POTASSIUM 50 MG PO TABS: 50.0000 mg | ORAL_TABLET | Freq: Every day | ORAL | 3 refills | Status: DC

## 2015-09-23 NOTE — Patient Instructions (Addendum)
Please continue the same morning insulin, but increase the regular at supper to 12 units.  check your blood sugar twice a day.  vary the time of day when you check, between before the 3 meals, and at bedtime.  also check if you have symptoms of your  blood sugar being too high or too low.  please keep a record of the readings and bring it to your next appointment here.  You can write it on any piece of paper.   please call us sooner if your blood sugar goes below 70, or if you have a lot of readings over 200.   Please continue the same medication, for the blood pressure.   Please come back for a regular physical appointment in 3 months.

## 2015-09-23 NOTE — Progress Notes (Signed)
Subjective:    Patient ID: Aaron Fox, male    DOB: 1955/10/24, 60 y.o.   MRN: [id number]  HPI Pt returns for f/u of diabetes mellitus: DM type: 1 Dx'ed: 123XX123 Complications: polyneuropathy, retinopathy and nephropathy Therapy: insulin since 2003 DKA: never Severe hypoglycemia: never Pancreatitis: never Other: he can only afford human insulin; he has done better on a bid insulin regimen.   Interval history: Pt says he does not miss the insulin.  pt states he feels well in general.  no cbg record, but states cbg's are mildly low approx twice a week, usually before lunch.  This happens when he has a smaller than expected breakfast. He says cbg's are not affected by activity.  It is highest at [facility] Past Medical History:  Diagnosis Date  . ALLERGIC RHINITIS 07/02/2008  . Atrial fibrillation ([place])   . CHF 06/19/2008  . COUGH DUE TO ACE INHIBITORS 10/05/2008  . DIABETES MELLITUS, TYPE II 09/22/2006  . ED (erectile dysfunction)   . Glaucoma   . GOUT 09/22/2006  . Hx of adenomatous colonic polyps 06/09/2015  . HYPERCHOLESTEROLEMIA 07/02/2008  . HYPERTENSION 05/31/2009  . HYPOGONADISM, MALE 01/15/2007  . Hypokalemia   . 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 Lymphadenopathy   . Peripheral neuropathy ([place])   . PUD (peptic ulcer disease)   . Rheumatoid arthritis(714.0) 09/22/2006    Past Surgical History:  Procedure Laterality Date  . CATARACT EXTRACTION, BILATERAL    . COLONOSCOPY  2004  . LYMPH NODE BIOPSY    . LYMPH NODE BIOPSY Right 10/16/2014   Procedure: RIGHT INGUINAL LYMPH NODE BIOPSY;  Surgeon: Aviva Signs, MD;  Location: [facility];  Service: General;  Laterality: Right;    Social History   Social History  . Marital status: Married    Spouse name: N/A  . Number of children: 3  . Years of education: N/A   Occupational History  . Diability [employer]   Social History Main Topics  . Smoking status: Former Smoker    Quit date: 11/02/1999  . Smokeless tobacco: Never  Used  . Alcohol use No  . Drug use: No  . Sexual activity: Not on file   Other Topics Concern  . Not on file   Social History Narrative   Regular exercise-yes    Current Outpatient Prescriptions on File Prior to Visit  Medication Sig Dispense Refill  . ACCU-CHEK FASTCLIX LANCETS MISC Use to check blood sugar 2 times per day. 204 each 2  . Alcohol Swabs (B-D SINGLE USE SWABS REGULAR) PADS Use to check blood sugar two times per day. 200 each 2  . aspirin 81 MG tablet Take 81 mg by mouth daily.      Marland Kitchen atorvastatin (LIPITOR) 10 MG tablet Take 1 tablet (10 mg total) by mouth daily. 90 tablet 3  . gabapentin (NEURONTIN) 300 MG capsule Take 1 capsule (300 mg total) by mouth at bedtime. 90 capsule 3  . glucose blood (ACCU-CHEK SMARTVIEW) test strip Use to check blood sugar times per day. 200 each 2  . insulin NPH Human (HUMULIN N,NOVOLIN N) 100 UNIT/ML injection Inject 60 Units into the skin every morning. Reported on 04/01/2015    . INSULIN SYRINGE 1CC/29G 29G X 1/2" 1 ML MISC Use to inject insulin 4 times per day 360 each 1   No current facility-administered medications on file prior to visit.     Allergies  Allergen Reactions  . Penicillins Swelling    Swelling of  the hands   . Terbinafine Hcl Hives  . Zestril [Lisinopril] Cough    Family History  Problem Relation Age of Onset  . Cancer Neg Hx   . Colon cancer Neg Hx   . Heart disease Other     FH of CAD  . Colon polyps Paternal Uncle   . Kidney disease Paternal Uncle   . Gallbladder disease Neg Hx   . Esophageal cancer Neg Hx   . Diabetes Paternal Grandfather   . Diabetes Maternal Uncle     BP 120/80   Pulse 76   Wt 226 lb (102.5 kg)   SpO2 97%   BMI 31.52 kg/m   Review of Systems Denies LOC.     Objective:   Physical Exam VITAL SIGNS:  See vs page GENERAL: no distress Pulses: dorsalis pedis intact bilat.   MSK: no deformity of the feet.    CV: no leg edema.  Skin:  no ulcer on the feet.  normal color and  temp on the feet.  At the tip of the right great toenail, there is a 2 cm heavy callus, and ecchymosis. Neuro: sensation is intact to touch on the feet, but decreased from normal.  Ext: There is bilateral onychomycosis of the toenails.      A1c=8.4%.    Assessment & Plan:  type 1 DM: he needs increased rx.   HTN: worse: pt says situational.   Ecchymosis, new.  Pt is advised to call if worse.  Patient is advised the following: Patient Instructions  Please continue the same morning insulin, but increase the regular at supper to 12 units.  check your blood sugar twice a day.  vary the time of day when you check, between before the 3 meals, and at bedtime.  also check if you have symptoms of your  blood sugar being too high or too low.  please keep a record of the readings and bring it to your next appointment here.  You can write it on any piece of paper.   please call us sooner if your blood sugar goes below 70, or if you have a lot of readings over 200.   Please continue the same medication, for the blood pressure.   Please come back for a regular physical appointment in 3 months.   Renato Shin, MD

## 2015-09-27 DIAGNOSIS — M25561 Pain in right knee: Secondary | ICD-10-CM | POA: Diagnosis not present

## 2015-09-27 DIAGNOSIS — M170 Bilateral primary osteoarthritis of knee: Secondary | ICD-10-CM | POA: Diagnosis not present

## 2015-09-27 DIAGNOSIS — M1712 Unilateral primary osteoarthritis, left knee: Secondary | ICD-10-CM | POA: Diagnosis not present

## 2015-09-27 DIAGNOSIS — M25562 Pain in left knee: Secondary | ICD-10-CM | POA: Diagnosis not present

## 2015-10-11 DIAGNOSIS — M25562 Pain in left knee: Secondary | ICD-10-CM | POA: Diagnosis not present

## 2015-10-11 DIAGNOSIS — M1712 Unilateral primary osteoarthritis, left knee: Secondary | ICD-10-CM | POA: Diagnosis not present

## 2015-10-18 DIAGNOSIS — M25562 Pain in left knee: Secondary | ICD-10-CM | POA: Diagnosis not present

## 2015-10-18 DIAGNOSIS — M1712 Unilateral primary osteoarthritis, left knee: Secondary | ICD-10-CM | POA: Diagnosis not present

## 2015-10-25 DIAGNOSIS — M1712 Unilateral primary osteoarthritis, left knee: Secondary | ICD-10-CM | POA: Diagnosis not present

## 2015-10-25 DIAGNOSIS — M25562 Pain in left knee: Secondary | ICD-10-CM | POA: Diagnosis not present

## 2015-11-02 DIAGNOSIS — M25562 Pain in left knee: Secondary | ICD-10-CM | POA: Diagnosis not present

## 2015-11-02 DIAGNOSIS — M1712 Unilateral primary osteoarthritis, left knee: Secondary | ICD-10-CM | POA: Diagnosis not present

## 2015-11-08 DIAGNOSIS — M1712 Unilateral primary osteoarthritis, left knee: Secondary | ICD-10-CM | POA: Diagnosis not present

## 2015-11-08 DIAGNOSIS — M25562 Pain in left knee: Secondary | ICD-10-CM | POA: Diagnosis not present

## 2015-12-20 ENCOUNTER — Encounter: Payer: Self-pay | Admitting: Endocrinology

## 2015-12-20 ENCOUNTER — Ambulatory Visit (INDEPENDENT_AMBULATORY_CARE_PROVIDER_SITE_OTHER): Payer: Commercial Managed Care - HMO | Admitting: Endocrinology

## 2015-12-20 VITALS — BP 122/78 | HR 72 | Ht 71.0 in | Wt 234.0 lb

## 2015-12-20 DIAGNOSIS — E1042 Type 1 diabetes mellitus with diabetic polyneuropathy: Secondary | ICD-10-CM

## 2015-12-20 DIAGNOSIS — E1065 Type 1 diabetes mellitus with hyperglycemia: Secondary | ICD-10-CM | POA: Diagnosis not present

## 2015-12-20 DIAGNOSIS — Z23 Encounter for immunization: Secondary | ICD-10-CM

## 2015-12-20 DIAGNOSIS — IMO0002 Reserved for concepts with insufficient information to code with codable children: Secondary | ICD-10-CM

## 2015-12-20 LAB — MICROALBUMIN / CREATININE URINE RATIO
Creatinine,U: 108.8 mg/dL
Microalb Creat Ratio: 42.4 mg/g — ABNORMAL HIGH (ref 0.0–30.0)
Microalb, Ur: 46.1 mg/dL — ABNORMAL HIGH (ref 0.0–1.9)

## 2015-12-20 LAB — POCT GLYCOSYLATED HEMOGLOBIN (HGB A1C): Hemoglobin A1C: 8.2

## 2015-12-20 MED ORDER — INSULIN REGULAR HUMAN 100 UNIT/ML IJ SOLN: 12.0000 [IU] | Freq: Every day | INTRAMUSCULAR | 11 refills | Status: DC

## 2015-12-20 NOTE — Patient Instructions (Addendum)
Please continue the same morning insulin, but increase the regular at supper to 12 units.   check your blood sugar twice a day.  vary the time of day when you check, between before the 3 meals, and at bedtime.  also check if you have symptoms of your blood sugar being too high or too low.  please keep a record of the readings and bring it to your next appointment here.  You can write it on any piece of paper.   please call us sooner if your blood sugar goes below 70, or if you have a lot of readings over 200.   Please come back for a regular physical appointment in 3 months.

## 2015-12-20 NOTE — Progress Notes (Signed)
Subjective:    Patient ID: Aaron Fox, male    DOB: 08-20-1955, 60 y.o.   MRN: [id number]  HPI Pt returns for f/u of diabetes mellitus: DM type: 1 Dx'ed: 123XX123 Complications: polyneuropathy, retinopathy and nephropathy Therapy: insulin since 2003 DKA: never Severe hypoglycemia: never Pancreatitis: never.  Other: he can only afford human insulin; he has done better on a bid insulin regimen.   Interval history: Pt says he does not miss the insulin.  Pt says he has mild hypoglycemia approx twice per month.  no cbg record, but states cbg's are lowest in the mid-morning, and highest at hs.  He takes NPH, 60 units qam and Reg, 7 units qpm.   Past Medical History:  Diagnosis Date  . ALLERGIC RHINITIS 07/02/2008  . Atrial fibrillation ([place])   . CHF 06/19/2008  . COUGH DUE TO ACE INHIBITORS 10/05/2008  . DIABETES MELLITUS, TYPE II 09/22/2006  . ED (erectile dysfunction)   . Glaucoma   . GOUT 09/22/2006  . Hx of adenomatous colonic polyps 06/09/2015  . HYPERCHOLESTEROLEMIA 07/02/2008  . HYPERTENSION 05/31/2009  . HYPOGONADISM, MALE 01/15/2007  . Hypokalemia   . 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 Lymphadenopathy   . Peripheral neuropathy ([place])   . PUD (peptic ulcer disease)   . Rheumatoid arthritis(714.0) 09/22/2006    Past Surgical History:  Procedure Laterality Date  . CATARACT EXTRACTION, BILATERAL    . COLONOSCOPY  2004  . LYMPH NODE BIOPSY    . LYMPH NODE BIOPSY Right 10/16/2014   Procedure: RIGHT INGUINAL LYMPH NODE BIOPSY;  Surgeon: Aviva Signs, MD;  Location: [facility];  Service: General;  Laterality: Right;    Social History   Social History  . Marital status: Married    Spouse name: N/A  . Number of children: 3  . Years of education: N/A   Occupational History  . Diability [employer]   Social History Main Topics  . Smoking status: Former Smoker    Quit date: 11/02/1999  . Smokeless tobacco: Never Used  . Alcohol use No  . Drug use: No  . Sexual  activity: Not on file   Other Topics Concern  . Not on file   Social History Narrative   Regular exercise-yes    Current Outpatient Prescriptions on File Prior to Visit  Medication Sig Dispense Refill  . ACCU-CHEK FASTCLIX LANCETS MISC Use to check blood sugar 2 times per day. 204 each 2  . Alcohol Swabs (B-D SINGLE USE SWABS REGULAR) PADS Use to check blood sugar two times per day. 200 each 2  . aspirin 81 MG tablet Take 81 mg by mouth daily.      Marland Kitchen atorvastatin (LIPITOR) 10 MG tablet Take 1 tablet (10 mg total) by mouth daily. 90 tablet 3  . gabapentin (NEURONTIN) 300 MG capsule Take 1 capsule (300 mg total) by mouth at bedtime. 90 capsule 3  . glucose blood (ACCU-CHEK SMARTVIEW) test strip Use to check blood sugar times per day. 200 each 2  . insulin NPH Human (HUMULIN N,NOVOLIN N) 100 UNIT/ML injection Inject 60 Units into the skin every morning. Reported on 04/01/2015    . INSULIN SYRINGE 1CC/29G 29G X 1/2" 1 ML MISC Use to inject insulin 4 times per day 360 each 1  . losartan (COZAAR) 50 MG tablet Take 1 tablet (50 mg total) by mouth daily. 90 tablet 3   No current facility-administered medications on file prior to visit.     Allergies  Allergen Reactions  . Penicillins Swelling    Swelling of the hands   . Terbinafine Hcl Hives  . Zestril [Lisinopril] Cough    Family History  Problem Relation Age of Onset  . Cancer Neg Hx   . Colon cancer Neg Hx   . Heart disease Other     FH of CAD  . Colon polyps Paternal Uncle   . Kidney disease Paternal Uncle   . Gallbladder disease Neg Hx   . Esophageal cancer Neg Hx   . Diabetes Paternal Grandfather   . Diabetes Maternal Uncle     BP 122/78   Pulse 72   Ht 5\' 11"  (1.803 m)   Wt 234 lb (106.1 kg)   SpO2 98%   BMI 32.64 kg/m    Review of Systems Denies LOC.      Objective:   Physical Exam VITAL SIGNS:  See vs page.  GENERAL: no distress.  Pulses: dorsalis pedis intact bilat.   MSK: no deformity of the feet.     CV: no leg edema.  Skin:  no ulcer on the feet.  normal color and temp on the feet.  At the tip of the right great toenail, there is a 2 cm heavy callus, and ecchymosis. Neuro: sensation is intact to touch on the feet, but decreased from normal.  Ext: There is bilateral onychomycosis of the toenails.   A1c=8.2%     Assessment & Plan:  Noncompliance with cbg recording and insulin dosing.   Type 1 DM, with retinopathy: worse. Patient is advised the following: Patient Instructions  Please continue the same morning insulin, but increase the regular at supper to 12 units.   check your blood sugar twice a day.  vary the time of day when you check, between before the 3 meals, and at bedtime.  also check if you have symptoms of your blood sugar being too high or too low.  please keep a record of the readings and bring it to your next appointment here.  You can write it on any piece of paper.   please call us sooner if your blood sugar goes below 70, or if you have a lot of readings over 200.   Please come back for a regular physical appointment in 3 months.

## 2015-12-27 ENCOUNTER — Telehealth: Payer: Self-pay | Admitting: Endocrinology

## 2015-12-27 DIAGNOSIS — M25511 Pain in right shoulder: Secondary | ICD-10-CM

## 2015-12-27 NOTE — Telephone Encounter (Deleted)
Patient need a referral Dx code is M17.12 his appointment is tomorrow

## 2015-12-27 NOTE — Telephone Encounter (Signed)
[employer] called requesting a new referral for the patient to be seen tomorrow in their office. Dr. Joan Mayans NPI # [id number]

## 2015-12-27 NOTE — Telephone Encounter (Signed)
done

## 2015-12-27 NOTE — Telephone Encounter (Signed)
See message and please advise, Thanks!  

## 2015-12-28 ENCOUNTER — Telehealth: Payer: Self-pay | Admitting: Endocrinology

## 2015-12-28 DIAGNOSIS — M25561 Pain in right knee: Secondary | ICD-10-CM | POA: Diagnosis not present

## 2015-12-28 DIAGNOSIS — M170 Bilateral primary osteoarthritis of knee: Secondary | ICD-10-CM | POA: Diagnosis not present

## 2015-12-28 DIAGNOSIS — M25562 Pain in left knee: Secondary | ICD-10-CM | POA: Diagnosis not present

## 2015-12-28 DIAGNOSIS — M1712 Unilateral primary osteoarthritis, left knee: Secondary | ICD-10-CM | POA: Diagnosis not present

## 2015-12-28 NOTE — Telephone Encounter (Signed)
[employer] called to verify the directions for the Novolin insulin.

## 2015-12-29 NOTE — Telephone Encounter (Signed)
See message and please advise, Thanks!  

## 2015-12-29 NOTE — Telephone Encounter (Signed)
I contacted Shirlee Limerick and advised of MD's message. Requested a call back if the patient would need another referral placed.

## 2015-12-29 NOTE — Telephone Encounter (Signed)
Shirlee Limerick with [employer] called back to speak with you. Same call back number.

## 2015-12-29 NOTE — Telephone Encounter (Signed)
Referral to ortho was done on 12/27/15.  Does another one need to be made?

## 2015-12-29 NOTE — Telephone Encounter (Signed)
Shirlee Limerick calling from [facility] stated patient need a need a follow up extension referral to be seen, was seen yesterday 12/28/15. NPI # [id number]  [phone]

## 2015-12-30 NOTE — Telephone Encounter (Signed)
[place] [phone]  Calling for clarification on the Novolin R syringe rx

## 2015-12-31 NOTE — Telephone Encounter (Signed)
I contacted Shirlee Limerick with [employer] and requested a call back to discuss what she needs for the patient. Also contacted [facility] and was advised the clarification for the insulin syringes was obtained on 12/29/2015 and no other information was needed at this time.

## 2016-02-04 ENCOUNTER — Ambulatory Visit (INDEPENDENT_AMBULATORY_CARE_PROVIDER_SITE_OTHER): Payer: Self-pay | Admitting: Orthopaedic Surgery

## 2016-02-07 ENCOUNTER — Telehealth: Payer: Self-pay | Admitting: Endocrinology

## 2016-02-07 DIAGNOSIS — L97509 Non-pressure chronic ulcer of other part of unspecified foot with unspecified severity: Principal | ICD-10-CM

## 2016-02-07 DIAGNOSIS — E11621 Type 2 diabetes mellitus with foot ulcer: Secondary | ICD-10-CM

## 2016-02-07 NOTE — Telephone Encounter (Signed)
Ok, I sent referral. Also, this may take time.  I think this is something we should check here in the office.  Please offer pt appt tomorrow, here.

## 2016-02-07 NOTE — Telephone Encounter (Signed)
See message and please advise, Thanks!  

## 2016-02-07 NOTE — Telephone Encounter (Signed)
I contacted the patient and advised of message. Patient stated he could come in tomorrow at 815. Colletta [place] advised to add patient to the schedule.

## 2016-02-07 NOTE — Telephone Encounter (Signed)
Pt called and requested a referral to [facility].  He said he is having an issue with his toe.

## 2016-02-08 ENCOUNTER — Encounter: Payer: Self-pay | Admitting: Endocrinology

## 2016-02-08 ENCOUNTER — Ambulatory Visit (INDEPENDENT_AMBULATORY_CARE_PROVIDER_SITE_OTHER): Payer: Commercial Managed Care - HMO | Admitting: Endocrinology

## 2016-02-08 DIAGNOSIS — M5412 Radiculopathy, cervical region: Secondary | ICD-10-CM | POA: Diagnosis not present

## 2016-02-08 MED ORDER — CLINDAMYCIN HCL 150 MG PO CAPS: 150.0000 mg | ORAL_CAPSULE | Freq: Three times a day (TID) | ORAL | 0 refills | Status: DC

## 2016-02-08 NOTE — Progress Notes (Signed)
Subjective:    Patient ID: Aaron Fox, male    DOB: 1955/12/14, 60 y.o.   MRN: [id number]  HPI Pt states few days of moderate ulcer at the right middle toe, and assoc pain.   Past Medical History:  Diagnosis Date  . ALLERGIC RHINITIS 07/02/2008  . Atrial fibrillation ([place])   . CHF 06/19/2008  . COUGH DUE TO ACE INHIBITORS 10/05/2008  . DIABETES MELLITUS, TYPE II 09/22/2006  . ED (erectile dysfunction)   . Glaucoma   . GOUT 09/22/2006  . Hx of adenomatous colonic polyps 06/09/2015  . HYPERCHOLESTEROLEMIA 07/02/2008  . HYPERTENSION 05/31/2009  . HYPOGONADISM, MALE 01/15/2007  . Hypokalemia   . 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 Lymphadenopathy   . Peripheral neuropathy ([place])   . PUD (peptic ulcer disease)   . Rheumatoid arthritis(714.0) 09/22/2006    Past Surgical History:  Procedure Laterality Date  . CATARACT EXTRACTION, BILATERAL    . COLONOSCOPY  2004  . LYMPH NODE BIOPSY    . LYMPH NODE BIOPSY Right 10/16/2014   Procedure: RIGHT INGUINAL LYMPH NODE BIOPSY;  Surgeon: Aviva Signs, MD;  Location: [facility];  Service: General;  Laterality: Right;    Social History   Social History  . Marital status: Married    Spouse name: N/A  . Number of children: 3  . Years of education: N/A   Occupational History  . [employer]ability [employer]   Social History Main Topics  . Smoking status: Former Smoker    Quit date: 11/02/1999  . Smokeless tobacco: Never Used  . Alcohol use No  . Drug use: No  . Sexual activity: Not on file   Other Topics Concern  . Not on file   Social History Narrative   Regular exercise-yes    Current Outpatient Prescriptions on File Prior to Visit  Medication Sig Dispense Refill  . ACCU-CHEK FASTCLIX LANCETS MISC Use to check blood sugar 2 times per day. 204 each 2  . Alcohol Swabs (B-D SINGLE USE SWABS REGULAR) PADS Use to check blood sugar two times per day. 200 each 2  . aspirin 81 MG tablet Take 81 mg by mouth daily.      Marland Kitchen atorvastatin  (LIPITOR) 10 MG tablet Take 1 tablet (10 mg total) by mouth daily. 90 tablet 3  . gabapentin (NEURONTIN) 300 MG capsule Take 1 capsule (300 mg total) by mouth at bedtime. 90 capsule 3  . glucose blood (ACCU-CHEK SMARTVIEW) test strip Use to check blood sugar times per day. 200 each 2  . insulin NPH Human (HUMULIN N,NOVOLIN N) 100 UNIT/ML injection Inject 60 Units into the skin every morning. Reported on 04/01/2015    . insulin regular (NOVOLIN R RELION) 100 units/mL injection Inject 0.12 mLs (12 Units total) into the skin daily with supper. And syringes 2/day 10 mL 11  . INSULIN SYRINGE 1CC/29G 29G X 1/2" 1 ML MISC Use to inject insulin 4 times per day 360 each 1  . losartan (COZAAR) 50 MG tablet Take 1 tablet (50 mg total) by mouth daily. 90 tablet 3   No current facility-administered medications on file prior to visit.     Allergies  Allergen Reactions  . Penicillins Swelling    Swelling of the hands   . Terbinafine Hcl Hives  . Zestril [Lisinopril] Cough    Family History  Problem Relation Age of Onset  . Cancer Neg Hx   . Colon cancer Neg Hx   . Heart disease Other  FH of CAD  . Colon polyps Paternal Uncle   . Kidney disease Paternal Uncle   . Gallbladder disease Neg Hx   . Esophageal cancer Neg Hx   . Diabetes Paternal Grandfather   . Diabetes Maternal Uncle    BP (!) 146/82   Pulse 87   Ht 5\' 11"  (1.803 m)   Wt 235 lb (106.6 kg)   SpO2 90%   BMI 32.78 kg/m   Review of Systems Denies fever and drainage. Aaron Fox has pain and numbness of the RUE.      Objective:   Physical Exam VITAL SIGNS:  See vs page GENERAL: no distress Pulses: dorsalis pedis intact bilat.   MSK: no deformity of the feet.   CV: no leg edema.  Skin:  1 cm ulcer at the lateral aspect of the right middle toe.  The toe is swollen, also.  normal color and temp on the feet.  At the tip of the right great toenail, there is a 2 cm heavy callus, and ecchymosis. Neuro: sensation is intact to touch on the  feet, but decreased from normal.  Ext: There is bilateral onychomycosis of the toenails.       Assessment & Plan:  Toe ulcer, new RUE pain, prob radicular, new.  Patient is advised the following: Patient Instructions  Please see 2 specialists (pinched nerve and toe ulcer).  you will receive a phone call, about days and times for appointments.  I have sent a prescription to your pharmacy, for an antibiotic pill.  Stop this, and call us right away, if you get diarrhea.  Keep the ulcer covered with antibiotic ointment and a bandaid.   I'll see you next time.

## 2016-02-08 NOTE — Patient Instructions (Addendum)
Please see 2 specialists (pinched nerve and toe ulcer).  you will receive a phone call, about days and times for appointments.  I have sent a prescription to your pharmacy, for an antibiotic pill.  Stop this, and call us right away, if you get diarrhea.  Keep the ulcer covered with antibiotic ointment and a bandaid.   I'll see you next time.

## 2016-02-16 ENCOUNTER — Ambulatory Visit
Admission: RE | Admit: 2016-02-16 | Discharge: 2016-02-16 | Disposition: A | Discharge status: Home / Self Care | Payer: Commercial Managed Care - HMO | Source: Ambulatory Visit | Attending: Sports Medicine | Admitting: Sports Medicine

## 2016-02-16 ENCOUNTER — Ambulatory Visit (INDEPENDENT_AMBULATORY_CARE_PROVIDER_SITE_OTHER): Payer: Commercial Managed Care - HMO | Admitting: Sports Medicine

## 2016-02-16 VITALS — BP 149/75 | Ht 71.0 in | Wt 235.0 lb

## 2016-02-16 DIAGNOSIS — M5010 Cervical disc disorder with radiculopathy, unspecified cervical region: Secondary | ICD-10-CM

## 2016-02-16 DIAGNOSIS — M50222 Other cervical disc displacement at C5-C6 level: Secondary | ICD-10-CM | POA: Diagnosis not present

## 2016-02-16 MED ORDER — GABAPENTIN 300 MG PO CAPS
300.0000 mg | ORAL_CAPSULE | Freq: Two times a day (BID) | ORAL | 2 refills | Status: DC
Start: 2016-02-16 — End: 2016-07-05

## 2016-02-16 NOTE — Addendum Note (Signed)
Addended by: Cyd Silence on: 02/16/2016 04:49 PM   Modules accepted: Orders

## 2016-02-16 NOTE — Progress Notes (Signed)
   Subjective:    Patient ID: Aaron Fox, male    DOB: 1955/08/08, 60 y.o.   MRN: [id number]  HPI chief complaint: Right arm pain  Very pleasant right-hand-dominant 60 year old male comes in today complaining of 1 month of right arm pain. He denies any recent trauma. He describes a "pins and needles" type of pain that begins on the right side of his neck and radiates down the right arm into the right hand. He had a similar episode a couple of years ago which resolved without needing to seek medical attention. His symptoms are intermittent. They're particularly bothersome at night. He denies any deep-seated shoulder pain. He denies any weakness. He has not had any recent imaging. No prior surgeries to his neck or shoulder in the past.  Past medical history reviewed Medications reviewed Allergies reviewed    Review of Systems    as above Objective:   Physical Exam  Well-developed, well-nourished. No acute distress. Awake alert and oriented 3. Vital signs reviewed  Cervical spine: Full painless cervical range of motion. No tenderness to palpation along the cervical midline. No spasm. Negative Spurling's.  Right shoulder: Full range of motion. No tenderness to palpation. 5/5 strength. No signs of impingement.  Neurological exam: Strength is 5/5 in both upper extremities. Reflexes are trace but equal at the biceps, triceps, and brachial radialis tendons. Sensation is intact to light touch grossly. No atrophy. Good pulses.  X-rays of his cervical spine shows moderately advanced degenerative disc disease at C5-C6. There appears to be neural foraminal encroachment on the right at C5. Nothing acute.      Assessment & Plan:   Right arm pain secondary to cervical degenerative disc disease and cervical radiculopathy  Patient is diabetic so I want to avoid oral prednisone. He is on 300 mg of gabapentin daily at bedtime. I would like for him to increase this to twice a day as long as he  does not experience any side effects. I will also refer him for some physical therapy at [facility]. Follow-up with me in 3 weeks. If symptoms persist, consider merits of further diagnostic imaging in anticipation of referral for cervical ESI's.

## 2016-02-18 NOTE — Addendum Note (Signed)
Addended by: Cyd Silence on: 02/18/2016 01:09 PM   Modules accepted: Orders

## 2016-02-24 ENCOUNTER — Ambulatory Visit (HOSPITAL_COMMUNITY): Payer: Commercial Managed Care - HMO | Attending: Sports Medicine | Admitting: Occupational Therapy

## 2016-02-24 ENCOUNTER — Encounter (HOSPITAL_COMMUNITY): Payer: Self-pay | Admitting: Occupational Therapy

## 2016-02-24 DIAGNOSIS — M25611 Stiffness of right shoulder, not elsewhere classified: Secondary | ICD-10-CM | POA: Diagnosis not present

## 2016-02-24 DIAGNOSIS — R29898 Other symptoms and signs involving the musculoskeletal system: Secondary | ICD-10-CM | POA: Diagnosis not present

## 2016-02-24 DIAGNOSIS — M25511 Pain in right shoulder: Secondary | ICD-10-CM | POA: Insufficient documentation

## 2016-02-24 NOTE — Therapy (Addendum)
Aaron Fox, [name], [address] Phone: [phone]   Fax:  [phone]  Occupational Therapy Evaluation  Patient Details  Name: Aaron Fox MRN: [id number] Date of Birth: Jun 04, 1955 Referring Provider: Dr. Lilia Argue  Encounter Date: 02/24/2016      OT End of Session - 02/24/16 1028    Visit Number 1   Number of Visits 6   Date for OT Re-Evaluation 04/06/16  mini reassessment 03/23/2016   Authorization Type [employer] medicare   Authorization Time Period Before 10th visit   Authorization - Visit Number 1   Authorization - Number of Visits 10   OT Start Time [phone]   OT Stop Time 1019   OT Time Calculation (min) 32 min   Activity Tolerance Patient tolerated treatment well   Behavior During Therapy [facility] for tasks assessed/performed      Past Medical History:  Diagnosis Date  . ALLERGIC RHINITIS 07/02/2008  . Atrial fibrillation ([place])   . CHF 06/19/2008  . COUGH DUE TO ACE INHIBITORS 10/05/2008  . DIABETES MELLITUS, TYPE II 09/22/2006  . ED (erectile dysfunction)   . Glaucoma   . GOUT 09/22/2006  . Hx of adenomatous colonic polyps 06/09/2015  . HYPERCHOLESTEROLEMIA 07/02/2008  . HYPERTENSION 05/31/2009  . HYPOGONADISM, MALE 01/15/2007  . Hypokalemia   . 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 Lymphadenopathy   . Peripheral neuropathy ([place])   . PUD (peptic ulcer disease)   . Rheumatoid arthritis(714.0) 09/22/2006    Past Surgical History:  Procedure Laterality Date  . CATARACT EXTRACTION, BILATERAL    . COLONOSCOPY  2004  . LYMPH NODE BIOPSY    . LYMPH NODE BIOPSY Right 10/16/2014   Procedure: RIGHT INGUINAL LYMPH NODE BIOPSY;  Surgeon: Aviva Signs, MD;  Location: [facility];  Service: General;  Laterality: Right;    There were no vitals filed for this visit.      Subjective Assessment - 02/24/16 1026    Subjective  S: I don't have pain all the time but when I do it's really bad.    Pertinent History  Pt is a 60 y/o male presenting with neck and right shoulder pain due to cervical disc disorder with radiculopathy. Pt began experiencing pain approximately 3 years ago, which then ceased for approximately 1 year, and returned 3 months ago however is now more severe.  Pt was referred to occupational therapy for evaluation and treatment by Dr. Lilia Argue.    Special Tests FOTO Score: 47/100 (53% impairment)   Patient Stated Goals To have less pain in my shoulder and neck.    Currently in Pain? No/denies           [facility] OT Assessment - 02/24/16 0949      Assessment   Diagnosis Cervical disc disorder with radiculopathy to RUE   Referring Provider Dr. Lilia Argue   Onset Date 10/29/15  chronic for 2 years, better for 1 year, returned 3 mo ago   Prior Therapy None     Precautions   Precautions None     Balance Screen   Has the patient fallen in the past 6 months No   Has the patient had a decrease in activity level because of a fear of falling?  No   Is the patient reluctant to leave their home because of a fear of falling?  No     Home  Environment   Family/patient expects to be discharged to: Private residence  Prior Function   Level of Independence Independent   Vocation On disability   Leisure yardwork     ADL   ADL comments Pt is having difficulty reaching up, reaching behind back to put belt on, looking to side and up, sleeping on right side; pt is able to complete all B/IADL tasks however experiences pain during tasks     Written Expression   Dominant Hand Right     Cognition   Overall Cognitive Status Within Functional Limits for tasks assessed     ROM / Strength   AROM / PROM / Strength AROM;PROM;Strength     Palpation   Palpation comment Pt with moderate fascial restictions in right upper arm, trapezius, and scapularis regions      AROM   Overall AROM Comments Assessed seated, er/IR adducted   AROM Assessment Site Shoulder;Cervical   Right/Left  Shoulder Right   Right Shoulder Flexion 150 Degrees   Right Shoulder ABduction 159 Degrees   Right Shoulder Internal Rotation 90 Degrees   Right Shoulder External Rotation 55 Degrees   Cervical Flexion 33   Cervical Extension 60  WNL   Cervical - Right Side Bend 39  WFL   Cervical - Left Side Bend 34  WFL   Cervical - Right Rotation 76  WNL   Cervical - Left Rotation 80  WNL     PROM   Overall PROM Comments Assessed supine, er/IR adducted   PROM Assessment Site Shoulder   Right/Left Shoulder Right   Right Shoulder Flexion 155 Degrees   Right Shoulder ABduction 150 Degrees   Right Shoulder Internal Rotation 90 Degrees   Right Shoulder External Rotation 60 Degrees     Strength   Overall Strength Comments Assessed seated, er/IR adducted   Strength Assessment Site Shoulder   Right/Left Shoulder Right   Right Shoulder Flexion 4+/5   Right Shoulder ABduction 4/5   Right Shoulder Internal Rotation 4+/5   Right Shoulder External Rotation 4+/5                         OT Education - 02/24/16 1029    Education provided Yes   Education Details red scapular theraband strengthening, cervical A/ROM exercises   Person(s) Educated Patient   Methods Explanation;Demonstration;Handout   Comprehension Verbalized understanding;Returned demonstration          OT Short Term Goals - 02/24/16 1057      OT SHORT TERM GOAL #1   Title Pt will be educated on and independent in HEP.    Time 6   Period Weeks   Status New     OT SHORT TERM GOAL #2   Title Pt will decrease RUE and cervical pain to 3/10 or less to improve ability to perform ADL tasks without increased time.    Time 6   Period Weeks   Status New     OT SHORT TERM GOAL #3   Title Pt will decrease RUE fascial restrictions from mod to min amounts or less to improve mobility required for overhead reaching tasks.    Time 6   Period Weeks   Status New     OT SHORT TERM GOAL #4   Title Pt will improve RUE  A/ROM to WNL to improve ability to reach behind his back to fasten belt.    Time 6   Period Weeks   Status New     OT SHORT TERM GOAL #5   Title Pt will  improve RUE to 5/5 to increase ability to complete yardwork tasks using RUE as dominant.    Time 6   Period Weeks   Status New                  Plan - 2016/03/20 1029    Clinical Impression Statement A: Pt is a 60 y/o male presenting with right shoulder and neck pain limiting functional task performance. Pt reports he may wake up without pain, however will begin to experience severe pain at some point during the day. The pain occurs without warning and is instantly severe, numbness/tingling in RUE at times. Pt provided with cervical A/ROM and scapular theraband HEP. Pt will come 1x/week and be provided with HEP due to high copay.    Rehab Potential Good   OT Frequency 1x / week   OT Duration 6 weeks   OT Treatment/Interventions Self-care/ADL training;Therapeutic exercise;Patient/family education;Ultrasound;Manual Therapy;Cryotherapy;Therapeutic activities;Electrical Stimulation;Moist Heat;Passive range of motion;Other (comment)  manual cervical traction   Plan P: Pt will benefit from skilled OT services to decrease pain and fascial restrictions, increase ROM and endurance to improve functional use of RUE and cervical region during functional task completion. Treatment plan: myofascial release, manual therapy, P/ROM, A/ROM, general RUE strengthening, scapular stability and strengthening, gentle manual cervical traction, cervical stretches   OT Home Exercise Plan 21-Mar-2023: red scapular theraband, cervical A/ROM   Consulted and Agree with Plan of Care Patient      Patient will benefit from skilled therapeutic intervention in order to improve the following deficits and impairments:  Impaired flexibility, Decreased strength, Decreased activity tolerance, Pain, Decreased range of motion, Increased fascial restricitons, Impaired UE functional  use  Visit Diagnosis: Acute pain of right shoulder  Stiffness of right shoulder, not elsewhere classified  Other symptoms and signs involving the musculoskeletal system      G-Codes - 03/20/16 1138    Functional Assessment Tool Used FOTO Score: 47/100 (53% impairment)   Functional Limitation Carrying, moving and handling objects   Carrying, Moving and Handling Objects Current Status (V4008) At least 40 percent but less than 60 percent impaired, limited or restricted   Carrying, Moving and Handling Objects Goal Status (Q7619) At least 20 percent but less than 40 percent impaired, limited or restricted      Problem List Patient Active Problem List   Diagnosis Date Noted  . Cervical radiculitis 02/08/2016  . Hx of adenomatous colonic polyps 06/09/2015  . Dyspnea on exertion 05/03/2015  . Lymphadenopathy, retroperitoneal 09/17/2014  . PSA elevation 05/27/2014  . Iron deficiency anemia 05/27/2014  . Wellness examination 04/20/2014  . Diabetic foot ulcer ([place]) 11/21/2013  . Type 1 diabetes mellitus with neurological manifestations, uncontrolled ([place]) 10/22/2013  . Piriformis syndrome of left side 08/25/2013  . Dyspnea 07/24/2013  . Arthralgia 07/24/2013  . Routine general medical examination at a health care facility 07/24/2013  . Numbness and tingling in hands 10/14/2012  . Encounter for long-term (current) use of other medications 11/13/2011  . Disturbance of skin sensation 11/13/2011  . Screening for prostate cancer 11/13/2011  . 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 Shoulder pain, right 11/13/2011  . BALANITIS 09/01/2009  . HYPOKALEMIA 05/31/2009  . Essential hypertension 05/31/2009  . LEG PAIN, BILATERAL 02/15/2009  . HYPERCHOLESTEROLEMIA 07/02/2008  . ALLERGIC RHINITIS 07/02/2008  . CHF 06/19/2008  . MICROSCOPIC HEMATURIA 06/19/2008  . 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 Hypogonadism male 01/15/2007  . Diabetes ([place]) 09/22/2006  . Gout  09/22/2006  .  RHEUMATOID ARTHRITIS 09/22/2006   Guadelupe Sabin, OTR/L  [phone] 02/24/2016, 12:11 PM  [place], [name], [address] Phone: [phone]   Fax:  [phone]  Name: Aaron Fox MRN: [id number] Date of Birth: 02/13/1956   OCCUPATIONAL THERAPY DISCHARGE SUMMARY  Visits from Start of Care: 1  Current functional level related to goals / functional outcomes: See above   Remaining deficits: All remain. See above   Education / Equipment: See above  Patient requesting discharge at this time. Reports recent  Plan: Patient agrees to discharge.  Patient goals were not met. Patient is being discharged due to the patient's request.  ?????         Ailene Ravel, OTR/L,CBIS  [phone]

## 2016-02-24 NOTE — Patient Instructions (Signed)
Theraband exercises: Complete 1-2x/day  (Home) Extension: Isometric / Bilateral Arm Retraction - Sitting   Facing anchor, hold hands and elbow at shoulder height, with elbow bent.  Pull arms back to squeeze shoulder blades together. Repeat 10-15 times.  Copyright  VHI. All rights reserved.   (Home) Retraction: Row - Bilateral (Anchor)   Facing anchor, arms reaching forward, pull hands toward stomach, keeping elbows bent and at your sides and pinching shoulder blades together. Repeat 10-15 times.  Copyright  VHI. All rights reserved.   (Clinic) Extension / Flexion (Assist)   Face anchor, pull arms back, keeping elbow straight, and squeze shoulder blades together. Repeat 10-15 times.   Copyright  VHI. All rights reserved.     AROM: Lateral Neck Flexion   Slowly tilt head toward one shoulder, then the other.  Repeat _10___ times per set. Do __1__ sets per session. Do _1-2___ sessions per day.  http://orth.exer.us/296   Copyright  VHI. All rights reserved.  AROM: Neck Extension   Bend head backward.  Repeat _10___ times per set. Do _1___ sets per session. Do _1-2___ sessions per day.  http://orth.exer.us/300   Copyright  VHI. All rights reserved.  AROM: Neck Flexion   Bend head forward.  Repeat __10__ times per set. Do __1__ sets per session. Do _1-2___ sessions per day.  http://orth.exer.us/298   Copyright  VHI. All rights reserved.  AROM: Neck Rotation   Turn head slowly to look over one shoulder, then the other. Repeat _10___ times per set. Do __1__ sets per session. Do _1-2___ sessions per day.  http://orth.exer.us/294   Copyright  VHI. All rights reserved.

## 2016-03-01 ENCOUNTER — Encounter (HOSPITAL_COMMUNITY): Payer: Commercial Managed Care - HMO

## 2016-03-01 ENCOUNTER — Ambulatory Visit (HOSPITAL_COMMUNITY)
Admission: RE | Admit: 2016-03-01 | Discharge: 2016-03-01 | Disposition: A | Discharge status: Home / Self Care | Payer: Commercial Managed Care - HMO | Source: Ambulatory Visit | Attending: Surgery | Admitting: Surgery

## 2016-03-01 ENCOUNTER — Encounter (HOSPITAL_BASED_OUTPATIENT_CLINIC_OR_DEPARTMENT_OTHER): Payer: Medicare PPO | Attending: Surgery

## 2016-03-01 ENCOUNTER — Other Ambulatory Visit: Payer: Self-pay | Admitting: Surgery

## 2016-03-01 DIAGNOSIS — Z7982 Long term (current) use of aspirin: Secondary | ICD-10-CM | POA: Insufficient documentation

## 2016-03-01 DIAGNOSIS — E1169 Type 2 diabetes mellitus with other specified complication: Secondary | ICD-10-CM | POA: Insufficient documentation

## 2016-03-01 DIAGNOSIS — E1165 Type 2 diabetes mellitus with hyperglycemia: Secondary | ICD-10-CM | POA: Insufficient documentation

## 2016-03-01 DIAGNOSIS — E11621 Type 2 diabetes mellitus with foot ulcer: Secondary | ICD-10-CM | POA: Diagnosis present

## 2016-03-01 DIAGNOSIS — L97509 Non-pressure chronic ulcer of other part of unspecified foot with unspecified severity: Secondary | ICD-10-CM | POA: Insufficient documentation

## 2016-03-01 DIAGNOSIS — Z79899 Other long term (current) drug therapy: Secondary | ICD-10-CM | POA: Diagnosis not present

## 2016-03-01 DIAGNOSIS — M869 Osteomyelitis, unspecified: Secondary | ICD-10-CM

## 2016-03-01 DIAGNOSIS — I110 Hypertensive heart disease with heart failure: Secondary | ICD-10-CM | POA: Insufficient documentation

## 2016-03-01 DIAGNOSIS — Z794 Long term (current) use of insulin: Secondary | ICD-10-CM | POA: Insufficient documentation

## 2016-03-01 DIAGNOSIS — Z87891 Personal history of nicotine dependence: Secondary | ICD-10-CM | POA: Diagnosis not present

## 2016-03-01 DIAGNOSIS — L97512 Non-pressure chronic ulcer of other part of right foot with fat layer exposed: Secondary | ICD-10-CM | POA: Insufficient documentation

## 2016-03-01 DIAGNOSIS — M069 Rheumatoid arthritis, unspecified: Secondary | ICD-10-CM | POA: Insufficient documentation

## 2016-03-01 DIAGNOSIS — E1140 Type 2 diabetes mellitus with diabetic neuropathy, unspecified: Secondary | ICD-10-CM | POA: Diagnosis not present

## 2016-03-01 DIAGNOSIS — I509 Heart failure, unspecified: Secondary | ICD-10-CM | POA: Diagnosis not present

## 2016-03-01 DIAGNOSIS — M86371 Chronic multifocal osteomyelitis, right ankle and foot: Secondary | ICD-10-CM | POA: Diagnosis not present

## 2016-03-03 ENCOUNTER — Ambulatory Visit (HOSPITAL_COMMUNITY): Payer: Commercial Managed Care - HMO | Admitting: Occupational Therapy

## 2016-03-03 ENCOUNTER — Telehealth (HOSPITAL_COMMUNITY): Payer: Self-pay | Admitting: Occupational Therapy

## 2016-03-03 NOTE — Telephone Encounter (Signed)
Patient have a very high fever today.

## 2016-03-07 DIAGNOSIS — E11621 Type 2 diabetes mellitus with foot ulcer: Secondary | ICD-10-CM | POA: Diagnosis not present

## 2016-03-08 ENCOUNTER — Ambulatory Visit (HOSPITAL_COMMUNITY): Payer: Commercial Managed Care - HMO

## 2016-03-08 ENCOUNTER — Telehealth (HOSPITAL_COMMUNITY): Payer: Self-pay

## 2016-03-08 NOTE — Telephone Encounter (Signed)
patient is having problems with his toe.

## 2016-03-10 ENCOUNTER — Telehealth: Payer: Self-pay | Admitting: *Deleted

## 2016-03-10 ENCOUNTER — Encounter: Payer: Self-pay | Admitting: Podiatry

## 2016-03-10 ENCOUNTER — Ambulatory Visit (INDEPENDENT_AMBULATORY_CARE_PROVIDER_SITE_OTHER): Payer: Medicare PPO | Admitting: Podiatry

## 2016-03-10 VITALS — BP 185/86 | HR 95 | Temp 98.7°F | Resp 18

## 2016-03-10 DIAGNOSIS — L97913 Non-pressure chronic ulcer of unspecified part of right lower leg with necrosis of muscle: Secondary | ICD-10-CM | POA: Diagnosis not present

## 2016-03-10 DIAGNOSIS — L97522 Non-pressure chronic ulcer of other part of left foot with fat layer exposed: Secondary | ICD-10-CM | POA: Diagnosis not present

## 2016-03-10 DIAGNOSIS — R0989 Other specified symptoms and signs involving the circulatory and respiratory systems: Secondary | ICD-10-CM

## 2016-03-10 MED ORDER — CLINDAMYCIN HCL 300 MG PO CAPS: 300.0000 mg | ORAL_CAPSULE | Freq: Three times a day (TID) | ORAL | 0 refills | Status: DC

## 2016-03-10 NOTE — Telephone Encounter (Addendum)
-----   Message from Trula Slade, DPM sent at 03/10/2016  2:11 PM EST ----- Can you please order arterial studies for nonhealing ulcer and decreased pulses. Orders faxed to [facility]. Pt called states he received a handwritten prescription for an antibiotic but he doesn't want to get out of the car because it is raining, request the rx be called in to [facility]. Changed pharmacy and reordered today's antibiotic.03/31/2016-Dr. 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 Faxed orders to [facility].

## 2016-03-10 NOTE — Patient Instructions (Signed)

## 2016-03-10 NOTE — Progress Notes (Signed)
Subjective:    Patient ID: Aaron Fox, male    DOB: 1955/05/14, 61 y.o.   MRN: [id number]  HPI  61 year old male presents the office they for surgical consultation for right third toe amputation. He's had a wound to the right third toe for several weeks now. He presents to [place] treated for a blister that turned into a wound. He was initially treated by a primary care physician and was referred to the wound care center. I resected the call from Dr. Con Memos and he states that he believes that there is nothing further I can be done to help save the toe and he is to go ahead and proceed with a toe amputation and the patient is agreeable to this. He is recently finish his antibiotics and is currently not taken antibiotics. He has noticed a foul odor coming from the toe. He is also noticed a wound started to form on the side of the right fourth toe from where the third toe is rubbing. He currently denies any systemic complaints as fevers, chills, nausea, vomiting. No calf pain, chest pain, shortness of breath.  Review of Systems  All other systems reviewed and are negative.      Objective:   Physical Exam General: AAO x3, NAD; non-toxic appearing   Dermatological: There is extensive tissue loss to the right 3rd toe and an ulceration encompassing the majority of the toe. There is thick serosangenous drainage expressed from the toe. There is significant malodor coming from the toe as well. There is maceration along the 3rd interspace and there is a superficial granular ulcer present on the 4th toe. There is no edema to the 4th toe. There are no other open lesions. No fluctuance or crepitance. There is no ascending cellulitis.         Vascular: Dorsalis Pedis artery and Posterior Tibial artery pedal pulses are palpable with an immediate CRT to all toes except for the right 3rd toe.  There is no pain with calf compression, swelling, warmth, erythema.   Neruologic: Sensation decreased with  SWMF  Musculoskeletal: Hammertoes are present. . Muscular strength 5/5 in all groups tested bilateral.  Gait: Unassisted, Nonantalgic.     Assessment & Plan:  61 year old male right third toe nonhealing ulceration with infection -Treatment options discussed including all alternatives, risks, and complications -Etiology of symptoms were discussed -Chart and x-rays were reviewed.  -At this time I discussed with him Amputation of the toe versus trying to save the toe. At this point he wishes to go ahead and proceed with amputation of the toe. After discussion with Dr. Con Memos as well he felt that this is the best option. I discussed the surgery with the patient as well as potential risks. Also with the wound on the fourth toe there is concern for nonhealing to the fourth toe however hopefully that once the third toe is gone the pressure will be a limited to the fourth toe and that wound will heal. Discussed with him the potential need for further surgery, transmetatarsal amputation -Will proceed with third toe amputation right foot on Monday, 03/13/2016 -The incision placement as well as the postoperative course was discussed with the patient. I discussed risks of the surgery which include, but not limited to, infection, bleeding, pain, swelling, need for further surgery, delayed or nonhealing, painful or ugly scar, numbness or sensation changes, over/under correction, recurrence, transfer lesions, further deformity, hardware failure, DVT/PE, loss of toe/foot. Patient understands these risks and wishes to proceed with  surgery. The surgical consent was reviewed with the patient all 3 pages were signed. No promises or guarantees were given to the outcome of the procedure. All questions were answered to the best of my ability. Before the surgery the patient was encouraged to call the office if there is any further questions. The surgery will be performed at the [facility] on an outpatient basis. -Surgical shoe  dispensed -Clindamycin   Celesta Gentile, DPM

## 2016-03-13 ENCOUNTER — Encounter: Payer: Self-pay | Admitting: Podiatry

## 2016-03-13 DIAGNOSIS — L97502 Non-pressure chronic ulcer of other part of unspecified foot with fat layer exposed: Secondary | ICD-10-CM | POA: Diagnosis not present

## 2016-03-14 ENCOUNTER — Telehealth (HOSPITAL_COMMUNITY): Payer: Self-pay | Admitting: Endocrinology

## 2016-03-14 NOTE — Telephone Encounter (Signed)
03/14/16 I called about his appt on 1/17 but he said that he just had surgery and has a drs appt and will let us know if he needs to keep the other appts.

## 2016-03-15 ENCOUNTER — Encounter (HOSPITAL_COMMUNITY): Payer: Commercial Managed Care - HMO

## 2016-03-16 ENCOUNTER — Ambulatory Visit (HOSPITAL_COMMUNITY)
Admission: RE | Admit: 2016-03-16 | Discharge: 2016-03-16 | Disposition: A | Discharge status: Home / Self Care | Payer: Medicare PPO | Source: Ambulatory Visit | Attending: Podiatry | Admitting: Podiatry

## 2016-03-16 ENCOUNTER — Encounter: Payer: Medicare PPO | Admitting: Podiatry

## 2016-03-16 ENCOUNTER — Encounter: Payer: Self-pay | Admitting: Podiatry

## 2016-03-16 ENCOUNTER — Encounter (HOSPITAL_COMMUNITY): Payer: Self-pay

## 2016-03-16 ENCOUNTER — Ambulatory Visit (HOSPITAL_COMMUNITY): Admission: RE | Admit: 2016-03-16 | Payer: Medicare PPO | Source: Ambulatory Visit

## 2016-03-16 DIAGNOSIS — R0989 Other specified symptoms and signs involving the circulatory and respiratory systems: Secondary | ICD-10-CM | POA: Insufficient documentation

## 2016-03-16 NOTE — Progress Notes (Signed)
Subjective: Aaron Fox is a 61 y.o. is seen today in office s/p left 3rd toe amputation preformed on 03/13/16. He states that he is not having any pain. He presents today for surgical shoe. He is not taking any pain medicine. He has continue with antibiotics. Denies any systemic complaints such as fevers, chills, nausea, vomiting. No calf pain, chest pain, shortness of breath.   He presents to the office today with is wife. The office was closed due to weather however he still came in. I was at the office doing work, so I went ahead and saw him.   Objective: General: No acute distress, AAOx3  DP/PT pulses palpable 2/4 Protective sensation intact. Motor function intact.  Left foot: Incision is well coapted without any evidence of dehiscence and sutures and drain are intact. There is no surrounding erythema, ascending cellulitis, fluctuance, crepitus, malodor, drainage/purulence. There is minimal edema around the surgical site. There is no pain along the surgical site. There is macerated tissue but his is actually improved compared to prior to surgery.  No other areas of tenderness to bilateral lower extremities.  No other open lesions or pre-ulcerative lesions.  No pain with calf compression, swelling, warmth, erythema.   Assessment and Plan:  Status post left 3rd toe amputation, doing well with no complications   -Treatment options discussed including all alternatives, risks, and complications -Dressing was changed today as well as the drain been removed. Betadine ointment was applied followed by dry sterile dressing. Keep clean, dry, intact. -Continue surgical shoe. -Elevation -Finish course of antibiotics.  -Pain medication as needed. He has not been needing this however.  -Monitor for any clinical signs or symptoms of infection and DVT/PE and directed to call the office immediately should any occur or go to the ER. -Follow-up in 1 week or sooner if any problems arise. In the  meantime, encouraged to call the office with any questions, concerns, change in symptoms.   *x-ray next appointment   Celesta Gentile, DPM

## 2016-03-17 ENCOUNTER — Encounter: Payer: Medicare PPO | Admitting: Podiatry

## 2016-03-18 NOTE — Progress Notes (Signed)
Subjective:    Patient ID: Aaron Fox, male    DOB: Feb 24, 1956, 61 y.o.   MRN: [id number]  HPI Pt returns for f/u of diabetes mellitus: DM type: 1 Dx'ed: 123XX123 Complications: polyneuropathy, retinopathy, PAD, and nephropathy Therapy: insulin since 2003 DKA: never Severe hypoglycemia: never Pancreatitis: never.  Other: he is on a bid insulin regimen, after poor results with multiple daily injections.   Interval history: Pt says he does not miss the insulin.  Pt says he has mild hypoglycemia approx twice per week.  no cbg record, but states cbg's are lowest in the middle of the night.  He recently had amputation of right 3rd toe.  Past Medical History:  Diagnosis Date  . ALLERGIC RHINITIS 07/02/2008  . Atrial fibrillation ([place])   . CHF 06/19/2008  . COUGH DUE TO ACE INHIBITORS 10/05/2008  . DIABETES MELLITUS, TYPE II 09/22/2006  . ED (erectile dysfunction)   . Glaucoma   . GOUT 09/22/2006  . Hx of adenomatous colonic polyps 06/09/2015  . HYPERCHOLESTEROLEMIA 07/02/2008  . HYPERTENSION 05/31/2009  . HYPOGONADISM, MALE 01/15/2007  . Hypokalemia   . 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 Lymphadenopathy   . Peripheral neuropathy ([place])   . PUD (peptic ulcer disease)   . Rheumatoid arthritis(714.0) 09/22/2006    Past Surgical History:  Procedure Laterality Date  . CATARACT EXTRACTION, BILATERAL    . COLONOSCOPY  2004  . LYMPH NODE BIOPSY    . LYMPH NODE BIOPSY Right 10/16/2014   Procedure: RIGHT INGUINAL LYMPH NODE BIOPSY;  Surgeon: Aviva Signs, MD;  Location: [facility];  Service: General;  Laterality: Right;    Social History   Social History  . Marital status: Married    Spouse name: N/A  . Number of children: 3  . Years of education: N/A   Occupational History  . [employer]ability [employer]   Social History Main Topics  . Smoking status: Former Smoker    Quit date: 11/02/1999  . Smokeless tobacco: Never Used  . Alcohol use No  . Drug use: No  . Sexual activity: Not  on file   Other Topics Concern  . Not on file   Social History Narrative   Regular exercise-yes    Current Outpatient Prescriptions on File Prior to Visit  Medication Sig Dispense Refill  . ACCU-CHEK FASTCLIX LANCETS MISC Use to check blood sugar 2 times per day. 204 each 2  . Alcohol Swabs (B-D SINGLE USE SWABS REGULAR) PADS Use to check blood sugar two times per day. 200 each 2  . aspirin 81 MG tablet Take 81 mg by mouth daily.      . BD INSULIN SYRINGE ULTRAFINE 31G X 5/16" 0.5 ML MISC     . clindamycin (CLEOCIN) 300 MG capsule Take 1 capsule (300 mg total) by mouth 3 (three) times daily. 30 capsule 0  . gabapentin (NEURONTIN) 300 MG capsule Take 1 capsule (300 mg total) by mouth 2 (two) times daily. 60 capsule 2  . insulin regular (NOVOLIN R RELION) 100 units/mL injection Inject 0.12 mLs (12 Units total) into the skin daily with supper. And syringes 2/day 10 mL 11  . INSULIN SYRINGE 1CC/29G 29G X 1/2" 1 ML MISC Use to inject insulin 4 times per day 360 each 1   No current facility-administered medications on file prior to visit.     Allergies  Allergen Reactions  . Penicillins Swelling    Swelling of the hands   . Terbinafine Hcl Hives  .  Zestril [Lisinopril] Cough    Family History  Problem Relation Age of Onset  . Heart disease Other     FH of CAD  . Colon polyps Paternal Uncle   . Kidney disease Paternal Uncle   . Diabetes Paternal Grandfather   . Diabetes Maternal Uncle   . Cancer Neg Hx   . Colon cancer Neg Hx   . Gallbladder disease Neg Hx   . Esophageal cancer Neg Hx     BP 132/82   Pulse 93   Ht 5\' 11"  (1.803 m)   Wt 229 lb (103.9 kg)   SpO2 92%   BMI 31.94 kg/m    Review of Systems Denies LOC.      Objective:   Physical Exam VITAL SIGNS:  See vs page GENERAL: no distress Pulses: left dorsalis pedis intact.   MSK: no deformity of the left foot.   CV: trace left leg edema.  Skin:  normal color and temp on the left foot.  No ulcer on the left  foot.  Neuro: sensation is intact to touch on the left foot, but decreased from normal.  Ext: There is left foot onychomycosis of the toenails. Right foot is bandaged    Assessment & Plan:  Noncompliance with cbg recording, persistent PAD: new: this increases the risk of CAD, so he should minimize hypoglycemia. Type 1 DM, with retinopathy: overcontrolled, given this regimen, which does match insulin to his changing needs throughout the day.   Patient is advised the following: Patient Instructions  Please reduce the NPH insulin to 55 units each morning.   Please continue the same reg insulin. check your blood sugar twice a day.  vary the time of day when you check, between before the 3 meals, and at bedtime.  also check if you have symptoms of your blood sugar being too high or too low.  please keep a record of the readings and bring it to your next appointment here (or you can bring the meter itself).  You can write it on any piece of paper.  please call us sooner if your blood sugar goes below 70, or if you have a lot of readings over 200.  Please come back for a regular physical appointment in 1 month.

## 2016-03-20 ENCOUNTER — Encounter: Payer: Self-pay | Admitting: Podiatry

## 2016-03-21 ENCOUNTER — Encounter: Payer: Self-pay | Admitting: Endocrinology

## 2016-03-21 ENCOUNTER — Ambulatory Visit (INDEPENDENT_AMBULATORY_CARE_PROVIDER_SITE_OTHER): Payer: Medicare PPO | Admitting: Endocrinology

## 2016-03-21 VITALS — BP 132/82 | HR 93 | Ht 71.0 in | Wt 229.0 lb

## 2016-03-21 DIAGNOSIS — Z794 Long term (current) use of insulin: Secondary | ICD-10-CM

## 2016-03-21 DIAGNOSIS — E11319 Type 2 diabetes mellitus with unspecified diabetic retinopathy without macular edema: Secondary | ICD-10-CM

## 2016-03-21 LAB — POCT GLYCOSYLATED HEMOGLOBIN (HGB A1C): HEMOGLOBIN A1C: 6.8

## 2016-03-21 MED ORDER — LOSARTAN POTASSIUM 50 MG PO TABS: 50.0000 mg | ORAL_TABLET | Freq: Every day | ORAL | 3 refills | Status: DC

## 2016-03-21 MED ORDER — ATORVASTATIN CALCIUM 10 MG PO TABS: 10.0000 mg | ORAL_TABLET | Freq: Every day | ORAL | 3 refills | Status: DC

## 2016-03-21 MED ORDER — GLUCOSE BLOOD VI STRP: 1.0000 | ORAL_STRIP | Freq: Two times a day (BID) | 2 refills | Status: DC

## 2016-03-21 MED ORDER — INSULIN NPH (HUMAN) (ISOPHANE) 100 UNIT/ML ~~LOC~~ SUSP: 55.0000 [IU] | SUBCUTANEOUS | 11 refills | Status: DC

## 2016-03-21 NOTE — Patient Instructions (Addendum)
Please reduce the NPH insulin to 55 units each morning.   Please continue the same reg insulin. check your blood sugar twice a day.  vary the time of day when you check, between before the 3 meals, and at bedtime.  also check if you have symptoms of your blood sugar being too high or too low.  please keep a record of the readings and bring it to your next appointment here (or you can bring the meter itself).  You can write it on any piece of paper.  please call us sooner if your blood sugar goes below 70, or if you have a lot of readings over 200.  Please come back for a regular physical appointment in 1 month.

## 2016-03-22 ENCOUNTER — Ambulatory Visit (HOSPITAL_COMMUNITY): Payer: Commercial Managed Care - HMO | Attending: Sports Medicine

## 2016-03-22 ENCOUNTER — Telehealth (HOSPITAL_COMMUNITY): Payer: Self-pay

## 2016-03-22 NOTE — Telephone Encounter (Signed)
Called patient regarding no show. Patient states that he forgot to call and cancel. He recently had surgery on his foot and he is having a hard time getting around. Next appointment was cancelled. As of right now, patient is scheduled for 04/05/16 and he will call if he is unable to attend.   Ailene Ravel, OTR/L,CBIS  [phone]

## 2016-03-23 ENCOUNTER — Encounter: Payer: Self-pay | Admitting: Podiatry

## 2016-03-24 ENCOUNTER — Other Ambulatory Visit: Payer: Self-pay

## 2016-03-24 ENCOUNTER — Ambulatory Visit (INDEPENDENT_AMBULATORY_CARE_PROVIDER_SITE_OTHER): Payer: Self-pay | Admitting: Podiatry

## 2016-03-24 ENCOUNTER — Encounter: Payer: Self-pay | Admitting: Podiatry

## 2016-03-24 ENCOUNTER — Ambulatory Visit (INDEPENDENT_AMBULATORY_CARE_PROVIDER_SITE_OTHER): Payer: Medicare PPO

## 2016-03-24 VITALS — Temp 97.7°F

## 2016-03-24 DIAGNOSIS — Z9889 Other specified postprocedural states: Secondary | ICD-10-CM | POA: Diagnosis not present

## 2016-03-24 DIAGNOSIS — L97913 Non-pressure chronic ulcer of unspecified part of right lower leg with necrosis of muscle: Secondary | ICD-10-CM

## 2016-03-24 DIAGNOSIS — Z899 Acquired absence of limb, unspecified: Secondary | ICD-10-CM

## 2016-03-24 MED ORDER — INSULIN NPH (HUMAN) (ISOPHANE) 100 UNIT/ML ~~LOC~~ SUSP: 55.0000 [IU] | SUBCUTANEOUS | 2 refills | Status: DC

## 2016-03-24 MED ORDER — CLINDAMYCIN HCL 300 MG PO CAPS: 300.0000 mg | ORAL_CAPSULE | Freq: Three times a day (TID) | ORAL | 0 refills | Status: DC

## 2016-03-24 NOTE — Progress Notes (Signed)
Subjective: Aaron Fox is a 61 y.o. is seen today in office s/p left 3rd toe amputation preformed on 03/13/16. He states that he is not having any pain. He has noticed some drainage coming from the incision but denies any pus. He finished his course of Antibiotics. He has noticed a slight malodor coming from the incision. Denies any systemic complaints such as fevers, chills, nausea, vomiting. No calf pain, chest pain, shortness of breath.   Objective: General: No acute distress, AAOx3  DP/PT pulses palpable 2/4 Protective sensation intact. Motor function intact.  Left foot: Incision is well coapted without any evidence of dehiscence and sutures are intact. There is no wound dehiscence. There is continued but somewhat improved macerated tissue along the incision site. It does reveal mild malodor but  this appears to be from the bandage which does have serosanguinous drainage. The wound on the 4th toe is better. Small amount of macerated tissue is present on the fourth toe. There is minimal edema and there is no erythema or ascending cellulitis of the foot. No other open lesions or pre-ulcerative lesions.  No pain with calf compression, swelling, warmth, erythema.   Assessment and Plan:  Status post left 3rd toe amputation,  -Treatment options discussed including all alternatives, risks, and complications -X-rays were obtained and reviewed. No evidence of acute fracture.Status post toe amputation. -Betadine dressing applto the incision. Can change this every other day. I gave him gauze to bandage the area. -Continue surgical shoe. -Elevation -Will restart clindamycin. This was prescribed today.   -Monitor for any clinical signs or symptoms of infection and DVT/PE and directed to call the office immediately should any occur or go to the ER. -Follow-up in 1 week or sooner if any problems arise. In the meantime, encouraged to call the office with any questions, concerns, change in symptoms.    Celesta Gentile, DPM

## 2016-03-29 ENCOUNTER — Telehealth (HOSPITAL_COMMUNITY): Payer: Self-pay | Admitting: Endocrinology

## 2016-03-29 ENCOUNTER — Ambulatory Visit (HOSPITAL_COMMUNITY): Payer: Commercial Managed Care - HMO

## 2016-03-29 NOTE — Telephone Encounter (Signed)
03/29/16  Pt left a message asking that we cancel all appointments.  He has something going on with his foot and needs to get that taken care of first.

## 2016-03-30 ENCOUNTER — Other Ambulatory Visit: Payer: Self-pay

## 2016-03-30 MED ORDER — INSULIN NPH (HUMAN) (ISOPHANE) 100 UNIT/ML ~~LOC~~ SUSP: 55.0000 [IU] | SUBCUTANEOUS | 2 refills | Status: DC

## 2016-03-31 ENCOUNTER — Ambulatory Visit (INDEPENDENT_AMBULATORY_CARE_PROVIDER_SITE_OTHER): Payer: Medicare PPO

## 2016-03-31 ENCOUNTER — Encounter: Payer: Self-pay | Admitting: Podiatry

## 2016-03-31 ENCOUNTER — Ambulatory Visit (INDEPENDENT_AMBULATORY_CARE_PROVIDER_SITE_OTHER): Payer: Self-pay | Admitting: Podiatry

## 2016-03-31 DIAGNOSIS — Z899 Acquired absence of limb, unspecified: Secondary | ICD-10-CM

## 2016-03-31 NOTE — Progress Notes (Signed)
Subjective: Aaron Fox is a 61 y.o. is seen today in office s/p RIGHT 3rd toe amputation preformed on 03/13/16. He states that he is not having any pain but has still been having some drainage. He is still on clindamycin. He has been using betadine to the incisions every other day. Denies any swelling or redness. Denies any systemic complaints such as fevers, chills, nausea, vomiting. No calf pain, chest pain, shortness of breath.   Objective: General: No acute distress, AAOx3  DP/PT pulses palpable 2/4 Protective sensation intact. Motor function intact.  Left foot: Incision with mild dehiscence and maceration around the incision and onto the 4th toe. This maceration was going on prior to the surgery.  Superficial granular wound along the medial portion of the fourth toe with macerated periwound. There is no drainage or pus expressed. Mild edema to the surgical site. No erythema or increase in warmth or ascending cellulitis. No other open lesions or pre-ulcerative lesions.  No pain with calf compression, swelling, warmth, erythema.   Assessment and Plan:  Status post left 3rd toe amputation, maceration along the wound  -Treatment options discussed including all alternatives, risks, and complications -X-rays were obtained and reviewed. No evidence of acute fracture. Status post toe amputation. Swelling in the 3rd metatarsal noted.  -Sutures removed today as they were not holding the central portion the incision. Continued Betadine dressing changes for now I ordered a calcium alginate dressing changes did be performed at home. This is ordered today through [facility].  -Continue surgical shoe. -Elevation -Continue clindamycin  -Monitor for any clinical signs or symptoms of infection and DVT/PE and directed to call the office immediately should any occur or go to the ER. -Follow-up in 1 week or sooner if any problems arise. In the meantime, encouraged to call the office with any questions,  concerns, change in symptoms.   Celesta Gentile, DPM

## 2016-04-03 ENCOUNTER — Telehealth: Payer: Self-pay | Admitting: Internal Medicine

## 2016-04-03 ENCOUNTER — Other Ambulatory Visit: Payer: Self-pay | Admitting: Endocrinology

## 2016-04-03 NOTE — Telephone Encounter (Signed)
Will await request. 

## 2016-04-03 NOTE — Telephone Encounter (Signed)
Mcarth[facility] Rossetti is sending a request a request for patient diabetic supplies,and the atorvastatin (LIPITOR) 10 MG tablet

## 2016-04-04 ENCOUNTER — Telehealth: Payer: Self-pay | Admitting: *Deleted

## 2016-04-04 MED ORDER — CLINDAMYCIN HCL 300 MG PO CAPS: 300.0000 mg | ORAL_CAPSULE | Freq: Three times a day (TID) | ORAL | 0 refills | Status: DC

## 2016-04-04 NOTE — Telephone Encounter (Signed)
Pt left DOB and phone number. 04/04/2016-I spoke with pt and he said Dr. Jacqualyn Posey had wanted him to continue the antibiotic. I reviewed 03/31/2016 clinicals and Dr. Jacqualyn Posey noted he wanted pt to continue the clindamycin. Informed pt I would sent rx to [facility].

## 2016-04-05 ENCOUNTER — Ambulatory Visit (HOSPITAL_COMMUNITY): Payer: Self-pay

## 2016-04-05 ENCOUNTER — Other Ambulatory Visit: Payer: Self-pay

## 2016-04-05 MED ORDER — ACCU-CHEK FASTCLIX LANCETS MISC: 2 refills | Status: DC

## 2016-04-05 MED ORDER — "BD INSULIN SYRINGE ULTRAFINE 31G X 5/16"" 0.5 ML MISC": 5 refills | Status: DC

## 2016-04-05 MED ORDER — ATORVASTATIN CALCIUM 10 MG PO TABS: 10.0000 mg | ORAL_TABLET | Freq: Every day | ORAL | 3 refills | Status: DC

## 2016-04-05 MED ORDER — GLUCOSE BLOOD VI STRP: 1.0000 | ORAL_STRIP | Freq: Two times a day (BID) | 2 refills | Status: DC

## 2016-04-05 NOTE — Progress Notes (Signed)
DOS 01.15.2018 Right foot third (3rd) toe amputation, possible removal of part of metatarsal (knuckle bone behind toe)

## 2016-04-07 ENCOUNTER — Encounter: Payer: Self-pay | Admitting: Podiatry

## 2016-04-07 ENCOUNTER — Ambulatory Visit (INDEPENDENT_AMBULATORY_CARE_PROVIDER_SITE_OTHER): Payer: Self-pay | Admitting: Podiatry

## 2016-04-07 VITALS — Temp 99.7°F

## 2016-04-07 DIAGNOSIS — Z899 Acquired absence of limb, unspecified: Secondary | ICD-10-CM

## 2016-04-07 DIAGNOSIS — Z9889 Other specified postprocedural states: Secondary | ICD-10-CM

## 2016-04-09 NOTE — Progress Notes (Signed)
Subjective: Aaron Fox is a 61 y.o. is seen today in office s/p RIGHT 3rd toe amputation preformed on 03/13/16. He has not noticed any smell and the drainage has been minimal. He has continued with daily dressing changes.  Denies any swelling or redness. Denies any systemic complaints such as fevers, chills, nausea, vomiting. No calf pain, chest pain, shortness of breath.   Objective: General: No acute distress, AAOx3  DP/PT pulses palpable 2/4 Protective sensation intact. Motor function intact.  Left foot: Incision with mild dehiscence however the wound appears to be granular. The wound is much improved. There is still some macerated tissue along the medial aspect of the 4th toe. No drainage or pus today.  No erythema or increase in warmth or ascending cellulitis. No other open lesions or pre-ulcerative lesions.  No pain with calf compression, swelling, warmth, erythema.   Assessment and Plan:  Status post left 3rd toe amputation, maceration along the wound  -Treatment options discussed including all alternatives, risks, and complications -Wound is much improved today. Continue calcium alginate dressing to the wound daily.  -Continue surgical shoe. -Elevation -Finish course of clindamycin  -Monitor for any clinical signs or symptoms of infection and DVT/PE and directed to call the office immediately should any occur or go to the ER. -Follow-up in 2 weeks or sooner if any problems arise. In the meantime, encouraged to call the office with any questions, concerns, change in symptoms.   *x-ray next appointment   Celesta Gentile, DPM

## 2016-04-12 ENCOUNTER — Ambulatory Visit (HOSPITAL_COMMUNITY): Payer: Self-pay

## 2016-04-19 ENCOUNTER — Ambulatory Visit (HOSPITAL_COMMUNITY): Payer: Self-pay

## 2016-04-21 ENCOUNTER — Telehealth: Payer: Self-pay | Admitting: *Deleted

## 2016-04-21 NOTE — Telephone Encounter (Addendum)
Pt states Dr. Jacqualyn Posey had wanted him to state on his antibiotic. I reviewed pt's LOV 04/07/2016, Dr. Jacqualyn Posey had stated in his Plan pt was to finish his course of Clindamycin. I spoke with pt and informed of Dr. Leigh Aurora Plan orders and pt stated understanding. I asked pt how the foot looked and he said it was coming along. I told pt to call with concerns.05/12/2016-Pt states he needs refill of antibiotics, but the cipro breaks him out. Dr. Jacqualyn Posey states just refill Clindamycin. I informed pt he would only be taking the Clindamycin and it had been sent to [facility]. Left message for with Dr. Leigh Aurora review of the pathology, and explained I thought it was important that he would want to know before the weekend.

## 2016-04-28 ENCOUNTER — Encounter: Payer: Self-pay | Admitting: Podiatry

## 2016-04-28 ENCOUNTER — Ambulatory Visit (INDEPENDENT_AMBULATORY_CARE_PROVIDER_SITE_OTHER): Payer: Medicare PPO | Admitting: Podiatry

## 2016-04-28 ENCOUNTER — Ambulatory Visit (INDEPENDENT_AMBULATORY_CARE_PROVIDER_SITE_OTHER): Payer: Medicare PPO | Admitting: Endocrinology

## 2016-04-28 ENCOUNTER — Encounter: Payer: Self-pay | Admitting: Endocrinology

## 2016-04-28 ENCOUNTER — Ambulatory Visit (INDEPENDENT_AMBULATORY_CARE_PROVIDER_SITE_OTHER): Payer: Medicare PPO

## 2016-04-28 VITALS — BP 122/64 | HR 92 | Ht 71.0 in | Wt 222.0 lb

## 2016-04-28 VITALS — BP 131/79 | HR 91 | Resp 18

## 2016-04-28 DIAGNOSIS — Z9889 Other specified postprocedural states: Secondary | ICD-10-CM

## 2016-04-28 DIAGNOSIS — Z0000 Encounter for general adult medical examination without abnormal findings: Secondary | ICD-10-CM | POA: Diagnosis not present

## 2016-04-28 DIAGNOSIS — E1065 Type 1 diabetes mellitus with hyperglycemia: Secondary | ICD-10-CM | POA: Diagnosis not present

## 2016-04-28 DIAGNOSIS — M868X7 Other osteomyelitis, ankle and foot: Secondary | ICD-10-CM

## 2016-04-28 DIAGNOSIS — E1042 Type 1 diabetes mellitus with diabetic polyneuropathy: Secondary | ICD-10-CM

## 2016-04-28 DIAGNOSIS — IMO0002 Reserved for concepts with insufficient information to code with codable children: Secondary | ICD-10-CM

## 2016-04-28 LAB — LIPID PANEL
Cholesterol: 108 mg/dL (ref 0–200)
HDL: 27 mg/dL — AB (ref >39.00)
LDL Cholesterol: 60 mg/dL (ref 0–99)
NONHDL: 81.21
Total CHOL/HDL Ratio: 4
Triglycerides: 105 mg/dL (ref 0.0–149.0)
VLDL: 21 mg/dL (ref 0.0–40.0)

## 2016-04-28 LAB — BASIC METABOLIC PANEL
BUN: 20 mg/dL (ref 6–23)
CALCIUM: 9.3 mg/dL (ref 8.4–10.5)
CO2: 23 meq/L (ref 19–32)
CREATININE: 1.13 mg/dL (ref 0.40–1.50)
Chloride: 106 mEq/L (ref 96–112)
GFR: 85.11 mL/min (ref >60.00)
Glucose, Bld: 99 mg/dL (ref 70–99)
Potassium: 4.5 mEq/L (ref 3.5–5.1)
Sodium: 138 mEq/L (ref 135–145)

## 2016-04-28 LAB — CBC WITH DIFFERENTIAL/PLATELET
Basophils Absolute: 0.1 10*3/uL (ref 0.0–0.1)
Basophils Relative: 1.1 % (ref 0.0–3.0)
EOS ABS: 0.5 10*3/uL (ref 0.0–0.7)
Eosinophils Relative: 7.3 % — ABNORMAL HIGH (ref 0.0–5.0)
HCT: 30.5 % — ABNORMAL LOW (ref 39.0–52.0)
Hemoglobin: 10.3 g/dL — ABNORMAL LOW (ref 13.0–17.0)
LYMPHS ABS: 2 10*3/uL (ref 0.7–4.0)
Lymphocytes Relative: 29.2 % (ref 12.0–46.0)
MCHC: 33.7 g/dL (ref 30.0–36.0)
MCV: 80.5 fl (ref 78.0–100.0)
Monocytes Absolute: 0.8 10*3/uL (ref 0.1–1.0)
Monocytes Relative: 11.4 % (ref 3.0–12.0)
NEUTROS ABS: 3.5 10*3/uL (ref 1.4–7.7)
NEUTROS PCT: 51 % (ref 43.0–77.0)
PLATELETS: 282 10*3/uL (ref 150.0–400.0)
RBC: 3.78 Mil/uL — ABNORMAL LOW (ref 4.22–5.81)
RDW: 13.7 % (ref 11.5–15.5)
WBC: 6.8 10*3/uL (ref 4.0–10.5)

## 2016-04-28 LAB — URINALYSIS, ROUTINE W REFLEX MICROSCOPIC
Bilirubin Urine: NEGATIVE
Ketones, ur: NEGATIVE
LEUKOCYTES UA: NEGATIVE
Nitrite: NEGATIVE
PH: 6 (ref 5.0–8.0)
SPECIFIC GRAVITY, URINE: 1.025 (ref 1.000–1.030)
TOTAL PROTEIN, URINE-UPE24: 100 — AB
URINE GLUCOSE: NEGATIVE
UROBILINOGEN UA: 0.2 (ref 0.0–1.0)

## 2016-04-28 LAB — HEPATIC FUNCTION PANEL
ALBUMIN: 3.5 g/dL (ref 3.5–5.2)
ALK PHOS: 108 U/L (ref 39–117)
ALT: 18 U/L (ref 0–53)
AST: 22 U/L (ref 0–37)
Bilirubin, Direct: 0.1 mg/dL (ref 0.0–0.3)
TOTAL PROTEIN: 7.8 g/dL (ref 6.0–8.3)
Total Bilirubin: 0.4 mg/dL (ref 0.2–1.2)

## 2016-04-28 LAB — PSA: PSA: 5.6 ng/mL — AB (ref 0.10–4.00)

## 2016-04-28 LAB — TSH: TSH: 1.63 u[IU]/mL (ref 0.35–4.50)

## 2016-04-28 MED ORDER — CIPROFLOXACIN HCL 500 MG PO TABS: 500.0000 mg | ORAL_TABLET | Freq: Two times a day (BID) | ORAL | 0 refills | Status: DC

## 2016-04-28 MED ORDER — INSULIN REGULAR HUMAN 100 UNIT/ML IJ SOLN: 7.0000 [IU] | Freq: Every day | INTRAMUSCULAR | 11 refills | Status: DC

## 2016-04-28 MED ORDER — CLINDAMYCIN HCL 300 MG PO CAPS: 300.0000 mg | ORAL_CAPSULE | Freq: Three times a day (TID) | ORAL | 0 refills | Status: DC

## 2016-04-28 NOTE — Patient Instructions (Addendum)
Please consider these measures for your health:  minimize alcohol.  Do not use tobacco products.  Have a colonoscopy at least every 10 years from age 60.  Keep firearms safely stored.  Always use seat belts.  have working smoke alarms in your home.  See an eye doctor and dentist regularly.  Never drive under the influence of alcohol or drugs (including prescription drugs).   blood tests are requested for you today.  We'll let you know about the results.  Please continue the same NPH insulin, and: Reduce the regular to 7 units with supper.  Please come back for a follow-up appointment in 3 months.

## 2016-04-28 NOTE — Progress Notes (Addendum)
Subjective: 61 year old male presents the office for follow-up evaluation status post right third toe amputation. He states he still has some mild swelling but has not changed. He says that overall the wound to the fourth toe is healed incision site is mostly healed except for small area on the top of the foot. He has notany drainage or pus and denies any systemic complaints such as fevers, chills, nausea, vomiting. No acute changes since last appointment, and no other complaints at this time.   Objective: AAO x3, NAD DP/PT pulses palpable bilaterally, CRT less than 3 seconds Incision appears be well coapted distally however on the very proximal dorsal aspect of the incision with a drain was continues to be an area of a wound which her granulation tissue is present. After debridement there is a sinus tract going directly down to metatarsal probing. No drainage or pus expressed into her small amount of bloody drainage after debridement of the area. There is minimal edema to the foot and there is no significant erythema or ascending cellulitis. There is no questions, crepitus, malodor.  No open lesions or pre-ulcerative lesions.  No pain with calf compression, swelling, warmth, erythema  Assessment: Right third metatarsal osteomyelitis  Plan: -All treatment options discussed with the patient including all alternatives, risks, complications.  -X-rays were obtained and reviewed which does reveal osteomy as of the third metatarsal. -We'll start clindamycin ciprofloxacin. -Wound culture obtained.  -At this time is discussing further surgical intervention to remove more the metatarsal. Under same wrist no locations he wished proceed with this we'll try to do this next week -The incision placement as well as the postoperative course was discussed with the patient. I discussed risks of the surgery which include, but not limited to, infection, bleeding, pain, swelling, need for further surgery, delayed or  nonhealing, painful or ugly scar, numbness or sensation changes, over/under correction, recurrence, transfer lesions, further deformity, hardware failure, DVT/PE, loss of toe/foot. Patient understands these risks and wishes to proceed with surgery. The surgical consent was reviewed with the patient all 3 pages were signed. No promises or guarantees were given to the outcome of the procedure. All questions were answered to the best of my ability. Before the surgery the patient was encouraged to call the office if there is any further questions. The surgery will be performed at the [facility] on an outpatient basis. -Patient encouraged to call the office with any questions, concerns, change in symptoms.   Celesta Gentile, DPM

## 2016-04-28 NOTE — Patient Instructions (Signed)

## 2016-04-28 NOTE — Progress Notes (Signed)
Subjective:    Patient ID: Aaron Fox, male    DOB: [birth year], 61 y.o.   MRN: [id number]  HPI Pt is here for regular wellness examination, and is feeling pretty well in general, and says chronic med probs are stable, except as noted below Past Medical History:  Diagnosis Date  . ALLERGIC RHINITIS 07/02/2008  . Atrial fibrillation ([place])   . CHF 06/19/2008  . COUGH DUE TO ACE INHIBITORS 10/05/2008  . DIABETES MELLITUS, TYPE II 09/22/2006  . ED (erectile dysfunction)   . Glaucoma   . GOUT 09/22/2006  . Hx of adenomatous colonic polyps 06/09/2015  . HYPERCHOLESTEROLEMIA 07/02/2008  . HYPERTENSION 05/31/2009  . HYPOGONADISM, MALE 01/15/2007  . Hypokalemia   . 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 Lymphadenopathy   . Peripheral neuropathy ([place])   . PUD (peptic ulcer disease)   . Rheumatoid arthritis(714.0) 09/22/2006    Past Surgical History:  Procedure Laterality Date  . CATARACT EXTRACTION, BILATERAL    . COLONOSCOPY  2004  . LYMPH NODE BIOPSY    . LYMPH NODE BIOPSY Right 10/16/2014   Procedure: RIGHT INGUINAL LYMPH NODE BIOPSY;  Surgeon: Aviva Signs, MD;  Location: [facility];  Service: General;  Laterality: Right;    Social History   Social History  . Marital status: Married    Spouse name: N/A  . Number of children: 3  . Years of education: N/A   Occupational History  . Diability [employer]   Social History Main Topics  . Smoking status: Former Smoker    Quit date: 11/02/1999  . Smokeless tobacco: Never Used  . Alcohol use No  . Drug use: No  . Sexual activity: Not on file   Other Topics Concern  . Not on file   Social History Narrative   Regular exercise-yes    Current Outpatient Prescriptions on File Prior to Visit  Medication Sig Dispense Refill  . ACCU-CHEK FASTCLIX LANCETS MISC Use to check blood sugar 2 times per day. 204 each 2  . Alcohol Swabs (B-D SINGLE USE SWABS REGULAR) PADS Use to check blood sugar two times per day. 200 each 2  . aspirin  81 MG tablet Take 81 mg by mouth daily.      Marland Kitchen atorvastatin (LIPITOR) 10 MG tablet Take 1 tablet (10 mg total) by mouth daily. 90 tablet 3  . BD INSULIN SYRINGE ULTRAFINE 31G X 5/16" 0.5 ML MISC Use with insulin. 100 each 5  . gabapentin (NEURONTIN) 300 MG capsule Take 1 capsule (300 mg total) by mouth 2 (two) times daily. 60 capsule 2  . glucose blood (ACCU-CHEK SMARTVIEW) test strip 1 each by Other route 2 (two) times daily. And lancets 2/day 200 each 2  . insulin NPH Human (HUMULIN N,NOVOLIN N) 100 UNIT/ML injection Inject 0.55 mLs (55 Units total) into the skin every morning. Reported on 04/01/2015 50 mL 2  . losartan (COZAAR) 50 MG tablet Take 1 tablet (50 mg total) by mouth daily. 90 tablet 3  . MONOJECT ULTRA COMFORT SYRINGE 29G X 1/2" 1 ML MISC USE TO INJECT INSULIN 4 TIMES PER DAY 400 each 3   No current facility-administered medications on file prior to visit.     Allergies  Allergen Reactions  . Penicillins Swelling    Swelling of the hands   . Terbinafine Hcl Hives  . Zestril [Lisinopril] Cough    Family History  Problem Relation Age of Onset  . Heart disease Other  FH of CAD  . Colon polyps Paternal Uncle   . Kidney disease Paternal Uncle   . Diabetes Paternal Grandfather   . Diabetes Maternal Uncle   . Cancer Neg Hx   . Colon cancer Neg Hx   . Gallbladder disease Neg Hx   . Esophageal cancer Neg Hx     BP 122/64   Pulse 92   Ht 5\' 11"  (1.803 m)   Wt 222 lb (100.7 kg)   SpO2 97%   BMI 30.96 kg/m     Review of Systems Constitutional: Negative for fever.   HENT: Negative for hearing loss.   Eyes: Negative for visual disturbance.  Respiratory: Negative for shortness of breath.   Cardiovascular: Negative for chest pain.  Gastrointestinal: Negative for anal bleeding.  Endocrine: Negative for cold intolerance.  Genitourinary: Negative for hematuria and difficulty urinating.  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    Skin: Negative for rash.  Allergic/Immunologic: positive for environmental allergies.  Neurological: Negative for headaches.  Hematological: Does not bruise/bleed easily.  Psychiatric/Behavioral: Negative for dysphoric mood.     Objective:   Physical Exam VS: see vs page GEN: no distress HEAD: head: no deformity eyes: no periorbital swelling, no proptosis external nose and ears are normal mouth: no lesion seen NECK: supple, thyroid is not enlarged CHEST WALL: no deformity LUNGS: clear to auscultation BREASTS:  No gynecomastia CV: reg rate and rhythm, no murmur ABD: abdomen is soft, nontender.  no hepatosplenomegaly.  not distended.  no hernia RECTAL: normal external and internal exam.  heme neg. PROSTATE:  Normal size.  No nodule MUSCULOSKELETAL: muscle bulk and strength are grossly normal.  no obvious joint swelling.  gait is normal and steady PULSES: no carotid bruit NEURO:  cn 2-12 grossly intact.   readily moves all 4's.  SKIN:  Normal texture and temperature.  No rash or suspicious lesion is visible.   NODES:  None palpable at the neck PSYCH: alert, well-oriented.  Does not appear anxious nor depressed.        Assessment & Plan:     SEPARATE EVALUATION FOLLOWS--EACH PROBLEM HERE IS NEW, NOT RESPONDING TO TREATMENT, OR POSES SIGNIFICANT RISK TO THE PATIENT'S HEALTH: HISTORY OF THE PRESENT ILLNESS: Pt returns for f/u of diabetes mellitus: DM type: 1 Dx'ed: [id number] Complications: polyneuropathy, retinopathy, PAD, and nephropathy Therapy: insulin since 2003 DKA: never Severe hypoglycemia: never Pancreatitis: never.  Other: he is on a bid insulin regimen, after poor results with multiple daily injections.   Interval history: no cbg record, but he has mild hypoglycemia at hs PAST MEDICAL HISTORY Past Medical History:  Diagnosis Date  . ALLERGIC RHINITIS 07/02/2008  . Atrial fibrillation ([place])   . CHF 06/19/2008  . COUGH DUE TO ACE INHIBITORS 10/05/2008  . DIABETES  MELLITUS, TYPE II 09/22/2006  . ED (erectile dysfunction)   . Glaucoma   . GOUT 09/22/2006  . Hx of adenomatous colonic polyps 06/09/2015  . HYPERCHOLESTEROLEMIA 07/02/2008  . HYPERTENSION 05/31/2009  . HYPOGONADISM, MALE 01/15/2007  . Hypokalemia   . 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 Lymphadenopathy   . Peripheral neuropathy ([place])   . PUD (peptic ulcer disease)   . Rheumatoid arthritis(714.0) 09/22/2006    Past Surgical History:  Procedure Laterality Date  . CATARACT EXTRACTION, BILATERAL    . COLONOSCOPY  2004  . LYMPH NODE BIOPSY    . LYMPH NODE BIOPSY Right 10/16/2014   Procedure: RIGHT INGUINAL LYMPH NODE BIOPSY;  Surgeon:  Aviva Signs, MD;  Location: [facility];  Service: General;  Laterality: Right;    Social History   Social History  . Marital status: Married    Spouse name: N/A  . Number of children: 3  . Years of education: N/A   Occupational History  . Diability [employer]   Social History Main Topics  . Smoking status: Former Smoker    Quit date: 11/02/1999  . Smokeless tobacco: Never Used  . Alcohol use No  . Drug use: No  . Sexual activity: Not on file   Other Topics Concern  . Not on file   Social History Narrative   Regular exercise-yes    Current Outpatient Prescriptions on File Prior to Visit  Medication Sig Dispense Refill  . ACCU-CHEK FASTCLIX LANCETS MISC Use to check blood sugar 2 times per day. 204 each 2  . Alcohol Swabs (B-D SINGLE USE SWABS REGULAR) PADS Use to check blood sugar two times per day. 200 each 2  . aspirin 81 MG tablet Take 81 mg by mouth daily.      Marland Kitchen atorvastatin (LIPITOR) 10 MG tablet Take 1 tablet (10 mg total) by mouth daily. 90 tablet 3  . BD INSULIN SYRINGE ULTRAFINE 31G X 5/16" 0.5 ML MISC Use with insulin. 100 each 5  . gabapentin (NEURONTIN) 300 MG capsule Take 1 capsule (300 mg total) by mouth 2 (two) times daily. 60 capsule 2  . glucose blood (ACCU-CHEK SMARTVIEW) test strip 1 each by Other route 2 (two) times  daily. And lancets 2/day 200 each 2  . insulin NPH Human (HUMULIN N,NOVOLIN N) 100 UNIT/ML injection Inject 0.55 mLs (55 Units total) into the skin every morning. Reported on 04/01/2015 50 mL 2  . losartan (COZAAR) 50 MG tablet Take 1 tablet (50 mg total) by mouth daily. 90 tablet 3  . MONOJECT ULTRA COMFORT SYRINGE 29G X 1/2" 1 ML MISC USE TO INJECT INSULIN 4 TIMES PER DAY 400 each 3   No current facility-administered medications on file prior to visit.     Allergies  Allergen Reactions  . Penicillins Swelling    Swelling of the hands   . Terbinafine Hcl Hives  . Zestril [Lisinopril] Cough    Family History  Problem Relation Age of Onset  . Heart disease Other     FH of CAD  . Colon polyps Paternal Uncle   . Kidney disease Paternal Uncle   . Diabetes Paternal Grandfather   . Diabetes Maternal Uncle   . Cancer Neg Hx   . Colon cancer Neg Hx   . Gallbladder disease Neg Hx   . Esophageal cancer Neg Hx     BP 122/64   Pulse 92   Ht 5\' 11"  (1.803 m)   Wt 222 lb (100.7 kg)   SpO2 97%   BMI 30.96 kg/m   REVIEW OF SYSTEMS: He has lost weight, due to his efforts PHYSICAL EXAMINATION: VITAL SIGNS:  See vs page GENERAL: no distress Pulses: left dorsalis pedis is intact MSK: no deformity of the left foot CV: trace left leg edema Skin:  no ulcer on the left foot.  normal color and temp on the left foot Neuro: sensation is intact to touch on the left foot, but severely decreased from normal.   EXT: right foot is bandaged.  There is bilateral onychomycosis of the left foot toenails LAB/XRAY RESULTS: Lab Results  Component Value Date   HGBA1C 6.8 03/21/2016  IMPRESSION: Insulin-requiring type 2 DM: overcontrolled,  given this regimen, which does match insulin to his changing needs throughout the day.  PLAN:  Please continue the same NPH insulin, and: Reduce the regular to 7 units with supper

## 2016-04-29 LAB — HEPATITIS C ANTIBODY: HCV Ab: NEGATIVE

## 2016-04-29 LAB — HIV ANTIBODY (ROUTINE TESTING W REFLEX): HIV: NONREACTIVE

## 2016-05-01 LAB — WOUND CULTURE
GRAM STAIN: NONE SEEN
Gram Stain: NONE SEEN
Gram Stain: NONE SEEN
ORGANISM ID, BACTERIA: NORMAL

## 2016-05-03 DIAGNOSIS — M86371 Chronic multifocal osteomyelitis, right ankle and foot: Secondary | ICD-10-CM | POA: Diagnosis not present

## 2016-05-08 ENCOUNTER — Ambulatory Visit (INDEPENDENT_AMBULATORY_CARE_PROVIDER_SITE_OTHER): Payer: Medicare PPO

## 2016-05-08 ENCOUNTER — Ambulatory Visit (INDEPENDENT_AMBULATORY_CARE_PROVIDER_SITE_OTHER): Payer: Self-pay | Admitting: Podiatry

## 2016-05-08 VITALS — Temp 97.6°F

## 2016-05-08 DIAGNOSIS — Z899 Acquired absence of limb, unspecified: Secondary | ICD-10-CM | POA: Diagnosis not present

## 2016-05-08 DIAGNOSIS — L97522 Non-pressure chronic ulcer of other part of left foot with fat layer exposed: Secondary | ICD-10-CM | POA: Diagnosis not present

## 2016-05-08 DIAGNOSIS — L97913 Non-pressure chronic ulcer of unspecified part of right lower leg with necrosis of muscle: Secondary | ICD-10-CM

## 2016-05-08 DIAGNOSIS — M868X7 Other osteomyelitis, ankle and foot: Secondary | ICD-10-CM

## 2016-05-08 NOTE — Patient Instructions (Signed)
Continue antibiotics. If you run out before we call you with the results from the culture in surgery please  Call us to refill.

## 2016-05-09 ENCOUNTER — Encounter: Payer: Self-pay | Admitting: Podiatry

## 2016-05-09 NOTE — Progress Notes (Signed)
Subjective: Denilson CASIMIRO LIENHARD is a 61 y.o. is seen today in office s/p right 3rd metatarsal excision preformed on 05/03/2016 due to osteomyelitis. He presents today for POV 1 and to have the drain removed. They state their pain is minimal and he is not taking pain medication. He has remained in the surgical shoe. Denies any systemic complaints such as fevers, chills, nausea, vomiting. No calf pain, chest pain, shortness of breath.   Objective: General: No acute distress, AAOx3  DP/PT pulses palpable 2/4, CRT < 3 sec to all digits.  Protective sensation intact. Motor function intact.  Right foot: Incision is well coapted without any evidence of dehiscence and suture are intact. Penrose drain in place. There is no surrounding erythema, ascending cellulitis, fluctuance, crepitus, malodor, drainage/purulence. There is mildedema around the surgical site. There is no pain along the surgical site.  No other areas of tenderness to bilateral lower extremities.  No other open lesions or pre-ulcerative lesions.  No pain with calf compression, swelling, warmth, erythema.   Assessment and Plan:  Status post right foot surgery, doing well with no complications   -Treatment options discussed including all alternatives, risks, and complications -X-rays were obtained and reviewed with the patient. S/p 3rd metatarsal excision, drain in place. Possible fracture to the 4th metatarsal. Discussed with the patient.  -Continue with surgical shoe.  -Drain removed in total -Antibiotic ointment applied followed by a dry sterile dressing -Elevation -Continue antibiotics- await culture. If he runs out of antibiotic before we get the culture back will refill. Otherwise will tailor antibiotics to the culture results.  -Pain medication as needed- he has not been taking -Monitor for any clinical signs or symptoms of infection and DVT/PE and directed to call the office immediately should any occur or go to the ER. -Follow-up  in 1 week or sooner if any problems arise. In the meantime, encouraged to call the office with any questions, concerns, change in symptoms.   Celesta Gentile, DPM

## 2016-05-12 MED ORDER — CLINDAMYCIN HCL 300 MG PO CAPS: 300.0000 mg | ORAL_CAPSULE | Freq: Three times a day (TID) | ORAL | 0 refills | Status: DC

## 2016-05-12 NOTE — Telephone Encounter (Signed)
-----   Message from Trula Slade, DPM sent at 05/11/2016  9:58 PM EDT ----- Please let him know that the pathology did not show any remaining bone infection in the clean bone I sent to the lab.

## 2016-05-15 ENCOUNTER — Ambulatory Visit (INDEPENDENT_AMBULATORY_CARE_PROVIDER_SITE_OTHER): Payer: Self-pay | Admitting: Podiatry

## 2016-05-15 DIAGNOSIS — Z9889 Other specified postprocedural states: Secondary | ICD-10-CM

## 2016-05-15 DIAGNOSIS — L97913 Non-pressure chronic ulcer of unspecified part of right lower leg with necrosis of muscle: Secondary | ICD-10-CM

## 2016-05-15 NOTE — Progress Notes (Signed)
Subjective: Akash ULYESS MUTO is a 61 y.o. is seen today in office s/p right 3rd metatarsal excision preformed on 05/03/2016 due to osteomyelitis. He presents today for POV 2 for possible suture removal. He has continue the clindamycin. He called and we stopped cipro due to a reaction (rash) but he found out this only happens if he takes the 2 antibiotics together. He has remained the surgical shoe. Denies any systemic complaints such as fevers, chills, nausea, vomiting. No calf pain, chest pain, shortness of breath.   Objective: General: No acute distress, AAOx3  DP/PT pulses palpable 2/4, CRT < 3 sec to all digits.  Protective sensation intact. Motor function intact.  Right foot: Incision is well coapted without any evidence of dehiscence and suture are intact however they do not appear to be holding the incision. There is movement across the incision. There is no surrounding erythema, ascending cellulitis, fluctuance, crepitus, malodor, drainage/purulence. There is mild edema around the surgical site however there appears to be much improvement in the swelling compared to before surgery. There is no pain along the surgical site.  No other areas of tenderness to bilateral lower extremities.  No other open lesions or pre-ulcerative lesions.  No pain with calf compression, swelling, warmth, erythema.   Assessment and Plan:  Status post right foot surgery  -Treatment options discussed including all alternatives, risks, and complications -Sutures were removed today without incident as they were not holding much of the incision. Steri-strips were applied followed by antibiotic ointment and a bandage. Keep clean, dry, intact.  -Continue surgical shoe.  -Finish course of antibiotic  -Elevation -Monitor for any clinical signs or symptoms of infection and DVT/PE and directed to call the office immediately should any occur or go to the ER. -Follow-up in 1 week or sooner if any problems arise. In the  meantime, encouraged to call the office with any questions, concerns, change in symptoms.   Celesta Gentile, DPM

## 2016-05-16 ENCOUNTER — Other Ambulatory Visit: Payer: Medicare PPO

## 2016-05-16 ENCOUNTER — Other Ambulatory Visit (INDEPENDENT_AMBULATORY_CARE_PROVIDER_SITE_OTHER): Payer: Medicare PPO

## 2016-05-16 DIAGNOSIS — D509 Iron deficiency anemia, unspecified: Secondary | ICD-10-CM | POA: Diagnosis not present

## 2016-05-16 LAB — FECAL OCCULT BLOOD, IMMUNOCHEMICAL: Fecal Occult Bld: NEGATIVE

## 2016-05-22 ENCOUNTER — Ambulatory Visit (INDEPENDENT_AMBULATORY_CARE_PROVIDER_SITE_OTHER): Payer: Medicare PPO

## 2016-05-22 ENCOUNTER — Encounter: Payer: Self-pay | Admitting: Podiatry

## 2016-05-22 ENCOUNTER — Ambulatory Visit (INDEPENDENT_AMBULATORY_CARE_PROVIDER_SITE_OTHER): Payer: Self-pay | Admitting: Podiatry

## 2016-05-22 DIAGNOSIS — Z9889 Other specified postprocedural states: Secondary | ICD-10-CM

## 2016-05-22 DIAGNOSIS — L97913 Non-pressure chronic ulcer of unspecified part of right lower leg with necrosis of muscle: Secondary | ICD-10-CM

## 2016-05-22 NOTE — Progress Notes (Addendum)
Subjective: Aaron Fox is a 61 y.o. is seen today in office s/p right 3rd metatarsal excision preformed on 05/03/2016 due to osteomyelitis. He states he is doing better. He is continuing with antibiotics he finishes him later this week. He states that he has not noticed any pus. The wound overall is healing although there is a slight opening in the incision. He's remain in the surgical shoe was well. Denies any systemic complaints such as fevers, chills, nausea, vomiting. No calf pain, chest pain, shortness of breath.   Objective: General: No acute distress, AAOx3  DP/PT pulses palpable 2/4, CRT < 3 sec to all digits.  Protective sensation intact. Motor function intact.  Right foot: Incision is well coapted without any evidence of dehiscence along the distal incision however along the proximal incision with a drain was located is a small opening measuring about 0.4 x 0.2 x 1 cm. there is no probing to bone in the wound appears to be healing from the inside out. There is no drainage or pus expressed. There is mild hyperkeratotic tissue along the incision. Upon debridement there was no further underlying ulceration and small amount of bloody drainage was expressed. There is no erythema or increase in warmth. Overall his swelling to his foot is much improved compared to what it was last appointment and even better compared to prior to surgery. No other areas of tenderness to bilateral lower extremities.  No other open lesions or pre-ulcerative lesions.  No pain with calf compression, swelling, warmth, erythema.   Assessment and Plan:  Status post right foot surgery  -Treatment options discussed including all alternatives, risks, and complications -Hyperkeratotic tissue/wound was sharply debrided today to healthy tissue. Continue in about ointment dressing changes in the day. -Finish course of antibiotics. -Continue surgical shoe.  -Elevation -Monitor for any clinical signs or symptoms of  infection and DVT/PE and directed to call the office immediately should any occur or go to the ER. -Follow-up in 2 weeks or sooner if any problems arise. In the meantime, encouraged to call the office with any questions, concerns, change in symptoms.   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  Wound Culture 05/03/16- No growth in 2 days (final)  Celesta Gentile, DPM

## 2016-05-22 NOTE — Patient Instructions (Signed)
Finish antibiotics Continue with a small amount of antibiotic ointment and a bandage over the wound. Monitor for any signs/symptoms of infection. Call the office immediately if any occur or go directly to the emergency room. Call with any questions/concerns.

## 2016-06-05 ENCOUNTER — Encounter: Payer: Medicare PPO | Admitting: Podiatry

## 2016-06-05 ENCOUNTER — Ambulatory Visit: Payer: Medicare PPO

## 2016-06-05 DIAGNOSIS — Z9889 Other specified postprocedural states: Secondary | ICD-10-CM

## 2016-06-07 NOTE — Progress Notes (Signed)
No show

## 2016-06-12 ENCOUNTER — Ambulatory Visit (INDEPENDENT_AMBULATORY_CARE_PROVIDER_SITE_OTHER): Payer: Medicare PPO

## 2016-06-12 ENCOUNTER — Ambulatory Visit (INDEPENDENT_AMBULATORY_CARE_PROVIDER_SITE_OTHER): Payer: Medicare PPO | Admitting: Podiatry

## 2016-06-12 ENCOUNTER — Encounter: Payer: Self-pay | Admitting: Podiatry

## 2016-06-12 DIAGNOSIS — Z9889 Other specified postprocedural states: Secondary | ICD-10-CM | POA: Diagnosis not present

## 2016-06-13 NOTE — Progress Notes (Signed)
Subjective: Aaron Fox is a 61 y.o. is seen today in office s/p right 3rd metatarsal excision preformed on 05/03/2016 due to osteomyelitis. He states he is doing better. He states that he missed his last appointment because he got his days mixed up. He states he is doing well. He continues with the surgical shoe. He states that overall he is doing well. He gets some minimal swelling but no redness or warmth. There is no pus. He has developed a callus over the surgical site. Denies any systemic complaints such as fevers, chills, nausea, vomiting. No calf pain, chest pain, shortness of breath.   Objective: General: No acute distress, AAOx3  DP/PT pulses palpable 2/4, CRT < 3 sec to all digits.  Protective sensation intact. Motor function intact.  Right foot: Incision is well coapted without any evidence and the incision is closed today. There is hyperkeratotic tissue over the incision but after debridement there was no underlying ulcer and the incision is healed. There is mild continued swelling. There is no erythema or increase in warmth. There is no drainage or pus.  No other areas of tenderness to bilateral lower extremities.  No other open lesions or pre-ulcerative lesions.  No pain with calf compression, swelling, warmth, erythema.   Assessment and Plan:  Status post right foot surgery, incision/wound has healed.   -Treatment options discussed including all alternatives, risks, and complications -Hyperkeratotic tissue/wound was sharply debrided today to healthy tissue. The underlying ulcer and incision has healed.  -He can start to transition back to a diabetic shoe as able.   -Elevation -Monitor for any clinical signs or symptoms of infection and DVT/PE and directed to call the office immediately should any occur or go to the ER. -Follow-up in 3 weeks or sooner if any problems arise. In the meantime, encouraged to call the office with any questions, concerns, change in symptoms.    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  Wound Culture 05/03/16- No growth in 2 days (final)  Celesta Gentile, DPM

## 2016-06-19 ENCOUNTER — Ambulatory Visit: Payer: Medicare PPO | Admitting: Podiatry

## 2016-06-26 ENCOUNTER — Other Ambulatory Visit: Payer: Self-pay | Admitting: Podiatry

## 2016-06-26 ENCOUNTER — Other Ambulatory Visit: Payer: Self-pay | Admitting: *Deleted

## 2016-06-26 MED ORDER — CLINDAMYCIN HCL 300 MG PO CAPS: 300.0000 mg | ORAL_CAPSULE | Freq: Three times a day (TID) | ORAL | 0 refills | Status: DC

## 2016-06-26 MED ORDER — CLINDAMYCIN HCL 300 MG PO CAPS: 300.0000 mg | ORAL_CAPSULE | Freq: Three times a day (TID) | ORAL | 2 refills | Status: DC

## 2016-06-26 NOTE — Telephone Encounter (Signed)
Patient called this am and stated over the weekend the top of his foot had some clear fluid draining and stated that he had no chills or fever and that he had an appointment on may 7. Per Dr Jacqualyn Posey called in an antibiotic to pharmacy and stated to call with any concerns or questions. Lattie Haw

## 2016-06-26 NOTE — Progress Notes (Signed)
Patient called stating that he had clear drainage coming from the top of his foot over the weekend. Denies any pus or increase in swelling. Recommended an appointment today but he cannot come. Will restart clindamycin and was sent to the pharmacy. He has an appointment next Monday but strongly encouraged him to come to the office ASAP if there is any worsening or go to the ER.  Cranford Mon, CMA discussed with the patient via phone.

## 2016-07-03 ENCOUNTER — Ambulatory Visit (INDEPENDENT_AMBULATORY_CARE_PROVIDER_SITE_OTHER): Payer: Medicare PPO | Admitting: Podiatry

## 2016-07-03 ENCOUNTER — Ambulatory Visit (INDEPENDENT_AMBULATORY_CARE_PROVIDER_SITE_OTHER): Payer: Medicare PPO

## 2016-07-03 ENCOUNTER — Encounter: Payer: Self-pay | Admitting: Podiatry

## 2016-07-03 VITALS — BP 169/87

## 2016-07-03 DIAGNOSIS — M868X7 Other osteomyelitis, ankle and foot: Secondary | ICD-10-CM

## 2016-07-03 DIAGNOSIS — R609 Edema, unspecified: Secondary | ICD-10-CM

## 2016-07-03 DIAGNOSIS — Z9889 Other specified postprocedural states: Secondary | ICD-10-CM

## 2016-07-03 MED ORDER — CLINDAMYCIN HCL 300 MG PO CAPS: 300.0000 mg | ORAL_CAPSULE | Freq: Three times a day (TID) | ORAL | 0 refills | Status: DC

## 2016-07-03 NOTE — Progress Notes (Signed)
Subjective: 61 year old male presents the office today for concerns of clear drainage to his right foot. He states after a couple days after his last appointment with me he started noticing drainage. He states that the skin was foot started to peel as well. He does not feel of the drainage and does not believe that it is coming from the surgical site is coming from the skin from what skin is peeled back. Denies any pus. He states that it does not hurt and he has no pain. He is continued surgical shoe. Denies any systemic complaints such as fevers, chills, nausea, vomiting. No acute changes since last appointment, and no other complaints at this time.   Objective: AAO x3, NAD DP/PT pulses palpable bilaterally, CRT less than 3 seconds Incision the surgery is heel there is no opening present. There is dry skin present the dorsal aspect of the foot and a large amount of the skin has come off and there is new pink skin present in the dorsal aspect of the foot. There is minimal swelling. There is no areas of fluctuance or crepitus there is no malodor. Unable to appreciate any drainage or pus coming from the foot today. There is no warmth of the foot. There is no malodor. No other open lesions or pre-ulcerative lesions identified today. There is no pain with calf compression, swelling, warmth, erythema.  Assessment: 61 year old male with right foot drainage, swelling  Plan: -Treatment options discussed including all alternatives, risks, and complications -Etiology of symptoms were discussed -X-rays were obtained and reviewed with the patient. s/p amputation with solid margins and no evidence of acute osteomyelitis. No soft tissue emphysema.  -Unna boot was applied today. Discussed he can remove this in 5 days or sooner if needed. If he has any swelling, discoloration or pain he can go ahead and remove the before the 5 days. -Continue clindamycin until next appointment. Refilled today -Monitor for any  clinical signs or symptoms of infection and directed to call the office immediately should any occur or go to the ER. -RTC 1 week or sooner if needed.  Celesta Gentile, DPM

## 2016-07-05 ENCOUNTER — Other Ambulatory Visit: Payer: Self-pay

## 2016-07-05 MED ORDER — GABAPENTIN 300 MG PO CAPS: 300.0000 mg | ORAL_CAPSULE | Freq: Two times a day (BID) | ORAL | 2 refills | Status: DC

## 2016-07-05 MED ORDER — ACCU-CHEK NANO SMARTVIEW W/DEVICE KIT: PACK | 1 refills | Status: DC

## 2016-07-05 MED ORDER — GLUCOSE BLOOD VI STRP: 1.0000 | ORAL_STRIP | Freq: Two times a day (BID) | 2 refills | Status: DC

## 2016-07-06 ENCOUNTER — Other Ambulatory Visit: Payer: Self-pay

## 2016-07-14 ENCOUNTER — Ambulatory Visit (INDEPENDENT_AMBULATORY_CARE_PROVIDER_SITE_OTHER): Payer: Medicare PPO

## 2016-07-14 ENCOUNTER — Ambulatory Visit (INDEPENDENT_AMBULATORY_CARE_PROVIDER_SITE_OTHER): Payer: Self-pay | Admitting: Podiatry

## 2016-07-14 ENCOUNTER — Encounter: Payer: Self-pay | Admitting: Podiatry

## 2016-07-14 VITALS — BP 154/81 | HR 73 | Resp 16

## 2016-07-14 DIAGNOSIS — L03115 Cellulitis of right lower limb: Secondary | ICD-10-CM | POA: Diagnosis not present

## 2016-07-14 MED ORDER — DOXYCYCLINE HYCLATE 100 MG PO TABS
100.0000 mg | ORAL_TABLET | Freq: Two times a day (BID) | ORAL | 0 refills | Status: DC
Start: 2016-07-14 — End: 2016-07-31

## 2016-07-17 NOTE — Progress Notes (Signed)
Subjective: 61 year old male presents the office today for follow-up evaluation of clear drainage to his right foot. He states the pain to the toes or the foot and he feels that the surgery itself is doing well but he has noticed drainage and purulent of the skin on top of the foot. He states he is doing about the same as he was last appointment. Denies any pus. He has continued surgical shoe. Denies any systemic complaints such as fevers, chills, nausea, vomiting. No acute changes since last appointment, and no other complaints at this time.   Objective: AAO x3, NAD DP/PT pulses palpable bilaterally, CRT less than 3 seconds Incision the surgery is heel there is no opening present. On the dorsal aspect of the foot there is epidermal lysis present clear serous drainage present. There is no pus. There is no erythema or ascending cellulitis. This no fluctuance, crepitus, malodor. There is mild edema to the foot. No other open lesions or pre-ulcerative lesions identified today. There is no pain with calf compression, swelling, warmth, erythema.  Assessment: 61 year old male with right foot drainage, swelling  Plan: -Treatment options discussed including all alternatives, risks, and complications -Etiology of symptoms were discussed -Recommended Betadine wet-to-dry dressing changes daily. -Continue antibiotics -Recheck blood work.  -Discussed the symptoms continue possible MRI to rule out abscess although unusual presentation. -Symptoms worsening centimeters emergency room. -Monitor for any clinical signs or symptoms of infection and directed to call the office immediately should any occur or go to the ER.  Celesta Gentile, DPM

## 2016-07-21 ENCOUNTER — Encounter: Payer: Self-pay | Admitting: Podiatry

## 2016-07-21 ENCOUNTER — Ambulatory Visit (INDEPENDENT_AMBULATORY_CARE_PROVIDER_SITE_OTHER): Payer: Self-pay | Admitting: Podiatry

## 2016-07-21 DIAGNOSIS — L02611 Cutaneous abscess of right foot: Secondary | ICD-10-CM

## 2016-07-25 ENCOUNTER — Telehealth: Payer: Self-pay | Admitting: *Deleted

## 2016-07-25 DIAGNOSIS — L02611 Cutaneous abscess of right foot: Secondary | ICD-10-CM

## 2016-07-25 DIAGNOSIS — Z01812 Encounter for preprocedural laboratory examination: Secondary | ICD-10-CM

## 2016-07-25 NOTE — Telephone Encounter (Addendum)
-----  Message from Trula Slade, DPM sent at 07/25/2016  7:35 AM EDT ----- Can you please order an MRI of the right foot to rule out abscess; with contrast if able. Thanks. Orders given to D. [facility]ows for [employer].08/01/2016-Carolyn - [facility] states pt wants to switch to [facility], because he lives there, she has changed in the computer, but pt will need pre-cert for [employer]. [place] CRITERIA IS MET FOR [id number] OF RIGHT FOOT, REFERENCE# [id number], PRIOR AUTHORIZATION CODE# [id number], VALID 08/01/2016 - 08/30/2016. Faxed orders for MRI 17356 right foot to [facility] central scheduling. Left message for pt Scheduling [phone], and to call me with concerns.

## 2016-07-25 NOTE — Progress Notes (Signed)
Subjective: 61 year old male presents the office today for follow-up evaluation of clear drainage to his right foot. He states that he was doing better for the first couple of days and this is why he did not come this appointment on Monday. He states the foot does much better when he keeps no dressing on top of it had only he is a dressing area does continue to drain and the skin peel. He denies any increase in warmth or any red streaks. Overall he states he feels very well at the skin wasn't draining he would be "perfect". He is remain in the surgical shoe. He is continuing with Betadine dressing changes to his foot. Denies any systemic complaints such as fevers, chills, nausea, vomiting. No acute changes since last appointment, and no other complaints at this time.   Objective: AAO x3, NAD DP/PT pulses palpable bilaterally, CRT less than 3 seconds Incision the surgery is heel there is no opening present. There is peeling skin present the dorsal aspect of the patient's right foot. Some mild clear drainage is present overall this has improved. There is no warmth there is no ascending synovitis. There is no area of fluctuance or crepitus. There is no malodor. His foot has the appearance of like a venous drainage bike on the leg however this is on the foot. No other open lesions or pre-ulcerative lesions identified today. There is no pain with calf compression, swelling, warmth, erythema.  Assessment: 61 year old male with right foot drainage, swelling which continues however somewhat improved.  Plan: -Treatment options discussed including all alternatives, risks, and complications -At this point I have recommended MRI just to rule out an underlying abscess or infection as this is an unusual appearance the foot. -Finish course of antibiotics -Continue daily dressing changes. Recommended Betadine wet-to-dry dressing but he can with the area uncovered at home or at night to allow to dry up as he states  this does better. -If symptoms worsen or any other signs of infections to go medially to the emergency room and verbally understood. -Follow-up in 1 week or sooner if needed.  Celesta Gentile, DPM

## 2016-07-31 ENCOUNTER — Ambulatory Visit (INDEPENDENT_AMBULATORY_CARE_PROVIDER_SITE_OTHER): Payer: Medicare PPO | Admitting: Endocrinology

## 2016-07-31 VITALS — BP 138/76 | HR 72 | Ht 71.0 in | Wt 213.6 lb

## 2016-07-31 DIAGNOSIS — E138 Other specified diabetes mellitus with unspecified complications: Secondary | ICD-10-CM | POA: Diagnosis not present

## 2016-07-31 LAB — POCT GLYCOSYLATED HEMOGLOBIN (HGB A1C): Hemoglobin A1C: 5.5

## 2016-07-31 MED ORDER — INSULIN NPH (HUMAN) (ISOPHANE) 100 UNIT/ML ~~LOC~~ SUSP: 40.0000 [IU] | SUBCUTANEOUS | 2 refills | Status: DC

## 2016-07-31 MED ORDER — TRIAMCINOLONE ACETONIDE 0.1 % EX CREA: 1.0000 "application " | TOPICAL_CREAM | Freq: Three times a day (TID) | CUTANEOUS | 2 refills | Status: DC

## 2016-07-31 NOTE — Patient Instructions (Addendum)
Please consider these measures for your health:  minimize alcohol.  Do not use tobacco products.  Have a colonoscopy at least every 10 years from age 60.  Keep firearms safely stored.  Always use seat belts.  have working smoke alarms in your home.  See an eye doctor and dentist regularly.  Never drive under the influence of alcohol or drugs (including prescription drugs).   Please reduce the NPH insulin to 40 units each morning, and: Please continue the same regular, with supper.  I have sent a prescription to your pharmacy, for a skin cream Because we don't know the exact cause, you should use the "jock itch" cream along with it.   Please come back for a follow-up appointment in 3 months.

## 2016-07-31 NOTE — Progress Notes (Signed)
Subjective:    Patient ID: Aaron Fox, male    DOB: [birth year], 61 y.o.   MRN: [id number]  HPI Pt returns for f/u of diabetes mellitus: DM type: 1 Dx'ed: 2395 Complications: polyneuropathy, retinopathy, PAD, and nephropathy Therapy: insulin since 2003 DKA: never Severe hypoglycemia: never Pancreatitis: never.  Other: he is on a regimen of 2 QD insulins, after poor results with multiple daily injections.   Interval history: no cbg record, but he has frequent mild hypoglycemia in the afternoon.  pt states he feels well in general.  Past Medical History:  Diagnosis Date  . ALLERGIC RHINITIS 07/02/2008  . Atrial fibrillation ([place])   . CHF 06/19/2008  . COUGH DUE TO ACE INHIBITORS 10/05/2008  . DIABETES MELLITUS, TYPE II 09/22/2006  . ED (erectile dysfunction)   . Glaucoma   . GOUT 09/22/2006  . Hx of adenomatous colonic polyps 06/09/2015  . HYPERCHOLESTEROLEMIA 07/02/2008  . HYPERTENSION 05/31/2009  . HYPOGONADISM, MALE 01/15/2007  . Hypokalemia   . 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 Lymphadenopathy   . Peripheral neuropathy   . PUD (peptic ulcer disease)   . Rheumatoid arthritis(714.0) 09/22/2006    Past Surgical History:  Procedure Laterality Date  . CATARACT EXTRACTION, BILATERAL    . COLONOSCOPY  2004  . LYMPH NODE BIOPSY    . LYMPH NODE BIOPSY Right 10/16/2014   Procedure: RIGHT INGUINAL LYMPH NODE BIOPSY;  Surgeon: Aviva Signs, MD;  Location: [facility];  Service: General;  Laterality: Right;    Social History   Social History  . Marital status: Married    Spouse name: N/A  . Number of children: 3  . Years of education: N/A   Occupational History  . [employer]   Social History Main Topics  . Smoking status: Former Smoker    Quit date: 11/02/1999  . Smokeless tobacco: Never Used  . Alcohol use No  . Drug use: No  . Sexual activity: Not on file   Other Topics Concern  . Not on file   Social History Narrative   Regular exercise-yes     Current Outpatient Prescriptions on File Prior to Visit  Medication Sig Dispense Refill  . ACCU-CHEK FASTCLIX LANCETS MISC Use to check blood sugar 2 times per day. 204 each 2  . Alcohol Swabs (B-D SINGLE USE SWABS REGULAR) PADS Use to check blood sugar two times per day. 200 each 2  . aspirin 81 MG tablet Take 81 mg by mouth daily.      Marland Kitchen atorvastatin (LIPITOR) 10 MG tablet Take 1 tablet (10 mg total) by mouth daily. 90 tablet 3  . BD INSULIN SYRINGE ULTRAFINE 31G X 5/16" 0.5 ML MISC Use with insulin. 100 each 5  . Blood Glucose Monitoring Suppl (ACCU-CHEK NANO SMARTVIEW) w/Device KIT Use to test blood sugar daily 1 kit 1  . gabapentin (NEURONTIN) 300 MG capsule Take 1 capsule (300 mg total) by mouth 2 (two) times daily. 60 capsule 2  . glucose blood (ACCU-CHEK SMARTVIEW) test strip 1 each by Other route 2 (two) times daily. And lancets 2/day 200 each 2  . insulin regular (NOVOLIN R RELION) 250 units/2.23m (100 units/mL) injection Inject 0.07 mLs (7 Units total) into the skin daily with supper. And syringes 2/day 10 mL 11  . losartan (COZAAR) 50 MG tablet Take 1 tablet (50 mg total) by mouth daily. 90 tablet 3  . MONOJECT ULTRA COMFORT SYRINGE 29G X 1/2" 1 ML MISC USE TO INJECT INSULIN  4 TIMES PER DAY 400 each 3   No current facility-administered medications on file prior to visit.     Allergies  Allergen Reactions  . Penicillins Swelling    Swelling of the hands   . Terbinafine Hcl Hives  . Zestril [Lisinopril] Cough  . Ciprofloxacin Rash    Family History  Problem Relation Age of Onset  . Heart disease Other        FH of CAD  . Colon polyps Paternal Uncle   . Kidney disease Paternal Uncle   . Diabetes Paternal Grandfather   . Diabetes Maternal Uncle   . Cancer Neg Hx   . Colon cancer Neg Hx   . Gallbladder disease Neg Hx   . Esophageal cancer Neg Hx     BP 138/76 (BP Location: Left Arm, Patient Position: Sitting, Cuff Size: Normal)   Pulse 72   Ht _0  (1.803 m)    Wt 213 lb 9.6 oz (96.9 kg)   SpO2 98%   BMI 29.79 kg/m     Review of Systems Denies LOC.  He has 1 week of rash at the inguinal areas.  No help with "jock itch" cream.     Objective:   Physical Exam VITAL SIGNS:  See vs page GENERAL: no distress Pulses: left dorsalis pedis is intact MSK: no deformity of the left foot CV: trace left leg edema Skin:  no ulcer on the left foot.  normal color and temp on the left foot.  Mild eczematous rash at the inguinal areas.  Neuro: sensation is intact to touch on the left foot, but severely decreased from normal.   EXT: right foot is bandaged.  There is bilateral onychomycosis of the left foot toenails.    A1c=5.5%    Assessment & Plan:  Rash, new, uncertain etiology Insulin-requiring type 2 DM, with PAD: overcontrolled.   Patient Instructions  Please consider these measures for your health:  minimize alcohol.  Do not use tobacco products.  Have a colonoscopy at least every 10 years from age 21.  Keep firearms safely stored.  Always use seat belts.  have working smoke alarms in your home.  See an eye doctor and dentist regularly.  Never drive under the influence of alcohol or drugs (including prescription drugs).   Please reduce the NPH insulin to 40 units each morning, and: Please continue the same regular, with supper.  I have sent a prescription to your pharmacy, for a skin cream Because we don't know the exact cause, you should use the "jock itch" cream along with it.   Please come back for a follow-up appointment in 3 months.

## 2016-08-01 ENCOUNTER — Encounter: Payer: Self-pay | Admitting: Podiatry

## 2016-08-01 NOTE — Progress Notes (Signed)
Right 3rd metatarsal partial excision to remove infected bone

## 2016-08-04 ENCOUNTER — Telehealth: Payer: Self-pay | Admitting: Podiatry

## 2016-08-04 NOTE — Telephone Encounter (Signed)
Pt is scheduled for a MRI, He wanted to know if you just want to see him after his MRI, or should he keep his appt. For 6/11

## 2016-08-06 NOTE — Telephone Encounter (Signed)
Can you see how he is doing? If he is improving will see after the MRI. If unchanged I want to see him on 6/11

## 2016-08-07 ENCOUNTER — Ambulatory Visit (INDEPENDENT_AMBULATORY_CARE_PROVIDER_SITE_OTHER): Payer: Medicare PPO | Admitting: Podiatry

## 2016-08-07 ENCOUNTER — Encounter: Payer: Self-pay | Admitting: Podiatry

## 2016-08-07 VITALS — BP 169/101 | HR 79

## 2016-08-07 DIAGNOSIS — L02611 Cutaneous abscess of right foot: Secondary | ICD-10-CM

## 2016-08-09 NOTE — Progress Notes (Signed)
Subjective: 61 year old male presents the office today for follow-up evaluation of clear drainage to his right foot. He states that he wasn't for the drainage should be doing well. He has not noticed any warmth his foot. He states the drainage has improved states there is a clear drainage. Denies any pus. He states the skin does peelto the top of his foot.He is scheduled for an MRI on Friday. He has continue with Betadine wet-to-dry dressing changes to the top of the foot which is been helping. He stated the area uncovered at night to dry up. He states when he puts no dressing on the foot it seems to do better.  Denies any systemic complaints such as fevers, chills, nausea, vomiting. No acute changes since last appointment, and no other complaints at this time.   Objective: AAO x3, NAD DP/PT pulses palpable bilaterally, CRT less than 3 seconds Incision the surgery appears to be healed. On the very distal portion incision is a very superficial area skin breakdown but there is no probing to the superficial layers the skin. There is no probing, undermining or tunneling otherwise. Continues to be some mild edema to the dorsal aspect of the foot and there is peeling skin present on the dorsal aspect of the foot I am unable to see feel any type of drainage to the foot today. The bottom of the foot the skin is intact and normal in appearance. There is new. Skin present on the top of the foot. No other open lesions or pre-ulcerative lesions identified today. There is no pain with calf compression, swelling, warmth, erythema.  Assessment: 61 year old male with right foot drainage, swelling which continues however improved.  Plan: -Treatment options discussed including all alternatives, risks, and complications -we'll switch to [facility] dressing changes for several days and I dispensed this to him. -Scheduled for MRI on Friday. -We will hold off on further antibiotics as is not appear to be infectious.  The bottom of the foot is normal in appearances ointment to the top of the foot. There is no warmth associated and there is no fluctuance or crepitus there is no ascending cellulitis. We'll continue to monitor closely. We'll reevaluate after the MRI. -Monitor for any clinical signs or symptoms of infection and directed to call the office immediately should any occur or go to the ER. -RTC 1 week or sooner if needed.   Celesta Gentile, DPM

## 2016-08-11 ENCOUNTER — Other Ambulatory Visit: Payer: Self-pay | Admitting: Podiatry

## 2016-08-11 ENCOUNTER — Ambulatory Visit (HOSPITAL_COMMUNITY)
Admission: RE | Admit: 2016-08-11 | Discharge: 2016-08-11 | Disposition: A | Discharge status: Home / Self Care | Payer: Medicare PPO | Source: Ambulatory Visit | Attending: Podiatry | Admitting: Podiatry

## 2016-08-11 DIAGNOSIS — R609 Edema, unspecified: Secondary | ICD-10-CM | POA: Insufficient documentation

## 2016-08-11 DIAGNOSIS — M25871 Other specified joint disorders, right ankle and foot: Secondary | ICD-10-CM | POA: Diagnosis not present

## 2016-08-11 DIAGNOSIS — L02611 Cutaneous abscess of right foot: Secondary | ICD-10-CM | POA: Insufficient documentation

## 2016-08-11 LAB — POCT I-STAT CREATININE: Creatinine, Ser: 1.3 mg/dL — ABNORMAL HIGH (ref 0.61–1.24)

## 2016-08-11 MED ORDER — GADOBENATE DIMEGLUMINE 529 MG/ML IV SOLN
20.0000 mL | Freq: Once | INTRAVENOUS | Status: AC | PRN
  Administered 2016-08-11: 20 mL via INTRAVENOUS

## 2016-08-11 MED ORDER — DOXYCYCLINE HYCLATE 100 MG PO TABS: 100.0000 mg | ORAL_TABLET | Freq: Two times a day (BID) | ORAL | 0 refills | Status: DC

## 2016-08-14 ENCOUNTER — Ambulatory Visit (INDEPENDENT_AMBULATORY_CARE_PROVIDER_SITE_OTHER): Payer: Medicare PPO | Admitting: Podiatry

## 2016-08-14 ENCOUNTER — Encounter: Payer: Self-pay | Admitting: Podiatry

## 2016-08-14 VITALS — BP 166/96 | HR 63

## 2016-08-14 DIAGNOSIS — L02611 Cutaneous abscess of right foot: Secondary | ICD-10-CM | POA: Diagnosis not present

## 2016-08-14 DIAGNOSIS — L02619 Cutaneous abscess of unspecified foot: Secondary | ICD-10-CM

## 2016-08-14 DIAGNOSIS — L03119 Cellulitis of unspecified part of limb: Secondary | ICD-10-CM

## 2016-08-17 NOTE — Progress Notes (Signed)
Subjective: 61 year old male presents the office today for follow-up evaluation of right foot drainage as well as to discuss MRI results. He states the foot is doing about the same. He again states today that the area was not draining he would be doing very well to the right foot. He  He keeps the bandage under the day but he states that night he keeps the bandage off in the morning he states his foot looks almost normal. Denies any systemic complaints such as fevers, chills, nausea, vomiting. No acute changes since last appointment, and no other complaints at this time.   Objective: AAO x3, NAD DP/PT pulses palpable bilaterally, CRT less than 3 seconds Incision the surgery appears to be healed. On the very distal portion incision is a very superficial area skin breakdown but there is no probing to the superficial layers the skin. There is no probing, undermining or tunneling otherwise. Continues to be some mild edema to the dorsal aspect of the foot and there is peeling skin present on the dorsal aspect of the foot I am unable to see feel any type of drainage to the foot today. The bottom of the foot the skin is intact and normal in appearance. There is new skin present on the top of the foot. overall exam appears to be unchanged.  No other open lesions or pre-ulcerative lesions identified today. There is no pain with calf compression, swelling, warmth, erythema.  MRI 08/11/2016 IMPRESSION: 1. Enhancing soft tissue along the amputation bed may have a component of healing granulation tissue, but given the history of drainage to the dorsal foot raises concern for the possibility of underlying inflammatory or infectious phlegmon with drainage. There is no visible abscess. 2. Persistent accentuated enhancement and T2 signal in the shaft of the remaining third metatarsal, suspicious for osteomyelitis. 3. There is also abnormal edema signal proximally in the diaphysis of the fifth metatarsal which  could be from stress reaction or osteomyelitis. 4. Minimal sesamoiditis of the bifid medial sesamoid. 5. Scattered erosions indicating erosive arthropathy, possibly gout. For example along the medial head of the proximal phalanx of the great toe. There is also some erosions of the tufts of the distal phalanges of the first and second toes.  Assessment: 61 year old male with right foot drainagrule out abscess  Plan: -Treatment options discussed including all alternatives, risks, and complications -Discussed the MRI results with the patient. At this point discussed with him and incision and drainage to rule out any abscess or deep infection. Discussed doing this in the office today he wished to go ahead and proceed. Under sterile conditions a mixture of lidocaine and Marcaine plain was infiltrated in a regional block fashion around the procedure site. Once anesthetized the skin was prepped in sterile fashion. A #15 blade scalpel was utilized to make an incision through the epidermis and dermis on the area of the previous scar distally. A hemostat was utilized to bluntly dissect to the interspace. I was able to value with this area and upon expression of is no purulence identified. Upon initial dissection he had some clear serous drainage but this was minimal and the rest was bloody. There is no foreign object identified either. This appeared to be healthy. The incision was copiously irrigated with sterile set and hemostasis was achieved. I had a form is applied followed by dry dressing. Continue with daily dressing changes discussed. -Continue abx -Monitor for any clinical signs or symptoms of infection and directed to call the office immediately should  any occur or go to the ER. -RTC 1 week or sooner if needed.    *x-ray next appointment  Celesta Gentile, DPM

## 2016-08-18 LAB — CULTURE, ROUTINE-ABSCESS
Gram Stain: NONE SEEN
Gram Stain: NONE SEEN

## 2016-08-21 ENCOUNTER — Ambulatory Visit (INDEPENDENT_AMBULATORY_CARE_PROVIDER_SITE_OTHER): Payer: Self-pay | Admitting: Podiatry

## 2016-08-21 ENCOUNTER — Ambulatory Visit (INDEPENDENT_AMBULATORY_CARE_PROVIDER_SITE_OTHER): Payer: Medicare PPO

## 2016-08-21 ENCOUNTER — Encounter: Payer: Self-pay | Admitting: Podiatry

## 2016-08-21 DIAGNOSIS — M869 Osteomyelitis, unspecified: Secondary | ICD-10-CM

## 2016-08-21 DIAGNOSIS — T148XXA Other injury of unspecified body region, initial encounter: Secondary | ICD-10-CM

## 2016-08-21 DIAGNOSIS — L24A9 Irritant contact dermatitis due friction or contact with other specified body fluids: Secondary | ICD-10-CM

## 2016-08-21 DIAGNOSIS — R52 Pain, unspecified: Secondary | ICD-10-CM

## 2016-08-24 NOTE — Progress Notes (Signed)
Subjective: 61 year old male presents the office today for follow-up evaluation of right foot drainage and status post incision and drainage in the office. He states that he was packing the wound daily but he states the wound is closing he cannot put any more packing to the area. He states the procedure site is doing well but the foot does continue to drain although this has improved. He states it does fluctuate. Denies any pus is likely clear drainage. Denies any systemic complaints such as fevers, chills, nausea, vomiting. No acute changes since last appointment, and no other complaints at this time.   Objective: AAO x3, NAD DP/PT pulses palpable bilaterally, CRT less than 3 seconds The incision from the recent incision and drainages and was completely healed is almost a superficial granular wound present along this area but there is no probing, undermining or tunneling is no area pack the wound. There is no drainage or pus expressed from this area is no malodor. There is dry, peeling skin to the dorsal aspect of the foot with some mild serous drainage but there is no pus. There is faint edema to the foot there is no erythema or increase in warmth. This is on the dorsal aspect of the foot and there is no skin change the plantar aspect of the foot. There is no warmth or ascending cellulitis.   No other open lesions or pre-ulcerative lesions identified today. There is no pain with calf compression, swelling, warmth, erythema.  MRI 08/11/2016 IMPRESSION: 1. Enhancing soft tissue along the amputation bed may have a component of healing granulation tissue, but given the history of drainage to the dorsal foot raises concern for the possibility of underlying inflammatory or infectious phlegmon with drainage. There is no visible abscess. 2. Persistent accentuated enhancement and T2 signal in the shaft of the remaining third metatarsal, suspicious for osteomyelitis. 3. There is also abnormal edema signal  proximally in the diaphysis of the fifth metatarsal which could be from stress reaction or osteomyelitis. 4. Minimal sesamoiditis of the bifid medial sesamoid. 5. Scattered erosions indicating erosive arthropathy, possibly gout. For example along the medial head of the proximal phalanx of the great toe. There is also some erosions of the tufts of the distal phalanges of the first and second toes.  Assessment: 61 year old male with right foot drainagrule out abscess  Plan: -Treatment options discussed including all alternatives, risks, and complications -X-rays were obtained and reviewed. Is no evidence of he also has identified at this time. -Recommend continue with small amount of Betadine along the incision and the recent incision and drainage. Regards the other areas of foot we will hold off any further advance wound care and was instructed acute the area dry and clean. His been very tricky with him. He was appears to second allergic type reaction/venous type wound to the top of the foot. I recommended follow-up with him on Friday however she states that he thinks she'll do well for 2 weeks next connected 2 weeks. Discussed and monitor closely there is any problems to call the office medially. When he comes back in if is not improved and we'll have him get a second opinion. -Monitor for any clinical signs or symptoms of infection and directed to call the office immediately should any occur or go to the ER.  *x-ray next appointment   Celesta Gentile, DPM

## 2016-08-27 ENCOUNTER — Other Ambulatory Visit: Payer: Self-pay | Admitting: Podiatry

## 2016-09-11 ENCOUNTER — Ambulatory Visit (INDEPENDENT_AMBULATORY_CARE_PROVIDER_SITE_OTHER): Payer: Medicare PPO | Admitting: Podiatry

## 2016-09-11 ENCOUNTER — Ambulatory Visit (INDEPENDENT_AMBULATORY_CARE_PROVIDER_SITE_OTHER): Payer: Medicare PPO

## 2016-09-11 ENCOUNTER — Telehealth: Payer: Self-pay | Admitting: Podiatry

## 2016-09-11 DIAGNOSIS — M869 Osteomyelitis, unspecified: Secondary | ICD-10-CM

## 2016-09-11 DIAGNOSIS — L249 Irritant contact dermatitis, unspecified cause: Secondary | ICD-10-CM | POA: Diagnosis not present

## 2016-09-11 MED ORDER — TRIAMCINOLONE ACETONIDE 0.1 % EX CREA: 1.0000 "application " | TOPICAL_CREAM | Freq: Two times a day (BID) | CUTANEOUS | 0 refills | Status: DC

## 2016-09-11 MED ORDER — ERYTHROMYCIN BASE 500 MG PO TABS: 500.0000 mg | ORAL_TABLET | Freq: Four times a day (QID) | ORAL | 0 refills | Status: DC

## 2016-09-11 NOTE — Telephone Encounter (Signed)
I'm a patient of Dr. Leigh Aurora. If someone could please call me back at [phone]. Thank you.

## 2016-09-11 NOTE — Telephone Encounter (Addendum)
Pt states the medication Dr. Jacqualyn Posey ordered was $400.00, would like an alternate.09/12/2016-Pt called for new antibiotic. I informed pt Dr. Jacqualyn Posey ordered Clindamycin and he could pick that up at the [facility] on [address].

## 2016-09-11 NOTE — Telephone Encounter (Signed)
Which one the steroid or antibiotic?

## 2016-09-12 MED ORDER — CLINDAMYCIN HCL 300 MG PO CAPS: ORAL_CAPSULE | ORAL | 0 refills | Status: DC

## 2016-09-12 NOTE — Telephone Encounter (Signed)
Please do clindamycin 300mg  4 times a day for 10 days. Thanks.

## 2016-09-14 NOTE — Progress Notes (Signed)
Subjective: Mr. Allsbrook presents the office today for follow-up evaluation of drainage the top of his right foot as well as pealing skin. He states that he leaves the area uncovered at night in the mornings when he gets appears nice skin on top of his foot is not draining. However when he keeps the foot bandaged in the day when he is working the foot continues to drain. Denies any pus  Deni states it is clear drainage. Denies any redness or red streaks. There is no pain. He states the surgical sites are healed and if it wasn't for the draining he be doing well. Denies any systemic complaints such as fevers, chills, nausea, vomiting. No acute changes since last appointment, and no other complaints at this time.   Objective: AAO x3, NAD DP/PT pulses palpable bilaterally, CRT less than 3 seconds Dry, peeling skin present in the dorsal aspect of the foot with clear drainage present. There is erythema likely multiple inflammation as opposed infection. There is no increase in warmth to the foot. There is mild edema. There is no pus. There is no areas of fluctuance or crepitus. Incision from the prior surgeries are well-healed.  No open lesions or pre-ulcerative lesions.  No pain with calf compression, swelling, warmth, erythema  Assessment: Likely contact dermatitis right foot   Plan: -All treatment options discussed with the patient including all alternatives, risks, complications.  -X-rays were obtained and reviewed with the patient. No evidence of acute osteomyelitis present. There is no soft tissue edema present. -I discussed this case with a colleague in [place] with the patients permission. Also I had Dr. Amalia Hailey from our office see the patient today for another opinion. At this time believe it is a contact dermatitis. Prescribed triamcinolone cream. I also prescribed erythromycin.  -Continue surgical shoe. He can keep the foot wrapped while working for the drainage however otherwise keep the  foot unwrapped among the area dry seems to do very well for him.  -RTC 1 week -Patient encouraged to call the office with any questions, concerns, change in symptoms.   *possible skin biopsy next appt, lab work  Celesta Gentile, DPM

## 2016-09-21 ENCOUNTER — Encounter: Payer: Self-pay | Admitting: Podiatry

## 2016-09-21 ENCOUNTER — Ambulatory Visit (INDEPENDENT_AMBULATORY_CARE_PROVIDER_SITE_OTHER): Payer: Medicare PPO | Admitting: Podiatry

## 2016-09-21 DIAGNOSIS — L249 Irritant contact dermatitis, unspecified cause: Secondary | ICD-10-CM | POA: Insufficient documentation

## 2016-09-21 NOTE — Progress Notes (Signed)
Subjective: Mr. Nygard presents the office today for follow-up evaluation of drainage the top of his right foot as well as pealing skin. He states that since last appointment the foot is excellent doing better. This is the first appointment he states he has noted some improvement the top of his foot. He has continue the antibiotics he states he has one more day of this. Unfortunately steroid cream prescribed was not covered by insurance and he states that he had fluocinonide cream at home and he has been using this. Since using the cream of antibiotics he is most improvement. He states the drainage is also improved. Denies any pus. Denies any pain. Denies any decrease in swelling or redness. He is rating the surgical shoe. He does try keep the area uncovered as much as he can. Denies any systemic complaints such as fevers, chills, nausea, vomiting. No acute changes since last appointment, and no other complaints at this time.   Objective: AAO x3, NAD DP/PT pulses palpable bilaterally, CRT less than 3 seconds Dry, peeling skin present in the dorsal aspect of the foot with minimal clear drainage present. Drainage appears to be much improved. Overall the appearance the foot is much improved. See picture below. There is only the skin irritation the dorsal aspect of the foot and the plantar aspect is completely normal. No areas of fluctuance or crepitus there is no malodor. There is no ascending cellulitis. Incision from the prior surgeries are well-healed.  No open lesions or pre-ulcerative lesions.  No pain with calf compression, swelling, warmth, erythema      Assessment: Likely contact dermatitis right foot   Plan: -All treatment options discussed with the patient including all alternatives, risks, complications.  -Finish course of antibiotics. Once he finishes if he notices any worsening symptoms call the office when restart antibiotics. -Continue topical steroid cream, fluocinonide  cream -Keep a bandage intact in the day but try to leave uncovered as much as he can at nighttime or around the house. Continue surgical shoe. -Elevation. -Can hold off on biopsy at this time as the symptoms are improved. -RTC 2 weeks or sooner if needed. Call any questions or concerns. Monitor for any clinical signs or symptoms of infection and directed to call the office immediately should any occur or go to the ER.   Celesta Gentile, DPM

## 2016-10-09 ENCOUNTER — Ambulatory Visit (INDEPENDENT_AMBULATORY_CARE_PROVIDER_SITE_OTHER): Payer: Medicare PPO

## 2016-10-09 ENCOUNTER — Ambulatory Visit (INDEPENDENT_AMBULATORY_CARE_PROVIDER_SITE_OTHER): Payer: Medicare PPO | Admitting: Podiatry

## 2016-10-09 ENCOUNTER — Encounter: Payer: Self-pay | Admitting: Podiatry

## 2016-10-09 DIAGNOSIS — Z9889 Other specified postprocedural states: Secondary | ICD-10-CM | POA: Diagnosis not present

## 2016-10-09 DIAGNOSIS — L249 Irritant contact dermatitis, unspecified cause: Secondary | ICD-10-CM | POA: Diagnosis not present

## 2016-10-09 NOTE — Progress Notes (Signed)
Subjective: Aaron Fox presents the office today for follow-up evaluation of drainage the top of his right foot as well as pealing skin. He states he is doing much better. He states over the last couple of days he said no drainage to his feet and his area starting to dry up quite a bit. He states that the swelling has resolved as well. Denies any increase in redness or red streaks. He states he has no pain to the area and there is no itching. He is continuing the topical steroid cream. He has returned to regular shoe without any issues. Denies any systemic complaints such as fevers, chills, nausea, vomiting. No acute changes since last appointment, and no other complaints at this time.   Objective: AAO x3, NAD DP/PT pulses palpable bilaterally, CRT less than 3 seconds Dry, peeling skin present in the dorsal aspect of the foot which has continue however does appear to be improved. There is no drainage expressed today there is no Tamala Julian can swelling. There is no areas of fluctuance or crepitus. There is no malodor. The incision from the prior surgeries appear to be well-healed. No open lesions or pre-ulcerative lesions.  No pain with calf compression, swelling, warmth, erythema   Assessment: Likely contact dermatitis right foot   Plan: -All treatment options discussed with the patient including all alternatives, risks, complications.  -At this point we'll hold off on any further oral antibiotics. Recommend continue with topical steroid cream daily. Charnley the area uncovers much is a can to avoid any further irritants -Monitor for any clinical signs or symptoms of infection and directed to call the office immediately should any occur or go to the ER. -All follow-up next 3 weeks or sooner if needed.  Celesta Gentile, DPM

## 2016-11-02 ENCOUNTER — Encounter: Payer: Self-pay | Admitting: Endocrinology

## 2016-11-02 ENCOUNTER — Ambulatory Visit (INDEPENDENT_AMBULATORY_CARE_PROVIDER_SITE_OTHER): Payer: Medicare PPO | Admitting: Endocrinology

## 2016-11-02 VITALS — BP 112/76 | HR 72 | Wt 217.8 lb

## 2016-11-02 DIAGNOSIS — E1042 Type 1 diabetes mellitus with diabetic polyneuropathy: Secondary | ICD-10-CM | POA: Diagnosis not present

## 2016-11-02 DIAGNOSIS — E1065 Type 1 diabetes mellitus with hyperglycemia: Secondary | ICD-10-CM | POA: Diagnosis not present

## 2016-11-02 DIAGNOSIS — IMO0002 Reserved for concepts with insufficient information to code with codable children: Secondary | ICD-10-CM

## 2016-11-02 LAB — POCT GLYCOSYLATED HEMOGLOBIN (HGB A1C): Hemoglobin A1C: 5.7

## 2016-11-02 MED ORDER — INSULIN NPH (HUMAN) (ISOPHANE) 100 UNIT/ML ~~LOC~~ SUSP: 30.0000 [IU] | SUBCUTANEOUS | 2 refills | Status: DC

## 2016-11-02 NOTE — Progress Notes (Signed)
Subjective:    Patient ID: Aaron Fox, male    DOB: 10/31/1955, 60 y.o.   MRN: [id number]  HPI Pt returns for f/u of diabetes mellitus: DM type: 1 Dx'ed: 0321 Complications: polyneuropathy, retinopathy, PAD, and renal insufficiency.  Therapy: insulin since 2003 DKA: never.  Severe hypoglycemia: never Pancreatitis: never.  Other: he is on a regimen of 2 QD insulins, after poor results with multiple daily injections; he takes human insulin, due to cost.   Interval history: no cbg record, but he has frequent mild hypoglycemia at lunch.  pt states he feels well in general.   Past Medical History:  Diagnosis Date  . ALLERGIC RHINITIS 07/02/2008  . Atrial fibrillation ([place])   . CHF 06/19/2008  . COUGH DUE TO ACE INHIBITORS 10/05/2008  . DIABETES MELLITUS, TYPE II 09/22/2006  . ED (erectile dysfunction)   . Glaucoma   . GOUT 09/22/2006  . Hx of adenomatous colonic polyps 06/09/2015  . HYPERCHOLESTEROLEMIA 07/02/2008  . HYPERTENSION 05/31/2009  . HYPOGONADISM, MALE 01/15/2007  . Hypokalemia   . 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 Lymphadenopathy   . Peripheral neuropathy   . PUD (peptic ulcer disease)   . Rheumatoid arthritis(714.0) 09/22/2006    Past Surgical History:  Procedure Laterality Date  . CATARACT EXTRACTION, BILATERAL    . COLONOSCOPY  2004  . LYMPH NODE BIOPSY    . LYMPH NODE BIOPSY Right 10/16/2014   Procedure: RIGHT INGUINAL LYMPH NODE BIOPSY;  Surgeon: Aviva Signs, MD;  Location: [facility];  Service: General;  Laterality: Right;    Social History   Social History  . Marital status: Married    Spouse name: N/A  . Number of children: 3  . Years of education: N/A   Occupational History  . Diability [employer]   Social History Main Topics  . Smoking status: Former Smoker    Quit date: 11/02/1999  . Smokeless tobacco: Never Used  . Alcohol use No  . Drug use: No  . Sexual activity: Not on file   Other Topics Concern  . Not on file   Social  History Narrative   Regular exercise-yes    Current Outpatient Prescriptions on File Prior to Visit  Medication Sig Dispense Refill  . ACCU-CHEK FASTCLIX LANCETS MISC Use to check blood sugar 2 times per day. 204 each 2  . Alcohol Swabs (B-D SINGLE USE SWABS REGULAR) PADS Use to check blood sugar two times per day. 200 each 2  . aspirin 81 MG tablet Take 81 mg by mouth daily.      Marland Kitchen atorvastatin (LIPITOR) 10 MG tablet Take 1 tablet (10 mg total) by mouth daily. 90 tablet 3  . BD INSULIN SYRINGE ULTRAFINE 31G X 5/16" 0.5 ML MISC Use with insulin. 100 each 5  . Blood Glucose Monitoring Suppl (ACCU-CHEK NANO SMARTVIEW) w/Device KIT Use to test blood sugar daily 1 kit 1  . clindamycin (CLEOCIN) 300 MG capsule Take one tablet 4 times daily. 40 capsule 0  . erythromycin base (E-MYCIN) 500 MG tablet Take 1 tablet (500 mg total) by mouth 4 (four) times daily. 28 tablet 0  . gabapentin (NEURONTIN) 300 MG capsule Take 1 capsule (300 mg total) by mouth 2 (two) times daily. 60 capsule 2  . glucose blood (ACCU-CHEK SMARTVIEW) test strip 1 each by Other route 2 (two) times daily. And lancets 2/day 200 each 2  . HYDROcodone-acetaminophen (NORCO/VICODIN) 5-325 MG tablet Take 1 tablet by mouth every 4 (four) hours  as needed for moderate pain.    Marland Kitchen insulin regular (NOVOLIN R RELION) 250 units/2.43m (100 units/mL) injection Inject 0.07 mLs (7 Units total) into the skin daily with supper. And syringes 2/day 10 mL 11  . losartan (COZAAR) 50 MG tablet Take 1 tablet (50 mg total) by mouth daily. 90 tablet 3  . MONOJECT ULTRA COMFORT SYRINGE 29G X 1/2" 1 ML MISC USE TO INJECT INSULIN 4 TIMES PER DAY 400 each 3  . triamcinolone cream (KENALOG) 0.1 % Apply 1 application topically 3 (three) times daily. As needed for rash. 45 g 2  . triamcinolone cream (KENALOG) 0.1 % Apply 1 application topically 2 (two) times daily. 30 g 0   No current facility-administered medications on file prior to visit.     Allergies    Allergen Reactions  . Penicillins Swelling    Swelling of the hands   . Terbinafine Hcl Hives  . Zestril [Lisinopril] Cough  . Ciprofloxacin Rash    Family History  Problem Relation Age of Onset  . Heart disease Other        FH of CAD  . Colon polyps Paternal Uncle   . Kidney disease Paternal Uncle   . Diabetes Paternal Grandfather   . Diabetes Maternal Uncle   . Cancer Neg Hx   . Colon cancer Neg Hx   . Gallbladder disease Neg Hx   . Esophageal cancer Neg Hx     BP 112/76   Pulse 72   Wt 217 lb 12.8 oz (98.8 kg)   SpO2 98%   BMI 30.38 kg/m    Review of Systems Denies LOC.     Objective:   Physical Exam VITAL SIGNS:  See vs page GENERAL: no distress Pulses: foot pulses are intact bilaterally.   MSK: no deformity of the feet or ankles, except the right 3rd toe is absent.  CV: trace bilat edema of the legs.  Skin:  no ulcer on the feet or ankles.  normal color and temp on the feet and ankles.  The skin of the right foot is vary scaly.  healing surgical scar at the right foot. Neuro: sensation is intact to touch on the feet and ankles, but decreased from normal.  Ext: There is bilateral onychomycosis of the toenails.   Lab Results  Component Value Date   HGBA1C 5.7 11/02/2016   Lab Results  Component Value Date   CREATININE 1.30 (H) 08/11/2016   BUN 20 04/28/2016   NA 138 04/28/2016   K 4.5 04/28/2016   CL 106 04/28/2016   CO2 23 04/28/2016       Assessment & Plan:  Type 1 DM: overcontrolled  Patient Instructions  Please reduce the NPH insulin to 30 units each morning, and: Please continue the same regular, with supper.  Please come back for a follow-up appointment in 2 months.   check your blood sugar twice a day.  vary the time of day when you check, between before the 3 meals, and at bedtime.  also check if you have symptoms of your blood sugar being too high or too low.  please keep a record of the readings and bring it to your next appointment  here (or you can bring the meter itself).  You can write it on any piece of paper.  please call [facility] if your blood sugar goes below 70, or if you have a lot of readings over 200.

## 2016-11-02 NOTE — Patient Instructions (Addendum)
Please reduce the NPH insulin to 30 units each morning, and: Please continue the same regular, with supper.  Please come back for a follow-up appointment in 2 months.   check your blood sugar twice a day.  vary the time of day when you check, between before the 3 meals, and at bedtime.  also check if you have symptoms of your blood sugar being too high or too low.  please keep a record of the readings and bring it to your next appointment here (or you can bring the meter itself).  You can write it on any piece of paper.  please call us sooner if your blood sugar goes below 70, or if you have a lot of readings over 200.

## 2016-11-06 ENCOUNTER — Ambulatory Visit: Payer: Medicare PPO | Admitting: Podiatry

## 2017-01-08 ENCOUNTER — Telehealth: Payer: Self-pay | Admitting: Endocrinology

## 2017-01-08 ENCOUNTER — Encounter: Payer: Self-pay | Admitting: Endocrinology

## 2017-01-08 ENCOUNTER — Ambulatory Visit (INDEPENDENT_AMBULATORY_CARE_PROVIDER_SITE_OTHER): Payer: Medicare PPO | Admitting: Endocrinology

## 2017-01-08 VITALS — BP 128/80 | HR 67 | Wt 225.6 lb

## 2017-01-08 DIAGNOSIS — E11319 Type 2 diabetes mellitus with unspecified diabetic retinopathy without macular edema: Secondary | ICD-10-CM

## 2017-01-08 DIAGNOSIS — M25569 Pain in unspecified knee: Secondary | ICD-10-CM | POA: Diagnosis not present

## 2017-01-08 DIAGNOSIS — Z794 Long term (current) use of insulin: Secondary | ICD-10-CM | POA: Diagnosis not present

## 2017-01-08 DIAGNOSIS — Z23 Encounter for immunization: Secondary | ICD-10-CM | POA: Diagnosis not present

## 2017-01-08 LAB — POCT GLYCOSYLATED HEMOGLOBIN (HGB A1C): Hemoglobin A1C: 5.8

## 2017-01-08 MED ORDER — INSULIN NPH (HUMAN) (ISOPHANE) 100 UNIT/ML ~~LOC~~ SUSP: 28.0000 [IU] | SUBCUTANEOUS | 2 refills | Status: DC

## 2017-01-08 MED ORDER — INSULIN REGULAR HUMAN 100 UNIT/ML IJ SOLN: 5.0000 [IU] | Freq: Every day | INTRAMUSCULAR | 11 refills | Status: DC

## 2017-01-08 NOTE — Patient Instructions (Addendum)
Please reduce the insulins to the numbers listed below.  Please come back for a follow-up appointment in 3 months.   check your blood sugar twice a day.  vary the time of day when you check, between before the 3 meals, and at bedtime.  also check if you have symptoms of your blood sugar being too high or too low.  please keep a record of the readings and bring it to your next appointment here (or you can bring the meter itself).  You can write it on any piece of paper.  please call us sooner if your blood sugar goes below 70, or if you have a lot of readings over 200.

## 2017-01-08 NOTE — Telephone Encounter (Signed)
Called patient & left VM that referral was done.

## 2017-01-08 NOTE — Telephone Encounter (Signed)
Ok, done 

## 2017-01-08 NOTE — Progress Notes (Signed)
Subjective:    Patient ID: Aaron Fox, male    DOB: 09/29/1955, 61 y.o.   MRN: [id number]  HPI Pt returns for f/u of diabetes mellitus: DM type: 1 Dx'ed: 8916 Complications: polyneuropathy, retinopathy, PAD, and renal insufficiency.  Therapy: insulin since 2003 DKA: never.  Severe hypoglycemia: never Pancreatitis: never.  Other: he is on a regimen of 2 QD insulins, after poor results with multiple daily injections; he takes human insulin, due to cost.   Interval history: no cbg record, but he still has mild hypoglycemia at lunch, approx once per month.  This happened at [facility], or before lunch.  It is highest in the afternoon.  pt states he feels well in general.   Past Medical History:  Diagnosis Date  . ALLERGIC RHINITIS 07/02/2008  . Atrial fibrillation ([place])   . CHF 06/19/2008  . COUGH DUE TO ACE INHIBITORS 10/05/2008  . DIABETES MELLITUS, TYPE II 09/22/2006  . ED (erectile dysfunction)   . Glaucoma   . GOUT 09/22/2006  . Hx of adenomatous colonic polyps 06/09/2015  . HYPERCHOLESTEROLEMIA 07/02/2008  . HYPERTENSION 05/31/2009  . HYPOGONADISM, MALE 01/15/2007  . Hypokalemia   . 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 Lymphadenopathy   . Peripheral neuropathy   . PUD (peptic ulcer disease)   . Rheumatoid arthritis(714.0) 09/22/2006    Past Surgical History:  Procedure Laterality Date  . CATARACT EXTRACTION, BILATERAL    . COLONOSCOPY  2004  . LYMPH NODE BIOPSY      Social History   Socioeconomic History  . Marital status: Married    Spouse name: Not on file  . Number of children: 3  . Years of education: Not on file  . Highest education level: Not on file  Social Needs  . Financial resource strain: Not on file  . Food insecurity - worry: Not on file  . Food insecurity - inability: Not on file  . Transportation needs - medical: Not on file  . Transportation needs - non-medical: Not on file  Occupational History  . Occupation: TEFL teacher: [employer]  Tobacco Use  . Smoking status: Former Smoker    Last attempt to quit: 11/02/1999    Years since quitting: 17.1  . Smokeless tobacco: Never Used  Substance and Sexual Activity  . Alcohol use: No    Alcohol/week: 0.0 oz  . Drug use: No  . Sexual activity: Not on file  Other Topics Concern  . Not on file  Social History Narrative   Regular exercise-yes    Current Outpatient Medications on File Prior to Visit  Medication Sig Dispense Refill  . ACCU-CHEK FASTCLIX LANCETS MISC Use to check blood sugar 2 times per day. 204 each 2  . Alcohol Swabs (B-D SINGLE USE SWABS REGULAR) PADS Use to check blood sugar two times per day. 200 each 2  . aspirin 81 MG tablet Take 81 mg by mouth daily.      Marland Kitchen atorvastatin (LIPITOR) 10 MG tablet Take 1 tablet (10 mg total) by mouth daily. 90 tablet 3  . BD INSULIN SYRINGE ULTRAFINE 31G X 5/16" 0.5 ML MISC Use with insulin. 100 each 5  . Blood Glucose Monitoring Suppl (ACCU-CHEK NANO SMARTVIEW) w/Device KIT Use to test blood sugar daily 1 kit 1  . gabapentin (NEURONTIN) 300 MG capsule Take 1 capsule (300 mg total) by mouth 2 (two) times daily. 60 capsule 2  . glucose blood (ACCU-CHEK SMARTVIEW) test strip 1 each by  Other route 2 (two) times daily. And lancets 2/day 200 each 2  . HYDROcodone-acetaminophen (NORCO/VICODIN) 5-325 MG tablet Take 1 tablet by mouth every 4 (four) hours as needed for moderate pain.    Marland Kitchen losartan (COZAAR) 50 MG tablet Take 1 tablet (50 mg total) by mouth daily. 90 tablet 3  . MONOJECT ULTRA COMFORT SYRINGE 29G X 1/2" 1 ML MISC USE TO INJECT INSULIN 4 TIMES PER DAY 400 each 3  . triamcinolone cream (KENALOG) 0.1 % Apply 1 application topically 3 (three) times daily. As needed for rash. 45 g 2  . triamcinolone cream (KENALOG) 0.1 % Apply 1 application topically 2 (two) times daily. 30 g 0  . clindamycin (CLEOCIN) 300 MG capsule Take one tablet 4 times daily. (Patient not taking: Reported on 01/08/2017) 40 capsule 0  . erythromycin  base (E-MYCIN) 500 MG tablet Take 1 tablet (500 mg total) by mouth 4 (four) times daily. (Patient not taking: Reported on 01/08/2017) 28 tablet 0   No current facility-administered medications on file prior to visit.     Allergies  Allergen Reactions  . Penicillins Swelling    Swelling of the hands   . Terbinafine Hcl Hives  . Zestril [Lisinopril] Cough  . Ciprofloxacin Rash    Family History  Problem Relation Age of Onset  . Heart disease Other        FH of CAD  . Colon polyps Paternal Uncle   . Kidney disease Paternal Uncle   . Diabetes Paternal Grandfather   . Diabetes Maternal Uncle   . Cancer Neg Hx   . Colon cancer Neg Hx   . Gallbladder disease Neg Hx   . Esophageal cancer Neg Hx     BP 128/80 (BP Location: Left Arm, Patient Position: Sitting, Cuff Size: Normal)   Pulse 67   Wt 225 lb 9.6 oz (102.3 kg)   SpO2 97%   BMI 31.46 kg/m    Review of Systems Denies LOC.  knee pain has recurred.     Objective:   Physical Exam VITAL SIGNS:  See vs page GENERAL: no distress Pulses: foot pulses are intact bilaterally.   MSK: no deformity of the feet or ankles, except the right 3rd toe is absent.  CV: trace bilat edema of the legs.  Skin:  no ulcer on the feet or ankles.  normal color and temp on the feet and ankles.  The skin of the feet is vary scaly.  healed surgical scar at the right foot. Neuro: sensation is intact to touch on the feet and ankles, but decreased from normal.  Ext: There is bilateral onychomycosis of the toenails.    A1c=5.8%    Assessment & Plan:  Insulin-requiring type 2 DM: overcontrolled.  Knee pain: Ref sports medicine.    Patient Instructions  Please reduce the insulins to the numbers listed below.  Please come back for a follow-up appointment in 3 months.   check your blood sugar twice a day.  vary the time of day when you check, between before the 3 meals, and at bedtime.  also check if you have symptoms of your blood sugar being too  high or too low.  please keep a record of the readings and bring it to your next appointment here (or you can bring the meter itself).  You can write it on any piece of paper.  please call us sooner if your blood sugar goes below 70, or if you have a lot of readings over 200.

## 2017-01-08 NOTE — Telephone Encounter (Signed)
Patient would like to have a referral to see someone for his knee. Please advise

## 2017-01-10 ENCOUNTER — Ambulatory Visit
Admission: RE | Admit: 2017-01-10 | Discharge: 2017-01-10 | Disposition: A | Discharge status: Home / Self Care | Payer: Medicare PPO | Source: Ambulatory Visit | Attending: Sports Medicine | Admitting: Sports Medicine

## 2017-01-10 ENCOUNTER — Ambulatory Visit: Payer: Medicare PPO | Admitting: Family Medicine

## 2017-01-10 ENCOUNTER — Encounter: Payer: Self-pay | Admitting: Family Medicine

## 2017-01-10 VITALS — BP 140/80 | Ht 71.5 in | Wt 220.0 lb

## 2017-01-10 DIAGNOSIS — G8929 Other chronic pain: Secondary | ICD-10-CM | POA: Diagnosis not present

## 2017-01-10 DIAGNOSIS — M25562 Pain in left knee: Principal | ICD-10-CM

## 2017-01-10 MED ORDER — METHYLPREDNISOLONE ACETATE 40 MG/ML IJ SUSP
40.0000 mg | Freq: Once | INTRAMUSCULAR | Status: AC
  Administered 2017-01-10: 40 mg via INTRA_ARTICULAR

## 2017-01-10 NOTE — Progress Notes (Signed)
Left message on his home phone to call office and let me know best number and time to go over his XR results.

## 2017-01-10 NOTE — Progress Notes (Signed)
Chief complaint: Acute on chronic left knee pain x 4-6 weeks  History of present illness: Aaron Fox is a 61 year old male who presents to the sports medicine office today with chief complaint of left knee pain. He reports that symptoms have been present for approximately 10 years, but has been acutely worsening over the last 4-6 weeks. He does not report of any inciting incident, trauma, or injury to explain the pain. He describes the pain as a throbbing and occasionally sharp pain. He reports pain being on the medial aspect of his left knee, but also reports pain medication being on lateral aspect of his left knee. He does report of swelling, but no warmth, erythema, ecchymosis. He does not report of any previous injury to his left knee. He reports any type of prolonged standing or bending are aggravating factors. He does report of mechanical symptoms included popping, locking, catching, symptoms or giving way. He has used occasional ibuprofen, with no improvement in symptoms. He has also used an over-the-counter copper knee sleeve, feels that it is mostly loose. Today, he describes the pain as a 9/10. He reports in the past he went to a [facility] clinic, reports that he did not get any interval improvement in his symptoms with that.  Review of systems:  As stated above  His past medical history, surgical history, family history, and social history obtained and reviewed. He does have history of gout, hyperlipidemia, and diabetes. He does report of former tobacco use, quit back in 2001. Does not report of any surgery involving his left knee. Family history remarkable for diabetes and heart disease.  Physical exam: Vital signs are reviewed and are documented in the chart Gen.: Alert, oriented, appears stated age, in no apparent distress HEENT: Moist oral mucosa Respiratory: Normal respirations, able to speak in full sentences Cardiac: Regular rate, distal pulses 2+ Integumentary: No rashes on visible  skin:  Neurologic: Strength 5/5, sensation 2+ in bilateral lower extremities Psych: Normal affect, mood is described as good Musculoskeletal: Inspection his left knee reveals no obvious deformity or muscle atrophy, he does have a slight amount of ballotable effusion in the suprapatellar pouch, no warmth, erythema, ecchymosis, he is tender to palpation today over the medial joint line, no tenderness along quadriceps tendon, patellar tendon, or lateral joint line, no fullness in the popliteal fossa, no signs of ligamentous instability, Lachman, anterior drawer, valgus, varus stress testing negative, McMurray positive for both pain and crepitus, does have full range of motion in his knee today, is able to go from 0 to 130, do feel crepitus along arc of range of motion  Procedure Note: After obtaining informed written consent, outlining risk of bleeding, infection, and steroid flare, with benefit of being pain relief, patient did agree to cortisone injection to the left knee. Betadine and alcohol swabs were used to disinfect the skin. Ethyl chloride was used as topical anesthetic spray. 4 cc of 1% lidocaine without epinephrine and 2 cc of Depo-medrol were used, using a 22 g 1.5 in needle was injected into the left knee from an inferolateral approach. Afterwards, band-aid was applied. No complications noted  Assessment and plan: 1. 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 Also discussed with him that I would not be surprised if he at least has a partial degenerative meniscal tear involving the medial meniscus. Given the pain that he  is having today, discussed next best step would be cortisone injection into his left knee. He is agreeable to this. This was done without any complications. I discussed that there is not enough fluid to  be feasible to do an aspiration prior to injection of the cortisone. Discussed home exercise program for him to do at home. Can use Aleve as needed for pain. Also discussed compressive knee sleeve. Did offer him Body Helix sleeve here in the office, he declined on this due to cost prohibitive reasons. He will get another similar sleeve over-the-counter. I discussed if he does not have any interval improvement in symptoms in next 3-4 weeks to to call office and notify me. If he does call and say no improvement, next step would be to obtain a MRI of his left knee to ensure that he does not have a large meniscal tear that would require any type of surgical intervention. Otherwise, discussed will have patient follow-up on as-needed basis.   Mort Sawyers, M.D. [place]

## 2017-01-11 ENCOUNTER — Other Ambulatory Visit: Payer: Self-pay

## 2017-01-11 ENCOUNTER — Telehealth: Payer: Self-pay | Admitting: Endocrinology

## 2017-01-11 ENCOUNTER — Other Ambulatory Visit: Payer: Self-pay | Admitting: Endocrinology

## 2017-01-11 NOTE — Telephone Encounter (Signed)
Done

## 2017-01-11 NOTE — Telephone Encounter (Signed)
New Message   *STAT* If patient is at the pharmacy, call can be transferred to refill team.   1. Which medications need to be refilled? (please list name of each medication and dose if known)  losartan 50 mg tablet once daily  2. Which pharmacy/location (including street and city if local pharmacy) is medication to be sent to? [place]  3. Do they need a 30 day or 90 day supply?  90 days

## 2017-01-12 ENCOUNTER — Ambulatory Visit: Payer: Medicare PPO | Admitting: Sports Medicine

## 2017-02-05 ENCOUNTER — Other Ambulatory Visit: Payer: Self-pay | Admitting: Endocrinology

## 2017-04-10 ENCOUNTER — Encounter: Payer: Self-pay | Admitting: Endocrinology

## 2017-04-10 ENCOUNTER — Ambulatory Visit (INDEPENDENT_AMBULATORY_CARE_PROVIDER_SITE_OTHER): Payer: Medicare HMO | Admitting: Endocrinology

## 2017-04-10 VITALS — BP 132/80 | HR 81 | Wt 228.8 lb

## 2017-04-10 DIAGNOSIS — Z794 Long term (current) use of insulin: Secondary | ICD-10-CM | POA: Diagnosis not present

## 2017-04-10 DIAGNOSIS — E11319 Type 2 diabetes mellitus with unspecified diabetic retinopathy without macular edema: Secondary | ICD-10-CM | POA: Diagnosis not present

## 2017-04-10 LAB — POCT GLYCOSYLATED HEMOGLOBIN (HGB A1C): HEMOGLOBIN A1C: 7.2

## 2017-04-10 NOTE — Progress Notes (Signed)
Subjective:    Patient ID: Aaron Fox, male    DOB: 11-28-1955, 62 y.o.   MRN: [id number]  HPI Pt returns for f/u of diabetes mellitus: DM type: 1 Dx'ed: 0045 Complications: polyneuropathy, retinopathy, PAD, and renal insufficiency.  Therapy: insulin since 2003 DKA: never.  Severe hypoglycemia: never Pancreatitis: never.  Other: he is on a regimen of 2 QD insulins, after poor results with multiple daily injections; he takes human insulin, due to cost.   Interval history: no cbg record, but he still has mild hypoglycemia approx once per month.  This happens in the middle of the night.  It is highest in the afternoon.  pt states he feels well in general.   Past Medical History:  Diagnosis Date  . ALLERGIC RHINITIS 07/02/2008  . Atrial fibrillation ([place])   . CHF 06/19/2008  . COUGH DUE TO ACE INHIBITORS 10/05/2008  . DIABETES MELLITUS, TYPE II 09/22/2006  . ED (erectile dysfunction)   . Glaucoma   . GOUT 09/22/2006  . Hx of adenomatous colonic polyps 06/09/2015  . HYPERCHOLESTEROLEMIA 07/02/2008  . HYPERTENSION 05/31/2009  . HYPOGONADISM, MALE 01/15/2007  . Hypokalemia   . 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 Lymphadenopathy   . Peripheral neuropathy   . PUD (peptic ulcer disease)   . Rheumatoid arthritis(714.0) 09/22/2006    Past Surgical History:  Procedure Laterality Date  . CATARACT EXTRACTION, BILATERAL    . COLONOSCOPY  2004  . LYMPH NODE BIOPSY    . LYMPH NODE BIOPSY Right 10/16/2014   Procedure: RIGHT INGUINAL LYMPH NODE BIOPSY;  Surgeon: Aviva Signs, MD;  Location: [facility];  Service: General;  Laterality: Right;    Social History   Socioeconomic History  . Marital status: Married    Spouse name: Not on file  . Number of children: 3  . Years of education: Not on file  . Highest education level: Not on file  Social Needs  . Financial resource strain: Not on file  . Food insecurity - worry: Not on file  . Food insecurity - inability: Not on file  .  Transportation needs - medical: Not on file  . Transportation needs - non-medical: Not on file  Occupational History  . Occupation: TEFL teacher: [employer]  Tobacco Use  . Smoking status: Former Smoker    Last attempt to quit: 11/02/1999    Years since quitting: 17.4  . Smokeless tobacco: Never Used  Substance and Sexual Activity  . Alcohol use: No    Alcohol/week: 0.0 oz  . Drug use: No  . Sexual activity: Not on file  Other Topics Concern  . Not on file  Social History Narrative   Regular exercise-yes    Current Outpatient Medications on File Prior to Visit  Medication Sig Dispense Refill  . ACCU-CHEK FASTCLIX LANCETS MISC TEST TWO TIMES DAILY 204 each 2  . ACCU-CHEK SMARTVIEW test strip USE TWICE DAILY 360 each 2  . Alcohol Swabs (B-D SINGLE USE SWABS REGULAR) PADS Use to check blood sugar two times per day. 200 each 2  . aspirin 81 MG tablet Take 81 mg by mouth daily.      Marland Kitchen atorvastatin (LIPITOR) 10 MG tablet Take 1 tablet (10 mg total) by mouth daily. 90 tablet 3  . BD INSULIN SYRINGE ULTRAFINE 31G X 5/16" 0.5 ML MISC Use with insulin. 100 each 5  . Blood Glucose Monitoring Suppl (ACCU-CHEK NANO SMARTVIEW) w/Device KIT Use to test blood sugar daily  1 kit 1  . clindamycin (CLEOCIN) 300 MG capsule Take one tablet 4 times daily. 40 capsule 0  . erythromycin base (E-MYCIN) 500 MG tablet Take 1 tablet (500 mg total) by mouth 4 (four) times daily. 28 tablet 0  . gabapentin (NEURONTIN) 300 MG capsule TAKE 1 CAPSULE TWICE DAILY 180 capsule 2  . HYDROcodone-acetaminophen (NORCO/VICODIN) 5-325 MG tablet Take 1 tablet by mouth every 4 (four) hours as needed for moderate pain.    Marland Kitchen insulin NPH Human (HUMULIN N,NOVOLIN N) 100 UNIT/ML injection Inject 0.28 mLs (28 Units total) every morning into the skin. 30 mL 2  . insulin regular (NOVOLIN R RELION) 100 units/mL injection Inject 0.05 mLs (5 Units total) daily with supper into the skin. And syringes 2/day 10 mL 11  . losartan  (COZAAR) 50 MG tablet TAKE 1 TABLET EVERY DAY 90 tablet 3  . MONOJECT ULTRA COMFORT SYRINGE 29G X 1/2" 1 ML MISC USE TO INJECT INSULIN 4 TIMES PER DAY 360 each 3  . triamcinolone cream (KENALOG) 0.1 % Apply 1 application topically 3 (three) times daily. As needed for rash. 45 g 2  . triamcinolone cream (KENALOG) 0.1 % Apply 1 application topically 2 (two) times daily. 30 g 0   No current facility-administered medications on file prior to visit.     Allergies  Allergen Reactions  . Penicillins Swelling    Swelling of the hands   . Terbinafine Hcl Hives  . Zestril [Lisinopril] Cough  . Ciprofloxacin Rash    Family History  Problem Relation Age of Onset  . Heart disease Other        FH of CAD  . Colon polyps Paternal Uncle   . Kidney disease Paternal Uncle   . Diabetes Paternal Grandfather   . Diabetes Maternal Uncle   . Cancer Neg Hx   . Colon cancer Neg Hx   . Gallbladder disease Neg Hx   . Esophageal cancer Neg Hx     BP 132/80 (BP Location: Left Arm, Patient Position: Sitting, Cuff Size: Normal)   Pulse 81   Wt 228 lb 12.8 oz (103.8 kg)   SpO2 96%   BMI 31.47 kg/m    Review of Systems He denies LOC    Objective:   Physical Exam VITAL SIGNS:  See vs page.  GENERAL: no distress.  Pulses: foot pulses are intact bilaterally.   MSK: no deformity of the feet or ankles, except the right 3rd toe is absent.  CV: trace bilat edema of the legs.  Skin:  no ulcer on the feet or ankles.  normal color and temp on the feet and ankles.  healed surgical scar at the right foot. Neuro: sensation is intact to touch on the feet and ankles, but decreased from normal.  Ext: There is bilateral onychomycosis of the toenails.    Lab Results  Component Value Date   HGBA1C 7.2 04/10/2017      Assessment & Plan:  Insulin-requiring type 2 DM: this is the best control this pt should aim for, given this regimen, which does match insulin to her changing needs throughout the  day. Hypoglycemia: this is limiting glycemic control.   Patient Instructions  Please continue the same insulins.   Please come back for a follow-up appointment in 3 months.    check your blood sugar twice a day.  vary the time of day when you check, between before the 3 meals, and at bedtime.  also check if you have symptoms of your  blood sugar being too high or too low.  please keep a record of the readings and bring it to your next appointment here (or you can bring the meter itself).  You can write it on any piece of paper.  please call us sooner if your blood sugar goes below 70, or if you have a lot of readings over 200.

## 2017-04-10 NOTE — Patient Instructions (Addendum)
Please continue the same insulins.   Please come back for a follow-up appointment in 3 months.    check your blood sugar twice a day.  vary the time of day when you check, between before the 3 meals, and at bedtime.  also check if you have symptoms of your blood sugar being too high or too low.  please keep a record of the readings and bring it to your next appointment here (or you can bring the meter itself).  You can write it on any piece of paper.  please call us sooner if your blood sugar goes below 70, or if you have a lot of readings over 200.

## 2017-04-23 ENCOUNTER — Other Ambulatory Visit: Payer: Self-pay | Admitting: Endocrinology

## 2017-07-09 ENCOUNTER — Encounter: Payer: Self-pay | Admitting: Endocrinology

## 2017-07-09 ENCOUNTER — Ambulatory Visit (INDEPENDENT_AMBULATORY_CARE_PROVIDER_SITE_OTHER): Payer: Medicare HMO | Admitting: Endocrinology

## 2017-07-09 VITALS — BP 152/80 | HR 77 | Wt 233.2 lb

## 2017-07-09 DIAGNOSIS — Z794 Long term (current) use of insulin: Secondary | ICD-10-CM

## 2017-07-09 DIAGNOSIS — E11319 Type 2 diabetes mellitus with unspecified diabetic retinopathy without macular edema: Secondary | ICD-10-CM

## 2017-07-09 LAB — POCT GLYCOSYLATED HEMOGLOBIN (HGB A1C): Hemoglobin A1C: 6.6

## 2017-07-09 MED ORDER — LOSARTAN POTASSIUM 100 MG PO TABS: 100.0000 mg | ORAL_TABLET | Freq: Every day | ORAL | 3 refills | Status: DC

## 2017-07-09 MED ORDER — INSULIN NPH (HUMAN) (ISOPHANE) 100 UNIT/ML ~~LOC~~ SUSP: 26.0000 [IU] | SUBCUTANEOUS | 2 refills | Status: DC

## 2017-07-09 NOTE — Progress Notes (Signed)
Subjective:    Patient ID: Aaron Fox, male    DOB: [birth year], 62 y.o.   MRN: [id number]  HPI Pt returns for f/u of diabetes mellitus: DM type: 1 Dx'ed: 6945 Complications: polyneuropathy, retinopathy, PAD, and renal insufficiency.  Therapy: insulin since 2003 DKA: never.  Severe hypoglycemia: never Pancreatitis: never.  Other: he takes 2 QD insulins, after poor results with multiple daily injections; he takes human insulin, due to cost.   Interval history: no cbg record, but he seldom has mild hypoglycemia.  This happens at lunch.  It is highest in the afternoon.  pt states he feels well in general.   Past Medical History:  Diagnosis Date  . ALLERGIC RHINITIS 07/02/2008  . Atrial fibrillation ([place])   . CHF 06/19/2008  . COUGH DUE TO ACE INHIBITORS 10/05/2008  . DIABETES MELLITUS, TYPE II 09/22/2006  . ED (erectile dysfunction)   . Glaucoma   . GOUT 09/22/2006  . Hx of adenomatous colonic polyps 06/09/2015  . HYPERCHOLESTEROLEMIA 07/02/2008  . HYPERTENSION 05/31/2009  . HYPOGONADISM, MALE 01/15/2007  . Hypokalemia   . 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 Lymphadenopathy   . Peripheral neuropathy   . PUD (peptic ulcer disease)   . Rheumatoid arthritis(714.0) 09/22/2006    Past Surgical History:  Procedure Laterality Date  . CATARACT EXTRACTION, BILATERAL    . COLONOSCOPY  2004  . LYMPH NODE BIOPSY    . LYMPH NODE BIOPSY Right 10/16/2014   Procedure: RIGHT INGUINAL LYMPH NODE BIOPSY;  Surgeon: Aviva Signs, MD;  Location: [facility];  Service: General;  Laterality: Right;    Social History   Socioeconomic History  . Marital status: Married    Spouse name: Not on file  . Number of children: 3  . Years of education: Not on file  . Highest education level: Not on file  Occupational History  . 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 Financial resource strain: Not on file  . Food insecurity:    Worry: Not on file    Inability: Not on file   . Transportation needs:    Medical: Not on file    Non-medical: Not on file  Tobacco Use  . Smoking status: Former Smoker    Last attempt to quit: 11/02/1999    Years since quitting: 17.6  . Smokeless tobacco: Never Used  Substance and Sexual Activity  . Alcohol use: No    Alcohol/week: 0.0 oz  . Drug use: No  . Sexual activity: Not on file  Lifestyle  . Physical activity:    Days per week: Not on file    Minutes per session: Not on file  . Stress: Not on file  Relationships  . Social connections:    Talks on phone: Not on file    Gets together: Not on file    Attends religious service: Not on file    Active member of club or organization: Not on file    Attends meetings of clubs or organizations: Not on file    Relationship status: Not on file  . Intimate partner violence:    Fear of current or ex partner: Not on file    Emotionally abused: Not on file    Physically abused: Not on file    Forced sexual activity: Not on file  Other Topics Concern  . Not on file  Social History Narrative   Regular exercise-yes    Current Outpatient Medications on File Prior  to Visit  Medication Sig Dispense Refill  . ACCU-CHEK FASTCLIX LANCETS MISC TEST TWO TIMES DAILY 204 each 2  . ACCU-CHEK SMARTVIEW test strip USE TWICE DAILY 360 each 2  . Alcohol Swabs (B-D SINGLE USE SWABS REGULAR) PADS Use to check blood sugar two times per day. 200 each 2  . aspirin 81 MG tablet Take 81 mg by mouth daily.      Marland Kitchen atorvastatin (LIPITOR) 10 MG tablet TAKE 1 TABLET EVERY DAY 90 tablet 3  . BD INSULIN SYRINGE ULTRAFINE 31G X 5/16" 0.5 ML MISC Use with insulin. 100 each 5  . Blood Glucose Monitoring Suppl (ACCU-CHEK NANO SMARTVIEW) w/Device KIT Use to test blood sugar daily 1 kit 1  . gabapentin (NEURONTIN) 300 MG capsule TAKE 1 CAPSULE TWICE DAILY 180 capsule 2  . insulin regular (NOVOLIN R RELION) 100 units/mL injection Inject 0.05 mLs (5 Units total) daily with supper into the skin. And syringes  2/day 10 mL 11  . MONOJECT ULTRA COMFORT SYRINGE 29G X 1/2" 1 ML MISC USE TO INJECT INSULIN 4 TIMES PER DAY 360 each 3  . triamcinolone cream (KENALOG) 0.1 % Apply 1 application topically 3 (three) times daily. As needed for rash. 45 g 2  . triamcinolone cream (KENALOG) 0.1 % Apply 1 application topically 2 (two) times daily. 30 g 0   No current facility-administered medications on file prior to visit.     Allergies  Allergen Reactions  . Penicillins Swelling    Swelling of the hands   . Terbinafine Hcl Hives  . Zestril [Lisinopril] Cough  . Ciprofloxacin Rash    Family History  Problem Relation Age of Onset  . Heart disease Other        FH of CAD  . Colon polyps Paternal Uncle   . Kidney disease Paternal Uncle   . Diabetes Paternal Grandfather   . Diabetes Maternal Uncle   . Cancer Neg Hx   . Colon cancer Neg Hx   . Gallbladder disease Neg Hx   . Esophageal cancer Neg Hx     BP (!) 152/80   Pulse 77   Wt 233 lb 3.2 oz (105.8 kg)   SpO2 97%   BMI 32.07 kg/m    Review of Systems Denies LOC    Objective:   Physical Exam VITAL SIGNS:  See vs page.  GENERAL: no distress.  Pulses: foot pulses are intact bilaterally.   MSK: no deformity of the feet or ankles, except the right 3rd toe is absent.  CV: trace bilat edema of the legs.  Skin:  no ulcer on the feet or ankles.  normal color and temp on the feet and ankles.  healed surgical scar at the right foot.  Neuro: sensation is intact to touch on the feet and ankles, but decreased from normal.  Ext: There is bilateral onychomycosis of the toenails.    Lab Results  Component Value Date   CREATININE 1.30 (H) 08/11/2016   BUN 20 04/28/2016   NA 138 04/28/2016   K 4.5 04/28/2016   CL 106 04/28/2016   CO2 23 04/28/2016   A1c=6.6%    Assessment & Plan:  HTN: he needs increased rx type 1 DM, with PAD: well-controlled. Renal insuff: in this setting, he needs to take NPH as his time-release qd insulin   Patient  Instructions  Please continue the same insulins.   I have sent a prescription to your pharmacy, to increase the losartan. Please come back for a regular  physical appointment in 3 months.    check your blood sugar twice a day.  vary the time of day when you check, between before the 3 meals, and at bedtime.  also check if you have symptoms of your blood sugar being too high or too low.  please keep a record of the readings and bring it to your next appointment here (or you can bring the meter itself).  You can write it on any piece of paper.  please call us sooner if your blood sugar goes below 70, or if you have a lot of readings over 200.

## 2017-07-09 NOTE — Patient Instructions (Addendum)
Please continue the same insulins.   I have sent a prescription to your pharmacy, to increase the losartan. Please come back for a regular physical appointment in 3 months.    check your blood sugar twice a day.  vary the time of day when you check, between before the 3 meals, and at bedtime.  also check if you have symptoms of your blood sugar being too high or too low.  please keep a record of the readings and bring it to your next appointment here (or you can bring the meter itself).  You can write it on any piece of paper.  please call us sooner if your blood sugar goes below 70, or if you have a lot of readings over 200.

## 2017-07-16 ENCOUNTER — Telehealth: Payer: Self-pay | Admitting: Endocrinology

## 2017-07-16 ENCOUNTER — Other Ambulatory Visit: Payer: Self-pay

## 2017-07-16 NOTE — Telephone Encounter (Signed)
Patient stated that the blood pressure medication was called into the wrong pharmacy and should have went to   [place], [place]

## 2017-07-16 NOTE — Telephone Encounter (Signed)
LVM asking that patient call back to clarify pharmacy because prescription was sent to [facility] on 5/13 with receipt confirmed.

## 2017-08-03 ENCOUNTER — Telehealth: Payer: Self-pay | Admitting: Endocrinology

## 2017-08-03 NOTE — Telephone Encounter (Signed)
Ov now 

## 2017-08-03 NOTE — Telephone Encounter (Signed)
I called patient & stated that since no appointments were available that he should go to urgent care.

## 2017-08-03 NOTE — Telephone Encounter (Signed)
Patient stated that one of his toenails has come off. He states there is now a spot on his toe that he is concerned about since he has DM.  He states that the toe is swollen and the toe beside it is sore also  He would like toknow what he should do or if he needs to come in for the dr to look at this,.

## 2017-08-14 ENCOUNTER — Ambulatory Visit (INDEPENDENT_AMBULATORY_CARE_PROVIDER_SITE_OTHER): Payer: Medicare HMO

## 2017-08-14 ENCOUNTER — Other Ambulatory Visit: Payer: Self-pay

## 2017-08-14 ENCOUNTER — Ambulatory Visit: Payer: Medicare HMO | Admitting: Podiatry

## 2017-08-14 ENCOUNTER — Encounter

## 2017-08-14 DIAGNOSIS — L97529 Non-pressure chronic ulcer of other part of left foot with unspecified severity: Secondary | ICD-10-CM

## 2017-08-14 DIAGNOSIS — L03119 Cellulitis of unspecified part of limb: Secondary | ICD-10-CM

## 2017-08-14 DIAGNOSIS — L02619 Cutaneous abscess of unspecified foot: Secondary | ICD-10-CM

## 2017-08-14 MED ORDER — CLINDAMYCIN HCL 300 MG PO CAPS: 300.0000 mg | ORAL_CAPSULE | Freq: Three times a day (TID) | ORAL | 2 refills | Status: DC

## 2017-08-14 MED ORDER — MUPIROCIN 2 % EX OINT: 1.0000 "application " | TOPICAL_OINTMENT | Freq: Two times a day (BID) | CUTANEOUS | 2 refills | Status: DC

## 2017-08-14 NOTE — Progress Notes (Signed)
Subjective: 62 year old male presents the office today for concerns of a wound to the left fourth toe.  He states that he has had a callus to the area.  He was soaking his foot he noticed some skin came off and since then he has had some drainage and some discomfort of the toe.  He has had some mild swelling to the toe but denies any redness or red streaks or any foul odor.  He states the right foot cleared up with a steroid cream. Denies any systemic complaints such as fevers, chills, nausea, vomiting. No acute changes since last appointment, and no other complaints at this time.   Objective: AAO x3, NAD DP/PT pulses palpable bilaterally, CRT less than 3 seconds There is mild swelling to the left fourth toe but there is no significant erythema or increase in warmth.  There is a hyperkeratotic lesion to the distal portion of the toe with evidence what appears to be an old blister underneath the area.  Unable to debride this reveals superficial wound present to the distal portion of the toe measuring approximate 0.5 x 0.4 cm and superficial without any probing, undermining or tunneling.  There is no fluctuation or crepitation.  There is no malodor.  No ascending cellulitis.  Hammertoe deformities are present.  Right foot is well-healed. No open lesions or pre-ulcerative lesions.  No pain with calf compression, swelling, warmth, erythema  Assessment: Ulceration left fourth toe  Plan: -All treatment options discussed with the patient including all alternatives, risks, complications.  -X-rays were obtained and reviewed.  Given the hammertoe contractures is difficult to evaluate however there is no obvious signs of osteomyelitis there is no soft tissue emphysema present. -After the area was cleaned I utilized a #312 blade scalpel to debride the hyperkeratotic and nonviable tissue to reveal the underlying ulceration.  Antibiotic ointment was applied.  The use mupirocin ointment which I prescribed.  Also I  prescribed clindamycin for him as well.  I tried to take pressure at the distribution of the toe. -Patient encouraged to call the office with any questions, concerns, change in symptoms.   Return in about 1 week (around 08/21/2017).  Trula Slade DPM

## 2017-08-24 ENCOUNTER — Ambulatory Visit: Payer: Medicare HMO | Admitting: Podiatry

## 2017-10-15 ENCOUNTER — Encounter: Payer: Self-pay | Admitting: Endocrinology

## 2017-10-15 ENCOUNTER — Ambulatory Visit (INDEPENDENT_AMBULATORY_CARE_PROVIDER_SITE_OTHER): Payer: Medicare HMO | Admitting: Endocrinology

## 2017-10-15 VITALS — BP 144/78 | HR 71 | Temp 98.6°F | Ht 71.5 in | Wt 229.4 lb

## 2017-10-15 DIAGNOSIS — Z0001 Encounter for general adult medical examination with abnormal findings: Secondary | ICD-10-CM

## 2017-10-15 DIAGNOSIS — Z794 Long term (current) use of insulin: Secondary | ICD-10-CM

## 2017-10-15 DIAGNOSIS — R972 Elevated prostate specific antigen [PSA]: Secondary | ICD-10-CM

## 2017-10-15 DIAGNOSIS — E10319 Type 1 diabetes mellitus with unspecified diabetic retinopathy without macular edema: Secondary | ICD-10-CM | POA: Diagnosis not present

## 2017-10-15 DIAGNOSIS — D649 Anemia, unspecified: Secondary | ICD-10-CM | POA: Diagnosis not present

## 2017-10-15 DIAGNOSIS — Z125 Encounter for screening for malignant neoplasm of prostate: Secondary | ICD-10-CM

## 2017-10-15 DIAGNOSIS — D509 Iron deficiency anemia, unspecified: Secondary | ICD-10-CM

## 2017-10-15 DIAGNOSIS — E11319 Type 2 diabetes mellitus with unspecified diabetic retinopathy without macular edema: Secondary | ICD-10-CM

## 2017-10-15 DIAGNOSIS — Z0000 Encounter for general adult medical examination without abnormal findings: Secondary | ICD-10-CM

## 2017-10-15 DIAGNOSIS — M109 Gout, unspecified: Secondary | ICD-10-CM

## 2017-10-15 DIAGNOSIS — E1051 Type 1 diabetes mellitus with diabetic peripheral angiopathy without gangrene: Secondary | ICD-10-CM | POA: Diagnosis not present

## 2017-10-15 DIAGNOSIS — E1029 Type 1 diabetes mellitus with other diabetic kidney complication: Secondary | ICD-10-CM

## 2017-10-15 DIAGNOSIS — Z23 Encounter for immunization: Secondary | ICD-10-CM | POA: Diagnosis not present

## 2017-10-15 LAB — LIPID PANEL
CHOLESTEROL: 131 mg/dL (ref 0–200)
HDL: 33.8 mg/dL — AB (ref >39.00)
LDL Cholesterol: 65 mg/dL (ref 0–99)
NonHDL: 97.57
TRIGLYCERIDES: 165 mg/dL — AB (ref 0.0–149.0)
Total CHOL/HDL Ratio: 4
VLDL: 33 mg/dL (ref 0.0–40.0)

## 2017-10-15 LAB — HEPATIC FUNCTION PANEL
ALT: 31 U/L (ref 0–53)
AST: 36 U/L (ref 0–37)
Albumin: 3.8 g/dL (ref 3.5–5.2)
Alkaline Phosphatase: 94 U/L (ref 39–117)
BILIRUBIN DIRECT: 0.1 mg/dL (ref 0.0–0.3)
BILIRUBIN TOTAL: 0.8 mg/dL (ref 0.2–1.2)
Total Protein: 7 g/dL (ref 6.0–8.3)

## 2017-10-15 LAB — BASIC METABOLIC PANEL
BUN: 19 mg/dL (ref 6–23)
CHLORIDE: 106 meq/L (ref 96–112)
CO2: 26 meq/L (ref 19–32)
Calcium: 9.4 mg/dL (ref 8.4–10.5)
Creatinine, Ser: 1.19 mg/dL (ref 0.40–1.50)
GFR: 79.79 mL/min (ref >60.00)
Glucose, Bld: 180 mg/dL — ABNORMAL HIGH (ref 70–99)
POTASSIUM: 4.2 meq/L (ref 3.5–5.1)
Sodium: 140 mEq/L (ref 135–145)

## 2017-10-15 LAB — CBC WITH DIFFERENTIAL/PLATELET
BASOS ABS: 0.1 10*3/uL (ref 0.0–0.1)
Basophils Relative: 1.3 % (ref 0.0–3.0)
EOS ABS: 0.2 10*3/uL (ref 0.0–0.7)
Eosinophils Relative: 5.8 % — ABNORMAL HIGH (ref 0.0–5.0)
HCT: 34.5 % — ABNORMAL LOW (ref 39.0–52.0)
HEMOGLOBIN: 11.7 g/dL — AB (ref 13.0–17.0)
LYMPHS PCT: 33.2 % (ref 12.0–46.0)
Lymphs Abs: 1.4 10*3/uL (ref 0.7–4.0)
MCHC: 33.9 g/dL (ref 30.0–36.0)
MCV: 83.1 fl (ref 78.0–100.0)
Monocytes Absolute: 0.5 10*3/uL (ref 0.1–1.0)
Monocytes Relative: 11.6 % (ref 3.0–12.0)
Neutro Abs: 2 10*3/uL (ref 1.4–7.7)
Neutrophils Relative %: 48.1 % (ref 43.0–77.0)
Platelets: 169 10*3/uL (ref 150.0–400.0)
RBC: 4.16 Mil/uL — AB (ref 4.22–5.81)
RDW: 13.9 % (ref 11.5–15.5)
WBC: 4.2 10*3/uL (ref 4.0–10.5)

## 2017-10-15 LAB — URINALYSIS, ROUTINE W REFLEX MICROSCOPIC
BILIRUBIN URINE: NEGATIVE
KETONES UR: NEGATIVE
LEUKOCYTES UA: NEGATIVE
Nitrite: NEGATIVE
Specific Gravity, Urine: 1.025 (ref 1.000–1.030)
Total Protein, Urine: 30 — AB
UROBILINOGEN UA: 0.2 (ref 0.0–1.0)
Urine Glucose: NEGATIVE
pH: 6 (ref 5.0–8.0)

## 2017-10-15 LAB — IBC PANEL
IRON: 85 ug/dL (ref 42–165)
SATURATION RATIOS: 31.5 % (ref 20.0–50.0)
TRANSFERRIN: 193 mg/dL — AB (ref 212.0–360.0)

## 2017-10-15 LAB — URIC ACID: Uric Acid, Serum: 6.9 mg/dL (ref 4.0–7.8)

## 2017-10-15 LAB — POCT GLYCOSYLATED HEMOGLOBIN (HGB A1C): HEMOGLOBIN A1C: 10.2 % — AB (ref 4.0–5.6)

## 2017-10-15 LAB — MICROALBUMIN / CREATININE URINE RATIO
CREATININE, U: 209.4 mg/dL
Microalb Creat Ratio: 8.3 mg/g (ref 0.0–30.0)
Microalb, Ur: 17.5 mg/dL — ABNORMAL HIGH (ref 0.0–1.9)

## 2017-10-15 LAB — TSH: TSH: 1.1 u[IU]/mL (ref 0.35–4.50)

## 2017-10-15 LAB — PSA: PSA: 2.13 ng/mL (ref 0.10–4.00)

## 2017-10-15 MED ORDER — GABAPENTIN 300 MG PO CAPS: 300.0000 mg | ORAL_CAPSULE | Freq: Two times a day (BID) | ORAL | 2 refills | Status: DC

## 2017-10-15 NOTE — Patient Instructions (Addendum)
good diet and exercise significantly improve the control of your diabetes.  please let me know if you wish to be referred to a dietician.  high blood sugar is very risky to your health.  you should see an eye doctor and dentist every year.  It is very important to get all recommended vaccinations.  blood tests are requested for you today.  We'll let you know about the results. Please continue the same insulins for now. check your blood sugar twice a day.  vary the time of day when you check, between before the 3 meals, and at bedtime.  also check if you have symptoms of your blood sugar being too high or too low.  please keep a record of the readings and bring it to your next appointment here (or you can bring the meter itself).  You can write it on any piece of paper.  please call us sooner if your blood sugar goes below 70, or if you have a lot of readings over 200. Please come back for a follow-up appointment in 2 months.

## 2017-10-15 NOTE — Progress Notes (Signed)
Subjective:    Patient ID: Aaron Fox, male    DOB: [birth year], 62 y.o.   MRN: [id number]  HPI Pt is here for regular wellness examination, and is feeling pretty well in general, and says chronic med probs are stable, except as noted below Past Medical History:  Diagnosis Date  . ALLERGIC RHINITIS 07/02/2008  . Atrial fibrillation ([place])   . CHF 06/19/2008  . COUGH DUE TO ACE INHIBITORS 10/05/2008  . DIABETES MELLITUS, TYPE II 09/22/2006  . ED (erectile dysfunction)   . Glaucoma   . GOUT 09/22/2006  . Hx of adenomatous colonic polyps 06/09/2015  . HYPERCHOLESTEROLEMIA 07/02/2008  . HYPERTENSION 05/31/2009  . HYPOGONADISM, MALE 01/15/2007  . Hypokalemia   . 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 Lymphadenopathy   . Peripheral neuropathy   . PUD (peptic ulcer disease)   . Rheumatoid arthritis(714.0) 09/22/2006    Past Surgical History:  Procedure Laterality Date  . CATARACT EXTRACTION, BILATERAL    . COLONOSCOPY  2004  . LYMPH NODE BIOPSY    . LYMPH NODE BIOPSY Right 10/16/2014   Procedure: RIGHT INGUINAL LYMPH NODE BIOPSY;  Surgeon: Aviva Signs, MD;  Location: [facility];  Service: General;  Laterality: Right;    Social History   Socioeconomic History  . Marital status: Married    Spouse name: Not on file  . Number of children: 3  . Years of education: Not on file  . Highest education level: Not on file  Occupational History  . Occupation: TEFL teacher: [employer]  Social Needs  . Financial resource strain: Not on file  . Food insecurity:    Worry: Not on file    Inability: Not on file  . Transportation needs:    Medical: Not on file    Non-medical: Not on file  Tobacco Use  . Smoking status: Former Smoker    Last attempt to quit: 11/02/1999    Years since quitting: 17.9  . Smokeless tobacco: Never Used  Substance and Sexual Activity  . Alcohol use: No    Alcohol/week: 0.0 standard drinks  . Drug use: No  . Sexual activity: Not on file  Lifestyle   . Physical activity:    Days per week: Not on file    Minutes per session: Not on file  . Stress: Not on file  Relationships  . Social connections:    Talks on phone: Not on file    Gets together: Not on file    Attends religious service: Not on file    Active member of club or organization: Not on file    Attends meetings of clubs or organizations: Not on file    Relationship status: Not on file  . Intimate partner violence:    Fear of current or ex partner: Not on file    Emotionally abused: Not on file    Physically abused: Not on file    Forced sexual activity: Not on file  Other Topics Concern  . Not on file  Social History Narrative   Regular exercise-yes    Current Outpatient Medications on File Prior to Visit  Medication Sig Dispense Refill  . ACCU-CHEK FASTCLIX LANCETS MISC TEST TWO TIMES DAILY 204 each 2  . ACCU-CHEK SMARTVIEW test strip USE TWICE DAILY 360 each 2  . Alcohol Swabs (B-D SINGLE USE SWABS REGULAR) PADS Use to check blood sugar two times per day. 200 each 2  . aspirin 81 MG tablet Take 81  mg by mouth daily.      . Aspirin-Calcium Carbonate 81-777 MG TABS Take by mouth.    Marland Kitchen atorvastatin (LIPITOR) 10 MG tablet TAKE 1 TABLET EVERY DAY 90 tablet 3  . BD INSULIN SYRINGE ULTRAFINE 31G X 5/16" 0.5 ML MISC Use with insulin. 100 each 5  . Blood Glucose Monitoring Suppl (ACCU-CHEK NANO SMARTVIEW) w/Device KIT Use to test blood sugar daily 1 kit 1  . insulin NPH Human (HUMULIN N,NOVOLIN N) 100 UNIT/ML injection Inject 0.26 mLs (26 Units total) into the skin every morning. 30 mL 2  . insulin regular (NOVOLIN R RELION) 100 units/mL injection Inject 0.05 mLs (5 Units total) daily with supper into the skin. And syringes 2/day 10 mL 11  . losartan (COZAAR) 100 MG tablet Take 1 tablet (100 mg total) by mouth daily. 90 tablet 3  . MONOJECT ULTRA COMFORT SYRINGE 29G X 1/2" 1 ML MISC USE TO INJECT INSULIN 4 TIMES PER DAY 360 each 3  . mupirocin ointment (BACTROBAN) 2 %  Apply 1 application topically 2 (two) times daily. 30 g 2  . triamcinolone cream (KENALOG) 0.1 % Apply 1 application topically 2 (two) times daily. 30 g 0   No current facility-administered medications on file prior to visit.     Allergies  Allergen Reactions  . Penicillins Swelling    Swelling of the hands   . Terbinafine Hcl Hives  . Zestril [Lisinopril] Cough  . Ciprofloxacin Rash  . Terbinafine Hcl Hives    Family History  Problem Relation Age of Onset  . Heart disease Other        FH of CAD  . Colon polyps Paternal Uncle   . Kidney disease Paternal Uncle   . Diabetes Paternal Grandfather   . Diabetes Maternal Uncle   . Cancer Neg Hx   . Colon cancer Neg Hx   . Gallbladder disease Neg Hx   . Esophageal cancer Neg Hx     BP (!) 144/78 (BP Location: Right Arm, Patient Position: Sitting, Cuff Size: Large)   Pulse 71   Temp 98.6 F (37 C) (Oral)   Ht 5' 11.5" (1.816 m)   Wt 229 lb 6.4 oz (104.1 kg)   SpO2 97%   BMI 31.55 kg/m    Review of Systems Denies fever, fatigue, visual loss, hearing loss, chest pain, sob, back pain, depression, cold intolerance, BRBPR, hematuria, allergy sxs, easy bruising, and rash.     Objective:   Physical Exam VS: see vs page GEN: no distress HEAD: head: no deformity eyes: no periorbital swelling, no proptosis external nose and ears are normal mouth: no lesion seen NECK: supple, thyroid is not enlarged CHEST WALL: no deformity LUNGS: clear to auscultation CV: reg rate and rhythm, no murmur ABD: abdomen is soft, nontender.  no hepatosplenomegaly.  not distended.  no hernia MUSCULOSKELETAL: muscle bulk and strength are grossly normal.  no obvious joint swelling.  gait is normal and steady PULSES: no carotid bruit NEURO:  cn 2-12 grossly intact.   readily moves all 4's.   SKIN:  Normal texture and temperature.  No rash or suspicious lesion is visible.   NODES:  None palpable at the neck PSYCH: alert, well-oriented.  Does not  appear anxious nor depressed.   I personally reviewed electrocardiogram tracing (today): Indication: wellness Impression: NSR.  No MI.  No hypertrophy. Compared to 2017: no significant change       Assessment & Plan:  Wellness visit today, with problems stable, except as  noted.   SEPARATE EVALUATION FOLLOWS--EACH PROBLEM HERE IS NEW, NOT RESPONDING TO TREATMENT, OR POSES SIGNIFICANT RISK TO THE PATIENT'S HEALTH: HISTORY OF THE PRESENT ILLNESS: Pt returns for f/u of diabetes mellitus: DM type: 1 Dx'ed: 3151 Complications: polyneuropathy, retinopathy, PAD, and renal insufficiency.  Therapy: insulin since 2003 DKA: never.  Severe hypoglycemia: never Pancreatitis: never.  Other: he takes 2 QD insulins, after poor results with multiple daily injections; he takes human insulin, due to cost.   Interval history: no cbg record, but states cbg's are well-controlled.  It is seldom low, and these episodes are mild.  He says cbg is highest before lunch.  Pt says he never misses the insulin.  No recent steroids.  PAST MEDICAL HISTORY Past Medical History:  Diagnosis Date  . ALLERGIC RHINITIS 07/02/2008  . Atrial fibrillation ([place])   . CHF 06/19/2008  . COUGH DUE TO ACE INHIBITORS 10/05/2008  . DIABETES MELLITUS, TYPE II 09/22/2006  . ED (erectile dysfunction)   . Glaucoma   . GOUT 09/22/2006  . Hx of adenomatous colonic polyps 06/09/2015  . HYPERCHOLESTEROLEMIA 07/02/2008  . HYPERTENSION 05/31/2009  . HYPOGONADISM, MALE 01/15/2007  . Hypokalemia   . 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 Lymphadenopathy   . Peripheral neuropathy   . PUD (peptic ulcer disease)   . Rheumatoid arthritis(714.0) 09/22/2006    Past Surgical History:  Procedure Laterality Date  . CATARACT EXTRACTION, BILATERAL    . COLONOSCOPY  2004  . LYMPH NODE BIOPSY    . LYMPH NODE BIOPSY Right 10/16/2014   Procedure: RIGHT INGUINAL LYMPH NODE BIOPSY;  Surgeon: Aviva Signs, MD;  Location: [facility];  Service: General;   Laterality: Right;    Social History   Socioeconomic History  . Marital status: Married    Spouse name: Not on file  . Number of children: 3  . Years of education: Not on file  . Highest education level: Not on file  Occupational History  . 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 Financial resource strain: Not on file  . Food insecurity:    Worry: Not on file    Inability: Not on file  . Transportation needs:    Medical: Not on file    Non-medical: Not on file  Tobacco Use  . Smoking status: Former Smoker    Last attempt to quit: 11/02/1999    Years since quitting: 17.9  . Smokeless tobacco: Never Used  Substance and Sexual Activity  . Alcohol use: No    Alcohol/week: 0.0 standard drinks  . Drug use: No  . Sexual activity: Not on file  Lifestyle  . Physical activity:    Days per week: Not on file    Minutes per session: Not on file  . Stress: Not on file  Relationships  . Social connections:    Talks on phone: Not on file    Gets together: Not on file    Attends religious service: Not on file    Active member of club or organization: Not on file    Attends meetings of clubs or organizations: Not on file    Relationship status: Not on file  . Intimate partner violence:    Fear of current or ex partner: Not on file    Emotionally abused: Not on file    Physically abused: Not on file    Forced sexual activity: Not on file  Other Topics Concern  . Not on file  Social  History Narrative   Regular exercise-yes    Current Outpatient Medications on File Prior to Visit  Medication Sig Dispense Refill  . ACCU-CHEK FASTCLIX LANCETS MISC TEST TWO TIMES DAILY 204 each 2  . ACCU-CHEK SMARTVIEW test strip USE TWICE DAILY 360 each 2  . Alcohol Swabs (B-D SINGLE USE SWABS REGULAR) PADS Use to check blood sugar two times per day. 200 each 2  . aspirin 81 MG tablet Take 81 mg by mouth daily.      . Aspirin-Calcium Carbonate 81-777 MG TABS Take by  mouth.    Marland Kitchen atorvastatin (LIPITOR) 10 MG tablet TAKE 1 TABLET EVERY DAY 90 tablet 3  . BD INSULIN SYRINGE ULTRAFINE 31G X 5/16" 0.5 ML MISC Use with insulin. 100 each 5  . Blood Glucose Monitoring Suppl (ACCU-CHEK NANO SMARTVIEW) w/Device KIT Use to test blood sugar daily 1 kit 1  . insulin NPH Human (HUMULIN N,NOVOLIN N) 100 UNIT/ML injection Inject 0.26 mLs (26 Units total) into the skin every morning. 30 mL 2  . insulin regular (NOVOLIN R RELION) 100 units/mL injection Inject 0.05 mLs (5 Units total) daily with supper into the skin. And syringes 2/day 10 mL 11  . losartan (COZAAR) 100 MG tablet Take 1 tablet (100 mg total) by mouth daily. 90 tablet 3  . MONOJECT ULTRA COMFORT SYRINGE 29G X 1/2" 1 ML MISC USE TO INJECT INSULIN 4 TIMES PER DAY 360 each 3  . mupirocin ointment (BACTROBAN) 2 % Apply 1 application topically 2 (two) times daily. 30 g 2  . triamcinolone cream (KENALOG) 0.1 % Apply 1 application topically 2 (two) times daily. 30 g 0   No current facility-administered medications on file prior to visit.     Allergies  Allergen Reactions  . Penicillins Swelling    Swelling of the hands   . Terbinafine Hcl Hives  . Zestril [Lisinopril] Cough  . Ciprofloxacin Rash  . Terbinafine Hcl Hives    Family History  Problem Relation Age of Onset  . Heart disease Other        FH of CAD  . Colon polyps Paternal Uncle   . Kidney disease Paternal Uncle   . Diabetes Paternal Grandfather   . Diabetes Maternal Uncle   . Cancer Neg Hx   . Colon cancer Neg Hx   . Gallbladder disease Neg Hx   . Esophageal cancer Neg Hx     BP (!) 144/78 (BP Location: Right Arm, Patient Position: Sitting, Cuff Size: Large)   Pulse 71   Temp 98.6 F (37 C) (Oral)   Ht 5' 11.5" (1.816 m)   Wt 229 lb 6.4 oz (104.1 kg)   SpO2 97%   BMI 31.55 kg/m   REVIEW OF SYSTEMS: Denies LOC PHYSICAL EXAMINATION: VITAL SIGNS:  See vs page GENERAL: no distress Pulses: foot pulses are intact bilaterally.   MSK:  no deformity of the feet or ankles, except the right 3rd toe is absent. CV: no edema of the legs or ankles Skin:  no ulcer on the feet or ankles.  normal color and temp on the feet and ankles. Neuro: sensation is intact to touch on the feet and ankles, but decreased from normal. Ext: There is bilateral onychomycosis of the toenails.  LAB/XRAY RESULTS: Lab Results  Component Value Date   HGBA1C 10.2 (A) 10/15/2017   Lab Results  Component Value Date   WBC 4.2 10/15/2017   HGB 11.7 (L) 10/15/2017   HCT 34.5 (L) 10/15/2017   MCV 83.1  10/15/2017   PLT 169.0 10/15/2017   IMPRESSION: Type 1 DM, with PAD: worse Anemia: persistent, uncertain etiology PLAN:  a1c higher than reported cbg's.  We discussed.  Pt feels this a1c is an aberration, so we'll continue same insulin, and recheck a1c in a few mos. We'll follow anemia

## 2017-12-17 ENCOUNTER — Encounter: Payer: Self-pay | Admitting: Endocrinology

## 2017-12-17 ENCOUNTER — Ambulatory Visit
Admission: RE | Admit: 2017-12-17 | Discharge: 2017-12-17 | Disposition: A | Discharge status: Home / Self Care | Payer: Medicare HMO | Source: Ambulatory Visit | Attending: Endocrinology | Admitting: Endocrinology

## 2017-12-17 ENCOUNTER — Ambulatory Visit (INDEPENDENT_AMBULATORY_CARE_PROVIDER_SITE_OTHER): Payer: Medicare HMO | Admitting: Endocrinology

## 2017-12-17 VITALS — BP 120/64 | HR 65 | Ht 71.0 in | Wt 231.4 lb

## 2017-12-17 DIAGNOSIS — E11319 Type 2 diabetes mellitus with unspecified diabetic retinopathy without macular edema: Secondary | ICD-10-CM | POA: Diagnosis not present

## 2017-12-17 DIAGNOSIS — G8929 Other chronic pain: Secondary | ICD-10-CM

## 2017-12-17 DIAGNOSIS — Z23 Encounter for immunization: Secondary | ICD-10-CM | POA: Diagnosis not present

## 2017-12-17 DIAGNOSIS — M545 Low back pain, unspecified: Secondary | ICD-10-CM | POA: Insufficient documentation

## 2017-12-17 DIAGNOSIS — Z794 Long term (current) use of insulin: Secondary | ICD-10-CM | POA: Diagnosis not present

## 2017-12-17 LAB — POCT GLYCOSYLATED HEMOGLOBIN (HGB A1C): Hemoglobin A1C: 7.6 % — AB (ref 4.0–5.6)

## 2017-12-17 NOTE — Progress Notes (Signed)
Subjective:    Patient ID: Aaron Fox, male    DOB: 19-Dec-1955, 62 y.o.   MRN: [id number]  HPI Pt returns for f/u of diabetes mellitus: DM type: 1 Dx'ed: 7342 Complications: polyneuropathy, retinopathy, PAD, and renal insufficiency.  Therapy: insulin since 2003 DKA: never.  Severe hypoglycemia: never Pancreatitis: never.  Other: he takes 2 QD insulins, after poor results with multiple daily injections; he takes human insulin, due to cost.   Interval history: no cbg record, but states cbg's are well-controlled.  It is seldom low, and these episodes are mild.  This usually happens before lunch.  Pt says he never misses the insulin.  No recent steroids.   Past Medical History:  Diagnosis Date  . ALLERGIC RHINITIS 07/02/2008  . Atrial fibrillation ([place])   . CHF 06/19/2008  . COUGH DUE TO ACE INHIBITORS 10/05/2008  . DIABETES MELLITUS, TYPE II 09/22/2006  . ED (erectile dysfunction)   . Glaucoma   . GOUT 09/22/2006  . Hx of adenomatous colonic polyps 06/09/2015  . HYPERCHOLESTEROLEMIA 07/02/2008  . HYPERTENSION 05/31/2009  . HYPOGONADISM, MALE 01/15/2007  . Hypokalemia   . 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 Lymphadenopathy   . Peripheral neuropathy   . PUD (peptic ulcer disease)   . Rheumatoid arthritis(714.0) 09/22/2006    Past Surgical History:  Procedure Laterality Date  . CATARACT EXTRACTION, BILATERAL    . COLONOSCOPY  2004  . LYMPH NODE BIOPSY    . LYMPH NODE BIOPSY Right 10/16/2014   Procedure: RIGHT INGUINAL LYMPH NODE BIOPSY;  Surgeon: Aviva Signs, MD;  Location: [facility];  Service: General;  Laterality: Right;    Social History   Socioeconomic History  . Marital status: Married    Spouse name: Not on file  . Number of children: 3  . Years of education: Not on file  . Highest education level: Not on file  Occupational History  . 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 Financial resource strain: Not on file  . Food insecurity:     Worry: Not on file    Inability: Not on file  . Transportation needs:    Medical: Not on file    Non-medical: Not on file  Tobacco Use  . Smoking status: Former Smoker    Last attempt to quit: 11/02/1999    Years since quitting: 18.1  . Smokeless tobacco: Never Used  Substance and Sexual Activity  . Alcohol use: No    Alcohol/week: 0.0 standard drinks  . Drug use: No  . Sexual activity: Not on file  Lifestyle  . Physical activity:    Days per week: Not on file    Minutes per session: Not on file  . Stress: Not on file  Relationships  . Social connections:    Talks on phone: Not on file    Gets together: Not on file    Attends religious service: Not on file    Active member of club or organization: Not on file    Attends meetings of clubs or organizations: Not on file    Relationship status: Not on file  . Intimate partner violence:    Fear of current or ex partner: Not on file    Emotionally abused: Not on file    Physically abused: Not on file    Forced sexual activity: Not on file  Other Topics Concern  . Not on file  Social History Narrative   Regular exercise-yes  Current Outpatient Medications on File Prior to Visit  Medication Sig Dispense Refill  . ACCU-CHEK FASTCLIX LANCETS MISC TEST TWO TIMES DAILY 204 each 2  . ACCU-CHEK SMARTVIEW test strip USE TWICE DAILY 360 each 2  . Alcohol Swabs (B-D SINGLE USE SWABS REGULAR) PADS Use to check blood sugar two times per day. 200 each 2  . aspirin 81 MG tablet Take 81 mg by mouth daily.      Marland Kitchen atorvastatin (LIPITOR) 10 MG tablet TAKE 1 TABLET EVERY DAY 90 tablet 3  . BD INSULIN SYRINGE ULTRAFINE 31G X 5/16" 0.5 ML MISC Use with insulin. 100 each 5  . Blood Glucose Monitoring Suppl (ACCU-CHEK NANO SMARTVIEW) w/Device KIT Use to test blood sugar daily 1 kit 1  . gabapentin (NEURONTIN) 300 MG capsule Take 1 capsule (300 mg total) by mouth 2 (two) times daily. 180 capsule 2  . insulin NPH Human (HUMULIN N,NOVOLIN N) 100  UNIT/ML injection Inject 0.26 mLs (26 Units total) into the skin every morning. 30 mL 2  . insulin regular (NOVOLIN R RELION) 100 units/mL injection Inject 0.05 mLs (5 Units total) daily with supper into the skin. And syringes 2/day 10 mL 11  . losartan (COZAAR) 100 MG tablet Take 1 tablet (100 mg total) by mouth daily. 90 tablet 3  . MONOJECT ULTRA COMFORT SYRINGE 29G X 1/2" 1 ML MISC USE TO INJECT INSULIN 4 TIMES PER DAY 360 each 3  . mupirocin ointment (BACTROBAN) 2 % Apply 1 application topically 2 (two) times daily. 30 g 2  . triamcinolone cream (KENALOG) 0.1 % Apply 1 application topically 2 (two) times daily. 30 g 0   No current facility-administered medications on file prior to visit.     Allergies  Allergen Reactions  . Penicillins Swelling    Swelling of the hands   . Terbinafine Hcl Hives  . Zestril [Lisinopril] Cough  . Ciprofloxacin Rash  . Terbinafine Hcl Hives    Family History  Problem Relation Age of Onset  . Heart disease Other        FH of CAD  . Colon polyps Paternal Uncle   . Kidney disease Paternal Uncle   . Diabetes Paternal Grandfather   . Diabetes Maternal Uncle   . Cancer Neg Hx   . Colon cancer Neg Hx   . Gallbladder disease Neg Hx   . Esophageal cancer Neg Hx     BP 120/64 (BP Location: Left Arm, Patient Position: Sitting, Cuff Size: Normal)   Pulse 65   Ht _0  (1.803 m)   Wt 231 lb 6.4 oz (105 kg)   SpO2 94%   BMI 32.27 kg/m    Review of Systems Denies LOC.  Low back pain has recurred (no injury).  No bowel or bladder retention.  No hematuria.      Objective:   Physical Exam VITAL SIGNS:  See vs page GENERAL: no distress Pulses: foot pulses are intact bilaterally.   MSK: no deformity of the feet or ankles, except the right 3rd toe is absent.  CV: no edema of the legs or ankles.   Skin:  no ulcer on the feet or ankles.  normal color and temp on the feet and ankles. Neuro: sensation is intact to touch on the feet and ankles, but  decreased from normal. Ext: There is severe bilateral onychomycosis of the toenails.  Lab Results  Component Value Date   CREATININE 1.19 10/15/2017   BUN 19 10/15/2017   NA 140 10/15/2017  K 4.2 10/15/2017   CL 106 10/15/2017   CO2 26 10/15/2017     Lab Results  Component Value Date   HGBA1C 7.6 (A) 12/17/2017       Assessment & Plan:  Type 1 DM, with renal insuff: this is the best control this pt should aim for, given this regimen, which does match insulin to her changing needs throughout the day Low-back pain, chronic: worse  Patient Instructions  Please continue the same insulins. X-rays are requested for you today.  We'll let you know about the results. check your blood sugar twice a day.  vary the time of day when you check, between before the 3 meals, and at bedtime.  also check if you have symptoms of your blood sugar being too high or too low.  please keep a record of the readings and bring it to your next appointment here (or you can bring the meter itself).  You can write it on any piece of paper.  please call us sooner if your blood sugar goes below 70, or if you have a lot of readings over 200. Please come back for a follow-up appointment in 4 months.

## 2017-12-17 NOTE — Patient Instructions (Addendum)
Please continue the same insulins. X-rays are requested for you today.  We'll let you know about the results. check your blood sugar twice a day.  vary the time of day when you check, between before the 3 meals, and at bedtime.  also check if you have symptoms of your blood sugar being too high or too low.  please keep a record of the readings and bring it to your next appointment here (or you can bring the meter itself).  You can write it on any piece of paper.  please call us sooner if your blood sugar goes below 70, or if you have a lot of readings over 200. Please come back for a follow-up appointment in 4 months.

## 2017-12-18 ENCOUNTER — Telehealth: Payer: Self-pay

## 2017-12-18 NOTE — Telephone Encounter (Signed)
-----   Message from Renato Shin, MD sent at 12/17/2017  9:31 AM EDT ----- please call patient: Arthritis is seen, which is common.  Also, you have hardening of the arteries.  The treatment of this is to keep the blood pressure, sugar, and cholesterol in a good range, as you are doing.  Finally, you may have osteoporosis.  Do you want to check a bone-density test? Please let me know.

## 2017-12-18 NOTE — Telephone Encounter (Signed)
Called to inform of imaging results. LVM requesting returned call.

## 2017-12-19 ENCOUNTER — Telehealth: Payer: Self-pay | Admitting: Endocrinology

## 2017-12-19 NOTE — Telephone Encounter (Signed)
Patient returning Ammie's call. Please call patient at ph# [phone]

## 2017-12-19 NOTE — Telephone Encounter (Signed)
Returned pt call. Informed of imaging results. Verbalized acceptance and understanding. Does not wish to proceed with scheduling a DEXA at this time.

## 2017-12-19 NOTE — Telephone Encounter (Signed)
Returned pt call as requested. Thanks

## 2017-12-20 ENCOUNTER — Telehealth: Payer: Self-pay

## 2017-12-20 ENCOUNTER — Telehealth: Payer: Self-pay | Admitting: Endocrinology

## 2017-12-20 DIAGNOSIS — M069 Rheumatoid arthritis, unspecified: Secondary | ICD-10-CM

## 2017-12-20 DIAGNOSIS — Z13820 Encounter for screening for osteoporosis: Secondary | ICD-10-CM

## 2017-12-20 NOTE — Telephone Encounter (Signed)
Pt requested I return his call. States he would like to proceed with scheduling a DEXA as recommended by Dr. Loanne Drilling. Appt scheduled at [facility], Nov 1 @ [place] and appt info mailed to pt home address.

## 2017-12-20 NOTE — Telephone Encounter (Signed)
Letter pertaining to Bone Density mailed today.

## 2017-12-20 NOTE — Telephone Encounter (Signed)
Patient would also like a call back, please.

## 2017-12-20 NOTE — Telephone Encounter (Signed)
Returned call. Requested to be scheduled for Bone Density. Scheduled for Bone Density 12/28/17 @ [place]. Letter mailed along with map.

## 2017-12-20 NOTE — Telephone Encounter (Signed)
Patient stated they would like Aaron Fox to know to send the referral to the physician Dr. Loanne Drilling suggested. Please Advise.

## 2017-12-28 ENCOUNTER — Inpatient Hospital Stay: Admission: RE | Admit: 2017-12-28 | Payer: Medicare HMO | Source: Ambulatory Visit

## 2017-12-31 ENCOUNTER — Ambulatory Visit (INDEPENDENT_AMBULATORY_CARE_PROVIDER_SITE_OTHER)
Admission: RE | Admit: 2017-12-31 | Discharge: 2017-12-31 | Disposition: A | Discharge status: Home / Self Care | Payer: Medicare HMO | Source: Ambulatory Visit | Attending: Endocrinology | Admitting: Endocrinology

## 2017-12-31 DIAGNOSIS — Z13820 Encounter for screening for osteoporosis: Secondary | ICD-10-CM | POA: Diagnosis not present

## 2017-12-31 DIAGNOSIS — M069 Rheumatoid arthritis, unspecified: Secondary | ICD-10-CM | POA: Diagnosis not present

## 2018-04-05 ENCOUNTER — Telehealth: Payer: Self-pay | Admitting: Endocrinology

## 2018-04-05 NOTE — Telephone Encounter (Signed)
Patient called back and advised that Rx needs to to go to [facility]. [phone] fax [phone]

## 2018-04-05 NOTE — Telephone Encounter (Signed)
MEDICATION: atorvastatin (LIPITOR) 10 MG tablet, AND losartan (COZAAR) 100 MG tablet ANDgabapentin (NEURONTIN) 300 MG capsule  PHARMACY:  [facility], [place] - [address]  IS THIS A 90 DAY SUPPLY : Yes  IS PATIENT OUT OF MEDICATION:No   IF NOT; HOW MUCH IS LEFT: approx. 1 week  LAST APPOINTMENT DATE: @10 /24/2019  NEXT APPOINTMENT DATE:@2 /24/2020  DO WE HAVE YOUR PERMISSION TO LEAVE A DETAILED MESSAGE: Yes  OTHER COMMENTS:    **Let patient know to contact pharmacy at the end of the day to make sure medication is ready. **  ** Please notify patient to allow 48-72 hours to process**  **Encourage patient to contact the pharmacy for refills or they can request refills through [facility]**

## 2018-04-08 ENCOUNTER — Other Ambulatory Visit: Payer: Self-pay

## 2018-04-08 MED ORDER — ATORVASTATIN CALCIUM 10 MG PO TABS: 10.0000 mg | ORAL_TABLET | Freq: Every day | ORAL | 1 refills | Status: DC

## 2018-04-08 NOTE — Telephone Encounter (Signed)
Rx was sent to his pharmacy.

## 2018-04-22 ENCOUNTER — Ambulatory Visit: Payer: Medicare HMO | Admitting: Endocrinology

## 2018-04-24 ENCOUNTER — Telehealth: Payer: Self-pay | Admitting: Endocrinology

## 2018-04-24 ENCOUNTER — Other Ambulatory Visit: Payer: Self-pay

## 2018-04-24 MED ORDER — BD SWAB SINGLE USE REGULAR PADS: MEDICATED_PAD | 2 refills | Status: DC

## 2018-04-24 MED ORDER — ACCU-CHEK FASTCLIX LANCETS MISC: 11 refills | Status: DC

## 2018-04-24 MED ORDER — GLUCOSE BLOOD VI STRP: ORAL_STRIP | 2 refills | Status: DC

## 2018-04-24 NOTE — Telephone Encounter (Signed)
Patient stated he is still needing all his DM supplies   losartan (COZAAR) 100 MG tablet    ANDgabapentin (NEURONTIN) 300 MG capsule   Sent into the pharmacy  [facility] ([place], [place]

## 2018-04-24 NOTE — Telephone Encounter (Signed)
Called pt and informed Rx's were sent to refill diabetic supplies. However, Rx's for losartan and gabapentin would need to be refilled by PCP. Verbalized acceptance and understanding.

## 2018-04-26 ENCOUNTER — Telehealth: Payer: Self-pay | Admitting: Endocrinology

## 2018-04-26 ENCOUNTER — Other Ambulatory Visit: Payer: Self-pay

## 2018-04-26 DIAGNOSIS — Z794 Long term (current) use of insulin: Principal | ICD-10-CM

## 2018-04-26 DIAGNOSIS — E11319 Type 2 diabetes mellitus with unspecified diabetic retinopathy without macular edema: Secondary | ICD-10-CM

## 2018-04-26 MED ORDER — GLUCOSE BLOOD VI STRP: ORAL_STRIP | 12 refills | Status: DC

## 2018-04-26 MED ORDER — ONETOUCH DELICA LANCETS 30G MISC: 1.0000 | Freq: Two times a day (BID) | 2 refills | Status: DC

## 2018-04-26 MED ORDER — ONETOUCH VERIO W/DEVICE KIT: 1.0000 | PACK | Freq: Two times a day (BID) | 0 refills | Status: DC

## 2018-04-26 NOTE — Telephone Encounter (Signed)
Per [facility], "Caller Ysidro Evert from [facility], [place] # [phone], stated he has questions regarding prescriptions for Aaron Fox, DOB 03-24-1955 written by Dr. Renato Shin. Accucheck lancets and Smartview test strips not covered by insurance. Alternatives are One Touch or Free Style. Can send new prescriptions to FAX # [phone], or call back to clarify."

## 2018-04-26 NOTE — Telephone Encounter (Signed)
E-Prescribing Status: Sent to pharmacy (04/26/2018 8:12 AM EST)  All Rx's sent as requested

## 2018-04-29 ENCOUNTER — Other Ambulatory Visit: Payer: Self-pay

## 2018-04-29 ENCOUNTER — Telehealth: Payer: Self-pay | Admitting: Endocrinology

## 2018-04-29 DIAGNOSIS — E11319 Type 2 diabetes mellitus with unspecified diabetic retinopathy without macular edema: Secondary | ICD-10-CM

## 2018-04-29 DIAGNOSIS — Z794 Long term (current) use of insulin: Principal | ICD-10-CM

## 2018-04-29 MED ORDER — ONETOUCH DELICA LANCETS 30G MISC: 1.0000 | Freq: Two times a day (BID) | 2 refills | Status: DC

## 2018-04-29 MED ORDER — GLUCOSE BLOOD VI STRP: ORAL_STRIP | 12 refills | Status: DC

## 2018-04-29 NOTE — Telephone Encounter (Signed)
Ysidro Evert from [facility] ph# [phone] requests to be called re: the following  medications. The above is requesting Test Strips and Lancets in quantities of 200 for 90 day supplies. If no answer please leave detailed message on Jeremy's voice mail (direct line listed above)

## 2018-04-29 NOTE — Telephone Encounter (Signed)
E-Prescribing Status: Sent to pharmacy (04/29/2018 10:31 AM EST)  Rx's re-sent today as indicated above

## 2018-05-06 ENCOUNTER — Encounter: Payer: Self-pay | Admitting: Endocrinology

## 2018-05-06 ENCOUNTER — Ambulatory Visit (INDEPENDENT_AMBULATORY_CARE_PROVIDER_SITE_OTHER): Payer: PPO | Admitting: Endocrinology

## 2018-05-06 VITALS — BP 144/70 | HR 80 | Ht 71.0 in | Wt 236.2 lb

## 2018-05-06 DIAGNOSIS — Z794 Long term (current) use of insulin: Secondary | ICD-10-CM

## 2018-05-06 DIAGNOSIS — E11319 Type 2 diabetes mellitus with unspecified diabetic retinopathy without macular edema: Secondary | ICD-10-CM | POA: Diagnosis not present

## 2018-05-06 LAB — POCT GLYCOSYLATED HEMOGLOBIN (HGB A1C): Hemoglobin A1C: 10.1 % — AB (ref 4.0–5.6)

## 2018-05-06 NOTE — Patient Instructions (Addendum)
Your blood pressure is high today.  Please see a new primary care provider soon, to have it rechecked Please continue the same insulins for now. check your blood sugar twice a day.  vary the time of day when you check, between before the 3 meals, and at bedtime.  also check if you have symptoms of your blood sugar being too high or too low.  please keep a record of the readings and bring it to your next appointment here (or you can bring the meter itself).  You can write it on any piece of paper.  please call us sooner if your blood sugar goes below 70, or if you have a lot of readings over 200. Please come back for a follow-up appointment in 2 months.

## 2018-05-06 NOTE — Progress Notes (Signed)
Subjective:    Patient ID: Aaron Fox, male    DOB: 11/27/1955, 63 y.o.   MRN: [id number]  HPI Pt returns for f/u of diabetes mellitus: DM type: 1 Dx'ed: 8127 Complications: polyneuropathy, retinopathy, PAD, and renal insufficiency.  Therapy: insulin since 2003 DKA: never.  Severe hypoglycemia: never Pancreatitis: never.  Other: he takes 2 QD insulins, after poor results with multiple daily injections; he takes human insulin, due to cost; he is disabled.   Interval history: no cbg record, but states cbg's are well-controlled.  He has mild hypoglycemia approx once per month, and these episodes are mild.  This usually happens before lunch.  Pt says today he never misses the insulin.  No recent steroids.   Past Medical History:  Diagnosis Date  . ALLERGIC RHINITIS 07/02/2008  . Atrial fibrillation ([place])   . CHF 06/19/2008  . COUGH DUE TO ACE INHIBITORS 10/05/2008  . DIABETES MELLITUS, TYPE II 09/22/2006  . ED (erectile dysfunction)   . Glaucoma   . GOUT 09/22/2006  . Hx of adenomatous colonic polyps 06/09/2015  . HYPERCHOLESTEROLEMIA 07/02/2008  . HYPERTENSION 05/31/2009  . HYPOGONADISM, MALE 01/15/2007  . Hypokalemia   . 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 Lymphadenopathy   . Peripheral neuropathy   . PUD (peptic ulcer disease)   . Rheumatoid arthritis(714.0) 09/22/2006    Past Surgical History:  Procedure Laterality Date  . CATARACT EXTRACTION, BILATERAL    . COLONOSCOPY  2004  . LYMPH NODE BIOPSY    . LYMPH NODE BIOPSY Right 10/16/2014   Procedure: RIGHT INGUINAL LYMPH NODE BIOPSY;  Surgeon: Aviva Signs, MD;  Location: [facility];  Service: General;  Laterality: Right;    Social History   Socioeconomic History  . Marital status: Married    Spouse name: Not on file  . Number of children: 3  . Years of education: Not on file  . Highest education level: Not on file  Occupational History  . Occupation: TEFL teacher: [employer]  Social Needs  .  Financial resource strain: Not on file  . Food insecurity:    Worry: Not on file    Inability: Not on file  . Transportation needs:    Medical: Not on file    Non-medical: Not on file  Tobacco Use  . Smoking status: Former Smoker    Last attempt to quit: 11/02/1999    Years since quitting: 18.5  . Smokeless tobacco: Never Used  Substance and Sexual Activity  . Alcohol use: No    Alcohol/week: 0.0 standard drinks  . Drug use: No  . Sexual activity: Not on file  Lifestyle  . Physical activity:    Days per week: Not on file    Minutes per session: Not on file  . Stress: Not on file  Relationships  . Social connections:    Talks on phone: Not on file    Gets together: Not on file    Attends religious service: Not on file    Active member of club or organization: Not on file    Attends meetings of clubs or organizations: Not on file    Relationship status: Not on file  . Intimate partner violence:    Fear of current or ex partner: Not on file    Emotionally abused: Not on file    Physically abused: Not on file    Forced sexual activity: Not on file  Other Topics Concern  . Not on file  Social History Narrative   Regular exercise-yes    Current Outpatient Medications on File Prior to Visit  Medication Sig Dispense Refill  . Alcohol Swabs (B-D SINGLE USE SWABS REGULAR) PADS Use to monitor glucose levels two times per day; E10.49 200 each 2  . aspirin 81 MG tablet Take 81 mg by mouth daily.      Marland Kitchen atorvastatin (LIPITOR) 10 MG tablet Take 1 tablet (10 mg total) by mouth daily. 90 tablet 1  . BD INSULIN SYRINGE ULTRAFINE 31G X 5/16" 0.5 ML MISC Use with insulin. 100 each 5  . Blood Glucose Monitoring Suppl (ONETOUCH VERIO) w/Device KIT 1 each by Does not apply route 2 (two) times daily. Use to monitor glucose levels two times per day; E10.49 1 kit 0  . gabapentin (NEURONTIN) 300 MG capsule Take 1 capsule (300 mg total) by mouth 2 (two) times daily. 180 capsule 2  . glucose blood  (ONETOUCH VERIO) test strip Use to monitor glucose levels two times per day; E10.49 200 each 12  . insulin NPH Human (HUMULIN N,NOVOLIN N) 100 UNIT/ML injection Inject 0.26 mLs (26 Units total) into the skin every morning. 30 mL 2  . insulin regular (NOVOLIN R RELION) 100 units/mL injection Inject 0.05 mLs (5 Units total) daily with supper into the skin. And syringes 2/day 10 mL 11  . losartan (COZAAR) 100 MG tablet Take 1 tablet (100 mg total) by mouth daily. 90 tablet 3  . MONOJECT ULTRA COMFORT SYRINGE 29G X 1/2" 1 ML MISC USE TO INJECT INSULIN 4 TIMES PER DAY 360 each 3  . mupirocin ointment (BACTROBAN) 2 % Apply 1 application topically 2 (two) times daily. 30 g 2  . ONETOUCH DELICA LANCETS 76H MISC 1 each by Does not apply route 2 (two) times daily. Use to monitor glucose levels two times per day; E10.49 200 each 2  . triamcinolone cream (KENALOG) 0.1 % Apply 1 application topically 2 (two) times daily. 30 g 0   No current facility-administered medications on file prior to visit.     Allergies  Allergen Reactions  . Penicillins Swelling    Swelling of the hands   . Terbinafine Hcl Hives  . Zestril [Lisinopril] Cough  . Ciprofloxacin Rash  . Terbinafine Hcl Hives    Family History  Problem Relation Age of Onset  . Heart disease Other        FH of CAD  . Colon polyps Paternal Uncle   . Kidney disease Paternal Uncle   . Diabetes Paternal Grandfather   . Diabetes Maternal Uncle   . Cancer Neg Hx   . Colon cancer Neg Hx   . Gallbladder disease Neg Hx   . Esophageal cancer Neg Hx     BP (!) 144/70 (BP Location: Left Arm, Patient Position: Sitting, Cuff Size: Normal)   Pulse 80   Ht 5' 11"  (1.803 m)   Wt 236 lb 3.2 oz (107.1 kg)   SpO2 96%   BMI 32.94 kg/m    Review of Systems Denies LOC.      Objective:   Physical Exam VITAL SIGNS:  See vs page GENERAL: no distress Pulses: foot pulses are intact bilaterally.   MSK: no deformity of the feet or ankles, except the  right 3rd toe is absent.  CV: no edema of the legs or ankles.   Skin:  no ulcer on the feet or ankles.  normal color and temp on the feet and ankles. Neuro: sensation is intact to touch  on the feet and ankles, but decreased from normal. Ext: There is severe bilateral onychomycosis of the toenails.   Lab Results  Component Value Date   CREATININE 1.19 10/15/2017   BUN 19 10/15/2017   NA 140 10/15/2017   K 4.2 10/15/2017   CL 106 10/15/2017   CO2 26 10/15/2017        Assessment & Plan:  HTH: is noted today Type 1 DM, with renal insuff: worse: we discussed.  He declines to increase the insulin Hypoglycemia: This limits aggressiveness of glycemic control   Patient Instructions  Your blood pressure is high today.  Please see a new primary care provider soon, to have it rechecked Please continue the same insulins for now. check your blood sugar twice a day.  vary the time of day when you check, between before the 3 meals, and at bedtime.  also check if you have symptoms of your blood sugar being too high or too low.  please keep a record of the readings and bring it to your next appointment here (or you can bring the meter itself).  You can write it on any piece of paper.  please call us sooner if your blood sugar goes below 70, or if you have a lot of readings over 200. Please come back for a follow-up appointment in 2 months.

## 2018-05-24 DIAGNOSIS — I10 Essential (primary) hypertension: Secondary | ICD-10-CM | POA: Diagnosis not present

## 2018-05-24 DIAGNOSIS — E1142 Type 2 diabetes mellitus with diabetic polyneuropathy: Secondary | ICD-10-CM | POA: Diagnosis not present

## 2018-05-24 DIAGNOSIS — Z6832 Body mass index (BMI) 32.0-32.9, adult: Secondary | ICD-10-CM | POA: Diagnosis not present

## 2018-06-03 ENCOUNTER — Other Ambulatory Visit: Payer: Self-pay

## 2018-06-03 DIAGNOSIS — E11319 Type 2 diabetes mellitus with unspecified diabetic retinopathy without macular edema: Secondary | ICD-10-CM

## 2018-06-03 DIAGNOSIS — Z794 Long term (current) use of insulin: Principal | ICD-10-CM

## 2018-06-03 MED ORDER — ACCU-CHEK AVIVA PLUS W/DEVICE KIT: 1.0000 | PACK | Freq: Two times a day (BID) | 0 refills | Status: DC

## 2018-06-03 MED ORDER — ACCU-CHEK AVIVA VI SOLN: 1.0000 | 1 refills | Status: DC | PRN

## 2018-06-03 MED ORDER — ACCU-CHEK SOFTCLIX LANCETS MISC: 12 refills | Status: DC

## 2018-06-03 MED ORDER — BD SWAB SINGLE USE REGULAR PADS: MEDICATED_PAD | 2 refills | Status: DC

## 2018-06-03 MED ORDER — GLUCOSE BLOOD VI STRP: ORAL_STRIP | 12 refills | Status: DC

## 2018-06-11 DIAGNOSIS — E1142 Type 2 diabetes mellitus with diabetic polyneuropathy: Secondary | ICD-10-CM | POA: Diagnosis not present

## 2018-06-11 DIAGNOSIS — D649 Anemia, unspecified: Secondary | ICD-10-CM | POA: Diagnosis not present

## 2018-06-11 DIAGNOSIS — I10 Essential (primary) hypertension: Secondary | ICD-10-CM | POA: Diagnosis not present

## 2018-07-24 ENCOUNTER — Other Ambulatory Visit: Payer: Self-pay | Admitting: Endocrinology

## 2018-08-05 ENCOUNTER — Other Ambulatory Visit: Payer: Self-pay

## 2018-08-05 ENCOUNTER — Ambulatory Visit (INDEPENDENT_AMBULATORY_CARE_PROVIDER_SITE_OTHER): Payer: Medicare HMO | Admitting: Endocrinology

## 2018-08-05 DIAGNOSIS — Z794 Long term (current) use of insulin: Secondary | ICD-10-CM | POA: Diagnosis not present

## 2018-08-05 DIAGNOSIS — E1051 Type 1 diabetes mellitus with diabetic peripheral angiopathy without gangrene: Secondary | ICD-10-CM | POA: Diagnosis not present

## 2018-08-05 DIAGNOSIS — E10319 Type 1 diabetes mellitus with unspecified diabetic retinopathy without macular edema: Secondary | ICD-10-CM | POA: Diagnosis not present

## 2018-08-05 DIAGNOSIS — E11319 Type 2 diabetes mellitus with unspecified diabetic retinopathy without macular edema: Secondary | ICD-10-CM

## 2018-08-05 DIAGNOSIS — E1022 Type 1 diabetes mellitus with diabetic chronic kidney disease: Secondary | ICD-10-CM

## 2018-08-05 NOTE — Progress Notes (Signed)
Subjective:    Patient ID: Aaron Fox, male    DOB: [birth year], 63 y.o.   MRN: [id number]  HPI  telehealth visit today via phone x 6 minutes Alternatives to telehealth are presented to this patient, and the patient agrees to the telehealth visit. Pt is advised of the cost of the visit, and agrees to this, also.   Patient is at home, and I am at the office.   Persons attending the telehealth visit: the patient and I Pt returns for f/u of diabetes mellitus: DM type: 1 Dx'ed: 6606 Complications: polyneuropathy, retinopathy, PAD, and renal insufficiency.   Therapy: insulin since 2003.  DKA: never.  Severe hypoglycemia: never.   Pancreatitis: never.  Other: he takes 2 QD insulins, after poor results with multiple daily injections; he takes human insulin, due to cost; he is disabled.   Interval history: no cbg record, but states cbg's vary from 63-223.  It is in general highest in the afternoon.  He seldom has hypoglycemia, and these episodes are mild.  This usually happens in the middle of the night.  Pt says today he never misses the insulin.  No recent steroids.   Past Medical History:  Diagnosis Date  . ALLERGIC RHINITIS 07/02/2008  . Atrial fibrillation ([place])   . CHF 06/19/2008  . COUGH DUE TO ACE INHIBITORS 10/05/2008  . DIABETES MELLITUS, TYPE II 09/22/2006  . ED (erectile dysfunction)   . Glaucoma   . GOUT 09/22/2006  . Hx of adenomatous colonic polyps 06/09/2015  . HYPERCHOLESTEROLEMIA 07/02/2008  . HYPERTENSION 05/31/2009  . HYPOGONADISM, MALE 01/15/2007  . Hypokalemia   . 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 Lymphadenopathy   . Peripheral neuropathy   . PUD (peptic ulcer disease)   . Rheumatoid arthritis(714.0) 09/22/2006    Past Surgical History:  Procedure Laterality Date  . CATARACT EXTRACTION, BILATERAL    . COLONOSCOPY  2004  . LYMPH NODE BIOPSY    . LYMPH NODE BIOPSY Right 10/16/2014   Procedure: RIGHT INGUINAL LYMPH NODE BIOPSY;  Surgeon: Aviva Signs, MD;   Location: [facility];  Service: General;  Laterality: Right;    Social History   Socioeconomic History  . Marital status: Married    Spouse name: Not on file  . Number of children: 3  . Years of education: Not on file  . Highest education level: Not on file  Occupational History  . 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 Financial resource strain: Not on file  . Food insecurity:    Worry: Not on file    Inability: Not on file  . Transportation needs:    Medical: Not on file    Non-medical: Not on file  Tobacco Use  . Smoking status: Former Smoker    Last attempt to quit: 11/02/1999    Years since quitting: 18.7  . Smokeless tobacco: Never Used  Substance and Sexual Activity  . Alcohol use: No    Alcohol/week: 0.0 standard drinks  . Drug use: No  . Sexual activity: Not on file  Lifestyle  . Physical activity:    Days per week: Not on file    Minutes per session: Not on file  . Stress: Not on file  Relationships  . Social connections:    Talks on phone: Not on file    Gets together: Not on file    Attends religious service: Not on file    Active member of club or  organization: Not on file    Attends meetings of clubs or organizations: Not on file    Relationship status: Not on file  . Intimate partner violence:    Fear of current or ex partner: Not on file    Emotionally abused: Not on file    Physically abused: Not on file    Forced sexual activity: Not on file  Other Topics Concern  . Not on file  Social History Narrative   Regular exercise-yes    Current Outpatient Medications on File Prior to Visit  Medication Sig Dispense Refill  . Accu-Chek Softclix Lancets lancets Use to monitor glucose levels two times per day; E10.49 100 each 12  . Alcohol Swabs (B-D SINGLE USE SWABS REGULAR) PADS Use to cleanse site prior to use of lancet to obtain droplet of blood two times per day; E10.49 200 each 2  . aspirin 81 MG tablet Take 81 mg by mouth  daily.      Marland Kitchen atorvastatin (LIPITOR) 10 MG tablet Take 1 tablet (10 mg total) by mouth daily. 90 tablet 1  . BD INSULIN SYRINGE ULTRAFINE 31G X 5/16" 0.5 ML MISC Use with insulin. 100 each 5  . Blood Glucose Calibration (ACCU-CHEK AVIVA) SOLN 1 each by In Vitro route as needed. Use solution prn to evaluate accuracy of meter 1 each 1  . Blood Glucose Monitoring Suppl (ACCU-CHEK AVIVA PLUS) w/Device KIT 1 each by Does not apply route 2 (two) times daily. Use to monitor glucose levels two times per day; E10.49 1 kit 0  . gabapentin (NEURONTIN) 300 MG capsule Take 1 capsule (300 mg total) by mouth 2 (two) times daily. 180 capsule 2  . glucose blood (ACCU-CHEK AVIVA PLUS) test strip Use to monitor glucose levels two times per day; E10.49 100 each 12  . insulin NPH Human (HUMULIN N,NOVOLIN N) 100 UNIT/ML injection Inject 0.26 mLs (26 Units total) into the skin every morning. 30 mL 2  . insulin regular (NOVOLIN R RELION) 100 units/mL injection Inject 0.05 mLs (5 Units total) daily with supper into the skin. And syringes 2/day 10 mL 11  . losartan (COZAAR) 100 MG tablet Take 1 tablet (100 mg total) by mouth daily. 90 tablet 3  . MONOJECT ULTRA COMFORT SYRINGE 29G X 1/2" 1 ML MISC USE TO INJECT INSULIN 4 TIMES PER DAY 360 each 3  . mupirocin ointment (BACTROBAN) 2 % Apply 1 application topically 2 (two) times daily. 30 g 2  . triamcinolone cream (KENALOG) 0.1 % Apply 1 application topically 2 (two) times daily. 30 g 0   No current facility-administered medications on file prior to visit.     Allergies  Allergen Reactions  . Penicillins Swelling    Swelling of the hands   . Terbinafine Hcl Hives  . Zestril [Lisinopril] Cough  . Ciprofloxacin Rash  . Terbinafine Hcl Hives    Family History  Problem Relation Age of Onset  . Heart disease Other        FH of CAD  . Colon polyps Paternal Uncle   . Kidney disease Paternal Uncle   . Diabetes Paternal Grandfather   . Diabetes Maternal Uncle   .  Cancer Neg Hx   . Colon cancer Neg Hx   . Gallbladder disease Neg Hx   . Esophageal cancer Neg Hx     There were no vitals taken for this visit.   Review of Systems Denies LOC    Objective:   Physical Exam  Lab Results  Component Value Date   CREATININE 1.19 10/15/2017   BUN 19 10/15/2017   NA 140 10/15/2017   K 4.2 10/15/2017   CL 106 10/15/2017   CO2 26 10/15/2017       Assessment & Plan:  Type 1 DM, with PAD.  Uncertain glycemic control.  Please come in to have the a1c checked.

## 2018-08-09 ENCOUNTER — Other Ambulatory Visit: Payer: Medicare HMO

## 2018-08-09 ENCOUNTER — Ambulatory Visit (INDEPENDENT_AMBULATORY_CARE_PROVIDER_SITE_OTHER): Payer: Medicare HMO

## 2018-08-09 ENCOUNTER — Telehealth: Payer: Self-pay | Admitting: *Deleted

## 2018-08-09 ENCOUNTER — Other Ambulatory Visit: Payer: Self-pay

## 2018-08-09 ENCOUNTER — Ambulatory Visit (INDEPENDENT_AMBULATORY_CARE_PROVIDER_SITE_OTHER): Payer: Medicare HMO | Admitting: Podiatry

## 2018-08-09 ENCOUNTER — Encounter: Payer: Self-pay | Admitting: Podiatry

## 2018-08-09 VITALS — Temp 97.3°F

## 2018-08-09 DIAGNOSIS — M869 Osteomyelitis, unspecified: Secondary | ICD-10-CM

## 2018-08-09 DIAGNOSIS — Z20822 Contact with and (suspected) exposure to covid-19: Secondary | ICD-10-CM

## 2018-08-09 DIAGNOSIS — R6889 Other general symptoms and signs: Secondary | ICD-10-CM | POA: Diagnosis not present

## 2018-08-09 DIAGNOSIS — L97511 Non-pressure chronic ulcer of other part of right foot limited to breakdown of skin: Secondary | ICD-10-CM

## 2018-08-09 DIAGNOSIS — I739 Peripheral vascular disease, unspecified: Secondary | ICD-10-CM | POA: Diagnosis not present

## 2018-08-09 MED ORDER — DOXYCYCLINE HYCLATE 100 MG PO TABS: 100.0000 mg | ORAL_TABLET | Freq: Two times a day (BID) | ORAL | 0 refills | Status: DC

## 2018-08-09 NOTE — Telephone Encounter (Signed)
CALLED PT. AT SCHEDULED APPOINTMENT X2 MESSAQGE LEFT @1100 

## 2018-08-09 NOTE — Patient Instructions (Signed)
Pre-Operative Instructions  Congratulations, you have decided to take an important step towards improving your quality of life.  You can be assured that the doctors and staff at [facility] will be with you every step of the way.  Here are some important things you should know:  1. Plan to be at the surgery center/hospital at least 1 (one) hour prior to your scheduled time, unless otherwise directed by the surgical center/hospital staff.  You must have a responsible adult accompany you, remain during the surgery and drive you home.  Make sure you have directions to the surgical center/hospital to ensure you arrive on time. 2. If you are having surgery at [facility] or [facility] hospitals, you will need a copy of your medical history and physical form from your family physician within one month prior to the date of surgery. We will give you a form for your primary physician to complete.  3. We make every effort to accommodate the date you request for surgery.  However, there are times where surgery dates or times have to be moved.  We will contact you as soon as possible if a change in schedule is required.   4. No aspirin/ibuprofen for one week before surgery.  If you are on aspirin, any non-steroidal anti-inflammatory medications (Mobic, Aleve, Ibuprofen) should not be taken seven (7) days prior to your surgery.  You make take Tylenol for pain prior to surgery.  5. Medications - If you are taking daily heart and blood pressure medications, seizure, reflux, allergy, asthma, anxiety, pain or diabetes medications, make sure you notify the surgery center/hospital before the day of surgery so they can tell you which medications you should take or avoid the day of surgery. 6. No food or drink after midnight the night before surgery unless directed otherwise by surgical center/hospital staff. 7. No alcoholic beverages 24-hours prior to surgery.  No smoking 24-hours prior or 24-hours after  surgery. 8. Wear loose pants or shorts. They should be loose enough to fit over bandages, boots, and casts. 9. Don't wear slip-on shoes. Sneakers are preferred. 10. Bring your boot with you to the surgery center/hospital.  Also bring crutches or a walker if your physician has prescribed it for you.  If you do not have this equipment, it will be provided for you after surgery. 11. If you have not been contacted by the surgery center/hospital by the day before your surgery, call to confirm the date and time of your surgery. 12. Leave-time from work may vary depending on the type of surgery you have.  Appropriate arrangements should be made prior to surgery with your employer. 13. Prescriptions will be provided immediately following surgery by your doctor.  Fill these as soon as possible after surgery and take the medication as directed. Pain medications will not be refilled on weekends and must be approved by the doctor. 14. Remove nail polish on the operative foot and avoid getting pedicures prior to surgery. 15. Wash the night before surgery.  The night before surgery wash the foot and leg well with water and the antibacterial soap provided. Be sure to pay special attention to beneath the toenails and in between the toes.  Wash for at least three (3) minutes. Rinse thoroughly with water and dry well with a towel.  Perform this wash unless told not to do so by your physician.  Enclosed: 1 Ice pack (please put in freezer the night before surgery)   1 Hibiclens skin cleaner     Pre-op instructions  If you have any questions regarding the instructions, please do not hesitate to call our office.  [place]: [address], [place], [place] [address] -- [phone]  [place]: [address] [place], [place] [address] -- [phone]  [place]: [address]  [place], [place] [address] -- [phone]  [place]: [address], [address], [place], [place] [address] -- [phone]  Website:  [email or web address] 

## 2018-08-09 NOTE — Telephone Encounter (Signed)
Pt. Called back and cancelled procedure and according to appt.notes will call back and reschedule.

## 2018-08-09 NOTE — Progress Notes (Signed)
Subjective: 63 year old male presents the office today as an acute appointment for concerns of a wound on his right big toe.  He states about 4 days ago the nail came off and he noticed a lot of bleeding regardless shower and he is noticing odor coming from the toe.  This is not painful he has noticed a black spot on the toe as well the skin is peeling.  He is diabetic and his blood sugar was when it was 182.  Denies any red streaks or other open wounds. Denies any systemic complaints such as fevers, chills, nausea, vomiting. No acute changes since last appointment, and no other complaints at this time.   Objective: AAO x3, NAD DP/PT pulses palpable bilaterally, CRT less than 3 seconds Protective sensation decreased with Simms Weinstein monofilament To the right hallux there is significant epidermolysis present to the distal aspect.  No toenails present.  Is able to debride some of the loose skin and upon debridement the distal phalanx was exposed.  There is also a wound on the dorsal medial aspect which also probes to the distal phalanx, convex to the wound at the distal aspect of the digit.  There is an odor coming from the toe but this appears to be localized to the distal aspect of the hallux.  There is no ascending cellulitis.  There is no fluctuation crepitation. No edema, erythema, increase in warmth to bilateral lower extremities.  No open lesions or pre-ulcerative lesions.  No pain with calf compression, swelling, warmth, erythema  Assessment: Right hallux ulceration, osteomyelitis  Plan: -All treatment options discussed with the patient including all alternatives, risks, complications.  -X-rays were obtained reviewed.  There is indistinctness present distal phalanx.  No soft tissue edema present. -I was able debride the loose skin and the distal phalanx was exposed.  I also debrided the ulcer with a #312 blade scalpel to ensure no deeper abscess.  Given the infection he has to the toe as  well as exposed distal phalanx I do recommend hallux amputation of the right toe.  We discussed the surgery as well as postoperative course.  We will plan on doing this next week.  Wound culture was obtained. -Doxycycline -The incision placement as well as the postoperative course was discussed with the patient. I discussed risks of the surgery which include, but not limited to, infection, bleeding, pain, swelling, need for further surgery, delayed or nonhealing, painful or ugly scar, numbness or sensation changes, over/under correction, recurrence, transfer lesions, further deformity, hardware failure, DVT/PE, loss of toe/foot. Patient understands these risks and wishes to proceed with surgery. The surgical consent was reviewed with the patient all 3 pages were signed. No promises or guarantees were given to the outcome of the procedure. All questions were answered to the best of my ability. Before the surgery the patient was encouraged to call the office if there is any further questions. The surgery will be performed at the [facility] on an outpatient basis. -Has a surgical shoe at home.  Recommend wearing this. -ABI was performed in the office which was normal. -Patient encouraged to call the office with any questions, concerns, change in symptoms.   Trula Slade DPM

## 2018-08-12 ENCOUNTER — Telehealth: Payer: Self-pay | Admitting: *Deleted

## 2018-08-12 LAB — WOUND CULTURE
MICRO NUMBER:: 565150
SPECIMEN QUALITY:: ADEQUATE

## 2018-08-12 NOTE — Telephone Encounter (Signed)
DOS 08/14/2018; CPT CODE: 52589 - AMPUTATION TOE MPJ JOINT HALLUX RIGHT FOOT  [employer]: Effective Date - 05/29/2018  Deductible - $0 Co-insurance - 100% Co-pay - $0 Out of pocket - $3,400 no accumulation  Reference # for the call - [id number]

## 2018-08-14 ENCOUNTER — Encounter: Payer: Self-pay | Admitting: Podiatry

## 2018-08-14 ENCOUNTER — Other Ambulatory Visit: Payer: Self-pay | Admitting: Podiatry

## 2018-08-14 DIAGNOSIS — M86071 Acute hematogenous osteomyelitis, right ankle and foot: Secondary | ICD-10-CM | POA: Diagnosis not present

## 2018-08-14 DIAGNOSIS — I10 Essential (primary) hypertension: Secondary | ICD-10-CM | POA: Diagnosis not present

## 2018-08-14 DIAGNOSIS — M86671 Other chronic osteomyelitis, right ankle and foot: Secondary | ICD-10-CM | POA: Diagnosis not present

## 2018-08-14 MED ORDER — PROMETHAZINE HCL 25 MG PO TABS: 25.0000 mg | ORAL_TABLET | Freq: Three times a day (TID) | ORAL | 0 refills | Status: DC | PRN

## 2018-08-14 MED ORDER — HYDROCODONE-ACETAMINOPHEN 5-325 MG PO TABS: 1.0000 | ORAL_TABLET | ORAL | 0 refills | Status: DC | PRN

## 2018-08-14 MED ORDER — SULFAMETHOXAZOLE-TRIMETHOPRIM 800-160 MG PO TABS: 1.0000 | ORAL_TABLET | Freq: Two times a day (BID) | ORAL | 0 refills | Status: DC

## 2018-08-14 NOTE — Progress Notes (Signed)
Postop medications sent 

## 2018-08-15 ENCOUNTER — Telehealth: Payer: Self-pay | Admitting: *Deleted

## 2018-08-15 LAB — NOVEL CORONAVIRUS, NAA: SARS-CoV-2, NAA: NOT DETECTED

## 2018-08-15 NOTE — Telephone Encounter (Signed)
Patient called back and stated that he was doing pretty good and has not taken any pain medicine and has had no pain at all and there is no fever or chills and no nausea and I have ice it and elevated it as well and I stated to call the office if any concerns or questions at [phone]. Lattie Haw

## 2018-08-15 NOTE — Telephone Encounter (Signed)
Called and left a message for the patient to call me at [phone] to check and see how patient was doing after surgery yesterday with Dr Jacqualyn Posey. Lattie Haw

## 2018-08-20 ENCOUNTER — Telehealth: Payer: Self-pay | Admitting: Podiatry

## 2018-08-20 ENCOUNTER — Other Ambulatory Visit: Payer: Self-pay

## 2018-08-20 ENCOUNTER — Ambulatory Visit (INDEPENDENT_AMBULATORY_CARE_PROVIDER_SITE_OTHER): Payer: Self-pay | Admitting: Podiatry

## 2018-08-20 ENCOUNTER — Ambulatory Visit (INDEPENDENT_AMBULATORY_CARE_PROVIDER_SITE_OTHER): Payer: Medicare HMO

## 2018-08-20 VITALS — Temp 98.2°F

## 2018-08-20 DIAGNOSIS — L97511 Non-pressure chronic ulcer of other part of right foot limited to breakdown of skin: Secondary | ICD-10-CM

## 2018-08-20 DIAGNOSIS — Z09 Encounter for follow-up examination after completed treatment for conditions other than malignant neoplasm: Secondary | ICD-10-CM

## 2018-08-20 MED ORDER — TRIAMCINOLONE ACETONIDE 0.1 % EX CREA: 1.0000 "application " | TOPICAL_CREAM | Freq: Two times a day (BID) | CUTANEOUS | 0 refills | Status: DC

## 2018-08-20 NOTE — Telephone Encounter (Signed)
Pt called stating that he is having bleeding from his surgery site and would like to know if he needs to come in sooner than his scheduled appt on Thursday, 6/25. Please give patient a call.

## 2018-08-20 NOTE — Telephone Encounter (Signed)
I called pt and offered an appt to have surgical site assessed by Gretta Arab, RN, pt agreed, and I transferred to scheduler to come in today.

## 2018-08-22 ENCOUNTER — Other Ambulatory Visit: Payer: Medicare HMO

## 2018-08-26 ENCOUNTER — Encounter: Payer: PPO | Admitting: Internal Medicine

## 2018-08-27 NOTE — Progress Notes (Signed)
Subjective: Aaron Fox is a 63 y.o. is seen today in office s/p right hallux amputation preformed on 08/13/2018.  He presents today as he noticed some blood on the bandage otherwise has been doing well.  He has not been taking any pain medication.  He is doing the offloading boot.  Denies any systemic complaints such as fevers, chills, nausea, vomiting. No calf pain, chest pain, shortness of breath.   Objective: General: No acute distress, AAOx3  DP/PT pulses palpable 2/4, CRT < 3 sec to all digits.  RIGHT foot: Incision is well coapted without any evidence of dehiscence and sutures are intact. There is no surrounding erythema, ascending cellulitis, fluctuance, crepitus, malodor, drainage/purulence. There is mild edema around the surgical site. There is no pain along the surgical site.  No active bleeding is noted. No other areas of tenderness to bilateral lower extremities.  No other open lesions or pre-ulcerative lesions.  No pain with calf compression, swelling, warmth, erythema.   Assessment and Plan:  Status post hallux amputation, doing well with no complications   -Treatment options discussed including all alternatives, risks, and complications -X-rays are obtained and reviewed.  Status post hallux amputation.  No evidence of acute fracture. -Patient had pain over the incision followed by a dry sterile dressing. -Continue cam boot and limit weightbearing. -Elevation -Pain medication as needed -Finish course of antibiotics -Monitor for any clinical signs or symptoms of infection and DVT/PE and directed to call the office immediately should any occur or go to the ER. -Follow-up as scheduled procedure removal or sooner if any problems arise. In the meantime, encouraged to call the office with any questions, concerns, change in symptoms.   Celesta Gentile, DPM

## 2018-08-29 ENCOUNTER — Other Ambulatory Visit: Payer: Self-pay

## 2018-08-29 ENCOUNTER — Ambulatory Visit (INDEPENDENT_AMBULATORY_CARE_PROVIDER_SITE_OTHER): Payer: Medicare HMO | Admitting: Podiatry

## 2018-08-29 VITALS — Temp 98.0°F

## 2018-08-29 DIAGNOSIS — M869 Osteomyelitis, unspecified: Secondary | ICD-10-CM

## 2018-08-29 DIAGNOSIS — Z09 Encounter for follow-up examination after completed treatment for conditions other than malignant neoplasm: Secondary | ICD-10-CM

## 2018-08-29 DIAGNOSIS — L97511 Non-pressure chronic ulcer of other part of right foot limited to breakdown of skin: Secondary | ICD-10-CM

## 2018-09-02 ENCOUNTER — Other Ambulatory Visit: Payer: Self-pay | Admitting: Internal Medicine

## 2018-09-02 ENCOUNTER — Other Ambulatory Visit: Payer: Medicare HMO

## 2018-09-02 DIAGNOSIS — Z20822 Contact with and (suspected) exposure to covid-19: Secondary | ICD-10-CM

## 2018-09-02 DIAGNOSIS — R6889 Other general symptoms and signs: Secondary | ICD-10-CM | POA: Diagnosis not present

## 2018-09-05 ENCOUNTER — Ambulatory Visit (INDEPENDENT_AMBULATORY_CARE_PROVIDER_SITE_OTHER): Payer: Self-pay | Admitting: Podiatry

## 2018-09-05 ENCOUNTER — Other Ambulatory Visit: Payer: Self-pay

## 2018-09-05 ENCOUNTER — Encounter: Payer: Self-pay | Admitting: Podiatry

## 2018-09-05 VITALS — Temp 97.5°F

## 2018-09-05 DIAGNOSIS — Z09 Encounter for follow-up examination after completed treatment for conditions other than malignant neoplasm: Secondary | ICD-10-CM

## 2018-09-05 NOTE — Progress Notes (Signed)
Subjective:  Patient ID: Aaron Fox, male    DOB: Feb 18, 1956,  MRN: [id number]  Chief Complaint  Patient presents with  . Routine Post Op    DOS: 08/14/2018 AMPUTATION TOE MPJ JOINT HALLUX RIGHT " my foot is feeling fine, I finished my antibiotics"     DOS: 08/14/2018 Procedure: Right hallux amputation  63 y.o. male returns for post-op check. Doing well denies pain or discomfort. Completed antibiotics. Denies post-op issues or concenrs.  Review of Systems: Negative except as noted in the HPI. Denies N/V/F/Ch.  Past Medical History:  Diagnosis Date  . ALLERGIC RHINITIS 07/02/2008  . Atrial fibrillation ([place])   . CHF 06/19/2008  . COUGH DUE TO ACE INHIBITORS 10/05/2008  . DIABETES MELLITUS, TYPE II 09/22/2006  . ED (erectile dysfunction)   . Glaucoma   . GOUT 09/22/2006  . Hx of adenomatous colonic polyps 06/09/2015  . HYPERCHOLESTEROLEMIA 07/02/2008  . HYPERTENSION 05/31/2009  . HYPOGONADISM, MALE 01/15/2007  . Hypokalemia   . 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 Lymphadenopathy   . Peripheral neuropathy   . PUD (peptic ulcer disease)   . Rheumatoid arthritis(714.0) 09/22/2006    Current Outpatient Medications:  .  Accu-Chek Softclix Lancets lancets, Use to monitor glucose levels two times per day; E10.49, Disp: 100 each, Rfl: 12 .  Alcohol Swabs (B-D SINGLE USE SWABS REGULAR) PADS, Use to cleanse site prior to use of lancet to obtain droplet of blood two times per day; E10.49, Disp: 200 each, Rfl: 2 .  aspirin 81 MG tablet, Take 81 mg by mouth daily.  , Disp: , Rfl:  .  atorvastatin (LIPITOR) 10 MG tablet, Take 1 tablet (10 mg total) by mouth daily., Disp: 90 tablet, Rfl: 1 .  BD INSULIN SYRINGE ULTRAFINE 31G X 5/16" 0.5 ML MISC, Use with insulin., Disp: 100 each, Rfl: 5 .  Blood Glucose Calibration (ACCU-CHEK AVIVA) SOLN, 1 each by In Vitro route as needed. Use solution prn to evaluate accuracy of meter, Disp: 1 each, Rfl: 1 .  Blood Glucose Monitoring Suppl (ACCU-CHEK  AVIVA PLUS) w/Device KIT, 1 each by Does not apply route 2 (two) times daily. Use to monitor glucose levels two times per day; E10.49, Disp: 1 kit, Rfl: 0 .  doxycycline (VIBRA-TABS) 100 MG tablet, Take 1 tablet (100 mg total) by mouth 2 (two) times daily., Disp: 20 tablet, Rfl: 0 .  gabapentin (NEURONTIN) 300 MG capsule, Take 1 capsule (300 mg total) by mouth 2 (two) times daily., Disp: 180 capsule, Rfl: 2 .  glucose blood (ACCU-CHEK AVIVA PLUS) test strip, Use to monitor glucose levels two times per day; E10.49, Disp: 100 each, Rfl: 12 .  HYDROcodone-acetaminophen (NORCO/VICODIN) 5-325 MG tablet, Take 1 tablet by mouth every 4 (four) hours as needed., Disp: 20 tablet, Rfl: 0 .  insulin NPH Human (HUMULIN N,NOVOLIN N) 100 UNIT/ML injection, Inject 0.26 mLs (26 Units total) into the skin every morning., Disp: 30 mL, Rfl: 2 .  insulin regular (NOVOLIN R RELION) 100 units/mL injection, Inject 0.05 mLs (5 Units total) daily with supper into the skin. And syringes 2/day, Disp: 10 mL, Rfl: 11 .  losartan (COZAAR) 100 MG tablet, Take 1 tablet (100 mg total) by mouth daily., Disp: 90 tablet, Rfl: 3 .  MONOJECT ULTRA COMFORT SYRINGE 29G X 1/2" 1 ML MISC, USE TO INJECT INSULIN 4 TIMES PER DAY, Disp: 360 each, Rfl: 3 .  mupirocin ointment (BACTROBAN) 2 %, Apply 1 application topically 2 (two) times daily., Disp: 30  g, Rfl: 2 .  promethazine (PHENERGAN) 25 MG tablet, Take 1 tablet (25 mg total) by mouth every 8 (eight) hours as needed for nausea or vomiting., Disp: 20 tablet, Rfl: 0 .  sulfamethoxazole-trimethoprim (BACTRIM DS) 800-160 MG tablet, Take 1 tablet by mouth 2 (two) times daily., Disp: 20 tablet, Rfl: 0 .  triamcinolone cream (KENALOG) 0.1 %, Apply 1 application topically 2 (two) times daily., Disp: 30 g, Rfl: 0  Social History   Tobacco Use  Smoking Status Former Smoker  . Quit date: 11/02/1999  . Years since quitting: 18.8  Smokeless Tobacco Never Used    Allergies  Allergen Reactions  .  Penicillins Swelling    Swelling of the hands   . Terbinafine Hcl Hives  . Zestril [Lisinopril] Cough  . Ciprofloxacin Rash  . Terbinafine Hcl Hives   Objective:   Vitals:   09/05/18 1028  Temp: (!) 97.5 F (36.4 C)   There is no height or weight on file to calculate BMI. Constitutional Well developed. Well nourished.  Vascular Foot warm and well perfused. Capillary refill normal to all digits.   Neurologic Normal speech. Oriented to person, place, and time. Epicritic sensation to light touch grossly present bilaterally.  Dermatologic Skin well-healed.  No warmth erythema no signs of acute infection.  Developing callus sub-met 2 right.  Orthopedic: No tenderness to palpation noted about the surgical site.  Hammertoe deformity right second toe   Radiographs: None today Assessment:   1. Surgery follow-up    Plan:  Patient was evaluated and treated and all questions answered.  S/p foot surgery right -Progressing as expected post-operatively. -XR: None -WB Status: Weightbearing as tolerated in surgical shoe -Sutures: Removed today.  Steri's applied.. -Medications: No need for continued antibiotic therapy.  No signs of infection today -Foot redressed. -Advised to continue with surgical shoe until follow-up with Dr. Jacqualyn Posey in 2 weeks.  We discussed we will likely benefit from toe filler once swelling is resolved.  Likely benefit from second MPJ offloading to develop a second MPJ callus.  Second toe hammertoes developing will likely need diabetic shoes as well.  Return in about 2 weeks (around 09/19/2018) for [facility] patient.

## 2018-09-07 LAB — NOVEL CORONAVIRUS, NAA: SARS-CoV-2, NAA: NOT DETECTED

## 2018-09-09 NOTE — Progress Notes (Signed)
Subjective: Aaron Fox is a 63 y.o. is seen today in office s/p right hallux amputation preformed on 08/13/2018.  He still in surgical shoe. Denies any systemic complaints such as fevers, chills, nausea, vomiting. No calf pain, chest pain, shortness of breath.   Objective: General: No acute distress, AAOx3  DP/PT pulses palpable 2/4, CRT < 3 sec to all digits.  RIGHT foot: Incision is well coapted without any evidence of dehiscence and sutures are intact. There is no surrounding erythema, ascending cellulitis, fluctuance, crepitus, malodor, drainage/purulence. There is improved edema around the surgical site. There is no pain along the surgical site.  No active bleeding is noted.  No signs of infection dehiscence No other areas of tenderness to bilateral lower extremities.  No other open lesions or pre-ulcerative lesions.  No pain with calf compression, swelling, warmth, erythema.   Assessment and Plan:  Status post hallux amputation, doing well with no complications   -Treatment options discussed including all alternatives, risks, and complications -Reviewed the sutures removed today without complications.  Remainder the sutures remain intact.  Ointment and a dressing was applied.  Keep dressing clean, dry, intact.  Remain in surgical shoe.  Elevation. -Finish course of antibiotics. -Monitor for any clinical signs or symptoms of infection and directed to call the office immediately should any occur or go to the ER.  Trula Slade DPM

## 2018-09-19 ENCOUNTER — Other Ambulatory Visit: Payer: Self-pay

## 2018-09-19 ENCOUNTER — Ambulatory Visit (INDEPENDENT_AMBULATORY_CARE_PROVIDER_SITE_OTHER): Payer: Medicare HMO

## 2018-09-19 ENCOUNTER — Encounter: Payer: Self-pay | Admitting: Podiatry

## 2018-09-19 ENCOUNTER — Ambulatory Visit (INDEPENDENT_AMBULATORY_CARE_PROVIDER_SITE_OTHER): Payer: Self-pay | Admitting: Podiatry

## 2018-09-19 VITALS — Temp 96.2°F

## 2018-09-19 DIAGNOSIS — L97511 Non-pressure chronic ulcer of other part of right foot limited to breakdown of skin: Secondary | ICD-10-CM | POA: Diagnosis not present

## 2018-09-20 ENCOUNTER — Other Ambulatory Visit: Payer: Self-pay | Admitting: *Deleted

## 2018-09-20 NOTE — Progress Notes (Signed)
Subjective: Jareth JAILYN LANGHORST is a 63 y.o. is seen today in office s/p right hallux amputation preformed on 08/13/2018.  He states that he is doing well and not having any pain. He is still in a surgical shoe. No drainage or pus.  Denies any systemic complaints such as fevers, chills, nausea, vomiting. No calf pain, chest pain, shortness of breath.   Objective: General: No acute distress, AAOx3  DP/PT pulses palpable 2/4, CRT < 3 sec to all digits.  RIGHT foot: Incision is well coapted without any evidence of dehiscence and a scar has formed. There is minimal edema. There is no erythema, ascending cellulitis. There is no fluctuance or crepitance. No pain. Hammertoes present.  No other open lesions or pre-ulcerative lesions.  No pain with calf compression, swelling, warmth, erythema.   Assessment and Plan:  Status post hallux amputation, doing well with no complications   -Treatment options discussed including all alternatives, risks, and complications -X-rays were obtained and reviewed with the patient. Status post hallux amputation. No evidence of osteomyelitis or soft tissue emphysema.  -Amputation site is well-healed.  For now we will continue with surgical shoe.  He was measured today for diabetic shoes with a toe filler on the right side.  Given his history amputation of the right foot, significant hammertoe deformity to help with no further indication acute pain.  Diabetic shoes are warranted. -Discussed the importance of daily foot inspection.  Return in about 4 weeks (around 10/17/2018).  Trula Slade DPM   -Reviewed the sutures removed today without complications.  Remainder the sutures remain intact.  Ointment and a dressing was applied.  Keep dressing clean, dry, intact.  Remain in surgical shoe.  Elevation. -Finish course of antibiotics. -Monitor for any clinical signs or symptoms of infection and directed to call the office immediately should any occur or go to the  ER.  Trula Slade DPM

## 2018-10-18 ENCOUNTER — Encounter: Payer: Self-pay | Admitting: Podiatry

## 2018-10-18 ENCOUNTER — Ambulatory Visit (INDEPENDENT_AMBULATORY_CARE_PROVIDER_SITE_OTHER): Payer: Medicare HMO | Admitting: Podiatry

## 2018-10-18 ENCOUNTER — Other Ambulatory Visit: Payer: Self-pay

## 2018-10-18 VITALS — Temp 97.2°F

## 2018-10-18 DIAGNOSIS — Z09 Encounter for follow-up examination after completed treatment for conditions other than malignant neoplasm: Secondary | ICD-10-CM

## 2018-10-18 DIAGNOSIS — M79674 Pain in right toe(s): Secondary | ICD-10-CM

## 2018-10-18 DIAGNOSIS — B351 Tinea unguium: Secondary | ICD-10-CM

## 2018-10-18 DIAGNOSIS — L97511 Non-pressure chronic ulcer of other part of right foot limited to breakdown of skin: Secondary | ICD-10-CM

## 2018-10-18 DIAGNOSIS — M79675 Pain in left toe(s): Secondary | ICD-10-CM

## 2018-10-18 NOTE — Progress Notes (Signed)
Subjective: Aaron Fox is a 63 y.o. is seen today in office s/p right hallux amputation preformed on 08/13/2018.  States he is doing well. Awaiting his insert with toe filler.  He has no concerns today.  Denies any systemic complaints such as fevers, chills, nausea, vomiting. No calf pain, chest pain, shortness of breath.   Objective: General: No acute distress, AAOx3  DP/PT pulses palpable 2/4, CRT < 3 sec to all digits.  RIGHT foot: Incision is well coapted without any evidence of dehiscence and a scar has formed.  Incision is well-healed there is no edema, erythema, drainage or pus or any signs of infection.  The nails appear to be hypertrophic, dystrophic, discolored nails 1-5 on the left and 4 and 5 on the right.  No signs of infection.  Significant deformity to the right second toe. No other open lesions or pre-ulcerative lesions.  No pain with calf compression, swelling, warmth, erythema.   Assessment and Plan:  Status post hallux amputation, doing well with no complications   -Treatment options discussed including all alternatives, risks, and complications -Overall is doing well incision site is well-healed.  Await diabetic insert with toe filler.  Monitor for any skin breakdown.  Monitor the right second toe as it is quite contracted. -Nails debrided x7 any complications or bleeding as a courtesy -Discussed importance of daily foot inspection  Return in about 3 months (around 01/18/2019).  Trula Slade DPM

## 2018-10-22 ENCOUNTER — Encounter: Payer: Self-pay | Admitting: Internal Medicine

## 2018-10-24 ENCOUNTER — Other Ambulatory Visit: Payer: Self-pay

## 2018-10-25 ENCOUNTER — Encounter: Payer: Self-pay | Admitting: Family Medicine

## 2018-10-25 ENCOUNTER — Ambulatory Visit (INDEPENDENT_AMBULATORY_CARE_PROVIDER_SITE_OTHER): Payer: Medicare HMO | Admitting: Family Medicine

## 2018-10-25 VITALS — BP 130/68 | HR 76 | Temp 99.2°F | Resp 18 | Ht 71.5 in | Wt 223.0 lb

## 2018-10-25 DIAGNOSIS — Z23 Encounter for immunization: Secondary | ICD-10-CM | POA: Diagnosis not present

## 2018-10-25 DIAGNOSIS — E1065 Type 1 diabetes mellitus with hyperglycemia: Secondary | ICD-10-CM | POA: Diagnosis not present

## 2018-10-25 DIAGNOSIS — I5030 Unspecified diastolic (congestive) heart failure: Secondary | ICD-10-CM | POA: Diagnosis not present

## 2018-10-25 DIAGNOSIS — E7800 Pure hypercholesterolemia, unspecified: Secondary | ICD-10-CM

## 2018-10-25 DIAGNOSIS — E1049 Type 1 diabetes mellitus with other diabetic neurological complication: Secondary | ICD-10-CM | POA: Diagnosis not present

## 2018-10-25 DIAGNOSIS — Z7689 Persons encountering health services in other specified circumstances: Secondary | ICD-10-CM | POA: Diagnosis not present

## 2018-10-25 DIAGNOSIS — H4010X0 Unspecified open-angle glaucoma, stage unspecified: Secondary | ICD-10-CM | POA: Diagnosis not present

## 2018-10-25 DIAGNOSIS — Z125 Encounter for screening for malignant neoplasm of prostate: Secondary | ICD-10-CM

## 2018-10-25 DIAGNOSIS — IMO0002 Reserved for concepts with insufficient information to code with codable children: Secondary | ICD-10-CM

## 2018-10-25 NOTE — Progress Notes (Signed)
Subjective:    Patient ID: Aaron Fox, male    DOB: Apr 05, 1955, 63 y.o.   MRN: [id number]  HPI Patient is a very pleasant 63 year old African-American gentleman here today to establish care.  His mother, Rod Holler, is also my patient.  He has been seeing Dr. Loanne Drilling, his endocrinologist for general medical care.  It was recommended that he have a primary care physician.  I will be happy to see the patient.  He is already had Pneumovax 23.  He is not yet due for Prevnar 13.  He is due for a flu shot which he would like to receive today.  His tetanus shot is up-to-date.  Regarding cancer screening, he is already has an appointment to see Dr. Carlean Purl this year for a colonoscopy.  Reviewing his chart, the patient had a PSA in the past that was as high as 8.  However he does not recollect seeing a urologist.  His most recent PSA last year was under 3.  He denies any lower urinary tract symptoms but he is due to recheck his PSA.  Regarding his medical history, the patient has a history of type 1 diabetes mellitus.  He is currently on NPH.  He takes his NPH in the morning.  He takes 7 units of regular insulin in the evening.  His last hemoglobin A1c this spring was greater than 10.  Review of his chart also shows that he has a history of congestive heart failure.  His most recent echocardiogram in 2017 showed an ejection fraction of 60%.  Stress test obtained at that time revealed no reversible ischemia.  It appears he had diastolic dysfunction.  He also has a history of anemia, hypertension, and hyperlipidemia. Past Medical History:  Diagnosis Date  . ALLERGIC RHINITIS 07/02/2008  . Atrial fibrillation ([place])   . CHF 06/19/2008  . COUGH DUE TO ACE INHIBITORS 10/05/2008  . DIABETES MELLITUS, TYPE II 09/22/2006  . ED (erectile dysfunction)   . Glaucoma   . GOUT 09/22/2006  . Hx of adenomatous colonic polyps 06/09/2015  . HYPERCHOLESTEROLEMIA 07/02/2008  . HYPERTENSION 05/31/2009  . HYPOGONADISM, MALE 01/15/2007   . Hypokalemia   . 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 Lymphadenopathy   . Peripheral neuropathy   . PUD (peptic ulcer disease)   . Rheumatoid arthritis(714.0) 09/22/2006   Past Surgical History:  Procedure Laterality Date  . CATARACT EXTRACTION, BILATERAL    . COLONOSCOPY  2004  . LYMPH NODE BIOPSY    . LYMPH NODE BIOPSY Right 10/16/2014   Procedure: RIGHT INGUINAL LYMPH NODE BIOPSY;  Surgeon: Aviva Signs, MD;  Location: [facility];  Service: General;  Laterality: Right;   Current Outpatient Medications on File Prior to Visit  Medication Sig Dispense Refill  . aspirin 81 MG tablet Take 81 mg by mouth daily.      Marland Kitchen atorvastatin (LIPITOR) 10 MG tablet Take 1 tablet (10 mg total) by mouth daily. 90 tablet 1  . gabapentin (NEURONTIN) 300 MG capsule Take 1 capsule (300 mg total) by mouth 2 (two) times daily. 180 capsule 2  . insulin NPH Human (HUMULIN N,NOVOLIN N) 100 UNIT/ML injection Inject 0.26 mLs (26 Units total) into the skin every morning. (Patient taking differently: Inject 28 Units into the skin every morning. ) 30 mL 2  . insulin regular (NOVOLIN R RELION) 100 units/mL injection Inject 0.05 mLs (5 Units total) daily with supper into the skin. And syringes 2/day (Patient taking differently: Inject 7 Units into the  skin daily with supper. And syringes 2/day) 10 mL 11  . losartan (COZAAR) 100 MG tablet Take 1 tablet (100 mg total) by mouth daily. 90 tablet 3  . Accu-Chek Softclix Lancets lancets Use to monitor glucose levels two times per day; E10.49 100 each 12  . Alcohol Swabs (B-D SINGLE USE SWABS REGULAR) PADS Use to cleanse site prior to use of lancet to obtain droplet of blood two times per day; E10.49 200 each 2  . BD INSULIN SYRINGE ULTRAFINE 31G X 5/16" 0.5 ML MISC Use with insulin. 100 each 5  . Blood Glucose Calibration (ACCU-CHEK AVIVA) SOLN 1 each by In Vitro route as needed. Use solution prn to evaluate accuracy of meter 1 each 1  . Blood Glucose Monitoring Suppl  (ACCU-CHEK AVIVA PLUS) w/Device KIT 1 each by Does not apply route 2 (two) times daily. Use to monitor glucose levels two times per day; E10.49 1 kit 0  . glucose blood (ACCU-CHEK AVIVA PLUS) test strip Use to monitor glucose levels two times per day; E10.49 100 each 12  . MONOJECT ULTRA COMFORT SYRINGE 29G X 1/2" 1 ML MISC USE TO INJECT INSULIN 4 TIMES PER DAY 360 each 3   No current facility-administered medications on file prior to visit.    Allergies  Allergen Reactions  . Penicillins Swelling    Swelling of the hands   . Terbinafine Hcl Hives  . Zestril [Lisinopril] Cough  . Ciprofloxacin Rash  . Terbinafine Hcl Hives   Social History   Socioeconomic History  . Marital status: Married    Spouse name: Not on file  . Number of children: 3  . Years of education: Not on file  . Highest education level: Not on file  Occupational History  . Occupation: TEFL teacher: [employer]  Social Needs  . Financial resource strain: Not on file  . Food insecurity    Worry: Not on file    Inability: Not on file  . Transportation needs    Medical: Not on file    Non-medical: Not on file  Tobacco Use  . Smoking status: Former Smoker    Quit date: 11/02/1999    Years since quitting: 18.9  . Smokeless tobacco: Never Used  Substance and Sexual Activity  . Alcohol use: No    Alcohol/week: 0.0 standard drinks  . Drug use: No  . Sexual activity: Not on file  Lifestyle  . Physical activity    Days per week: Not on file    Minutes per session: Not on file  . Stress: Not on file  Relationships  . Social Herbalist on phone: Not on file    Gets together: Not on file    Attends religious service: Not on file    Active member of club or organization: Not on file    Attends meetings of clubs or organizations: Not on file    Relationship status: Not on file  . Intimate partner violence    Fear of current or ex partner: Not on file    Emotionally abused: Not on file     Physically abused: Not on file    Forced sexual activity: Not on file  Other Topics Concern  . Not on file  Social History Narrative   Regular exercise-yes   Family History  Problem Relation Age of Onset  . Heart disease Other        FH of CAD  . Colon polyps Paternal  Uncle   . Kidney disease Paternal Uncle   . Diabetes Paternal Grandfather   . Diabetes Maternal Uncle   . Cancer Neg Hx   . Colon cancer Neg Hx   . Gallbladder disease Neg Hx   . Esophageal cancer Neg Hx        Review of Systems  All other systems reviewed and are negative.      Objective:   Physical Exam Vitals signs reviewed.  Constitutional:      General: He is not in acute distress.    Appearance: Normal appearance. He is normal weight.  HENT:     Head: Normocephalic and atraumatic.     Right Ear: Tympanic membrane and ear canal normal.     Left Ear: Tympanic membrane and ear canal normal.     Nose: Nose normal. No congestion or rhinorrhea.     Mouth/Throat:     Pharynx: No oropharyngeal exudate or posterior oropharyngeal erythema.  Eyes:     Conjunctiva/sclera: Conjunctivae normal.     Pupils: Pupils are equal, round, and reactive to light.  Neck:     Musculoskeletal: Normal range of motion and neck supple.     Vascular: No carotid bruit.  Cardiovascular:     Rate and Rhythm: Normal rate and regular rhythm.     Pulses: Normal pulses.     Heart sounds: Normal heart sounds. No murmur. No friction rub. No gallop.   Pulmonary:     Effort: Pulmonary effort is normal. No respiratory distress.     Breath sounds: Normal breath sounds. No stridor. No wheezing, rhonchi or rales.  Chest:     Chest wall: No tenderness.  Abdominal:     General: Abdomen is flat. Bowel sounds are normal. There is no distension.     Palpations: Abdomen is soft. There is no mass.     Tenderness: There is no abdominal tenderness. There is no guarding or rebound.     Hernia: No hernia is present.  Lymphadenopathy:      Cervical: No cervical adenopathy.  Skin:    Coloration: Skin is not jaundiced.     Findings: No bruising or erythema.  Neurological:     General: No focal deficit present.     Mental Status: He is alert and oriented to person, place, and time. Mental status is at baseline.     Cranial Nerves: No cranial nerve deficit.     Sensory: No sensory deficit.     Motor: No weakness.     Coordination: Coordination normal.     Gait: Gait normal.     Deep Tendon Reflexes: Reflexes normal.  Psychiatric:        Mood and Affect: Mood normal.        Behavior: Behavior normal.        Thought Content: Thought content normal.        Judgment: Judgment normal.           Assessment & Plan:  Establishing care with new doctor, encounter for - Plan: PSA, CBC with Differential/Platelet, COMPLETE METABOLIC PANEL WITH GFR, Lipid panel  Prostate cancer screening - Plan: PSA  Pure hypercholesterolemia - Plan: CBC with Differential/Platelet, COMPLETE METABOLIC PANEL WITH GFR, Lipid panel  Open-angle glaucoma of right eye, unspecified glaucoma stage, unspecified open-angle glaucoma type - Plan: Ambulatory referral to Ophthalmology  Type 1 diabetes mellitus with neurological manifestations, uncontrolled ([place])  Physical exam today is normal.  Blood pressure is excellent.  I will check a PSA to  screen for prostate cancer.  Colon cancer screening has already been arranged with his gastroenterologist.  I will check a CBC to monitor his anemia.  I will check his renal function with a CMP and I will also check a fasting lipid panel.  His goal LDL cholesterol is less than 100.  He has a history of glaucoma and has not seen an eye doctor in more than 2 years so we will schedule him to see an ophthalmologist.  I will defer the management of his diabetes to his endocrinologist.  However if his lab work will allow it, given his history of congestive heart failure from diastolic dysfunction, I wonder if the patient would  benefit from [place] even despite being a type I diabetic.  However I would certainly discuss this with his endocrinologist prior to instituting any change.

## 2018-10-25 NOTE — Addendum Note (Signed)
Addended by: Shary Decamp B on: 10/25/2018 11:52 AM   Modules accepted: Orders

## 2018-10-26 LAB — CBC WITH DIFFERENTIAL/PLATELET
Absolute Monocytes: 608 cells/uL (ref 200–950)
Basophils Absolute: 59 cells/uL (ref 0–200)
Basophils Relative: 1.2 %
Eosinophils Absolute: 309 cells/uL (ref 15–500)
Eosinophils Relative: 6.3 %
HCT: 33.8 % — ABNORMAL LOW (ref 38.5–50.0)
Hemoglobin: 11.3 g/dL — ABNORMAL LOW (ref 13.2–17.1)
Lymphs Abs: 1328 cells/uL (ref 850–3900)
MCH: 28.8 pg (ref 27.0–33.0)
MCHC: 33.4 g/dL (ref 32.0–36.0)
MCV: 86.2 fL (ref 80.0–100.0)
MPV: 11.2 fL (ref 7.5–12.5)
Monocytes Relative: 12.4 %
Neutro Abs: 2597 cells/uL (ref 1500–7800)
Neutrophils Relative %: 53 %
Platelets: 170 10*3/uL (ref 140–400)
RBC: 3.92 10*6/uL — ABNORMAL LOW (ref 4.20–5.80)
RDW: 14.9 % (ref 11.0–15.0)
Total Lymphocyte: 27.1 %
WBC: 4.9 10*3/uL (ref 3.8–10.8)

## 2018-10-26 LAB — COMPLETE METABOLIC PANEL WITH GFR
AG Ratio: 1.3 (calc) (ref 1.0–2.5)
ALT: 32 U/L (ref 9–46)
AST: 48 U/L — ABNORMAL HIGH (ref 10–35)
Albumin: 3.8 g/dL (ref 3.6–5.1)
Alkaline phosphatase (APISO): 86 U/L (ref 35–144)
BUN: 23 mg/dL (ref 7–25)
CO2: 21 mmol/L (ref 20–32)
Calcium: 8.9 mg/dL (ref 8.6–10.3)
Chloride: 110 mmol/L (ref 98–110)
Creat: 0.99 mg/dL (ref 0.70–1.25)
GFR, Est African American: 94 mL/min/{1.73_m2} (ref >=60)
GFR, Est Non African American: 81 mL/min/{1.73_m2} (ref >=60)
Globulin: 3 g/dL (calc) (ref 1.9–3.7)
Glucose, Bld: 157 mg/dL — ABNORMAL HIGH (ref 65–99)
Potassium: 4.6 mmol/L (ref 3.5–5.3)
Sodium: 141 mmol/L (ref 135–146)
Total Bilirubin: 0.4 mg/dL (ref 0.2–1.2)
Total Protein: 6.8 g/dL (ref 6.1–8.1)

## 2018-10-26 LAB — LIPID PANEL
Cholesterol: 117 mg/dL (ref <200)
HDL: 43 mg/dL (ref >=40)
LDL Cholesterol (Calc): 60 mg/dL (calc)
Non-HDL Cholesterol (Calc): 74 mg/dL (calc) (ref <130)
Total CHOL/HDL Ratio: 2.7 (calc) (ref <5.0)
Triglycerides: 67 mg/dL (ref <150)

## 2018-10-26 LAB — PSA: PSA: 1.1 ng/mL (ref <=4.0)

## 2018-11-06 ENCOUNTER — Other Ambulatory Visit: Payer: Self-pay

## 2018-11-06 ENCOUNTER — Encounter: Payer: Self-pay | Admitting: Internal Medicine

## 2018-11-06 ENCOUNTER — Ambulatory Visit (AMBULATORY_SURGERY_CENTER): Payer: Self-pay | Admitting: *Deleted

## 2018-11-06 VITALS — Temp 96.8°F | Ht 71.5 in | Wt 224.0 lb

## 2018-11-06 DIAGNOSIS — Z86010 Personal history of colonic polyps: Secondary | ICD-10-CM

## 2018-11-06 NOTE — Progress Notes (Signed)
Pt has had 2 negative COVID tests due to wife having covid- wife was positive June 2020  No egg or soy allergy known to patient  No issues with past sedation with any surgeries  or procedures, no intubation problems  No diet pills per patient No home 02 use per patient  No blood thinners per patient  Pt denies issues with constipation  Past hx A fib in 2012- CHF in 2012- no blood thinners  EMMI video sent to pt's e mail

## 2018-11-12 ENCOUNTER — Telehealth: Payer: Self-pay

## 2018-11-12 NOTE — Telephone Encounter (Signed)
Covid-19 screening questions   Do you now or have you had a fever in the last 14 days?  Do you have any respiratory symptoms of shortness of breath or cough now or in the last 14 days?  Do you have any family members or close contacts with diagnosed or suspected Covid-19 in the past 14 days?  Have you been tested for Covid-19 and found to be positive?       

## 2018-11-13 ENCOUNTER — Encounter: Payer: Self-pay | Admitting: Internal Medicine

## 2018-11-13 ENCOUNTER — Other Ambulatory Visit: Payer: Self-pay

## 2018-11-13 ENCOUNTER — Ambulatory Visit (AMBULATORY_SURGERY_CENTER): Payer: Medicare HMO | Admitting: Internal Medicine

## 2018-11-13 VITALS — BP 117/62 | HR 63 | Temp 97.4°F | Resp 13 | Ht 71.0 in | Wt 223.0 lb

## 2018-11-13 DIAGNOSIS — Z86010 Personal history of colonic polyps: Secondary | ICD-10-CM

## 2018-11-13 MED ORDER — SODIUM CHLORIDE 0.9 % IV SOLN: 500.0000 mL | Freq: Once | INTRAVENOUS | Status: DC

## 2018-11-13 NOTE — Op Note (Signed)
[place] Patient Name: Aaron Fox Procedure Date: 11/13/2018 8:48 AM MRN: [id number] Endoscopist: Gatha Mayer , MD Age: 63 Referring MD:  Date of Birth: Feb 03, 1956 Gender: Male Account #: [id number] Procedure:                Colonoscopy Indications:              Surveillance: Personal history of adenomatous                            polyps on last colonoscopy 3 years ago Medicines:                Propofol per Anesthesia, Monitored Anesthesia Care Procedure:                Pre-Anesthesia Assessment:                           - Prior to the procedure, a History and Physical                            was performed, and patient medications and                            allergies were reviewed. The patient's tolerance of                            previous anesthesia was also reviewed. The risks                            and benefits of the procedure and the sedation                            options and risks were discussed with the patient.                            All questions were answered, and informed consent                            was obtained. Prior Anticoagulants: The patient has                            taken no previous anticoagulant or antiplatelet                            agents. ASA Grade Assessment: II - A patient with                            mild systemic disease. After reviewing the risks                            and benefits, the patient was deemed in                            satisfactory condition to undergo the procedure.  After obtaining informed consent, the colonoscope                            was passed under direct vision. Throughout the                            procedure, the patient's blood pressure, pulse, and                            oxygen saturations were monitored continuously. The                            Colonoscope was introduced through the anus and   advanced to the the cecum, identified by                            appendiceal orifice and ileocecal valve. The                            colonoscopy was performed without difficulty. The                            patient tolerated the procedure well. The quality                            of the bowel preparation was good. The bowel                            preparation used was Miralax via split dose                            instruction. The ileocecal valve, appendiceal                            orifice, and rectum were photographed. Scope In: 9:01:33 AM Scope Out: 9:18:16 AM Scope Withdrawal Time: 0 hours 11 minutes 44 seconds  Total Procedure Duration: 0 hours 16 minutes 43 seconds  Findings:                 The perianal and digital rectal examinations were                            normal. Pertinent negatives include normal prostate                            (size, shape, and consistency).                           Diverticula were found in the sigmoid colon.                           The exam was otherwise without abnormality on                            direct and retroflexion views. Complications:  No immediate complications. Estimated Blood Loss:     Estimated blood loss: none. Impression:               - Diverticulosis in the sigmoid colon.                           - The examination was otherwise normal on direct                            and retroflexion views.                           - No specimens collected.                           - Personal history of colonic polyps. 2017 - 4                            adenomas max 7 mm Recommendation:           - Patient has a contact number available for                            emergencies. The signs and symptoms of potential                            delayed complications were discussed with the                            patient. Return to normal activities tomorrow.                            Written  discharge instructions were provided to the                            patient.                           - Resume previous diet.                           - Continue present medications.                           - Repeat colonoscopy in 5 years for surveillance. Gatha Mayer, MD 11/13/2018 9:24:06 AM This report has been signed electronically.

## 2018-11-13 NOTE — Progress Notes (Signed)
Pt's states no medical or surgical changes since previsit or office visit. 

## 2018-11-13 NOTE — Progress Notes (Signed)
PT taken to PACU. Monitors in place. VSS. Report given to RN. 

## 2018-11-13 NOTE — Patient Instructions (Addendum)
 No polyps today so your next routine colonoscopy should be in 5 years - 2025.  I appreciate the opportunity to care for you. Carl E. Gessner, MD, FACG  YOU HAD AN ENDOSCOPIC PROCEDURE TODAY AT THE [facility]:   Refer to the procedure report that was given to you for any specific questions about what was found during the examination.  If the procedure report does not answer your questions, please call your gastroenterologist to clarify.  If you requested that your care partner not be given the details of your procedure findings, then the procedure report has been included in a sealed envelope for you to review at your convenience later.  YOU SHOULD EXPECT: Some feelings of bloating in the abdomen. Passage of more gas than usual.  Walking can help get rid of the air that was put into your GI tract during the procedure and reduce the bloating. If you had a lower endoscopy (such as a colonoscopy or flexible sigmoidoscopy) you may notice spotting of blood in your stool or on the toilet paper. If you underwent a bowel prep for your procedure, you may not have a normal bowel movement for a few days.  Please Note:  You might notice some irritation and congestion in your nose or some drainage.  This is from the oxygen used during your procedure.  There is no need for concern and it should clear up in a day or so.  SYMPTOMS TO REPORT IMMEDIATELY:   Following lower endoscopy (colonoscopy or flexible sigmoidoscopy):  Excessive amounts of blood in the stool  Significant tenderness or worsening of abdominal pains  Swelling of the abdomen that is new, acute  Fever of 100F or higher   For urgent or emergent issues, a gastroenterologist can be reached at any hour by calling [phone].   DIET:  We do recommend a small meal at first, but then you may proceed to your regular diet.  Drink plenty of fluids but you should avoid alcoholic beverages for 24 hours.  ACTIVITY:  You should  plan to take it easy for the rest of today and you should NOT DRIVE or use heavy machinery until tomorrow (because of the sedation medicines used during the test).    FOLLOW UP: Our staff will call the number listed on your records 48-72 hours following your procedure to check on you and address any questions or concerns that you may have regarding the information given to you following your procedure. If we do not reach you, we will leave a message.  We will attempt to reach you two times.  During this call, we will ask if you have developed any symptoms of COVID 19. If you develop any symptoms (ie: fever, flu-like symptoms, shortness of breath, cough etc.) before then, please call [phone].  If you test positive for Covid 19 in the 2 weeks post procedure, please call and report this information to us.    If any biopsies were taken you will be contacted by phone or by letter within the next 1-3 weeks.  Please call us at [phone] if you have not heard about the biopsies in 3 weeks.    SIGNATURES/CONFIDENTIALITY: You and/or your care partner have signed paperwork which will be entered into your electronic medical record.  These signatures attest to the fact that that the information above on your After Visit Summary has been reviewed and is understood.  Full responsibility of the confidentiality of this discharge information lies with   and/or your care-partner. 

## 2018-11-15 ENCOUNTER — Telehealth: Payer: Self-pay | Admitting: *Deleted

## 2018-11-15 ENCOUNTER — Other Ambulatory Visit: Payer: Self-pay

## 2018-11-15 ENCOUNTER — Ambulatory Visit: Payer: Medicare HMO | Admitting: Orthotics

## 2018-11-15 NOTE — Telephone Encounter (Signed)
1. Have you developed a fever since your procedure? no  2.   Have you had an respiratory symptoms (SOB or cough) since your procedure? no  3.   Have you tested positive for COVID 19 since your procedure no  4.   Have you had any family members/close contacts diagnosed with the COVID 19 since your procedure?  no   If yes to any of these questions please route to Joylene John, RN and Alphonsa Gin, Therapist, sports.  Follow up Call-  Call back number 11/13/2018  Post procedure Call Back phone  # [phone]  Permission to leave phone message Yes  Some recent data might be hidden     Patient questions:  Do you have a fever, pain , or abdominal swelling? No. Pain Score  0 *  Have you tolerated food without any problems? Yes.    Have you been able to return to your normal activities? Yes.    Do you have any questions about your discharge instructions: Diet   No. Medications  No. Follow up visit  No.  Do you have questions or concerns about your Care? No.  Actions: * If pain score is 4 or above: No action needed, pain <4.

## 2018-11-21 DIAGNOSIS — H35033 Hypertensive retinopathy, bilateral: Secondary | ICD-10-CM | POA: Diagnosis not present

## 2018-11-21 DIAGNOSIS — H40013 Open angle with borderline findings, low risk, bilateral: Secondary | ICD-10-CM | POA: Diagnosis not present

## 2018-11-21 DIAGNOSIS — H524 Presbyopia: Secondary | ICD-10-CM | POA: Diagnosis not present

## 2018-11-21 DIAGNOSIS — E113293 Type 2 diabetes mellitus with mild nonproliferative diabetic retinopathy without macular edema, bilateral: Secondary | ICD-10-CM | POA: Diagnosis not present

## 2018-11-21 DIAGNOSIS — Z961 Presence of intraocular lens: Secondary | ICD-10-CM | POA: Diagnosis not present

## 2018-11-21 LAB — HM DIABETES EYE EXAM

## 2018-11-26 ENCOUNTER — Encounter: Payer: Self-pay | Admitting: Family Medicine

## 2018-11-26 DIAGNOSIS — E11319 Type 2 diabetes mellitus with unspecified diabetic retinopathy without macular edema: Secondary | ICD-10-CM | POA: Insufficient documentation

## 2018-11-29 ENCOUNTER — Other Ambulatory Visit: Payer: Self-pay

## 2018-11-29 ENCOUNTER — Ambulatory Visit: Payer: Medicare HMO | Admitting: Orthotics

## 2018-11-29 ENCOUNTER — Encounter (INDEPENDENT_AMBULATORY_CARE_PROVIDER_SITE_OTHER): Payer: Medicare HMO | Admitting: Ophthalmology

## 2018-11-29 DIAGNOSIS — M2042 Other hammer toe(s) (acquired), left foot: Secondary | ICD-10-CM | POA: Diagnosis not present

## 2018-11-29 DIAGNOSIS — E1140 Type 2 diabetes mellitus with diabetic neuropathy, unspecified: Secondary | ICD-10-CM | POA: Diagnosis not present

## 2018-11-29 DIAGNOSIS — L97509 Non-pressure chronic ulcer of other part of unspecified foot with unspecified severity: Secondary | ICD-10-CM | POA: Diagnosis not present

## 2018-11-29 DIAGNOSIS — Z89411 Acquired absence of right great toe: Secondary | ICD-10-CM | POA: Diagnosis not present

## 2018-11-29 DIAGNOSIS — M2041 Other hammer toe(s) (acquired), right foot: Secondary | ICD-10-CM | POA: Diagnosis not present

## 2018-12-13 ENCOUNTER — Encounter (INDEPENDENT_AMBULATORY_CARE_PROVIDER_SITE_OTHER): Payer: Medicare HMO | Admitting: Ophthalmology

## 2018-12-27 NOTE — Progress Notes (Addendum)
[facility] Note  01/03/2019     CHIEF COMPLAINT Patient presents for Diabetic Eye Exam   HISTORY OF PRESENT ILLNESS: Aaron Fox is a 63 y.o. male who presents to the clinic today for:   HPI    Diabetic Eye Exam    Vision is stable.  Associated Symptoms Floaters.  Negative for Flashes, Blind Spot, Photophobia, Scalp Tenderness, Fever, Pain, Glare, Jaw Claudication, Weight Loss, Distortion, Redness, Trauma, Shoulder/Hip pain and Fatigue.  Diabetes characteristics include Type 2 and on insulin.  This started 18.  Blood sugar level is uncontrolled.  Last Blood Glucose 160 (This am).  Last A1C 10.1 (05/06/18).  Associated Diagnosis Neuropathy.  I, the attending physician,  performed the HPI with the patient and updated documentation appropriately.          Comments    Patient states referred by Dr. Parke Simmers for diabetic retinal evaluation. Went to see Dr. Parke Simmers for routine eye exam. Occasional floaters OU. No flashes. Last a1c was 10.1 in March. Has been diabetic for about 18 years. Has had CE with IOL OU with Dr. Talbert Forest several years ago. No eye gtts at this time.        Last edited by [name], MD on 01/03/2019  4:39 PM. (History)    pt states he saw Dr. Parke Simmers for routine eye exam and was sent her for possible swelling in the back of his eye, pt states he has seen Dr. Zadie Rhine in the past and received 3-4 injections in his right eye only, pt does not remember having any laser procedures with [place], pt had cataract sx with Dr. Talbert Forest, pt is diabetic and on insulin, pt is trying to lower his A1c (10.1 in March)  Referring physician: Dem[place], [place], [address] [place],  [place] [address]  HISTORICAL INFORMATION:   Selected notes from the [facility] Referred by Dr. [name] for concern of NPDR / retinal edema OD LEE: 09.24.20 (J. 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 (Ophthalmic Drugs)   No current facility-administered medications for this visit.  (Ophthalmic Drugs)   Current Outpatient Medications (Other)  Medication Sig  . Accu-Chek Softclix Lancets lancets Use to monitor glucose levels two times per day; E10.49  . Alcohol Swabs (B-D SINGLE USE SWABS REGULAR) PADS Use to cleanse site prior to use of lancet to obtain droplet of blood two times per day; E10.49  . aspirin 81 MG tablet Take 81 mg by mouth daily.    Marland Kitchen atorvastatin (LIPITOR) 10 MG tablet Take 1 tablet (10 mg total) by mouth daily.  . BD INSULIN SYRINGE ULTRAFINE 31G X 5/16" 0.5 ML MISC Use with insulin.  . Blood Glucose Calibration (ACCU-CHEK AVIVA) SOLN 1 each by In Vitro route as needed. Use solution prn to evaluate accuracy of meter  . Blood Glucose Monitoring Suppl (ACCU-CHEK AVIVA PLUS) w/Device KIT 1 each by Does not apply route 2 (two) times daily. Use to monitor glucose levels two times per day; E10.49  . gabapentin (NEURONTIN) 300 MG capsule Take 1 capsule (300 mg total) by mouth 2 (two) times daily.  Marland Kitchen glucose blood (ACCU-CHEK AVIVA PLUS) test strip Use to monitor glucose levels two times per day; E10.49  . insulin NPH Human (HUMULIN N,NOVOLIN N) 100  UNIT/ML injection Inject 0.26 mLs (26 Units total) into the skin every morning. (Patient taking differently: Inject 28 Units into the skin every morning. )  . insulin regular (NOVOLIN R RELION) 100 units/mL injection Inject 0.05 mLs (5 Units total) daily with supper into the skin. And syringes 2/day (Patient taking differently: Inject 7 Units into the skin daily with supper. And syringes 2/day)  . losartan (COZAAR) 100 MG tablet Take 1 tablet (100 mg total) by mouth daily.  Marland Kitchen MONOJECT ULTRA COMFORT SYRINGE 29G X 1/2" 1 ML MISC USE TO INJECT INSULIN 4 TIMES PER DAY   No  current facility-administered medications for this visit.  (Other)      REVIEW OF SYSTEMS: ROS    Positive for: Endocrine, Cardiovascular, Eyes   Negative for: Constitutional, Gastrointestinal, Neurological, Skin, Genitourinary, Musculoskeletal, HENT, Respiratory, Psychiatric, Allergic/Imm, Heme/Lymph   Last edited by Roselee Nova D, COT on 01/03/2019  8:53 AM. (History)       ALLERGIES Allergies  Allergen Reactions  . Penicillins Swelling    Swelling of the hands   . Terbinafine Hcl Hives  . Zestril [Lisinopril] Cough  . Ciprofloxacin Rash  . Terbinafine Hcl Hives    PAST MEDICAL HISTORY Past Medical History:  Diagnosis Date  . ALLERGIC RHINITIS 07/02/2008  . Allergy   . Arthritis    RA  . Atrial fibrillation ([place])   . Cataract    removed both eyes   . CHF 06/19/2008  . COUGH DUE TO ACE INHIBITORS 10/05/2008  . DIABETES MELLITUS, TYPE II 09/22/2006  . Diabetic retinopathy of both eyes (HCC)    mild nonproliferative  . ED (erectile dysfunction)   . Glaucoma   . GOUT 09/22/2006  . Hx of adenomatous colonic polyps 06/09/2015  . HYPERCHOLESTEROLEMIA 07/02/2008  . HYPERTENSION 05/31/2009  . HYPOGONADISM, MALE 01/15/2007  . Hypokalemia   . 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 Lymphadenopathy   . Peripheral neuropathy   . PUD (peptic ulcer disease)   . Rheumatoid arthritis(714.0) 09/22/2006   Past Surgical History:  Procedure Laterality Date  . CATARACT EXTRACTION Bilateral    Dr. Talbert Forest  . CATARACT EXTRACTION, BILATERAL    . COLONOSCOPY  2004  . LYMPH NODE BIOPSY Right 10/16/2014   Procedure: RIGHT INGUINAL LYMPH NODE BIOPSY;  Surgeon: Aviva Signs, MD;  Location: [facility];  Service: General;  Laterality: Right;    FAMILY HISTORY Family History  Problem Relation Age of Onset  . Heart disease Other        FH of CAD  . Colon polyps Paternal Uncle   . Kidney disease Paternal Uncle   . Diabetes Paternal Grandfather   . Diabetes Maternal Uncle   . Cancer Neg Hx   .  Colon cancer Neg Hx   . Gallbladder disease Neg Hx   . Esophageal cancer Neg Hx   . Rectal cancer Neg Hx   . Stomach cancer Neg Hx     SOCIAL HISTORY Social History   Tobacco Use  . Smoking status: Former Smoker    Quit date: 11/02/1999    Years since quitting: 19.1  . Smokeless tobacco: Never Used  Substance Use Topics  . Alcohol use: No    Alcohol/week: 0.0 standard drinks  . Drug use: No         OPHTHALMIC EXAM:  Base Eye Exam    Visual Acuity (Snellen - Linear)      Right Left   Dist [place] 20/40 20/25   Dist ph [place] 20/30  NI       Tonometry (Tonopen, 9:07 AM)      Right Left   Pressure 15 15       Pupils      Dark Light Shape React APD   Right 2 1 Round Slow None   Left 2 1 Round Slow None       Visual Fields (Counting fingers)      Left Right    Full Full       Extraocular Movement      Right Left    Full, Ortho Full, Ortho       Neuro/Psych    Oriented x3: Yes   Mood/Affect: Normal       Dilation    Both eyes: 1.0% Mydriacyl, 2.5% Phenylephrine @ 9:07 AM        Slit Lamp and Fundus Exam    Slit Lamp Exam      Right Left   Lids/Lashes Dermatochalasis - upper lid, mild Meibomian gland dysfunction Dermatochalasis - upper lid, mild Meibomian gland dysfunction   Conjunctiva/Sclera Nasal Pinguecula, Melanosis Nasal Pinguecula, Melanosis   Cornea Arcus, well healed temporal cataract wound, mild endo pigment Arcus, well healed temporal cataract wound, 2+ Punctate epithelial erosions   Anterior Chamber Deep and quiet Deep and quiet   Iris Round and dilated, No NVI Round and dilated, No NVI   Lens 3 piece PC IOL in good position, ?in sulcus PC IOL in good position   Vitreous Mild Vitreous syneresis Vitreous syneresis       Fundus Exam      Right Left   Disc Pink and Sharp Pink and Sharp, mild tilt, temporal PPP   C/D Ratio 0.55 0.6   Macula Blunted foveal reflex, +edema, scattered Microaneurysms, mild Exudates Flat, good foveal reflex, scattered  Microaneurysms, focal laser scars temporally   Vessels Vascular attenuation, Tortuous Vascular attenuation, Tortuous   Periphery Attached, 360 MA Attached, 360 MA        Refraction    Manifest Refraction      Sphere Cylinder Axis Dist VA   Right -1.00 +1.00 065 20/25-2   Left -0.75 +0.75 150 20/20-1          IMAGING AND PROCEDURES  Imaging and Procedures for '@TODAY'$ @  OCT, Retina - OU - Both Eyes       Right Eye Quality was good. Central Foveal Thickness: 280. Progression has no prior data. Findings include intraretinal fluid, no SRF, intraretinal hyper-reflective material, abnormal foveal contour (+DME greatest ST to fovea).   Left Eye Quality was good. Central Foveal Thickness: 262. Progression has no prior data. Findings include normal foveal contour, no IRF, no SRF (partial PVD; focal patches of ORA temporal macula, ?focal laser scars).   Notes *Images captured and stored on drive  Diagnosis / Impression:  OD: +DME greatest ST to fovea OS: NFP, no IRF/SRF; partial PVD; focal patches of ORA temporal macula, ?focal laser scars  Clinical management:  See below  Abbreviations: NFP - Normal foveal profile. CME - cystoid macular edema. PED - pigment epithelial detachment. IRF - intraretinal fluid. SRF - subretinal fluid. EZ - ellipsoid zone. ERM - epiretinal membrane. ORA - outer retinal atrophy. ORT - outer retinal tubulation. SRHM - subretinal hyper-reflective material        Intravitreal Injection, Pharmacologic Agent - OD - Right Eye       Time Out 01/03/2019. 10:15 AM. Confirmed correct patient, procedure, site, and patient consented.   Anesthesia Topical anesthesia was used.  Anesthetic medications included Lidocaine 2%, Proparacaine 0.5%.   Procedure Preparation included 5% betadine to ocular surface, eyelid speculum. A 30 gauge needle was used.   Injection:  1.25 mg Bevacizumab (AVASTIN) SOLN   NDC: 17616-073-71, Lot: [id number]'@13'$ , Expiration date:  03/12/2019   Route: Intravitreal, Site: Right Eye, Waste: 0 mg  Post-op Post injection exam found visual acuity of at least counting fingers. The patient tolerated the procedure well. There were no complications. The patient received written and verbal post procedure care education.                 ASSESSMENT/PLAN:    ICD-10-CM   1. Moderate nonproliferative diabetic retinopathy of both eyes with macular edema associated with type 2 diabetes mellitus (HCC)  03/25/2019 Intravitreal Injection, Pharmacologic Agent - OD - Right Eye    Bevacizumab (AVASTIN) SOLN 1.25 mg  2. Retinal edema  H35.81 OCT, Retina - OU - Both Eyes  3. Essential hypertension  I10   4. Hypertensive retinopathy of both eyes  H35.033   5. Pseudophakia of both eyes  Z96.1     1,2. Moderate Non-proliferative diabetic retinopathy with DME, OU (OD>OS)  - OD: s/p injections with Dr. [id number]  - OS: s/p focal laser   - The incidence, risk factors for progression, natural history and treatment options for diabetic retinopathy  were discussed with patient.    - The need for close monitoring of blood glucose, blood pressure, and serum lipids, avoiding cigarette or any type of tobacco, and the need for long term follow up was also discussed with patient.  - exam shows scattered MA OU; no frank NV  - OCT shows diabetic macular edema OD  - The natural history, pathology, and characteristics of diabetic macular edema discussed with patient.  A generalized discussion of the major clinical trials concerning treatment of diabetic macular edema (ETDRS, DCT, SCORE, RISE / RIDE, and ongoing DRCR net studies) was completed.  This discussion included mention of the various approaches to treating diabetic macular edema (observation, laser photocoagulation, anti-VEGF injections with lucentis / Avastin / Eylea, steroid injections with Kenalog / Ozurdex, and intraocular surgery with vitrectomy).  The goal hemoglobin A1C of 6-7 was discussed, as  well as importance of smoking cessation and hypertension control.  Need for ongoing treatment and monitoring were specifically discussed with reference to chronic nature of diabetic macular edema.  - recommend IVA #1 OD today, 11.06.20  - pt wishes to proceed  - RBA of procedure discussed, questions answered  - informed consent obtained and signed  - see procedure note  - will try to obtain records from Dr. 24.06.20  - f/u in 4 wks, OCT, DFE, FA (optos, transit OD)  3,4. Hypertensive retinopathy OU  - discussed importance of tight BP control  - monitor  5. Pseudophakia OU  - s/p CE/IOL OU (Dr. Zadie Rhine)  - beautiful surgeries, doing well  - monitor   Ophthalmic Meds Ordered this visit:  Meds ordered this encounter  Medications  . Bevacizumab (AVASTIN) SOLN 1.25 mg       Return in about 4 weeks (around 01/31/2019) for f/u NPDR OU, DFE, OCT.  There are no Patient Instructions on file for this visit.   Explained the diagnoses, plan, and follow up with the patient and they expressed understanding.  Patient expressed understanding of the importance of proper follow up care.   This document serves as a record of services personally performed by 25/05/2018, MD, PhD. It was created on  their behalf by Ernest Mallick, OA, an ophthalmic assistant. The creation of this record is the provider's dictation and/or activities during the visit.    Electronically signed by: Ernest Mallick, OA 10.30.2020 4:42 PM   Gardiner Sleeper, M.D., Ph.D. Diseases & Surgery of the Retina and Vitreous [facility]  I have reviewed the above documentation for accuracy and completeness, and I agree with the above. Gardiner Sleeper, M.D., Ph.D. 01/03/19 4:42 PM     Abbreviations: M myopia (nearsighted); A astigmatism; H hyperopia (farsighted); P presbyopia; Mrx spectacle prescription;  CTL contact lenses; OD right eye; OS left eye; OU both eyes  XT exotropia; ET esotropia; PEK punctate  epithelial keratitis; PEE punctate epithelial erosions; DES dry eye syndrome; MGD meibomian gland dysfunction; ATs artificial tears; PFAT's preservative free artificial tears; [place] nuclear sclerotic cataract; PSC posterior subcapsular cataract; ERM epi-retinal membrane; PVD posterior vitreous detachment; RD retinal detachment; DM diabetes mellitus; DR diabetic retinopathy; NPDR non-proliferative diabetic retinopathy; PDR proliferative diabetic retinopathy; CSME clinically significant macular edema; DME diabetic macular edema; dbh dot blot hemorrhages; CWS cotton wool spot; POAG primary open angle glaucoma; C/D cup-to-disc ratio; HVF humphrey visual field; GVF goldmann visual field; OCT optical coherence tomography; IOP intraocular pressure; BRVO Branch retinal vein occlusion; CRVO central retinal vein occlusion; CRAO central retinal artery occlusion; BRAO branch retinal artery occlusion; RT retinal tear; SB scleral buckle; PPV pars plana vitrectomy; VH Vitreous hemorrhage; PRP panretinal laser photocoagulation; IVK intravitreal kenalog; VMT vitreomacular traction; MH Macular hole;  NVD neovascularization of the disc; NVE neovascularization elsewhere; AREDS age related eye disease study; ARMD age related macular degeneration; POAG primary open angle glaucoma; EBMD epithelial/anterior basement membrane dystrophy; ACIOL anterior chamber intraocular lens; IOL intraocular lens; PCIOL posterior chamber intraocular lens; Phaco/IOL phacoemulsification with intraocular lens placement; [place] photorefractive keratectomy; LASIK laser assisted in situ keratomileusis; HTN hypertension; DM diabetes mellitus; COPD chronic obstructive pulmonary disease

## 2019-01-03 ENCOUNTER — Encounter (INDEPENDENT_AMBULATORY_CARE_PROVIDER_SITE_OTHER): Payer: Self-pay | Admitting: Ophthalmology

## 2019-01-03 ENCOUNTER — Other Ambulatory Visit: Payer: Self-pay

## 2019-01-03 ENCOUNTER — Ambulatory Visit (INDEPENDENT_AMBULATORY_CARE_PROVIDER_SITE_OTHER): Payer: Medicare HMO | Admitting: Ophthalmology

## 2019-01-03 DIAGNOSIS — H3581 Retinal edema: Secondary | ICD-10-CM | POA: Diagnosis not present

## 2019-01-03 DIAGNOSIS — I509 Heart failure, unspecified: Secondary | ICD-10-CM | POA: Diagnosis not present

## 2019-01-03 DIAGNOSIS — H35033 Hypertensive retinopathy, bilateral: Secondary | ICD-10-CM | POA: Diagnosis not present

## 2019-01-03 DIAGNOSIS — Z961 Presence of intraocular lens: Secondary | ICD-10-CM

## 2019-01-03 DIAGNOSIS — E113313 Type 2 diabetes mellitus with moderate nonproliferative diabetic retinopathy with macular edema, bilateral: Secondary | ICD-10-CM

## 2019-01-03 DIAGNOSIS — I110 Hypertensive heart disease with heart failure: Secondary | ICD-10-CM | POA: Diagnosis not present

## 2019-01-03 DIAGNOSIS — I10 Essential (primary) hypertension: Secondary | ICD-10-CM

## 2019-01-03 MED ORDER — BEVACIZUMAB CHEMO INJECTION 1.25MG/0.05ML SYRINGE FOR KALEIDOSCOPE
1.2500 mg | INTRAVITREAL | Status: AC | PRN
  Administered 2019-01-03: 17:00:00 1.25 mg via INTRAVITREAL

## 2019-01-15 ENCOUNTER — Other Ambulatory Visit: Payer: Self-pay | Admitting: Endocrinology

## 2019-01-17 ENCOUNTER — Ambulatory Visit: Payer: Medicare HMO | Admitting: Podiatry

## 2019-01-17 ENCOUNTER — Other Ambulatory Visit: Payer: Self-pay | Admitting: Endocrinology

## 2019-01-17 NOTE — Telephone Encounter (Signed)
Per Dr. Ellison's request, I am forwarding this refill request. Please review and refill if appropriate.  

## 2019-01-17 NOTE — Telephone Encounter (Signed)
Please advise 

## 2019-01-17 NOTE — Telephone Encounter (Signed)
Please forward refill request to pt's primary care provider.   

## 2019-01-20 ENCOUNTER — Ambulatory Visit: Payer: Medicare HMO | Admitting: Podiatry

## 2019-01-20 ENCOUNTER — Other Ambulatory Visit: Payer: Self-pay

## 2019-01-20 DIAGNOSIS — L84 Corns and callosities: Secondary | ICD-10-CM

## 2019-01-20 DIAGNOSIS — E1149 Type 2 diabetes mellitus with other diabetic neurological complication: Secondary | ICD-10-CM | POA: Diagnosis not present

## 2019-01-20 DIAGNOSIS — B351 Tinea unguium: Secondary | ICD-10-CM | POA: Diagnosis not present

## 2019-01-20 DIAGNOSIS — M79675 Pain in left toe(s): Secondary | ICD-10-CM | POA: Diagnosis not present

## 2019-01-20 DIAGNOSIS — M79674 Pain in right toe(s): Secondary | ICD-10-CM | POA: Diagnosis not present

## 2019-01-20 NOTE — Progress Notes (Signed)
Subjective: 63 y.o. returns the office today for painful, elongated, thickened toenails which he cannot trim himselt. Denies any redness or drainage around the nails. Denies any acute changes since last appointment and no new complaints today. Denies any systemic complaints such as fevers, chills, nausea, vomiting.   PCP: [name], MD A1c: 10.1  Objective: AAO 3, NAD DP/PT pulses palpable, CRT less than 3 seconds Protective sensation decreased with Simms Weinstein monofilament, Achilles tendon reflex intact.  Nails hypertrophic, dystrophic, elongated, brittle, discolored 8. There is tenderness overlying the nails 1-5 on the left and 2, 4, 5 on the right.  Small hyperkeratotic lesion at the distal lateral aspect of the right fourth toe.  No ongoing ulceration drainage or signs of infection.  There is no surrounding erythema or drainage along the nail sites. No open lesions or pre-ulcerative lesions are identified. No other areas of tenderness bilateral lower extremities. No overlying edema, erythema, increased warmth. No pain with calf compression, swelling, warmth, erythema.  Assessment: Patient presents with symptomatic onychomycosis; hyperkeratotic lesion  Plan: -Treatment options including alternatives, risks, complications were discussed -Nails sharply debrided 8 without complication/bleeding. -Hyperkeratotic lesion sharply debrided x1 without any complications or bleeding. -Discussed daily foot inspection. If there are any changes, to call the office immediately.  -Follow-up in 3 months or sooner if any problems are to arise. In the meantime, encouraged to call the office with any questions, concerns, changes symptoms.  Celesta Gentile, DPM

## 2019-01-30 NOTE — Progress Notes (Addendum)
[facility] Note  01/31/2019     CHIEF COMPLAINT Patient presents for Retina Follow Up   HISTORY OF PRESENT ILLNESS: Aaron Fox is a 63 y.o. male who presents to the clinic today for:   HPI    Retina Follow Up    Patient presents with  Diabetic Retinopathy.  In both eyes.  Severity is moderate.  Duration of 4 weeks.  I, the attending physician,  performed the HPI with the patient and updated documentation appropriately.          Comments    Patient states vision the same OU. BS was 165 this am. Last a1c was 10.1, checked in March 2020.        Last edited by [name], MD on 01/31/2019  8:05 AM. (History)    pt states he saw Dr. Parke Simmers for routine eye exam and was sent her for possible swelling in the back of his eye, pt states he has seen Dr. Zadie Rhine in the past and received 3-4 injections in his right eye only, pt does not remember having any laser procedures with [facility], pt had cataract sx with Dr. Talbert Forest, pt is diabetic and on insulin, pt is trying to lower his A1c (10.1 in March)  Referring physician: [name], MD [address] [place],  [name] [address]  HISTORICAL INFORMATION:   Selected notes from the MEDICAL RECORD NUMBER Referred by Dr. [name] for concern of NPDR / retinal edema OD LEE: 09.24.20 (J. DeMarco) [BCVA: OD: 20/40-- OS: 20/30-] Ocular Hx-glaucoma suspect, pseudo, NPDR, retinal edema OD, ERM OD, HTN ret PMH-afib, DM (takes Novolin, last A1c: 10.1 on 03.09.20), HLD, HTN, RA   CURRENT MEDICATIONS: No current outpatient medications on file. (Ophthalmic Drugs)   No current facility-administered medications for this visit.  (Ophthalmic Drugs)   Current Outpatient Medications (Other)  Medication Sig  . Accu-Chek Softclix Lancets lancets Use to monitor glucose levels two times per day; E10.49  . Alcohol Swabs (B-D SINGLE USE SWABS REGULAR) PADS Use to cleanse site prior to use of lancet to obtain  droplet of blood two times per day; E10.49  . aspirin 81 MG tablet Take 81 mg by mouth daily.    Marland Kitchen atorvastatin (LIPITOR) 10 MG tablet Take 1 tablet (10 mg total) by mouth daily.  . BD INSULIN SYRINGE ULTRAFINE 31G X 5/16" 0.5 ML MISC Use with insulin.  . Blood Glucose Calibration (ACCU-CHEK AVIVA) SOLN 1 each by In Vitro route as needed. Use solution prn to evaluate accuracy of meter  . Blood Glucose Monitoring Suppl (ACCU-CHEK AVIVA PLUS) w/Device KIT 1 each by Does not apply route 2 (two) times daily. Use to monitor glucose levels two times per day; E10.49  . gabapentin (NEURONTIN) 300 MG capsule TAKE 1 CAPSULE TWICE DAILY  . glucose blood (ACCU-CHEK AVIVA PLUS) test strip Use to monitor glucose levels two times per day; E10.49  . insulin NPH Human (HUMULIN N,NOVOLIN N) 100 UNIT/ML injection Inject 0.26 mLs (26 Units total) into the skin every morning. (Patient taking differently: Inject 28 Units into the skin every morning. )  . insulin regular (NOVOLIN R RELION) 100 units/mL injection Inject 0.05 mLs (5 Units total) daily with supper into the skin. And syringes 2/day (Patient taking differently: Inject 7 Units into the skin daily with supper. And syringes 2/day)  . losartan (COZAAR) 100 MG tablet Take 1 tablet (100 mg total) by mouth daily.  Marland Kitchen MONOJECT ULTRA  COMFORT SYRINGE 29G X 1/2" 1 ML MISC USE TO INJECT INSULIN 4 TIMES PER DAY   No current facility-administered medications for this visit.  (Other)      REVIEW OF SYSTEMS: ROS    Positive for: Endocrine, Cardiovascular, Eyes   Negative for: Constitutional, Gastrointestinal, Neurological, Skin, Genitourinary, Musculoskeletal, HENT, Respiratory, Psychiatric, Allergic/Imm, Heme/Lymph   Last edited by Roselee Nova D, COT on 01/31/2019  7:42 AM. (History)       ALLERGIES Allergies  Allergen Reactions  . Penicillins Swelling    Swelling of the hands   . Terbinafine Hcl Hives  . Zestril [Lisinopril] Cough  . Ciprofloxacin Rash  .  Terbinafine Hcl Hives    PAST MEDICAL HISTORY Past Medical History:  Diagnosis Date  . ALLERGIC RHINITIS 07/02/2008  . Allergy   . Arthritis    RA  . Atrial fibrillation ([place])   . Cataract    removed both eyes   . CHF 06/19/2008  . COUGH DUE TO ACE INHIBITORS 10/05/2008  . DIABETES MELLITUS, TYPE II 09/22/2006  . Diabetic retinopathy of both eyes (HCC)    mild nonproliferative  . ED (erectile dysfunction)   . Glaucoma   . GOUT 09/22/2006  . Hx of adenomatous colonic polyps 06/09/2015  . HYPERCHOLESTEROLEMIA 07/02/2008  . HYPERTENSION 05/31/2009  . HYPOGONADISM, MALE 01/15/2007  . Hypokalemia   . 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 Lymphadenopathy   . Peripheral neuropathy   . PUD (peptic ulcer disease)   . Rheumatoid arthritis(714.0) 09/22/2006   Past Surgical History:  Procedure Laterality Date  . CATARACT EXTRACTION Bilateral    Dr. Talbert Forest  . CATARACT EXTRACTION, BILATERAL    . COLONOSCOPY  2004  . LYMPH NODE BIOPSY Right 10/16/2014   Procedure: RIGHT INGUINAL LYMPH NODE BIOPSY;  Surgeon: Aviva Signs, MD;  Location: [facility];  Service: General;  Laterality: Right;    FAMILY HISTORY Family History  Problem Relation Age of Onset  . Heart disease Other        FH of CAD  . Colon polyps Paternal Uncle   . Kidney disease Paternal Uncle   . Diabetes Paternal Grandfather   . Diabetes Maternal Uncle   . Cancer Neg Hx   . Colon cancer Neg Hx   . Gallbladder disease Neg Hx   . Esophageal cancer Neg Hx   . Rectal cancer Neg Hx   . Stomach cancer Neg Hx     SOCIAL HISTORY Social History   Tobacco Use  . Smoking status: Former Smoker    Quit date: 11/02/1999    Years since quitting: 19.2  . Smokeless tobacco: Never Used  Substance Use Topics  . Alcohol use: No    Alcohol/week: 0.0 standard drinks  . Drug use: No         OPHTHALMIC EXAM:  Base Eye Exam    Visual Acuity (Snellen - Linear)      Right Left   Dist [place] 20/40 +2 20/30   Dist ph [place] 20/30 +1 20/25    Correction: Glasses       Tonometry (Tonopen, 7:53 AM)      Right Left   Pressure 17 17       Pupils      Dark Light Shape React APD   Right 2 1 Round Slow None   Left 3 2 Round Slow None       Visual Fields (Counting fingers)      Left Right    Full Full  Extraocular Movement      Right Left    Full, Ortho Full, Ortho       Neuro/Psych    Oriented x3: Yes   Mood/Affect: Normal       Dilation    Both eyes: 1.0% Mydriacyl, 2.5% Phenylephrine @ 7:53 AM        Slit Lamp and Fundus Exam    Slit Lamp Exam      Right Left   Lids/Lashes Dermatochalasis - upper lid, mild Meibomian gland dysfunction Dermatochalasis - upper lid, mild Meibomian gland dysfunction   Conjunctiva/Sclera Nasal Pinguecula, Melanosis Nasal Pinguecula, Melanosis   Cornea Arcus, well healed temporal cataract wound, mild endo pigment Arcus, well healed temporal cataract wound, 2+ Punctate epithelial erosions   Anterior Chamber Deep and quiet Deep and quiet   Iris Round and dilated, No NVI Round and dilated, No NVI   Lens 3 piece PC IOL in good position, ?in sulcus PC IOL in good position   Vitreous Mild Vitreous syneresis Vitreous syneresis       Fundus Exam      Right Left   Disc Pink and Sharp Pink and Sharp, mild tilt, temporal PPP   C/D Ratio 0.55 0.6   Macula Blunted foveal reflex, +edema, scattered Microaneurysms, mild Exudates Flat, good foveal reflex, scattered Microaneurysms, focal laser scars temporally   Vessels Vascular attenuation, Tortuous Vascular attenuation, Tortuous   Periphery Attached, 360 MA Attached, 360 MA        Refraction    Manifest Refraction      Dist VA   Right 20/30   Left 20/25-1          IMAGING AND PROCEDURES  Imaging and Procedures for @TODAY @  OCT, Retina - OU - Both Eyes       Right Eye Quality was good. Central Foveal Thickness: 266. Progression has improved. Findings include intraretinal fluid, no SRF, intraretinal hyper-reflective  material, normal foveal contour (Interval improvement in IRF/DME ST to fovea).   Left Eye Quality was good. Central Foveal Thickness: 264. Progression has been stable. Findings include normal foveal contour, no IRF, no SRF (partial PVD; focal patches of ORA temporal macula, ?focal laser scars).   Notes *Images captured and stored on drive  Diagnosis / Impression:  OD: interval improvement in IRF/DME ST to fovea OS: NFP, no IRF/SRF; partial PVD; focal patches of ORA temporal macula, ?focal laser scars  Clinical management:  See below  Abbreviations: NFP - Normal foveal profile. CME - cystoid macular edema. PED - pigment epithelial detachment. IRF - intraretinal fluid. SRF - subretinal fluid. EZ - ellipsoid zone. ERM - epiretinal membrane. ORA - outer retinal atrophy. ORT - outer retinal tubulation. SRHM - subretinal hyper-reflective material        Intravitreal Injection, Pharmacologic Agent - OD - Right Eye       Time Out 01/31/2019. 7:56 AM. Confirmed correct patient, procedure, site, and patient consented.   Anesthesia Topical anesthesia was used. Anesthetic medications included Lidocaine 2%, Proparacaine 0.5%.   Procedure Preparation included 5% betadine to ocular surface, eyelid speculum. A supplied needle was used.   Injection:  1.25 mg Bevacizumab (AVASTIN) SOLN   NDC: 62035-597-41, Lot: [id number] , Expiration date: 03/05/2019   Route: Intravitreal, Site: Right Eye, Waste: 0 mL  Post-op Post injection exam found visual acuity of at least counting fingers. The patient tolerated the procedure well. There were no complications. The patient received written and verbal post procedure care education.  Fluorescein Angiography Optos (Transit OD)       Right Eye   Progression has no prior data. Early phase findings include delayed filling, microaneurysm. Mid/Late phase findings include microaneurysm, leakage (No NV).   Left Eye   Progression has no prior data.  Early phase findings include staining, microaneurysm. Mid/Late phase findings include staining, leakage, microaneurysm (staining of focal laser temporal macula; no NV).   Notes **Images stored on drive**  Impression: Moderate NPDR OU No NV OU Late leaking MA's OU                 ASSESSMENT/PLAN:    ICD-10-CM   1. Moderate nonproliferative diabetic retinopathy of both eyes with macular edema associated with type 2 diabetes mellitus (HCC)  M41.5830 Intravitreal Injection, Pharmacologic Agent - OD - Right Eye    Fluorescein Angiography Optos (Transit OD)    Bevacizumab (AVASTIN) SOLN 1.25 mg  2. Retinal edema  H35.81 OCT, Retina - OU - Both Eyes  3. Essential hypertension  I10   4. Hypertensive retinopathy of both eyes  H35.033 Fluorescein Angiography Optos (Transit OD)  5. Pseudophakia of both eyes  Z96.1     1,2. Moderate Non-proliferative diabetic retinopathy with DME, OU (OD>OS)  - OD: s/p injections with Dr. Zadie Rhine  - OS: s/p focal laser temporal macua  - s/p IVA OD #1 (11.06.20)  - exam shows scattered MA OU; no frank NV  - BCVA 20/30+2 -- slightly improved  - OCT shows interval improvement diabetic macular edema OD  - FA 12.4.2020 shows late leaking MA OU; no NVE OU  - recommend IVA #2 OD today, 12.04.20  - pt wishes to proceed  - RBA of procedure discussed, questions answered  - informed consent obtained and signed  - see procedure note  - will try to obtain records from Dr. Zadie Rhine  - f/u in 4 wks, OCT, DFE  3,4. Hypertensive retinopathy OU  - discussed importance of tight BP control  - monitor  5. Pseudophakia OU  - s/p CE/IOL OU (Dr. Talbert Forest)  - beautiful surgeries, doing well  - monitor   Ophthalmic Meds Ordered this visit:  Meds ordered this encounter  Medications  . Bevacizumab (AVASTIN) SOLN 1.25 mg       Return in about 4 weeks (around 02/28/2019) for Dilated Exam, OCT, Possible Injxn.  There are no Patient Instructions on file for this  visit.   Explained the diagnoses, plan, and follow up with the patient and they expressed understanding.  Patient expressed understanding of the importance of proper follow up care.   This document serves as a record of services personally performed by Gardiner Sleeper, MD, PhD. It was created on their behalf by Ernest Mallick, OA, an ophthalmic assistant. The creation of this record is the provider's dictation and/or activities during the visit.    Electronically signed by: Ernest Mallick, OA 12.03.2020 12:26 AM   Gardiner Sleeper, M.D., Ph.D. Diseases & Surgery of the Retina and Vitreous [facility]   I have reviewed the above documentation for accuracy and completeness, and I agree with the above. Gardiner Sleeper, M.D., Ph.D. 02/01/19 12:26 AM     Abbreviations: M myopia (nearsighted); A astigmatism; H hyperopia (farsighted); P presbyopia; Mrx spectacle prescription;  CTL contact lenses; OD right eye; OS left eye; OU both eyes  XT exotropia; ET esotropia; PEK punctate epithelial keratitis; PEE punctate epithelial erosions; DES dry eye syndrome; MGD meibomian gland dysfunction; ATs artificial tears; PFAT's  preservative free artificial tears; [place] nuclear sclerotic cataract; PSC posterior subcapsular cataract; ERM epi-retinal membrane; PVD posterior vitreous detachment; RD retinal detachment; DM diabetes mellitus; DR diabetic retinopathy; NPDR non-proliferative diabetic retinopathy; PDR proliferative diabetic retinopathy; CSME clinically significant macular edema; DME diabetic macular edema; dbh dot blot hemorrhages; CWS cotton wool spot; POAG primary open angle glaucoma; C/D cup-to-disc ratio; HVF humphrey visual field; GVF goldmann visual field; OCT optical coherence tomography; IOP intraocular pressure; BRVO Branch retinal vein occlusion; CRVO central retinal vein occlusion; CRAO central retinal artery occlusion; BRAO branch retinal artery occlusion; RT retinal tear; SB  scleral buckle; PPV pars plana vitrectomy; VH Vitreous hemorrhage; PRP panretinal laser photocoagulation; IVK intravitreal kenalog; VMT vitreomacular traction; MH Macular hole;  NVD neovascularization of the disc; NVE neovascularization elsewhere; AREDS age related eye disease study; ARMD age related macular degeneration; POAG primary open angle glaucoma; EBMD epithelial/anterior basement membrane dystrophy; ACIOL anterior chamber intraocular lens; IOL intraocular lens; PCIOL posterior chamber intraocular lens; Phaco/IOL phacoemulsification with intraocular lens placement; [place] photorefractive keratectomy; LASIK laser assisted in situ keratomileusis; HTN hypertension; DM diabetes mellitus; COPD chronic obstructive pulmonary disease

## 2019-01-31 ENCOUNTER — Encounter (INDEPENDENT_AMBULATORY_CARE_PROVIDER_SITE_OTHER): Payer: Self-pay | Admitting: Ophthalmology

## 2019-01-31 ENCOUNTER — Ambulatory Visit (INDEPENDENT_AMBULATORY_CARE_PROVIDER_SITE_OTHER): Payer: Medicare HMO | Admitting: Ophthalmology

## 2019-01-31 DIAGNOSIS — H35033 Hypertensive retinopathy, bilateral: Secondary | ICD-10-CM

## 2019-01-31 DIAGNOSIS — H3581 Retinal edema: Secondary | ICD-10-CM

## 2019-01-31 DIAGNOSIS — I10 Essential (primary) hypertension: Secondary | ICD-10-CM | POA: Diagnosis not present

## 2019-01-31 DIAGNOSIS — Z961 Presence of intraocular lens: Secondary | ICD-10-CM | POA: Diagnosis not present

## 2019-01-31 DIAGNOSIS — E113313 Type 2 diabetes mellitus with moderate nonproliferative diabetic retinopathy with macular edema, bilateral: Secondary | ICD-10-CM

## 2019-02-01 MED ORDER — BEVACIZUMAB CHEMO INJECTION 1.25MG/0.05ML SYRINGE FOR KALEIDOSCOPE
1.2500 mg | INTRAVITREAL | Status: AC | PRN
  Administered 2019-02-01: 1.25 mg via INTRAVITREAL

## 2019-03-07 ENCOUNTER — Encounter (INDEPENDENT_AMBULATORY_CARE_PROVIDER_SITE_OTHER): Payer: Medicare HMO | Admitting: Ophthalmology

## 2019-03-12 ENCOUNTER — Encounter (INDEPENDENT_AMBULATORY_CARE_PROVIDER_SITE_OTHER): Payer: Medicare HMO | Admitting: Ophthalmology

## 2019-03-21 ENCOUNTER — Encounter (INDEPENDENT_AMBULATORY_CARE_PROVIDER_SITE_OTHER): Payer: Self-pay | Admitting: Ophthalmology

## 2019-03-21 ENCOUNTER — Ambulatory Visit (INDEPENDENT_AMBULATORY_CARE_PROVIDER_SITE_OTHER): Payer: Medicare HMO | Admitting: Ophthalmology

## 2019-03-21 ENCOUNTER — Other Ambulatory Visit: Payer: Self-pay

## 2019-03-21 DIAGNOSIS — E113313 Type 2 diabetes mellitus with moderate nonproliferative diabetic retinopathy with macular edema, bilateral: Secondary | ICD-10-CM | POA: Diagnosis not present

## 2019-03-21 DIAGNOSIS — H3581 Retinal edema: Secondary | ICD-10-CM | POA: Diagnosis not present

## 2019-03-21 DIAGNOSIS — H35033 Hypertensive retinopathy, bilateral: Secondary | ICD-10-CM

## 2019-03-21 DIAGNOSIS — I10 Essential (primary) hypertension: Secondary | ICD-10-CM

## 2019-03-21 DIAGNOSIS — Z961 Presence of intraocular lens: Secondary | ICD-10-CM

## 2019-03-21 LAB — HM DIABETES EYE EXAM

## 2019-03-21 MED ORDER — BEVACIZUMAB CHEMO INJECTION 1.25MG/0.05ML SYRINGE FOR KALEIDOSCOPE
1.2500 mg | INTRAVITREAL | Status: AC | PRN
  Administered 2019-03-21: 1.25 mg via INTRAVITREAL

## 2019-03-21 NOTE — Progress Notes (Signed)
[facility] Note  03/21/2019     CHIEF COMPLAINT Patient presents for Retina Follow Up   HISTORY OF PRESENT ILLNESS: [facility] is a 64 y.o. male who [facility] to [facility] today for:   HPI    Retina Follow Up    Patient presents with  Diabetic Retinopathy.  In both eyes.  This started weeks ago.  Severity is moderate.  Duration of weeks.  Since onset it is stable.  I, the attending physician,  performed the HPI with the patient and updated documentation appropriately.          Comments    BS this AM: 161 HgA1c: 10.1 (states he hasnt been seen in about a year)  Pt states vision is stable in both eyes.  Patient denies eye pain or discomfort and denies any new or worsening floaters or fol OU.       Last edited by [name], MD on 03/21/2019  8:08 AM. (History)    Patient is delayed f/u--7 weeks instead of 4 weeks. States vision seems about the same OU.      Referring physician: [name], MD [address] [place],  [name] [address]  HISTORICAL INFORMATION:   Selected notes from the MEDICAL RECORD NUMBER Referred by Dr. [name] for concern of NPDR / retinal edema OD LEE: 09.24.20 (J. DeMarco) [BCVA: OD: 20/40-- OS: 20/30-] Ocular Hx-glaucoma suspect, pseudo, NPDR, retinal edema OD, ERM OD, HTN ret PMH-afib, DM (takes Novolin, last A1c: 10.1 on 03.09.20), HLD, HTN, RA   CURRENT MEDICATIONS: No current outpatient medications on file. (Ophthalmic Drugs)   No current facility-administered medications for this visit. (Ophthalmic Drugs)   Current Outpatient Medications (Other)  Medication Sig  . Accu-Chek Softclix Lancets lancets Use to monitor glucose levels two times per day; E10.49  . Alcohol Swabs (B-D SINGLE USE SWABS REGULAR) PADS Use to cleanse site prior to use of lancet to obtain droplet of blood two times per day; E10.49  . aspirin 81 MG tablet Take 81 mg by mouth daily.    Marland Kitchen atorvastatin (LIPITOR) 10  MG tablet Take 1 tablet (10 mg total) by mouth daily.  . BD INSULIN SYRINGE ULTRAFINE 31G X 5/16" 0.5 ML MISC Use with insulin.  . Blood Glucose Calibration (ACCU-CHEK AVIVA) SOLN 1 each by In Vitro route as needed. Use solution prn to evaluate accuracy of meter  . Blood Glucose Monitoring Suppl (ACCU-CHEK AVIVA PLUS) w/Device KIT 1 each by Does not apply route 2 (two) times daily. Use to monitor glucose levels two times per day; E10.49  . gabapentin (NEURONTIN) 300 MG capsule TAKE 1 CAPSULE TWICE DAILY  . glucose blood (ACCU-CHEK AVIVA PLUS) test strip Use to monitor glucose levels two times per day; E10.49  . insulin NPH Human (HUMULIN N,NOVOLIN N) 100 UNIT/ML injection Inject 0.26 mLs (26 Units total) into the skin every morning. (Patient taking differently: Inject 28 Units into the skin every morning. )  . insulin regular (NOVOLIN R RELION) 100 units/mL injection Inject 0.05 mLs (5 Units total) daily with supper into the skin. And syringes 2/day (Patient taking differently: Inject 7 Units into the skin daily with supper. And syringes 2/day)  . losartan (COZAAR) 100 MG tablet Take 1 tablet (100 mg total) by mouth daily.  Marland Kitchen MONOJECT ULTRA COMFORT SYRINGE 29G X 1/2" 1 ML MISC USE TO INJECT INSULIN 4 TIMES PER DAY   No current facility-administered medications for this visit. (  Other)      REVIEW OF SYSTEMS: ROS    Positive for: Endocrine, Cardiovascular, Eyes   Negative for: Constitutional, Gastrointestinal, Neurological, Skin, Genitourinary, Musculoskeletal, HENT, Respiratory, Psychiatric, Allergic/Imm, Heme/Lymph   Last edited by Doneen Poisson on 03/21/2019  7:41 AM. (History)       ALLERGIES Allergies  Allergen Reactions  . Penicillins Swelling    Swelling of the hands   . Terbinafine Hcl Hives  . Zestril [Lisinopril] Cough  . Ciprofloxacin Rash  . Terbinafine Hcl Hives    PAST MEDICAL HISTORY Past Medical History:  Diagnosis Date  . ALLERGIC RHINITIS 07/02/2008  . Allergy    . Arthritis    RA  . Atrial fibrillation ([place])   . Cataract    removed both eyes   . CHF 06/19/2008  . COUGH DUE TO ACE INHIBITORS 10/05/2008  . DIABETES MELLITUS, TYPE II 09/22/2006  . Diabetic retinopathy of both eyes (HCC)    mild nonproliferative  . ED (erectile dysfunction)   . Glaucoma   . GOUT 09/22/2006  . Hx of adenomatous colonic polyps 06/09/2015  . HYPERCHOLESTEROLEMIA 07/02/2008  . HYPERTENSION 05/31/2009  . HYPOGONADISM, MALE 01/15/2007  . Hypokalemia   . 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 Lymphadenopathy   . Peripheral neuropathy   . PUD (peptic ulcer disease)   . Rheumatoid arthritis(714.0) 09/22/2006   Past Surgical History:  Procedure Laterality Date  . CATARACT EXTRACTION Bilateral    Dr. Talbert Forest  . CATARACT EXTRACTION, BILATERAL    . COLONOSCOPY  2004  . LYMPH NODE BIOPSY Right 10/16/2014   Procedure: RIGHT INGUINAL LYMPH NODE BIOPSY;  Surgeon: Aviva Signs, MD;  Location: [facility];  Service: General;  Laterality: Right;    FAMILY HISTORY Family History  Problem Relation Age of Onset  . Heart disease Other        FH of CAD  . Colon polyps Paternal Uncle   . Kidney disease Paternal Uncle   . Diabetes Paternal Grandfather   . Diabetes Maternal Uncle   . Cancer Neg Hx   . Colon cancer Neg Hx   . Gallbladder disease Neg Hx   . Esophageal cancer Neg Hx   . Rectal cancer Neg Hx   . Stomach cancer Neg Hx     SOCIAL HISTORY Social History   Tobacco Use  . Smoking status: Former Smoker    Quit date: 11/02/1999    Years since quitting: 19.3  . Smokeless tobacco: Never Used  Substance Use Topics  . Alcohol use: No    Alcohol/week: 0.0 standard drinks  . Drug use: No         OPHTHALMIC EXAM:  Base Eye Exam    Visual Acuity (Snellen - Linear)      Right Left   Dist [place] 20/70 +2 20/40 +1   Dist ph [place] 20/40 -1 20/25       Tonometry (Tonopen, 7:47 AM)      Right Left   Pressure 21 19       Pupils      Dark Light Shape React APD   Right 2  1 Round Minimal 0   Left 2 1 Round Minimal 0       Visual Fields      Left Right    Full Full       Extraocular Movement      Right Left    Full Full       Neuro/Psych    Oriented x3: Yes  Mood/Affect: Normal       Dilation    Both eyes: 1.0% Mydriacyl, 2.5% Phenylephrine @ 7:47 AM        Slit Lamp and Fundus Exam    Slit Lamp Exam      Right Left   Lids/Lashes Dermatochalasis - upper lid, mild Meibomian gland dysfunction Dermatochalasis - upper lid, mild Meibomian gland dysfunction   Conjunctiva/Sclera Nasal Pinguecula, Melanosis Nasal Pinguecula, Melanosis   Cornea Arcus, well healed temporal cataract wound, mild endo pigment Arcus, well healed temporal cataract wound, 2+ Punctate epithelial erosions   Anterior Chamber Deep and quiet Deep and quiet   Iris Round and dilated, No NVI Round and dilated, No NVI   Lens 3 piece PC IOL in good position, ?in sulcus PC IOL in good position   Vitreous Mild Vitreous syneresis Vitreous syneresis       Fundus Exam      Right Left   Disc Pink and Sharp Pink and Sharp, mild tilt, temporal PPA   C/D Ratio 0.55 0.6   Macula Blunted foveal reflex,early central atrophy, +edema--improved, scattered Microaneurysms, mild Exudates Flat, good foveal reflex, scattered Microaneurysms, focal laser scars temporally   Vessels Vascular attenuation, Tortuous Vascular attenuation, Tortuous   Periphery Attached, 360 MA Attached, 360 MA          IMAGING AND PROCEDURES  Imaging and Procedures for _0 @  OCT, Retina - OU - Both Eyes       Right Eye Quality was good. Central Foveal Thickness: 246. Progression has improved. Findings include intraretinal fluid, no SRF, intraretinal hyper-reflective material, normal foveal contour (Interval improvement in IRF/DME ST to fovea).   Left Eye Quality was good. Central Foveal Thickness: 255. Progression has been stable. Findings include normal foveal contour, no IRF, no SRF (partial PVD; focal  patches of ORA temporal macula, ?focal laser scars).   Notes *Images captured and stored on drive  Diagnosis / Impression:  OD: interval improvement in IRF/DME ST to fovea OS: NFP, no IRF/SRF; partial PVD; focal patches of ORA temporal macula, ?focal laser scars---stable  Clinical management:  See below  Abbreviations: NFP - Normal foveal profile. CME - cystoid macular edema. PED - pigment epithelial detachment. IRF - intraretinal fluid. SRF - subretinal fluid. EZ - ellipsoid zone. ERM - epiretinal membrane. ORA - outer retinal atrophy. ORT - outer retinal tubulation. SRHM - subretinal hyper-reflective material        Intravitreal Injection, Pharmacologic Agent - OD - Right Eye       Time Out 03/21/2019. 8:03 AM. Confirmed correct patient, procedure, site, and patient consented.   Anesthesia Topical anesthesia was used. Anesthetic medications included Lidocaine 2%, Proparacaine 0.5%.   Procedure Preparation included 5% betadine to ocular surface, eyelid speculum. A 30 gauge needle was used.   Injection:  1.25 mg Bevacizumab (AVASTIN) SOLN   NDC: 59093-112-16, Lot: [id number] , Expiration date: 05/29/2019   Route: Intravitreal, Site: Right Eye, Waste: 0 mL  Post-op Post injection exam found visual acuity of at least counting fingers. The patient tolerated the procedure well. There were no complications. The patient received written and verbal post procedure care education.                 ASSESSMENT/PLAN:    ICD-10-CM   1. Moderate nonproliferative diabetic retinopathy of both eyes with macular edema associated with type 2 diabetes mellitus (HCC)  K44.6950 Intravitreal Injection, Pharmacologic Agent - OD - Right Eye    Bevacizumab (AVASTIN) SOLN 1.25 mg  2. Retinal  edema  H35.81 OCT, Retina - OU - Both Eyes  3. Essential hypertension  I10   4. Hypertensive retinopathy of both eyes  H35.033   5. Pseudophakia of both eyes  Z96.1     1,2. Moderate  Non-proliferative diabetic retinopathy with DME, OU (OD>OS)  - OD: s/p injections with Dr. Zadie Rhine  - OS: s/p focal laser temporal macua  - s/p IVA OD #1 (11.06.20), #2 (12.04.20)  - delayed f/u--7 weeks instead of 4 weeks  - exam shows scattered MA OU; no frank NV  - BCVA 20/40-1 -- slightly worse  - OCT shows interval improvement diabetic macular edema OD:  NFP no IRF/SRF OS  - FA 12.4.2020 shows late leaking MA OU; no NVE OU  - recommend IVA #3 OD today, 01.22.21  - pt wishes to proceed  - RBA of procedure discussed, questions answered  - informed consent obtained and signed  - see procedure note  - obtained records from Dr. Zadie Rhine spanning from 2013-2015  - f/u in 4 wks, OCT, DFE  3,4. Hypertensive retinopathy OU  - discussed importance of tight BP control  - monitor  5. Pseudophakia OU  - s/p CE/IOL OU (Dr. Talbert Forest)  - beautiful surgeries, doing well  - monitor   Ophthalmic Meds Ordered this visit:  Meds ordered this encounter  Medications  . Bevacizumab (AVASTIN) SOLN 1.25 mg       Return in about 4 weeks (around 04/18/2019) for f/u DME -- Dilated Exam, OCT, Possible Injxn.  There are no Patient Instructions on file for this visit.   Explained the diagnoses, plan, and follow up with the patient and they expressed understanding.  Patient expressed understanding of the importance of proper follow up care.   This document serves as a record of services personally performed by Gardiner Sleeper, MD, PhD. It was created on their behalf by Roselee Nova, COMT. The creation of this record is the provider's dictation and/or activities during the visit.  Electronically signed by: Roselee Nova, COMT 03/21/19 8:26 AM  Gardiner Sleeper, M.D., Ph.D. Diseases & Surgery of the Retina and [place] 03/21/2019   I have reviewed the above documentation for accuracy and completeness, and I agree with the above. Gardiner Sleeper, M.D., Ph.D. 03/21/19 8:27  AM   Abbreviations: M myopia (nearsighted); A astigmatism; H hyperopia (farsighted); P presbyopia; Mrx spectacle prescription;  CTL contact lenses; OD right eye; OS left eye; OU both eyes  XT exotropia; ET esotropia; PEK punctate epithelial keratitis; PEE punctate epithelial erosions; DES dry eye syndrome; MGD meibomian gland dysfunction; ATs artificial tears; PFAT's preservative free artificial tears; [place] nuclear sclerotic cataract; PSC posterior subcapsular cataract; ERM epi-retinal membrane; PVD posterior vitreous detachment; RD retinal detachment; DM diabetes mellitus; DR diabetic retinopathy; NPDR non-proliferative diabetic retinopathy; PDR proliferative diabetic retinopathy; CSME clinically significant macular edema; DME diabetic macular edema; dbh dot blot hemorrhages; CWS cotton wool spot; POAG primary open angle glaucoma; C/D cup-to-disc ratio; HVF humphrey visual field; GVF goldmann visual field; OCT optical coherence tomography; IOP intraocular pressure; BRVO Branch retinal vein occlusion; CRVO central retinal vein occlusion; CRAO central retinal artery occlusion; BRAO branch retinal artery occlusion; RT retinal tear; SB scleral buckle; PPV pars plana vitrectomy; VH Vitreous hemorrhage; PRP panretinal laser photocoagulation; IVK intravitreal kenalog; VMT vitreomacular traction; MH Macular hole;  NVD neovascularization of the disc; NVE neovascularization elsewhere; AREDS age related eye disease study; ARMD age related macular degeneration; POAG primary open angle glaucoma; EBMD epithelial/anterior  basement membrane dystrophy; ACIOL anterior chamber intraocular lens; IOL intraocular lens; PCIOL posterior chamber intraocular lens; Phaco/IOL phacoemulsification with intraocular lens placement; [place] photorefractive keratectomy; LASIK laser assisted in situ keratomileusis; HTN hypertension; DM diabetes mellitus; COPD chronic obstructive pulmonary disease

## 2019-04-16 ENCOUNTER — Telehealth: Payer: Self-pay | Admitting: Podiatry

## 2019-04-16 ENCOUNTER — Ambulatory Visit (INDEPENDENT_AMBULATORY_CARE_PROVIDER_SITE_OTHER): Payer: Medicare HMO

## 2019-04-16 ENCOUNTER — Ambulatory Visit: Payer: Medicare HMO | Admitting: Podiatry

## 2019-04-16 ENCOUNTER — Other Ambulatory Visit: Payer: Self-pay

## 2019-04-16 ENCOUNTER — Encounter: Payer: Self-pay | Admitting: Podiatry

## 2019-04-16 DIAGNOSIS — I739 Peripheral vascular disease, unspecified: Secondary | ICD-10-CM

## 2019-04-16 DIAGNOSIS — M869 Osteomyelitis, unspecified: Secondary | ICD-10-CM | POA: Diagnosis not present

## 2019-04-16 DIAGNOSIS — E1149 Type 2 diabetes mellitus with other diabetic neurological complication: Secondary | ICD-10-CM | POA: Diagnosis not present

## 2019-04-16 DIAGNOSIS — L97514 Non-pressure chronic ulcer of other part of right foot with necrosis of bone: Secondary | ICD-10-CM | POA: Diagnosis not present

## 2019-04-16 MED ORDER — DOXYCYCLINE HYCLATE 100 MG PO TABS: 100.0000 mg | ORAL_TABLET | Freq: Two times a day (BID) | ORAL | 0 refills | Status: DC

## 2019-04-16 NOTE — Addendum Note (Signed)
Addended by: Teresita Madura on: 04/16/2019 03:31 PM   Modules accepted: Orders

## 2019-04-16 NOTE — Progress Notes (Signed)
Subjective:  Patient ID: [facility], male    DOB: 1955/07/04,  MRN: [id number]  Chief Complaint  Patient presents with  . Foot Pain    pt is here for routine foot care of the right second toe, pt has some oozing, as well as skin falling off, pt states that he does not feel any pain    64 y.o. male presents with the above complaint.  Patient presents with ulceration of the right second digit dorsal aspect.  Patient states this got progressively worse over the last 2 weeks.  He states that he was trying to get and make an appointment but was unable to get into to be seen.  He does not have pain however patient states that there is some oozing and passing associated with it.  Patient is well-known to Dr. Jacqualyn Posey who had performed his other amputations to the same lower extremity.  He would prefer to see Dr. Earleen Newport however he was not available today.  He is worried that he might be losing the second toe.  He denies any other clinical signs of infection.  He denies any redness around the second toe.  He has been ambulating in surgical shoe.   Review of Systems: Negative except as noted in the HPI. Denies N/V/F/Ch.  Past Medical History:  Diagnosis Date  . ALLERGIC RHINITIS 07/02/2008  . Allergy   . Arthritis    RA  . Atrial fibrillation ([place])   . Cataract    removed both eyes   . CHF 06/19/2008  . COUGH DUE TO ACE INHIBITORS 10/05/2008  . DIABETES MELLITUS, TYPE II 09/22/2006  . Diabetic retinopathy of both eyes (HCC)    mild nonproliferative  . ED (erectile dysfunction)   . Glaucoma   . GOUT 09/22/2006  . Hx of adenomatous colonic polyps 06/09/2015  . HYPERCHOLESTEROLEMIA 07/02/2008  . HYPERTENSION 05/31/2009  . HYPOGONADISM, MALE 01/15/2007  . Hypokalemia   . 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 Lymphadenopathy   . Peripheral neuropathy   . PUD (peptic ulcer disease)   . Rheumatoid arthritis(714.0) 09/22/2006    Current Outpatient Medications:  .  Accu-Chek Softclix Lancets  lancets, Use to monitor glucose levels two times per day; E10.49, Disp: 100 each, Rfl: 12 .  Alcohol Swabs (B-D SINGLE USE SWABS REGULAR) PADS, Use to cleanse site prior to use of lancet to obtain droplet of blood two times per day; E10.49, Disp: 200 each, Rfl: 2 .  aspirin 81 MG tablet, Take 81 mg by mouth daily.  , Disp: , Rfl:  .  atorvastatin (LIPITOR) 10 MG tablet, Take 1 tablet (10 mg total) by mouth daily., Disp: 90 tablet, Rfl: 1 .  BD INSULIN SYRINGE ULTRAFINE 31G X 5/16" 0.5 ML MISC, Use with insulin., Disp: 100 each, Rfl: 5 .  Blood Glucose Calibration (ACCU-CHEK AVIVA) SOLN, 1 each by In Vitro route as needed. Use solution prn to evaluate accuracy of meter, Disp: 1 each, Rfl: 1 .  Blood Glucose Monitoring Suppl (ACCU-CHEK AVIVA PLUS) w/Device KIT, 1 each by Does not apply route 2 (two) times daily. Use to monitor glucose levels two times per day; E10.49, Disp: 1 kit, Rfl: 0 .  gabapentin (NEURONTIN) 300 MG capsule, TAKE 1 CAPSULE TWICE DAILY, Disp: 180 capsule, Rfl: 2 .  glucose blood (ACCU-CHEK AVIVA PLUS) test strip, Use to monitor glucose levels two times per day; E10.49, Disp: 100 each, Rfl: 12 .  insulin NPH Human (HUMULIN N,NOVOLIN N) 100 UNIT/ML injection, Inject  0.26 mLs (26 Units total) into the skin every morning. (Patient taking differently: Inject 28 Units into the skin every morning. ), Disp: 30 mL, Rfl: 2 .  insulin regular (NOVOLIN R RELION) 100 units/mL injection, Inject 0.05 mLs (5 Units total) daily with supper into the skin. And syringes 2/day (Patient taking differently: Inject 7 Units into the skin daily with supper. And syringes 2/day), Disp: 10 mL, Rfl: 11 .  losartan (COZAAR) 100 MG tablet, Take 1 tablet (100 mg total) by mouth daily., Disp: 90 tablet, Rfl: 3 .  MONOJECT ULTRA COMFORT SYRINGE 29G X 1/2" 1 ML MISC, USE TO INJECT INSULIN 4 TIMES PER DAY, Disp: 360 each, Rfl: 3 .  doxycycline (VIBRA-TABS) 100 MG tablet, Take 1 tablet (100 mg total) by mouth 2 (two) times  daily., Disp: 28 tablet, Rfl: 0  Social History   Tobacco Use  Smoking Status Former Smoker  . Quit date: 11/02/1999  . Years since quitting: 19.4  Smokeless Tobacco Never Used    Allergies  Allergen Reactions  . Penicillins Swelling    Swelling of the hands   . Terbinafine Hcl Hives  . Zestril [Lisinopril] Cough  . Ciprofloxacin Rash  . Terbinafine Hcl Hives   Objective:  There were no vitals filed for this visit. There is no height or weight on file to calculate BMI. Constitutional Well developed. Well nourished.  Vascular Dorsalis pedis pulses palpable bilaterally. Posterior tibial pulses palpable bilaterally. Capillary refill normal to all digits.  No cyanosis or clubbing noted. Pedal hair growth normal.  Neurologic Normal speech. Oriented to person, place, and time. Epicritic sensation to light touch grossly present bilaterally.  Dermatologic Nails well groomed and normal in appearance. No open wounds. No skin lesions.  Orthopedic:  Right second digit dorsal ulceration noted with exposure of the proximal phalanx.  There is a toe contracture/hammertoe contracture noted of the second digit leading to increased/excessive pressure.  No other clinical signs of infection noted.   Radiographs: 3 views of skeletally mature adult foot noted: There is a hammertoe contracture of the second digit.  There is osteomyelitic changes noted at the head of the proximal phalanx.  No soft tissue emphysema noted.   Assessment:   1. Type II diabetes mellitus with neurological manifestations ([place])   2. Ulcer of right second toe with necrosis of bone ([place])    Plan:  Patient was evaluated and treated and all questions answered.  Right second digit osteomyelitis -I discussed my clinical findings and radiographic findings with the patient in great detail.  Currently patient does not have any active purulent drainage or cellulitis however patient does have an exposure of bone at the second  proximal phalanx head.  Given that there is an exposure of bone, it is assumed that there is osteomyelitis present within the bone.  I explained to the patient that even though this is not an emergent surgery to amputate the second digit we will need to do it as soon as possible.  Patient would like to hold off on removing the toe for now and would like to discuss with Dr. Jacqualyn Posey and would like for him to perform the surgery given that he has a extensive history with Dr. Earleen Newport.  I agree with the plan and explained to the patient that if he sees any worsening signs or symptoms prior to seeing Dr. Jacqualyn Posey including worsening infection with purulent drainage or cellulitis he will need to either call the office right away or present to the  emergency room as soon as possible.  Patient states understanding and will do so. -Patient will be scheduled to see Dr. Jacqualyn Posey tomorrow. -Betadine wet-to-dry dressing was applied to the second digit.  I have asked the patient to continue doing Betadine wet-to-dry dressing until he is seen. -Patient will be weightbearing as tolerated with a surgical shoe. No follow-ups on file.

## 2019-04-16 NOTE — Telephone Encounter (Signed)
Pt was seen in office today and is supposed to have an antibiotic sent in to his pharmacy. Pt forgot to inform him that he would like it sent to [facility] in [place].

## 2019-04-17 ENCOUNTER — Ambulatory Visit: Payer: Medicare HMO | Admitting: Podiatry

## 2019-04-18 ENCOUNTER — Telehealth: Payer: Self-pay | Admitting: Podiatry

## 2019-04-18 NOTE — Telephone Encounter (Signed)
Pt was seen earlier this week by Dr.Patel and wants to make sure that Dr.Wagoner is aware of his current condition and would like to discuss. Pt states he is having pain in his foot and toes again and was offered an appt today with Dr.Patel or Wagoner next week and refused. Please give patient a call.

## 2019-04-21 ENCOUNTER — Ambulatory Visit: Payer: Medicare HMO | Admitting: Podiatry

## 2019-04-21 ENCOUNTER — Telehealth: Payer: Self-pay | Admitting: *Deleted

## 2019-04-21 ENCOUNTER — Encounter: Payer: Self-pay | Admitting: Podiatry

## 2019-04-21 ENCOUNTER — Telehealth: Payer: Self-pay | Admitting: Family Medicine

## 2019-04-21 ENCOUNTER — Other Ambulatory Visit: Payer: Self-pay

## 2019-04-21 ENCOUNTER — Telehealth: Payer: Self-pay

## 2019-04-21 DIAGNOSIS — L97514 Non-pressure chronic ulcer of other part of right foot with necrosis of bone: Secondary | ICD-10-CM

## 2019-04-21 DIAGNOSIS — E1149 Type 2 diabetes mellitus with other diabetic neurological complication: Secondary | ICD-10-CM | POA: Diagnosis not present

## 2019-04-21 DIAGNOSIS — I739 Peripheral vascular disease, unspecified: Secondary | ICD-10-CM

## 2019-04-21 DIAGNOSIS — M869 Osteomyelitis, unspecified: Secondary | ICD-10-CM | POA: Diagnosis not present

## 2019-04-21 MED ORDER — DOXYCYCLINE HYCLATE 100 MG PO TABS: 100.0000 mg | ORAL_TABLET | Freq: Two times a day (BID) | ORAL | 0 refills | Status: DC

## 2019-04-21 NOTE — Telephone Encounter (Signed)
Received paperwork from patient from [facility], for surgery, will send to dr pickard for completion, patient hopes to get this faxed by Friday if at all possible

## 2019-04-21 NOTE — Telephone Encounter (Signed)
DOS 04/25/2019 TRANSMETATARSAL AMPUTATION RT - [id number] ACHILLES LENGTH - [id number]  PER [facility] [employer] NO PRE CERT REQUIRED

## 2019-04-21 NOTE — Progress Notes (Signed)
Subjective: 64 year old male presents the office today for concerns of infection to his right second toe.  He had followed up with Dr. Posey Pronto last week and found to have bone exposure on the PIPJ.  He was started on antibiotics but the wound was stable and patient wanted to discuss surgical options with me.  Overall he feels well and denies any systemic symptoms including fevers, chills, nausea, vomiting.  No calf pain, chest pain, shortness of breath.   Objective: AAO x3, NAD DP/PT pulses decreased bilaterally Previous hallux and third toe amputations which are well-healed.  Deformity present to the second toe resulting ulceration of the dorsal PIPJ there is exposed proximal phalanx.  There is edema to the toe.  Minimal erythema.  There is no ascending cellulitis.  There is no fluctuation or crepitation.  There is no significant malodor. Equinus is present. No open lesions or pre-ulcerative lesions.  No pain with calf compression, swelling, warmth, erythema  Assessment: Osteomyelitis right second toe  Plan: -All treatment options discussed with the patient including all alternatives, risks, complications.  -I intimately reviewed the x-rays that were performed by Dr. Posey Pronto last appointment.  At this time given bone exposure and osteomyelitis recommend amputation of the toe.  However since he has had a previous first and third digit amputation at this point I recommended a transmetatarsal amputation with a heel cord lengthening.  I think we give him a better option long-term.  After long discussion he wished to proceed with this. -We will plan for right transmetatarsal imitation with heel cord lengthening on Friday.  He will need to have a history and physical form performed by his primary care physician prior to this. -I have ordered blood work including complete metabolic panel, CBC, [id number] -We will order arterial studies to be done hopefully before surgery. Monitor for any clinical signs or  symptoms of infection and directed to call the office immediately should any occur or go to the ER. -Patient encouraged to call the office with any questions, concerns, change in symptoms.   I spent 30 minutes coordinating care with this patient and greater than 50% of time in face-to-face discussion.  Trula Slade DPM

## 2019-04-21 NOTE — Telephone Encounter (Signed)
Dr. 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 I spoke with [facility] and she states she has an opening 04/22/2019 at 2:00pm pt should arrive 1:45pm. Faxed orders to [facility] and called pt with the appt time.

## 2019-04-21 NOTE — Patient Instructions (Signed)
Pre-Operative Instructions  Congratulations, you have decided to take an important step towards improving your quality of life.  You can be assured that the doctors and staff at [facility] will be with you every step of the way.  Here are some important things you should know:  1. Plan to be at the surgery center/hospital at least 1 (one) hour prior to your scheduled time, unless otherwise directed by the surgical center/hospital staff.  You must have a responsible adult accompany you, remain during the surgery and drive you home.  Make sure you have directions to the surgical center/hospital to ensure you arrive on time. 2. If you are having surgery at [facility] or [facility], you will need a copy of your medical history and physical form from your family physician within one month prior to the date of surgery. We will give you a form for your primary physician to complete.  3. We make every effort to accommodate the date you request for surgery.  However, there are times where surgery dates or times have to be moved.  We will contact you as soon as possible if a change in schedule is required.   4. No aspirin/ibuprofen for one week before surgery.  If you are on aspirin, any non-steroidal anti-inflammatory medications (Mobic, Aleve, Ibuprofen) should not be taken seven (7) days prior to your surgery.  You make take Tylenol for pain prior to surgery.  5. Medications - If you are taking daily heart and blood pressure medications, seizure, reflux, allergy, asthma, anxiety, pain or diabetes medications, make sure you notify the surgery center/hospital before the day of surgery so they can tell you which medications you should take or avoid the day of surgery. 6. No food or drink after midnight the night before surgery unless directed otherwise by surgical center/hospital staff. 7. No alcoholic beverages 24-hours prior to surgery.  No smoking 24-hours prior or 24-hours after  surgery. 8. Wear loose pants or shorts. They should be loose enough to fit over bandages, boots, and casts. 9. Don't wear slip-on shoes. Sneakers are preferred. 10. Bring your boot with you to the surgery center/hospital.  Also bring crutches or a walker if your physician has prescribed it for you.  If you do not have this equipment, it will be provided for you after surgery. 11. If you have not been contacted by the surgery center/hospital by the day before your surgery, call to confirm the date and time of your surgery. 12. Leave-time from work may vary depending on the type of surgery you have.  Appropriate arrangements should be made prior to surgery with your employer. 13. Prescriptions will be provided immediately following surgery by your doctor.  Fill these as soon as possible after surgery and take the medication as directed. Pain medications will not be refilled on weekends and must be approved by the doctor. 14. Remove nail polish on the operative foot and avoid getting pedicures prior to surgery. 15. Wash the night before surgery.  The night before surgery wash the foot and leg well with water and the antibacterial soap provided. Be sure to pay special attention to beneath the toenails and in between the toes.  Wash for at least three (3) minutes. Rinse thoroughly with water and dry well with a towel.  Perform this wash unless told not to do so by your physician.  Enclosed: 1 Ice pack (please put in freezer the night before surgery)   1 Hibiclens skin cleaner     Pre-op instructions  If you have any questions regarding the instructions, please do not hesitate to call our office.  [place]: [address], [place], [place] [address] -- [phone]  [place]: [address] [place], [place] [address] -- [phone]  [place]: [address], [place], [place] [address] -- [phone]   Website: [email or web address] 

## 2019-04-22 ENCOUNTER — Other Ambulatory Visit: Payer: Self-pay

## 2019-04-22 ENCOUNTER — Other Ambulatory Visit: Payer: Self-pay | Admitting: Podiatry

## 2019-04-22 ENCOUNTER — Other Ambulatory Visit (HOSPITAL_COMMUNITY)
Admission: RE | Admit: 2019-04-22 | Discharge: 2019-04-22 | Disposition: A | Discharge status: Home / Self Care | Payer: Medicare HMO | Source: Ambulatory Visit | Attending: Podiatry | Admitting: Podiatry

## 2019-04-22 ENCOUNTER — Ambulatory Visit: Payer: Medicare HMO | Admitting: Podiatry

## 2019-04-22 ENCOUNTER — Ambulatory Visit (HOSPITAL_COMMUNITY)
Admission: RE | Admit: 2019-04-22 | Discharge: 2019-04-22 | Disposition: A | Discharge status: Home / Self Care | Payer: Medicare HMO | Source: Ambulatory Visit | Attending: Cardiology | Admitting: Cardiology

## 2019-04-22 DIAGNOSIS — I739 Peripheral vascular disease, unspecified: Secondary | ICD-10-CM

## 2019-04-22 DIAGNOSIS — I70203 Unspecified atherosclerosis of native arteries of extremities, bilateral legs: Secondary | ICD-10-CM | POA: Diagnosis not present

## 2019-04-22 DIAGNOSIS — L97514 Non-pressure chronic ulcer of other part of right foot with necrosis of bone: Secondary | ICD-10-CM

## 2019-04-22 DIAGNOSIS — E1149 Type 2 diabetes mellitus with other diabetic neurological complication: Secondary | ICD-10-CM

## 2019-04-22 DIAGNOSIS — Z01812 Encounter for preprocedural laboratory examination: Secondary | ICD-10-CM | POA: Insufficient documentation

## 2019-04-22 DIAGNOSIS — Z20822 Contact with and (suspected) exposure to covid-19: Secondary | ICD-10-CM | POA: Insufficient documentation

## 2019-04-22 DIAGNOSIS — E11621 Type 2 diabetes mellitus with foot ulcer: Secondary | ICD-10-CM | POA: Diagnosis not present

## 2019-04-22 DIAGNOSIS — E1151 Type 2 diabetes mellitus with diabetic peripheral angiopathy without gangrene: Secondary | ICD-10-CM | POA: Insufficient documentation

## 2019-04-22 LAB — COMPLETE METABOLIC PANEL WITH GFR
AG Ratio: 1.2 (calc) (ref 1.0–2.5)
ALT: 23 U/L (ref 9–46)
AST: 44 U/L — ABNORMAL HIGH (ref 10–35)
Albumin: 4.3 g/dL (ref 3.6–5.1)
Alkaline phosphatase (APISO): 94 U/L (ref 35–144)
BUN: 23 mg/dL (ref 7–25)
CO2: 23 mmol/L (ref 20–32)
Calcium: 9.4 mg/dL (ref 8.6–10.3)
Chloride: 108 mmol/L (ref 98–110)
Creat: 1.04 mg/dL (ref 0.70–1.25)
GFR, Est African American: 88 mL/min/{1.73_m2} (ref >=60)
GFR, Est Non African American: 76 mL/min/{1.73_m2} (ref >=60)
Globulin: 3.7 g/dL (calc) (ref 1.9–3.7)
Glucose, Bld: 118 mg/dL (ref 65–139)
Potassium: 5.2 mmol/L (ref 3.5–5.3)
Sodium: 140 mmol/L (ref 135–146)
Total Bilirubin: 0.7 mg/dL (ref 0.2–1.2)
Total Protein: 8 g/dL (ref 6.1–8.1)

## 2019-04-22 LAB — CBC WITH DIFFERENTIAL/PLATELET
Absolute Monocytes: 488 cells/uL (ref 200–950)
Basophils Absolute: 80 cells/uL (ref 0–200)
Basophils Relative: 2 %
Eosinophils Absolute: 192 cells/uL (ref 15–500)
Eosinophils Relative: 4.8 %
HCT: 37.1 % — ABNORMAL LOW (ref 38.5–50.0)
Hemoglobin: 12.5 g/dL — ABNORMAL LOW (ref 13.2–17.1)
Lymphs Abs: 1416 cells/uL (ref 850–3900)
MCH: 28 pg (ref 27.0–33.0)
MCHC: 33.7 g/dL (ref 32.0–36.0)
MCV: 83.2 fL (ref 80.0–100.0)
MPV: 11.2 fL (ref 7.5–12.5)
Monocytes Relative: 12.2 %
Neutro Abs: 1824 cells/uL (ref 1500–7800)
Neutrophils Relative %: 45.6 %
Platelets: 229 10*3/uL (ref 140–400)
RBC: 4.46 10*6/uL (ref 4.20–5.80)
RDW: 13 % (ref 11.0–15.0)
Total Lymphocyte: 35.4 %
WBC: 4 10*3/uL (ref 3.8–10.8)

## 2019-04-22 LAB — HEMOGLOBIN A1C
Hgb A1c MFr Bld: 11.4 % of total Hgb — ABNORMAL HIGH (ref <5.7)
Mean Plasma Glucose: 280 (calc)
eAG (mmol/L): 15.5 (calc)

## 2019-04-22 LAB — SARS CORONAVIRUS 2 (TAT 6-24 HRS): SARS Coronavirus 2: NEGATIVE

## 2019-04-22 NOTE — Telephone Encounter (Signed)
Per Dr. Dennard Schaumann pt needs apt - pt called and apt made

## 2019-04-23 ENCOUNTER — Encounter (INDEPENDENT_AMBULATORY_CARE_PROVIDER_SITE_OTHER): Payer: Medicare HMO | Admitting: Ophthalmology

## 2019-04-23 ENCOUNTER — Telehealth: Payer: Self-pay

## 2019-04-23 NOTE — Progress Notes (Signed)
PCP - Dr. Jenna Luo Cardiologist -   HGA1C, CMP, and CBC w/Diff in Epic dated 04-21-19  Chest x-ray -  EKG -  Stress Test -  ECHO -  Cardiac Cath -   Sleep Study -  CPAP -   Fasting Blood Sugar -  Checks Blood Sugar _____ times a day  Blood Thinner Instructions: Aspirin Instructions: Last Dose:  Anesthesia review: Hx of HTN, CHF, and DM  Patient denies shortness of breath, fever, cough and chest pain at PAT appointment   Patient verbalized understanding of instructions that were given to them at the PAT appointment. Patient was also instructed that they will need to review over the PAT instructions again at home before surgery.

## 2019-04-23 NOTE — Patient Instructions (Addendum)
DUE TO COVID-19 ONLY ONE VISITOR IS ALLOWED TO COME WITH YOU AND STAY IN THE WAITING ROOM ONLY DURING PRE OP AND PROCEDURE DAY OF SURGERY. THE 1 VISITOR MAY VISIT WITH YOU AFTER SURGERY IN YOUR PRIVATE ROOM DURING VISITING HOURS ONLY!  YOU HAVE HAD A COVID 19 TEST. PLEASE BEGIN THE QUARANTINE INSTRUCTIONS AS OUTLINED IN YOUR HANDOUT.                Aaron Fox  04/23/2019   Your procedure is scheduled on: 04-25-19   Report to [facility] Main  Entrance    Report to admitting at 11:30 AM     Call this number if you have problems the morning of surgery [phone]    Remember: Do not eat food or drink liquids :After Midnight.  You may have a Clear Liquid Diet from  Midnight until 7:30 AM. After 7:30 AM, nothing until after surgery.    CLEAR LIQUID DIET   Foods Allowed                                                                     Foods Excluded  Coffee and tea, regular and decaf                             liquids that you cannot  Plain Jell-O any favor except red or purple                                           see through such as: Fruit ices (not with fruit pulp)                                     milk, soups, orange juice  Iced Popsicles                                    All solid food Carbonated beverages, diet                                    Cranberry, grape and apple juices Sports drinks like Gatorade Lightly seasoned clear broth or consume(fat free) Sugar, honey syrup   _____________________________________________________________________    Take these medicines the morning of surgery with A SIP OF WATER: Gabapentin (Neurontin), and you may use and bring your eyedrops  BRUSH YOUR TEETH MORNING OF SURGERY AND RINSE YOUR MOUTH OUT, NO CHEWING GUM CANDY OR MINTS.   DO NOT TAKE ANY DIABETIC MEDICATIONS DAY OF YOUR SURGERY                               You may not have any metal on your body including hair pins and              piercings      Do not wear  jewelry, cologne, lotions, powders or deodorant               Men may shave face and neck.   Do not bring valuables to the hospital. [place].  Contacts, dentures or bridgework may not be worn into surgery.   You may bring an overnight bag   Special Instructions: N/A              Please read over the following fact sheets you were given: _____________________________________________________________________             How to Manage Your Diabetes Before and After Surgery  Why is it important to control my blood sugar before and after surgery? . Improving blood sugar levels before and after surgery helps healing and can limit problems. . A way of improving blood sugar control is eating a healthy diet by: o  Eating less sugar and carbohydrates o  Increasing activity/exercise o  Talking with your doctor about reaching your blood sugar goals . High blood sugars (greater than 180 mg/dL) can raise your risk of infections and slow your recovery, so you will need to focus on controlling your diabetes during the weeks before surgery. . Make sure that the doctor who takes care of your diabetes knows about your planned surgery including the date and location.  How do I manage my blood sugar before surgery? . Check your blood sugar at least 4 times a day, starting 2 days before surgery, to make sure that the level is not too high or low. o Check your blood sugar the morning of your surgery when you wake up and every 2 hours until you get to the Short Stay unit. . If your blood sugar is less than 70 mg/dL, you will need to treat for low blood sugar: o Do not take insulin. o Treat a low blood sugar (less than 70 mg/dL) with  cup of clear juice (cranberry or apple), 4 glucose tablets, OR glucose gel. o Recheck blood sugar in 15 minutes after treatment (to make sure it is greater than 70 mg/dL). If your blood sugar is not greater  than 70 mg/dL on recheck, call [phone] for further instructions. . Report your blood sugar to the short stay nurse when you get to Short Stay.  . If you are admitted to the hospital after surgery: o Your blood sugar will be checked by the staff and you will probably be given insulin after surgery (instead of oral diabetes medicines) to make sure you have good blood sugar levels. o The goal for blood sugar control after surgery is 80-180 mg/dL.   WHAT DO I DO ABOUT MY DIABETES MEDICATION?  Marland Kitchen Do not take oral diabetes medicines (pills) the morning of surgery.  . THE DAY BEFORE SURGERY, take your usual 26 units of Humulin insulin.   . No bedtime dose of Novolin R        Aaron Fox - Preparing for Surgery Before surgery, you can play an important role.  Because skin is not sterile, your skin needs to be as free of germs as possible.  You can reduce the number of germs on your skin by washing with CHG (chlorahexidine gluconate) soap before surgery.  CHG is an antiseptic cleaner which kills germs and bonds with the skin to continue killing germs even after washing. Please DO NOT use if you have an  allergy to CHG or antibacterial soaps.  If your skin becomes reddened/irritated stop using the CHG and inform your nurse when you arrive at Short Stay. Do not shave (including legs and underarms) for at least 48 hours prior to the first CHG shower.  You may shave your face/neck. Please follow these instructions carefully:  1.  Shower with CHG Soap the night before surgery and the  morning of Surgery.  2.  If you choose to wash your hair, wash your hair first as usual with your  normal  shampoo.  3.  After you shampoo, rinse your hair and body thoroughly to remove the  shampoo.                           4.  Use CHG as you would any other liquid soap.  You can apply chg directly  to the skin and wash                       Gently with a scrungie or clean washcloth.  5.  Apply the CHG Soap to your  body ONLY FROM THE NECK DOWN.   Do not use on face/ open                           Wound or open sores. Avoid contact with eyes, ears mouth and genitals (private parts).                       Wash face,  Genitals (private parts) with your normal soap.             6.  Wash thoroughly, paying special attention to the area where your surgery  will be performed.  7.  Thoroughly rinse your body with warm water from the neck down.  8.  DO NOT shower/wash with your normal soap after using and rinsing off  the CHG Soap.                9.  Pat yourself dry with a clean towel.            10.  Wear clean pajamas.            11.  Place clean sheets on your bed the night of your first shower and do not  sleep with pets. Day of Surgery : Do not apply any lotions/deodorants the morning of surgery.  Please wear clean clothes to the hospital/surgery center.  FAILURE TO FOLLOW THESE INSTRUCTIONS MAY RESULT IN THE CANCELLATION OF YOUR SURGERY PATIENT SIGNATURE_________________________________  NURSE SIGNATURE__________________________________  ________________________________________________________________________

## 2019-04-23 NOTE — Progress Notes (Signed)
Please place orders. Pt is scheduled for his PAT on 04-24-19.

## 2019-04-23 NOTE — Telephone Encounter (Signed)
Patient called stating he was returning Dr. Leigh [place] call. I told him he was in surgery this morning but I would send him a message to call him again.

## 2019-04-24 ENCOUNTER — Encounter: Payer: Self-pay | Admitting: Family Medicine

## 2019-04-24 ENCOUNTER — Ambulatory Visit (INDEPENDENT_AMBULATORY_CARE_PROVIDER_SITE_OTHER): Payer: Medicare HMO | Admitting: Family Medicine

## 2019-04-24 ENCOUNTER — Encounter (HOSPITAL_COMMUNITY): Payer: Self-pay

## 2019-04-24 ENCOUNTER — Other Ambulatory Visit: Payer: Self-pay

## 2019-04-24 ENCOUNTER — Ambulatory Visit: Payer: Medicare HMO | Admitting: Podiatry

## 2019-04-24 ENCOUNTER — Encounter (HOSPITAL_COMMUNITY)
Admission: RE | Admit: 2019-04-24 | Discharge: 2019-04-24 | Disposition: A | Discharge status: Home / Self Care | Payer: Medicare HMO | Source: Ambulatory Visit | Attending: Podiatry | Admitting: Podiatry

## 2019-04-24 VITALS — BP 140/70 | HR 68 | Temp 96.5°F | Resp 14 | Ht 71.5 in | Wt 230.0 lb

## 2019-04-24 DIAGNOSIS — M216X1 Other acquired deformities of right foot: Secondary | ICD-10-CM | POA: Diagnosis not present

## 2019-04-24 DIAGNOSIS — H409 Unspecified glaucoma: Secondary | ICD-10-CM | POA: Diagnosis not present

## 2019-04-24 DIAGNOSIS — E7800 Pure hypercholesterolemia, unspecified: Secondary | ICD-10-CM | POA: Insufficient documentation

## 2019-04-24 DIAGNOSIS — Z87891 Personal history of nicotine dependence: Secondary | ICD-10-CM | POA: Insufficient documentation

## 2019-04-24 DIAGNOSIS — M869 Osteomyelitis, unspecified: Secondary | ICD-10-CM | POA: Insufficient documentation

## 2019-04-24 DIAGNOSIS — E1165 Type 2 diabetes mellitus with hyperglycemia: Secondary | ICD-10-CM | POA: Diagnosis not present

## 2019-04-24 DIAGNOSIS — I509 Heart failure, unspecified: Secondary | ICD-10-CM | POA: Insufficient documentation

## 2019-04-24 DIAGNOSIS — Z7982 Long term (current) use of aspirin: Secondary | ICD-10-CM | POA: Diagnosis not present

## 2019-04-24 DIAGNOSIS — Z79899 Other long term (current) drug therapy: Secondary | ICD-10-CM | POA: Insufficient documentation

## 2019-04-24 DIAGNOSIS — E11319 Type 2 diabetes mellitus with unspecified diabetic retinopathy without macular edema: Secondary | ICD-10-CM | POA: Diagnosis not present

## 2019-04-24 DIAGNOSIS — E11628 Type 2 diabetes mellitus with other skin complications: Secondary | ICD-10-CM

## 2019-04-24 DIAGNOSIS — Z01810 Encounter for preprocedural cardiovascular examination: Secondary | ICD-10-CM | POA: Insufficient documentation

## 2019-04-24 DIAGNOSIS — Z01818 Encounter for other preprocedural examination: Secondary | ICD-10-CM | POA: Insufficient documentation

## 2019-04-24 DIAGNOSIS — I110 Hypertensive heart disease with heart failure: Secondary | ICD-10-CM | POA: Insufficient documentation

## 2019-04-24 DIAGNOSIS — Z794 Long term (current) use of insulin: Secondary | ICD-10-CM | POA: Insufficient documentation

## 2019-04-24 DIAGNOSIS — Z01812 Encounter for preprocedural laboratory examination: Secondary | ICD-10-CM | POA: Diagnosis not present

## 2019-04-24 DIAGNOSIS — E1136 Type 2 diabetes mellitus with diabetic cataract: Secondary | ICD-10-CM | POA: Insufficient documentation

## 2019-04-24 DIAGNOSIS — IMO0002 Reserved for concepts with insufficient information to code with codable children: Secondary | ICD-10-CM

## 2019-04-24 DIAGNOSIS — I4891 Unspecified atrial fibrillation: Secondary | ICD-10-CM | POA: Insufficient documentation

## 2019-04-24 DIAGNOSIS — M069 Rheumatoid arthritis, unspecified: Secondary | ICD-10-CM | POA: Diagnosis not present

## 2019-04-24 LAB — GLUCOSE, CAPILLARY: Glucose-Capillary: 78 mg/dL (ref 70–99)

## 2019-04-24 NOTE — Telephone Encounter (Signed)
I returned his call last night and left VM with my cell. He called me back last night.

## 2019-04-24 NOTE — Progress Notes (Signed)
Subjective:    Patient ID: Aaron Fox, male    DOB: January 11, 1956, 64 y.o.   MRN: [id number]  HPI  Patient establish care with me in August of last year.  This is been a only encounter.  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  His most recent hemoglobin A1c was 11.  He states that he has not seen his endocrinologist in more than a year primarily due to the Covid pandemic.  He admits that up until 3 weeks ago he was not managing his sugars.  He was not checking his sugars although he states that he was being compliant with his insulin.  He is currently taking Novolin in 28 units in the morning.  He takes regular insulin 7 units with supper.  He has been checking his blood sugars 3 times a day for the last 3 weeks.  He states that his sugars are now well controlled.  His fasting blood sugars are between 80 and 120 2 in the morning.  His evening blood sugars are between 120 and 166.  He denies any blood sugars greater than 200.  He denies any hypoglycemic episodes.  I asked the patient what changed and he states that he is now eating better and avoiding soda and bread and cakes and cookies.  However again he states that he was being compliant in the way he was taking his insulin. Hospital Outpatient Visit on 04/22/2019  Component Date Value Ref Range Status  . SARS Coronavirus 2 04/22/2019 NEGATIVE  NEGATIVE Final   Comment: (NOTE) SARS-CoV-2 target nucleic acids are NOT DETECTED. The SARS-CoV-2 RNA is generally detectable in upper and lower respiratory specimens during the acute phase of infection. Negative results do not preclude SARS-CoV-2 infection, do not rule out co-infections with other pathogens, and should not be used as the sole basis for treatment or other patient management decisions. Negative results must be combined with clinical observations, patient history,  and epidemiological information. The expected result is Negative. Fact Sheet for Patients: SugarRoll.be Fact Sheet for Healthcare Providers: https://www.woods-mathews.com/ This test is not yet approved or cleared by the [place] FDA and  has been authorized for detection and/or diagnosis of SARS-CoV-2 by FDA under an Emergency Use Authorization (EUA). This EUA will remain  in effect (meaning this test can be used) for the duration of the COVID-19 declaration under Section 56                          4(b)(1) of the Act, 21 U.S.C. section 360bbb-3(b)(1), unless the authorization is terminated or revoked sooner. Performed at [facility], [place] [address] [place], [place] [address]   Office Visit on 04/21/2019  Component Date Value Ref Range Status  . WBC 04/21/2019 4.0  3.8 - 10.8 Thousand/uL Final  . RBC 04/21/2019 4.46  4.20 - 5.80 Million/uL Final  . Hemoglobin 04/21/2019 12.5* 13.2 - 17.1 g/dL Final  . HCT 04/21/2019 37.1* 38.5 - 50.0 % Final  . MCV 04/21/2019 83.2  80.0 - 100.0 fL Final  . MCH 04/21/2019 28.0  27.0 - 33.0 pg Final  . MCHC 04/21/2019 33.7  32.0 - 36.0 g/dL Final  . RDW 04/21/2019 13.0  11.0 - 15.0 % Final  . Platelets 04/21/2019 229  140 - 400 Thousand/uL Final  . MPV 04/21/2019 11.2  7.5 - 12.5 fL  Final  . Neutro Abs 04/21/2019 1,824  1,500 - 7,800 cells/uL Final  . Lymphs Abs 04/21/2019 1,416  850 - 3,900 cells/uL Final  . Absolute Monocytes 04/21/2019 488  200 - 950 cells/uL Final  . Eosinophils Absolute 04/21/2019 192  15 - 500 cells/uL Final  . Basophils Absolute 04/21/2019 80  0 - 200 cells/uL Final  . Neutrophils Relative % 04/21/2019 45.6  % Final  . Total Lymphocyte 04/21/2019 35.4  % Final  . Monocytes Relative 04/21/2019 12.2  % Final  . Eosinophils Relative 04/21/2019 4.8  % Final  . Basophils Relative 04/21/2019 2.0  % Final  . Glucose, Bld 04/21/2019 118  65 - 139 mg/dL Final   Comment: .         Non-fasting reference interval .   . BUN 04/21/2019 23  7 - 25 mg/dL Final  . Creat 04/21/2019 1.04  0.70 - 1.25 mg/dL Final   Comment: For patients >63 years of age, the reference limit for Creatinine is approximately 13% higher for people identified as African-American. .   . GFR, Est Non African American 04/21/2019 76  > OR = 60 mL/min/1.65m Final  . GFR, Est African American 04/21/2019 88  > OR = 60 mL/min/1.761mFinal  . BUN/Creatinine Ratio 0299/83/3825OT APPLICABLE  6 - 22 (calc) Final  . Sodium 04/21/2019 140  135 - 146 mmol/L Final  . Potassium 04/21/2019 5.2  3.5 - 5.3 mmol/L Final  . Chloride 04/21/2019 108  98 - 110 mmol/L Final  . CO2 04/21/2019 23  20 - 32 mmol/L Final  . Calcium 04/21/2019 9.4  8.6 - 10.3 mg/dL Final  . Total Protein 04/21/2019 8.0  6.1 - 8.1 g/dL Final  . Albumin 04/21/2019 4.3  3.6 - 5.1 g/dL Final  . Globulin 04/21/2019 3.7  1.9 - 3.7 g/dL (calc) Final  . AG Ratio 04/21/2019 1.2  1.0 - 2.5 (calc) Final  . Total Bilirubin 04/21/2019 0.7  0.2 - 1.2 mg/dL Final  . Alkaline phosphatase (APISO) 04/21/2019 94  35 - 144 U/L Final  . AST 04/21/2019 44* 10 - 35 U/L Final   Comment: Results slightly increased due to hemolysis. .   . ALT 04/21/2019 23  9 - 46 U/L Final  . Hgb A1c MFr Bld 04/21/2019 11.4* <5.7 % of total Hgb Final   Comment: For someone without known diabetes, a hemoglobin A1c value of 6.5% or greater indicates that they may have  diabetes and this should be confirmed with a follow-up  test. . For someone with known diabetes, a value <7% indicates  that their diabetes is well controlled and a value  greater than or equal to 7% indicates suboptimal  control. A1c targets should be individualized based on  duration of diabetes, age, comorbid conditions, and  other considerations. . Currently, no consensus exists regarding use of hemoglobin A1c for diagnosis of diabetes for children. .   . Mean Plasma Glucose 04/21/2019 280  (calc)  Final  . eAG (mmol/L) 04/21/2019 15.5  (calc) Final   Past Medical History:  Diagnosis Date  . ALLERGIC RHINITIS 07/02/2008  . Allergy   . Arthritis    RA  . Atrial fibrillation ([facility]  . Cataract    removed both eyes   . CHF 06/19/2008  . COUGH DUE TO ACE INHIBITORS 10/05/2008  . DIABETES MELLITUS, TYPE II 09/22/2006  . Diabetic retinopathy of both eyes (HCC)    mild nonproliferative  . ED (erectile dysfunction)   .  Glaucoma   . GOUT 09/22/2006  . Hx of adenomatous colonic polyps 06/09/2015  . HYPERCHOLESTEROLEMIA 07/02/2008  . HYPERTENSION 05/31/2009  . HYPOGONADISM, MALE 01/15/2007  . Hypokalemia   . 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 Lymphadenopathy   . Peripheral neuropathy   . PUD (peptic ulcer disease)   . Rheumatoid arthritis(714.0) 09/22/2006   Past Surgical History:  Procedure Laterality Date  . CATARACT EXTRACTION Bilateral    Dr. Talbert Forest  . CATARACT EXTRACTION, BILATERAL    . COLONOSCOPY  2004  . LYMPH NODE BIOPSY Right 10/16/2014   Procedure: RIGHT INGUINAL LYMPH NODE BIOPSY;  Surgeon: Aviva Signs, MD;  Location: [facility];  Service: General;  Laterality: Right;   Current Outpatient Medications on File Prior to Visit  Medication Sig Dispense Refill  . Accu-Chek Softclix Lancets lancets Use to monitor glucose levels two times per day; E10.49 100 each 12  . Alcohol Swabs (B-D SINGLE USE SWABS REGULAR) PADS Use to cleanse site prior to use of lancet to obtain droplet of blood two times per day; E10.49 200 each 2  . aspirin 81 MG tablet Take 81 mg by mouth daily.      Marland Kitchen atorvastatin (LIPITOR) 10 MG tablet Take 1 tablet (10 mg total) by mouth daily. 90 tablet 1  . BD INSULIN SYRINGE ULTRAFINE 31G X 5/16" 0.5 ML MISC Use with insulin. 100 each 5  . Blood Glucose Calibration (ACCU-CHEK AVIVA) SOLN 1 each by In Vitro route as needed. Use solution prn to evaluate accuracy of meter 1 each 1  . Blood Glucose Monitoring Suppl (ACCU-CHEK AVIVA PLUS) w/Device KIT 1 each by Does  not apply route 2 (two) times daily. Use to monitor glucose levels two times per day; E10.49 1 kit 0  . doxycycline (VIBRA-TABS) 100 MG tablet Take 1 tablet (100 mg total) by mouth 2 (two) times daily. 28 tablet 0  . gabapentin (NEURONTIN) 300 MG capsule TAKE 1 CAPSULE TWICE DAILY (Patient taking differently: Take 300 mg by mouth 2 (two) times daily. ) 180 capsule 2  . glucose blood (ACCU-CHEK AVIVA PLUS) test strip Use to monitor glucose levels two times per day; E10.49 100 each 12  . ibuprofen (ADVIL) 200 MG tablet Take 400 mg by mouth every 8 (eight) hours as needed for headache or moderate pain.    Marland Kitchen insulin NPH Human (HUMULIN N,NOVOLIN N) 100 UNIT/ML injection Inject 0.26 mLs (26 Units total) into the skin every morning. (Patient taking differently: Inject 28 Units into the skin daily. ) 30 mL 2  . insulin regular (NOVOLIN R RELION) 100 units/mL injection Inject 0.05 mLs (5 Units total) daily with supper into the skin. And syringes 2/day (Patient taking differently: Inject 7 Units into the skin at bedtime. ) 10 mL 11  . losartan (COZAAR) 100 MG tablet Take 1 tablet (100 mg total) by mouth daily. 90 tablet 3  . MONOJECT ULTRA COMFORT SYRINGE 29G X 1/2" 1 ML MISC USE TO INJECT INSULIN 4 TIMES PER DAY 360 each 3  . Tetrahydrozoline HCl (VISINE OP) Place 1 drop into both eyes daily as needed (redness).     No current facility-administered medications on file prior to visit.   Allergies  Allergen Reactions  . Penicillins Swelling    Swelling of the hands and feet Did it involve swelling of the face/tongue/throat, SOB, or low BP? No Did it involve sudden or severe rash/hives, skin peeling, or any reaction on the inside of your mouth or nose? No Did you  need to seek medical attention at a hospital or doctor's office? Yes When did it last happen?10 + years If all above answers are "NO", may proceed with cephalosporin use.   . Terbinafine Hcl Hives  . Zestril [Lisinopril] Cough  .  Ciprofloxacin Rash   Social History   Socioeconomic History  . Marital status: Married    Spouse name: Not on file  . Number of children: 3  . Years of education: Not on file  . Highest education level: Not on file  Occupational History  . Occupation: Disability    Employer: [employer]  Tobacco Use  . Smoking status: Former Smoker    Quit date: 11/02/1999    Years since quitting: 19.4  . Smokeless tobacco: Never Used  Substance and Sexual Activity  . Alcohol use: No    Alcohol/week: 0.0 standard drinks  . Drug use: No  . Sexual activity: Not on file  Other Topics Concern  . Not on file  Social History Narrative   Married, 3 kids   Prior work [employer] before disability   Regular exercise-yes   Social Determinants of Health   Financial Resource Strain:   . Difficulty of Paying Living Expenses: Not on file  Food Insecurity:   . Worried About Charity fundraiser in the Last Year: Not on file  . Ran Out of Food in the Last Year: Not on file  Transportation Needs:   . Lack of Transportation (Medical): Not on file  . Lack of Transportation (Non-Medical): Not on file  Physical Activity:   . Days of Exercise per Week: Not on file  . Minutes of Exercise per Session: Not on file  Stress:   . Feeling of Stress : Not on file  Social Connections:   . Frequency of Communication with Friends and Family: Not on file  . Frequency of Social Gatherings with Friends and Family: Not on file  . Attends Religious Services: Not on file  . Active Member of Clubs or Organizations: Not on file  . Attends Archivist Meetings: Not on file  . Marital Status: Not on file  Intimate Partner Violence:   . Fear of Current or Ex-Partner: Not on file  . Emotionally Abused: Not on file  . Physically Abused: Not on file  . Sexually Abused: Not on file     Review of Systems  All other systems reviewed and are negative.      Objective:   Physical Exam Vitals reviewed.    Constitutional:      General: He is not in acute distress.    Appearance: Normal appearance. He is normal weight. He is not ill-appearing, toxic-appearing or diaphoretic.  Cardiovascular:     Rate and Rhythm: Normal rate and regular rhythm.     Heart sounds: Normal heart sounds. No murmur.  Pulmonary:     Effort: Pulmonary effort is normal. No respiratory distress.     Breath sounds: Normal breath sounds. No stridor. No wheezing, rhonchi or rales.  Chest:     Chest wall: No tenderness.  Abdominal:     General: Abdomen is flat. Bowel sounds are normal.     Palpations: Abdomen is soft.           Assessment & Plan:  Uncontrolled type 2 diabetes mellitus with skin complication, with long-term current use of insulin ([place])  I explained to the patient that his hemoglobin A1c would suggest an average blood sugar between 280 and 300 over the  last 3 months.  Therefore his insulin regimen is not sufficiently controlling his blood sugars.  I explained to the patient that this would only lead to future complications from his diabetes.  However over the last 3 weeks his blood sugars are essentially perfect.  Therefore I do feel unsafe increasing his insulin since his reported sugars are well within control.  Therefore of asked the patient to check his blood sugars 3 times a day, fasting in the morning, 2 hours after lunch, and 2 hours after supper.  I will review these values in 1 week and further titrate his insulin accordingly to achieve fasting blood sugars between 80 and 130 and 2-hour postprandial sugars between 80 and 160.  However at the present time his reported blood sugars not well controlled and therefore I hesitate to make any changes.

## 2019-04-24 NOTE — Progress Notes (Signed)
Anesthesia Chart Review   Case: [id number] Date/Time: 04/25/19 1315   Procedures:      AMPUTATION FOOT,TRANSMETATARSAL (Right Foot) - BLOCK     ACHILLES LENGTHENING/KIDNER (Right )   Anesthesia type: Regional   Pre-op diagnosis: OSTEOMYLETITIS AND OTHER DEFORMITIES OF FOOT   Location: [facility] ROOM 09 / [facility]   Surgeons: Trula Slade, DPM      DISCUSSION:64 y.o. former smoker (quit 11/02/99) with h/o DM II, HTN, remote h/o A-fib, GERD, CHF, osteomyelitis scheduled for above procedure 04/25/19 with Dr. Celesta Gentile.   Pt seen by PCP, Dr. Dennard Schaumann, 04/24/19.  A1C 11.  No DM medication adjustments made at this visit, sugars have been better controlled recently.  Dr. Dennard Schaumann to evaluate in 1 week.   Pt seen at PAT visit.  Denies shortness of breath, chest pain, palpitations, LE edema.  Activity limited at this point due to osteomyelitis of right foot.    Anticipate pt can proceed with planned procedure barring acute status change.   VS: There were no vitals taken for this visit.  PROVIDERS: [name], MD is PCP    LABS: Labs reviewed: Acceptable for surgery. (all labs ordered are listed, but only abnormal results are displayed)  Labs Reviewed  BASIC METABOLIC PANEL  CBC     IMAGES:   EKG: 04/24/2019 Rate 69 bpm  Normal sinus rhythm with sinus arrhythmia Nonspecific T wave abnormality   CV: Stress Test 05/21/2015  There was no ST segment deviation noted during stress.  Good exercise capacity 8 minutes  No ischemic changes, low risk exercise treadmill test.  Echo 05/17/2015 Study Conclusions   - Left ventricle: The cavity size was normal. Wall thickness was  increased in a pattern of mild LVH. Systolic function was normal.  The estimated ejection fraction was in the range of 55% to 60%.  Wall motion was normal; there were no regional wall motion  abnormalities. Doppler parameters are consistent with abnormal  left ventricular relaxation (grade 1  diastolic dysfunction).  Doppler parameters are consistent with high ventricular filling  pressure.  - Pulmonary arteries: Systolic pressure was mildly increased.  - Pericardium, extracardiac: A trivial pericardial effusion was  identified.   Impressions:   - Normal LV systolic function; mild LVH; grade 1 diastolic  dysfunction; trace MR; mild TR; mildly elevated pulmonary  pressure.  Past Medical History:  Diagnosis Date  . ALLERGIC RHINITIS 07/02/2008  . Allergy   . Arthritis    RA  . Atrial fibrillation ([place])   . Cataract    removed both eyes   . CHF 06/19/2008  . COUGH DUE TO ACE INHIBITORS 10/05/2008  . DIABETES MELLITUS, TYPE II 09/22/2006  . Diabetic retinopathy of both eyes (HCC)    mild nonproliferative  . ED (erectile dysfunction)   . Glaucoma   . GOUT 09/22/2006  . Hx of adenomatous colonic polyps 06/09/2015  . HYPERCHOLESTEROLEMIA 07/02/2008  . HYPERTENSION 05/31/2009  . HYPOGONADISM, MALE 01/15/2007  . Hypokalemia   . 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 Lymphadenopathy   . Peripheral neuropathy   . PUD (peptic ulcer disease)   . Rheumatoid arthritis(714.0) 09/22/2006    Past Surgical History:  Procedure Laterality Date  . CATARACT EXTRACTION Bilateral    Dr. Talbert Forest  . CATARACT EXTRACTION, BILATERAL    . COLONOSCOPY  2004  . LYMPH NODE BIOPSY Right 10/16/2014   Procedure: RIGHT INGUINAL LYMPH NODE BIOPSY;  Surgeon: Aviva Signs, MD;  Location: [facility];  Service: General;  Laterality: Right;    MEDICATIONS: . Accu-Chek Softclix Lancets lancets  . Alcohol Swabs (B-D SINGLE USE SWABS REGULAR) PADS  . aspirin 81 MG tablet  . atorvastatin (LIPITOR) 10 MG tablet  . BD INSULIN SYRINGE ULTRAFINE 31G X 5/16" 0.5 ML MISC  . Blood Glucose Calibration (ACCU-CHEK AVIVA) SOLN  . Blood Glucose Monitoring Suppl (ACCU-CHEK AVIVA PLUS) w/Device KIT  . doxycycline (VIBRA-TABS) 100 MG tablet  . gabapentin (NEURONTIN) 300 MG capsule  . glucose blood (ACCU-CHEK  AVIVA PLUS) test strip  . ibuprofen (ADVIL) 200 MG tablet  . insulin NPH Human (HUMULIN N,NOVOLIN N) 100 UNIT/ML injection  . insulin regular (NOVOLIN R RELION) 100 units/mL injection  . losartan (COZAAR) 100 MG tablet  . MONOJECT ULTRA COMFORT SYRINGE 29G X 1/2" 1 ML MISC  . Tetrahydrozoline HCl (VISINE OP)   No current facility-administered medications for this encounter.    Maia Plan [facility] Pre-Surgical Testing [phone] 04/24/19 6:08 PM

## 2019-04-25 ENCOUNTER — Ambulatory Visit (HOSPITAL_COMMUNITY): Payer: Medicare HMO | Admitting: Anesthesiology

## 2019-04-25 ENCOUNTER — Ambulatory Visit (HOSPITAL_COMMUNITY): Payer: Medicare HMO | Admitting: Physician Assistant

## 2019-04-25 ENCOUNTER — Other Ambulatory Visit: Payer: Self-pay | Admitting: Podiatry

## 2019-04-25 ENCOUNTER — Encounter: Payer: Self-pay | Admitting: Podiatry

## 2019-04-25 ENCOUNTER — Encounter (HOSPITAL_COMMUNITY)
Admission: RE | Disposition: A | Discharge status: Home / Self Care | Payer: Self-pay | Source: Home / Self Care | Attending: Internal Medicine

## 2019-04-25 ENCOUNTER — Ambulatory Visit (HOSPITAL_COMMUNITY): Payer: Medicare HMO

## 2019-04-25 ENCOUNTER — Observation Stay (HOSPITAL_COMMUNITY)
Admission: RE | Admit: 2019-04-25 | Discharge: 2019-04-26 | Disposition: A | Discharge status: Home / Self Care | Payer: Medicare HMO | Attending: Internal Medicine | Admitting: Internal Medicine

## 2019-04-25 ENCOUNTER — Encounter (HOSPITAL_COMMUNITY): Payer: Self-pay | Admitting: Podiatry

## 2019-04-25 ENCOUNTER — Other Ambulatory Visit: Payer: Self-pay

## 2019-04-25 DIAGNOSIS — I110 Hypertensive heart disease with heart failure: Secondary | ICD-10-CM | POA: Diagnosis not present

## 2019-04-25 DIAGNOSIS — Z79899 Other long term (current) drug therapy: Secondary | ICD-10-CM | POA: Diagnosis not present

## 2019-04-25 DIAGNOSIS — Z794 Long term (current) use of insulin: Secondary | ICD-10-CM | POA: Insufficient documentation

## 2019-04-25 DIAGNOSIS — M86171 Other acute osteomyelitis, right ankle and foot: Secondary | ICD-10-CM | POA: Insufficient documentation

## 2019-04-25 DIAGNOSIS — E1042 Type 1 diabetes mellitus with diabetic polyneuropathy: Secondary | ICD-10-CM | POA: Diagnosis not present

## 2019-04-25 DIAGNOSIS — E10621 Type 1 diabetes mellitus with foot ulcer: Secondary | ICD-10-CM | POA: Diagnosis not present

## 2019-04-25 DIAGNOSIS — L97516 Non-pressure chronic ulcer of other part of right foot with bone involvement without evidence of necrosis: Secondary | ICD-10-CM | POA: Diagnosis not present

## 2019-04-25 DIAGNOSIS — M86679 Other chronic osteomyelitis, unspecified ankle and foot: Secondary | ICD-10-CM | POA: Diagnosis not present

## 2019-04-25 DIAGNOSIS — E785 Hyperlipidemia, unspecified: Secondary | ICD-10-CM | POA: Insufficient documentation

## 2019-04-25 DIAGNOSIS — I509 Heart failure, unspecified: Secondary | ICD-10-CM | POA: Diagnosis not present

## 2019-04-25 DIAGNOSIS — M216X1 Other acquired deformities of right foot: Secondary | ICD-10-CM | POA: Diagnosis not present

## 2019-04-25 DIAGNOSIS — M86671 Other chronic osteomyelitis, right ankle and foot: Secondary | ICD-10-CM

## 2019-04-25 DIAGNOSIS — E1069 Type 1 diabetes mellitus with other specified complication: Secondary | ICD-10-CM | POA: Diagnosis not present

## 2019-04-25 DIAGNOSIS — E1065 Type 1 diabetes mellitus with hyperglycemia: Secondary | ICD-10-CM | POA: Insufficient documentation

## 2019-04-25 DIAGNOSIS — M869 Osteomyelitis, unspecified: Secondary | ICD-10-CM | POA: Diagnosis present

## 2019-04-25 DIAGNOSIS — E10319 Type 1 diabetes mellitus with unspecified diabetic retinopathy without macular edema: Secondary | ICD-10-CM | POA: Diagnosis not present

## 2019-04-25 DIAGNOSIS — Z89431 Acquired absence of right foot: Secondary | ICD-10-CM | POA: Diagnosis not present

## 2019-04-25 DIAGNOSIS — Z87891 Personal history of nicotine dependence: Secondary | ICD-10-CM | POA: Diagnosis not present

## 2019-04-25 DIAGNOSIS — Z09 Encounter for follow-up examination after completed treatment for conditions other than malignant neoplasm: Secondary | ICD-10-CM

## 2019-04-25 DIAGNOSIS — Z833 Family history of diabetes mellitus: Secondary | ICD-10-CM | POA: Diagnosis not present

## 2019-04-25 DIAGNOSIS — Z7982 Long term (current) use of aspirin: Secondary | ICD-10-CM | POA: Diagnosis not present

## 2019-04-25 DIAGNOSIS — S98911A Complete traumatic amputation of right foot, level unspecified, initial encounter: Secondary | ICD-10-CM | POA: Diagnosis not present

## 2019-04-25 DIAGNOSIS — M216X9 Other acquired deformities of unspecified foot: Secondary | ICD-10-CM | POA: Diagnosis not present

## 2019-04-25 HISTORY — PX: AMPUTATION: SHX166

## 2019-04-25 HISTORY — PX: ACHILLES TENDON SURGERY: SHX542

## 2019-04-25 LAB — HIV ANTIBODY (ROUTINE TESTING W REFLEX): HIV Screen 4th Generation wRfx: NONREACTIVE

## 2019-04-25 LAB — GLUCOSE, CAPILLARY
Glucose-Capillary: 131 mg/dL — ABNORMAL HIGH (ref 70–99)
Glucose-Capillary: 125 mg/dL — ABNORMAL HIGH (ref 70–99)
Glucose-Capillary: 142 mg/dL — ABNORMAL HIGH (ref 70–99)
Glucose-Capillary: 164 mg/dL — ABNORMAL HIGH (ref 70–99)

## 2019-04-25 SURGERY — Surgical Case
Anesthesia: *Unknown

## 2019-04-25 SURGERY — AMPUTATION, FOOT, PARTIAL
Anesthesia: General | Site: Foot | Laterality: Right

## 2019-04-25 MED ORDER — CHLORHEXIDINE GLUCONATE CLOTH 2 % EX PADS: 6.0000 | MEDICATED_PAD | Freq: Once | CUTANEOUS | Status: DC

## 2019-04-25 MED ORDER — LIDOCAINE 2% (20 MG/ML) 5 ML SYRINGE
INTRAMUSCULAR | Status: DC | PRN
  Administered 2019-04-25: 60 mg via INTRAVENOUS

## 2019-04-25 MED ORDER — FENTANYL CITRATE (PF) 100 MCG/2ML IJ SOLN
INTRAMUSCULAR | Status: AC
  Filled 2019-04-25: qty 2

## 2019-04-25 MED ORDER — OXYCODONE-ACETAMINOPHEN 5-325 MG PO TABS
1.0000 | ORAL_TABLET | ORAL | Status: DC | PRN
  Administered 2019-04-26: 1 via ORAL
  Filled 2019-04-25: qty 1

## 2019-04-25 MED ORDER — LIDOCAINE 2% (20 MG/ML) 5 ML SYRINGE
INTRAMUSCULAR | Status: AC
  Filled 2019-04-25: qty 5

## 2019-04-25 MED ORDER — LOSARTAN POTASSIUM 50 MG PO TABS
100.0000 mg | ORAL_TABLET | Freq: Every day | ORAL | Status: DC
  Administered 2019-04-25: 100 mg via ORAL
  Filled 2019-04-25: qty 2

## 2019-04-25 MED ORDER — FENTANYL CITRATE (PF) 100 MCG/2ML IJ SOLN
INTRAMUSCULAR | Status: DC | PRN
  Administered 2019-04-25: 50 ug via INTRAVENOUS

## 2019-04-25 MED ORDER — HYDROMORPHONE HCL 1 MG/ML IJ SOLN: 0.2500 mg | INTRAMUSCULAR | Status: DC | PRN

## 2019-04-25 MED ORDER — MIDAZOLAM HCL 2 MG/2ML IJ SOLN
INTRAMUSCULAR | Status: DC | PRN
  Administered 2019-04-25: 2 mg via INTRAVENOUS

## 2019-04-25 MED ORDER — KETOROLAC TROMETHAMINE 15 MG/ML IJ SOLN: 15.0000 mg | Freq: Once | INTRAMUSCULAR | Status: DC

## 2019-04-25 MED ORDER — HEPARIN SODIUM (PORCINE) 5000 UNIT/ML IJ SOLN
5000.0000 [IU] | Freq: Three times a day (TID) | INTRAMUSCULAR | Status: DC
  Administered 2019-04-26: 5000 [IU] via SUBCUTANEOUS
  Filled 2019-04-25: qty 1

## 2019-04-25 MED ORDER — ONDANSETRON HCL 4 MG/2ML IJ SOLN
INTRAMUSCULAR | Status: AC
  Filled 2019-04-25: qty 2

## 2019-04-25 MED ORDER — LACTATED RINGERS IV SOLN: INTRAVENOUS | Status: DC

## 2019-04-25 MED ORDER — BUPIVACAINE HCL (PF) 0.5 % IJ SOLN
INTRAMUSCULAR | Status: AC
  Filled 2019-04-25: qty 30

## 2019-04-25 MED ORDER — LIDOCAINE HCL (PF) 1 % IJ SOLN
INTRAMUSCULAR | Status: AC
  Filled 2019-04-25: qty 30

## 2019-04-25 MED ORDER — PROPOFOL 10 MG/ML IV BOLUS
INTRAVENOUS | Status: DC | PRN
  Administered 2019-04-25: 150 mg via INTRAVENOUS
  Administered 2019-04-25: 30 mg via INTRAVENOUS

## 2019-04-25 MED ORDER — ONDANSETRON HCL 4 MG/2ML IJ SOLN
INTRAMUSCULAR | Status: DC | PRN
  Administered 2019-04-25: 4 mg via INTRAVENOUS

## 2019-04-25 MED ORDER — VANCOMYCIN HCL 1500 MG/300ML IV SOLN
1500.0000 mg | INTRAVENOUS | Status: AC
  Administered 2019-04-25: 1500 mg via INTRAVENOUS
  Filled 2019-04-25 (×2): qty 300

## 2019-04-25 MED ORDER — EPHEDRINE SULFATE-NACL 50-0.9 MG/10ML-% IV SOSY
PREFILLED_SYRINGE | INTRAVENOUS | Status: DC | PRN
  Administered 2019-04-25 (×3): 10 mg via INTRAVENOUS
  Administered 2019-04-25: 5 mg via INTRAVENOUS

## 2019-04-25 MED ORDER — MEPERIDINE HCL 50 MG/ML IJ SOLN: 6.2500 mg | INTRAMUSCULAR | Status: DC | PRN

## 2019-04-25 MED ORDER — INSULIN ASPART 100 UNIT/ML ~~LOC~~ SOLN
SUBCUTANEOUS | Status: AC
  Filled 2019-04-25: qty 1

## 2019-04-25 MED ORDER — ASPIRIN 81 MG PO CHEW
81.0000 mg | CHEWABLE_TABLET | Freq: Every day | ORAL | Status: DC
  Administered 2019-04-25 – 2019-04-26 (×2): 81 mg via ORAL
  Filled 2019-04-25 (×2): qty 1

## 2019-04-25 MED ORDER — ATORVASTATIN CALCIUM 10 MG PO TABS
10.0000 mg | ORAL_TABLET | Freq: Every day | ORAL | Status: DC
  Administered 2019-04-25 – 2019-04-26 (×2): 10 mg via ORAL
  Filled 2019-04-25 (×2): qty 1

## 2019-04-25 MED ORDER — INSULIN ASPART 100 UNIT/ML ~~LOC~~ SOLN
0.0000 [IU] | Freq: Three times a day (TID) | SUBCUTANEOUS | Status: DC
  Administered 2019-04-25: 2 [IU] via SUBCUTANEOUS
  Administered 2019-04-26 (×2): 3 [IU] via SUBCUTANEOUS

## 2019-04-25 MED ORDER — PROMETHAZINE HCL 25 MG/ML IJ SOLN: 6.2500 mg | INTRAMUSCULAR | Status: DC | PRN

## 2019-04-25 MED ORDER — TETRAHYDROZOLINE HCL 0.05 % OP SOLN
1.0000 [drp] | Freq: Every day | OPHTHALMIC | Status: DC | PRN
  Filled 2019-04-25: qty 15

## 2019-04-25 MED ORDER — MIDAZOLAM HCL 2 MG/2ML IJ SOLN
INTRAMUSCULAR | Status: AC
  Filled 2019-04-25: qty 2

## 2019-04-25 MED ORDER — VANCOMYCIN HCL 750 MG/150ML IV SOLN
750.0000 mg | Freq: Two times a day (BID) | INTRAVENOUS | Status: DC
  Administered 2019-04-25 – 2019-04-26 (×2): 750 mg via INTRAVENOUS
  Filled 2019-04-25 (×3): qty 150

## 2019-04-25 MED ORDER — GABAPENTIN 300 MG PO CAPS
300.0000 mg | ORAL_CAPSULE | Freq: Two times a day (BID) | ORAL | Status: DC
  Administered 2019-04-25 – 2019-04-26 (×2): 300 mg via ORAL
  Filled 2019-04-25 (×2): qty 1

## 2019-04-25 SURGICAL SUPPLY — 85 items
BAG SPEC THK2 15X12 ZIP CLS (MISCELLANEOUS) ×2
BAG ZIPLOCK 12X15 (MISCELLANEOUS) ×4 IMPLANT
BIT DRILL 2.8X128 (BIT) ×3 IMPLANT
BIT DRILL 2.8X128MM (BIT) ×1
BLADE AVERAGE 25MMX9MM (BLADE)
BLADE AVERAGE 25X9 (BLADE) IMPLANT
BLADE OSCILLATING/SAGITTAL (BLADE) ×4
BLADE SURG 15 STRL LF DISP TIS (BLADE) ×4 IMPLANT
BLADE SURG 15 STRL SS (BLADE) ×8
BLADE SURG SZ11 CARB STEEL (BLADE) ×4 IMPLANT
BLADE SW THK.38XMED LNG THN (BLADE) ×2 IMPLANT
BNDG CMPR 9X4 STRL LF SNTH (GAUZE/BANDAGES/DRESSINGS) ×2
BNDG CMPR MED 10X6 ELC LF (GAUZE/BANDAGES/DRESSINGS) ×2
BNDG ELASTIC 4X5.8 VLCR STR LF (GAUZE/BANDAGES/DRESSINGS) IMPLANT
BNDG ELASTIC 6X10 VLCR STRL LF (GAUZE/BANDAGES/DRESSINGS) ×2 IMPLANT
BNDG ELASTIC 6X5.8 VLCR STR LF (GAUZE/BANDAGES/DRESSINGS) ×4 IMPLANT
BNDG ESMARK 4X9 LF (GAUZE/BANDAGES/DRESSINGS) ×4 IMPLANT
BNDG GAUZE ELAST 4 BULKY (GAUZE/BANDAGES/DRESSINGS) ×4 IMPLANT
COTTON STERILE ROLL (GAUZE/BANDAGES/DRESSINGS) ×4 IMPLANT
COVER BACK TABLE 60X90IN (DRAPES) ×4 IMPLANT
COVER SURGICAL LIGHT HANDLE (MISCELLANEOUS) ×4 IMPLANT
COVER WAND RF STERILE (DRAPES) IMPLANT
CUFF TOURN SGL QUICK 18X4 (TOURNIQUET CUFF) IMPLANT
CUFF TOURN SGL QUICK 34 (TOURNIQUET CUFF) ×8
CUFF TRNQT CYL 34X4.125X (TOURNIQUET CUFF) ×4 IMPLANT
DRAPE EXTREMITY T 121X128X90 (DISPOSABLE) ×4 IMPLANT
DRAPE IMP U-DRAPE 54X76 (DRAPES) ×4 IMPLANT
DRAPE SHEET LG 3/4 BI-LAMINATE (DRAPES) ×16 IMPLANT
DRSG ADAPTIC 3X8 NADH LF (GAUZE/BANDAGES/DRESSINGS) ×2 IMPLANT
DRSG EMULSION OIL 3X3 NADH (GAUZE/BANDAGES/DRESSINGS) ×4 IMPLANT
DRSG PAD ABDOMINAL 8X10 ST (GAUZE/BANDAGES/DRESSINGS) ×4 IMPLANT
DURAPREP 26ML APPLICATOR (WOUND CARE) ×4 IMPLANT
ELECT REM PT RETURN 15FT ADLT (MISCELLANEOUS) ×4 IMPLANT
GAUZE 4X4 16PLY RFD (DISPOSABLE) IMPLANT
GAUZE SPONGE 4X4 12PLY STRL (GAUZE/BANDAGES/DRESSINGS) ×4 IMPLANT
GLOVE BIO SURGEON STRL SZ7.5 (GLOVE) ×8 IMPLANT
GLOVE BIOGEL M STRL SZ7.5 (GLOVE) ×4 IMPLANT
GLOVE BIOGEL PI IND STRL 7.5 (GLOVE) ×2 IMPLANT
GLOVE BIOGEL PI INDICATOR 7.5 (GLOVE) ×2
GOWN STRL REUS W/ TWL LRG LVL3 (GOWN DISPOSABLE) ×2 IMPLANT
GOWN STRL REUS W/ TWL XL LVL3 (GOWN DISPOSABLE) ×2 IMPLANT
GOWN STRL REUS W/TWL LRG LVL3 (GOWN DISPOSABLE) ×4
GOWN STRL REUS W/TWL XL LVL3 (GOWN DISPOSABLE) ×8 IMPLANT
HEMOSTAT SURGICEL 4X8 (HEMOSTASIS) ×2 IMPLANT
KIT BASIN OR (CUSTOM PROCEDURE TRAY) ×4 IMPLANT
KIT TURNOVER KIT A (KITS) IMPLANT
MANIFOLD NEPTUNE II (INSTRUMENTS) ×4 IMPLANT
NDL HYPO 25X1 1.5 SAFETY (NEEDLE) ×2 IMPLANT
NDL SAFETY ECLIPSE 18X1.5 (NEEDLE) IMPLANT
NEEDLE HYPO 18GX1.5 SHARP (NEEDLE)
NEEDLE HYPO 25X1 1.5 SAFETY (NEEDLE) ×4 IMPLANT
NS IRRIG 1000ML POUR BTL (IV SOLUTION) ×4 IMPLANT
PACK ORTHO EXTREMITY (CUSTOM PROCEDURE TRAY) ×4 IMPLANT
PAD CAST 4YDX4 CTTN HI CHSV (CAST SUPPLIES) ×4 IMPLANT
PADDING CAST ABS 4INX4YD NS (CAST SUPPLIES) ×2
PADDING CAST ABS COTTON 4X4 ST (CAST SUPPLIES) ×2 IMPLANT
PADDING CAST COTTON 4X4 STRL (CAST SUPPLIES) ×8
PADDING CAST COTTON 6X4 STRL (CAST SUPPLIES) IMPLANT
PASSER SUT SWANSON 36MM LOOP (INSTRUMENTS) ×4 IMPLANT
PENCIL SMOKE EVACUATOR (MISCELLANEOUS) ×4 IMPLANT
PROTECTOR NERVE ULNAR (MISCELLANEOUS) ×4 IMPLANT
SOL PREP POV-IOD 4OZ 10% (MISCELLANEOUS) ×4 IMPLANT
SOL PREP PROV IODINE SCRUB 4OZ (MISCELLANEOUS) ×4 IMPLANT
STAPLER VISISTAT (STAPLE) ×4 IMPLANT
STOCKINETTE 6  STRL (DRAPES) ×2
STOCKINETTE 6 STRL (DRAPES) ×2 IMPLANT
SUT BONE WAX W31G (SUTURE) ×4 IMPLANT
SUT ETHILON 4 0 PS 2 18 (SUTURE) ×8 IMPLANT
SUT FIBERWIRE #2 38 T-5 BLUE (SUTURE) ×8
SUT FIBERWIRE 4-0 18 DIAM BLUE (SUTURE) ×8
SUT MNCRL AB 3-0 PS2 18 (SUTURE) ×4 IMPLANT
SUT MNCRL AB 4-0 PS2 18 (SUTURE) ×8 IMPLANT
SUT PROLENE 3 0 PS 2 (SUTURE) ×8 IMPLANT
SUT SILK 2 0 (SUTURE) ×4
SUT SILK 2-0 18XBRD TIE 12 (SUTURE) ×2 IMPLANT
SUT VIC AB 2-0 CT2 27 (SUTURE) ×8 IMPLANT
SUTURE FIBERWR #2 38 T-5 BLUE (SUTURE) ×4 IMPLANT
SUTURE FIBERWR 4-0 18 DIA BLUE (SUTURE) ×4 IMPLANT
SYR 10ML LL (SYRINGE) IMPLANT
SYR BULB 3OZ (MISCELLANEOUS) ×4 IMPLANT
SYR CONTROL 10ML LL (SYRINGE) ×4 IMPLANT
TOWEL OR 17X26 10 PK STRL BLUE (TOWEL DISPOSABLE) ×4 IMPLANT
TRAY PREP A LATEX SAFE STRL (SET/KITS/TRAYS/PACK) ×4 IMPLANT
UNDERPAD 30X36 HEAVY ABSORB (UNDERPADS AND DIAPERS) ×4 IMPLANT
WATER STERILE IRR 1000ML POUR (IV SOLUTION) ×4 IMPLANT

## 2019-04-25 NOTE — Anesthesia Procedure Notes (Signed)
Procedure Name: LMA Insertion Date/Time: 04/25/2019 1:53 PM Performed by: Niel Hummer, CRNA Pre-anesthesia Checklist: Patient identified, Emergency Drugs available, Suction available and Patient being monitored Patient Re-evaluated:Patient Re-evaluated prior to induction Oxygen Delivery Method: Circle system utilized Preoxygenation: Pre-oxygenation with 100% oxygen Induction Type: IV induction LMA: LMA with gastric port inserted LMA Size: 5.0 Dental Injury: Teeth and Oropharynx as per pre-operative assessment

## 2019-04-25 NOTE — Op Note (Signed)
PATIENT:  Aaron Fox  64 y.o. male  PRE-OPERATIVE DIAGNOSIS:  OSTEOMYLETITIS AND OTHER DEFORMITIES OF FOOT  POST-OPERATIVE DIAGNOSIS:  OSTEOMYLETITIS AND OTHER DEFORMITIES OF FOOT  PROCEDURE:  Procedure(s): AMPUTATION FOOT,TRANSMETATARSAL (Right) ACHILLES LENGTHENING (Right)  SURGEON:  Surgeon(s) and Role:    * Trula Slade, DPM - Primary  PHYSICIAN ASSISTANT:   ASSISTANTS: none   ANESTHESIA:   general  EBL:  100cc   BLOOD ADMINISTERED:none  DRAINS: none   LOCAL MEDICATIONS USED:  OTHER 20cc lidocaine and marcaine plain  SPECIMEN:  Source of Specimen:  right forefoot for pathology  DISPOSITION OF SPECIMEN:  PATHOLOGY  COUNTS:  YES  TOURNIQUET:  * Missing tourniquet times found for documented tourniquets in log: [id number] *  DICTATION: .Viviann Spare Dictation  PLAN OF CARE: Admit to inpatient   PATIENT DISPOSITION:  PACU - hemodynamically stable.   Delay start of Pharmacological VTE agent (>24hrs) due to surgical blood loss or risk of bleeding: no  Indications for surgery: 64 year old male presented to the office with ulceration of the second toe the dorsal PIPJ.  He was found to have exposed bone and osteomyelitis of the second toe.  Given his previous amputations as well as second toe osteomyelitis recommended transmetatarsal amputation.  He also had equinus and due to this recommend the Achilles tendon lengthening.  We discussed the surgery as well as the postoperative course.  We discussed all alternatives, risks, complications.  No promises or guarantees were given, the procedure and all questions were answered to the best my ability.  Procedure in detail: The patient was both verbally and visually identified by myself, the nursing staff, the anesthesia staff in the preoperative holding area.  He was then transferred to the operating room via stretcher and placed on the operating table in supine position.  A calf tourniquet was applied but of  note this was not inflated during the procedure.  The right lower extremity was then scrubbed, prepped, draped in the normal sterile fashion.  Timeout was performed.  Incision was planned to the Achilles tendon for a triple hemisection.  Small stab incisions were made.  2 medial and 1 lateral starting from the mid substance parallel to the Achilles tendon turning the blade to the appropriate side.  After completion there was found to be improved range of motion of the Achilles tendon.  The Achilles tendon appear to be intact after lengthening.  Incisions were irrigated with saline.  Hemostasis achieved the incisions were closed with 4-0 Prolene.  Attention was then directed to the forefoot in which 2 symmetrical incisions were planned on the distal forefoot just proximal to the metatarsal heads.  Incisions were initially made with a 10 blade scalpel the skin.  I utilized electrocautery to further dissect down to bone as well as with a 10 blade scalpel.  Hemostasis was achieved as we proceeded with dissection.  At this time the 5 metatarsals were identified and a elevator was utilized to remove soft tissue.  A saw was utilized to resect metatarsals 1 through 5 in appropriate fashion.  At this time the plantar flap was dissected and disarticulated and forefoot.  At this time I removed tendons and hemostasis was achieved.  Upon evaluation of the wound there is no signs of purulence or abscess or any proximal infection.  The tissue appeared to be viable.  There did appear to be adequate bleeding.  I advised cautery as well as Surgicel (which was removed) to obtain hemostasis.  Once the wound was  irrigated and hemostasis was achieved the incision was closed with 3-0 Prolene as well as skin staples.  Adaptic was applied to the incisions followed by dry sterile dressing.  He was awoken from anesthesia and found to tolerate the procedure well without any complications.  He was transferred to PACU vital signs stable  and vascular status intact.  He will be admitted.  We will do vancomycin per pharmacy guidelines.  Nonweightbearing and PT consult.  I spoke to the hospitalist to have him admitted.  Celesta Gentile, DPM O: [phone] C: [phone]

## 2019-04-25 NOTE — H&P (Signed)
History and Physical    Aaron Fox:[id number] DOB: 10/30/1955 DOA: 04/25/2019  PCP: [name], MD   Patient coming from: Home  I have personally briefly reviewed patient's old medical records in [place]  Chief Complaint: I feel ok  HPI: Aaron Fox is a 64 y.o. male with medical history significant of IDDM, HTN, right foot osteomyelitis, DM neuropathy came to hospital today for right foot transmetatarsal amputation after failed outpatient conservative treatment for right second toe and forefoot osteomyelitis.  Reported surgical pain fairly controlled, no fever chills.  Being placed on overnight observation.  Patient admitted he has uncontrolled diabetes, most recent A1c 11.  He was supposed to see a endocrinologist, but delayed because of Covid pandemic.   Review of Systems: As per HPI otherwise 10 point review of systems negative.    Past Medical History:  Diagnosis Date  . ALLERGIC RHINITIS 07/02/2008  . Allergy   . Arthritis    RA  . Atrial fibrillation ([place])   . Cataract    removed both eyes   . CHF 06/19/2008  . COUGH DUE TO ACE INHIBITORS 10/05/2008  . DIABETES MELLITUS, TYPE II 09/22/2006  . Diabetic retinopathy of both eyes (HCC)    mild nonproliferative  . ED (erectile dysfunction)   . Glaucoma   . GOUT 09/22/2006  . Hx of adenomatous colonic polyps 06/09/2015  . HYPERCHOLESTEROLEMIA 07/02/2008  . HYPERTENSION 05/31/2009  . HYPOGONADISM, MALE 01/15/2007  . Hypokalemia   . 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 Lymphadenopathy   . Peripheral neuropathy   . PUD (peptic ulcer disease)   . Rheumatoid arthritis(714.0) 09/22/2006    Past Surgical History:  Procedure Laterality Date  . CATARACT EXTRACTION Bilateral    Dr. Talbert Forest  . CATARACT EXTRACTION, BILATERAL    . COLONOSCOPY  2004  . LYMPH NODE BIOPSY Right 10/16/2014   Procedure: RIGHT INGUINAL LYMPH NODE BIOPSY;  Surgeon: Aviva Signs, MD;  Location: [facility];  Service: General;   Laterality: Right;     reports that he quit smoking about 19 years ago. He has never used smokeless tobacco. He reports that he does not drink alcohol or use drugs.  Allergies  Allergen Reactions  . Penicillins Swelling    Swelling of the hands and feet Did it involve swelling of the face/tongue/throat, SOB, or low BP? No Did it involve sudden or severe rash/hives, skin peeling, or any reaction on the inside of your mouth or nose? No Did you need to seek medical attention at a hospital or doctor's office? Yes When did it last happen?10 + years If all above answers are "NO", may proceed with cephalosporin use.   . Terbinafine Hcl Hives  . Zestril [Lisinopril] Cough  . Ciprofloxacin Rash    Family History  Problem Relation Age of Onset  . Heart disease Other        FH of CAD  . Colon polyps Paternal Uncle   . Kidney disease Paternal Uncle   . Diabetes Paternal Grandfather   . Diabetes Maternal Uncle   . Cancer Neg Hx   . Colon cancer Neg Hx   . Gallbladder disease Neg Hx   . Esophageal cancer Neg Hx   . Rectal cancer Neg Hx   . Stomach cancer Neg Hx      Prior to Admission medications   Medication Sig Start Date End Date Taking? Authorizing Provider  Accu-Chek Softclix Lancets lancets Use to monitor glucose levels two times per  day; E10.49 06/03/18  Yes Renato Shin, MD  Alcohol Swabs (B-D SINGLE USE SWABS REGULAR) PADS Use to cleanse site prior to use of lancet to obtain droplet of blood two times per day; E10.49 06/03/18  Yes Renato Shin, MD  aspirin 81 MG tablet Take 81 mg by mouth daily.     Yes [provider]  atorvastatin (LIPITOR) 10 MG tablet Take 1 tablet (10 mg total) by mouth daily. 04/08/18  Yes Renato Shin, MD  BD INSULIN SYRINGE ULTRAFINE 31G X 5/16" 0.5 ML MISC Use with insulin. 04/05/16  Yes Renato Shin, MD  Blood Glucose Calibration (ACCU-CHEK AVIVA) SOLN 1 each by In Vitro route as needed. Use solution prn to evaluate accuracy of meter  06/03/18  Yes Renato Shin, MD  Blood Glucose Monitoring Suppl (ACCU-CHEK AVIVA PLUS) w/Device KIT 1 each by Does not apply route 2 (two) times daily. Use to monitor glucose levels two times per day; E10.49 06/03/18  Yes Renato Shin, MD  doxycycline (VIBRA-TABS) 100 MG tablet Take 1 tablet (100 mg total) by mouth 2 (two) times daily. 04/21/19  Yes Trula Slade, DPM  gabapentin (NEURONTIN) 300 MG capsule TAKE 1 CAPSULE TWICE DAILY Patient taking differently: Take 300 mg by mouth 2 (two) times daily.  01/17/19  Yes [name], MD  glucose blood (ACCU-CHEK AVIVA PLUS) test strip Use to monitor glucose levels two times per day; E10.49 06/03/18  Yes Renato Shin, MD  insulin NPH Human (HUMULIN N,NOVOLIN N) 100 UNIT/ML injection Inject 0.26 mLs (26 Units total) into the skin every morning. Patient taking differently: Inject 28 Units into the skin daily.  07/09/17  Yes Renato Shin, MD  insulin regular (NOVOLIN R RELION) 100 units/mL injection Inject 0.05 mLs (5 Units total) daily with supper into the skin. And syringes 2/day Patient taking differently: Inject 7 Units into the skin at bedtime.  01/08/17  Yes Renato Shin, MD  losartan (COZAAR) 100 MG tablet Take 1 tablet (100 mg total) by mouth daily. 07/09/17  Yes Renato Shin, MD  MONOJECT ULTRA COMFORT SYRINGE 29G X 1/2" 1 ML MISC USE TO INJECT INSULIN 4 TIMES PER DAY 02/06/17  Yes Renato Shin, MD  ibuprofen (ADVIL) 200 MG tablet Take 400 mg by mouth every 8 (eight) hours as needed for headache or moderate pain.    [provider]  Tetrahydrozoline HCl (VISINE OP) Place 1 drop into both eyes daily as needed (redness).    [provider]    Physical Exam: Vitals:   04/25/19 1645 04/25/19 1700 04/25/19 1730 04/25/19 1800  BP: 136/73 131/80 129/81 129/66  Pulse: 64 84 60 61  Resp: 17 18 14 14   Temp: (!) 97.5 F (36.4 C)     TempSrc:      SpO2: 100% 100% 100% 100%  Weight:      Height:        Constitutional: NAD,  calm, comfortable Vitals:   04/25/19 1645 04/25/19 1700 04/25/19 1730 04/25/19 1800  BP: 136/73 131/80 129/81 129/66  Pulse: 64 84 60 61  Resp: 17 18 14 14   Temp: (!) 97.5 F (36.4 C)     TempSrc:      SpO2: 100% 100% 100% 100%  Weight:      Height:       Eyes: PERRL, lids and conjunctivae normal ENMT: Mucous membranes are moist. Posterior pharynx clear of any exudate or lesions.Normal dentition.  Neck: normal, supple, no masses, no thyromegaly Respiratory: clear to auscultation bilaterally, no wheezing,  no crackles. Normal respiratory effort. No accessory muscle use.  Cardiovascular: Regular rate and rhythm, no murmurs / rubs / gallops. No extremity edema. 2+ pedal pulses. No carotid bruits.  Abdomen: no tenderness, no masses palpated. No hepatosplenomegaly. Bowel sounds positive.  Musculoskeletal: no clubbing / cyanosis. No joint deformity upper and lower extremities. Good ROM, no contractures. Normal muscle tone.  Skin: Right foot in surgical dressing and [place] Neurologic: CN 2-12 grossly intact. Sensation intact, DTR normal. Strength 5/5 in all 4.  Psychiatric: Normal judgment and insight. Alert and oriented x 3. Normal mood.     Labs on Admission: I have personally reviewed following labs and imaging studies  CBC: Recent Labs  Lab 04/21/19 1313  WBC 4.0  NEUTROABS 1,824  HGB 12.5*  HCT 37.1*  MCV 83.2  PLT 427   Basic Metabolic Panel: Recent Labs  Lab 04/21/19 1313  NA 140  K 5.2  CL 108  CO2 23  GLUCOSE 118  BUN 23  CREATININE 1.04  CALCIUM 9.4   GFR: Estimated Creatinine Clearance: 88.9 mL/min (by C-G formula based on SCr of 1.04 mg/dL). Liver Function Tests: Recent Labs  Lab 04/21/19 1313  AST 44*  ALT 23  BILITOT 0.7  PROT 8.0   No results for input(s): LIPASE, AMYLASE in the last 168 hours. No results for input(s): AMMONIA in the last 168 hours. Coagulation Profile: No results for input(s): INR, PROTIME in the last 168 hours. Cardiac  Enzymes: No results for input(s): CKTOTAL, CKMB, CKMBINDEX, TROPONINI in the last 168 hours. BNP (last 3 results) No results for input(s): PROBNP in the last 8760 hours. HbA1C: No results for input(s): HGBA1C in the last 72 hours. CBG: Recent Labs  Lab 04/24/19 1430 04/25/19 1151 04/25/19 1515 04/25/19 1749  GLUCAP 78 131* 125* 142*   Lipid Profile: No results for input(s): CHOL, HDL, LDLCALC, TRIG, CHOLHDL, LDLDIRECT in the last 72 hours. Thyroid Function Tests: No results for input(s): TSH, T4TOTAL, FREET4, T3FREE, THYROIDAB in the last 72 hours. Anemia Panel: No results for input(s): VITAMINB12, FOLATE, FERRITIN, TIBC, IRON, RETICCTPCT in the last 72 hours. Urine analysis:    Component Value Date/Time   COLORURINE YELLOW 10/15/2017 0818   APPEARANCEUR CLEAR 10/15/2017 0818   LABSPEC 1.025 10/15/2017 0818   PHURINE 6.0 10/15/2017 0818   GLUCOSEU NEGATIVE 10/15/2017 0818   HGBUR TRACE-INTACT (A) 10/15/2017 0818   BILIRUBINUR NEGATIVE 10/15/2017 0818   KETONESUR NEGATIVE 10/15/2017 0818   PROTEINUR NEGATIVE 06/16/2008 0418   UROBILINOGEN 0.2 10/15/2017 0818   NITRITE NEGATIVE 10/15/2017 0818   LEUKOCYTESUR NEGATIVE 10/15/2017 0818    Radiological Exams on Admission: DG Foot 2 Views Right  Result Date: 04/25/2019 CLINICAL DATA:  Amputation EXAM: RIGHT FOOT - 2 VIEW COMPARISON:  09/19/2018 FINDINGS: There has been interval postsurgical change from transmetatarsal amputation of the right foot involving the first through fifth rays. Resection margins are unremarkable. No areas of cortical destruction or periostitis are seen. Expected postoperative changes within the soft tissues of the distal stump. No acute fractures. IMPRESSION: Interval transmetatarsal amputation of the right foot. Electronically Signed   By: Davina Poke D.O.   On: 04/25/2019 16:35    EKG: None  Assessment/Plan Active Problems:   Osteomyelitis ([place])  Right foot osteomyelitis status post  transmetatarsal amputation Discussed with podiatry attending, patient on vancomycin as per podiatry.  Nonweightbearing on right foot and PT evaluation. Podiatrist estimated about 100 150 mL blood loss, will check CBC. Likely can be discharged tomorrow.  Uncontrolled  diabetes Sliding scale for now, suspect patient has insulin resistance, recommend outpatient endocrinology follow-up.  Hypertension Continue home meds  Hyperlipidemia On statin.   DVT prophylaxis: Heparin subcu start from tomorrow Code Status: Full code Family Communication: None at bedside Disposition Plan: CBC remained stable can be discharged home tomorrow after PT evaluation Consults called: Dr. Earleen Newport Admission status: MedSurg observation   Lequita Halt MD [facility] Pager [phone]    04/25/2019, 6:25 PM

## 2019-04-25 NOTE — Progress Notes (Signed)
Awaiting transfer to 1323 on hold since 1645. Ordered supper.  CBG 142 @ 1750, 2 unit novolog coverage given to Rt upper abd.

## 2019-04-25 NOTE — Transfer of Care (Signed)
Immediate Anesthesia Transfer of Care Note  Patient: Aaron Fox  Procedure(s) Performed: AMPUTATION FOOT,TRANSMETATARSAL (Right Foot) ACHILLES LENGTHENING (Right )  Patient Location: PACU  Anesthesia Type:General  Level of Consciousness: awake, alert  and oriented  Airway & Oxygen Therapy: Patient Spontanous Breathing and Patient connected to face mask oxygen  Post-op Assessment: Report given to RN, Post -op Vital signs reviewed and stable and Patient moving all extremities X 4  Post vital signs: Reviewed and stable  Last Vitals:  Vitals Value Taken Time  BP    Temp    Pulse 78 04/25/19 1514  Resp 16 04/25/19 1514  SpO2 100 % 04/25/19 1514  Vitals shown include unvalidated device data.  Last Pain:  Vitals:   04/25/19 1129  TempSrc: Oral  PainSc: 0-No pain      Patients Stated Pain Goal: 3 ([id number])  Complications: No apparent anesthesia complications

## 2019-04-25 NOTE — Progress Notes (Signed)
Knee scooter ordered and sent a message to the vendor.

## 2019-04-25 NOTE — H&P (Signed)
Patient presents to [facility] for surgery of the right foot due to osteomyelitis. On exam today there is edema to the entire 2nd digit and there is exposed bone at the proximal phalanx. There is no pus. No ascending cellulitis. No fluctuation, crepitation or malodor.   He had arterial studies preformed. I spoke with Dr. Gwenlyn Found in regards to the exam and he felt that "ABIs are normal, tibial vessels are monophasic but still patent. I think he can proceed with a TMA". We will monitor this closely for any healing issue postop.   A1c was up to 11.4. He followed up with his PCP yesterday. He is keeping track of his blood sugars now 3 times a day. Seems to be more controlled over the last few weeks apparently.   Will plan for right TMA and Achilles tendon lengthening as scheduled. Again discussed all alternatives, risks, complication. He has no further questions or concerns.   Will talk to his wife postop.   Admit postop likely. Hopeful discharge Saturday.   Celesta Gentile, DPM O: [phone] C: [phone]

## 2019-04-25 NOTE — Plan of Care (Signed)
Plan of care 

## 2019-04-25 NOTE — Progress Notes (Signed)
Inpatient Diabetes Program Recommendations  AACE/ADA: New Consensus Statement on Inpatient Glycemic Control (2015)  Target Ranges:  Prepandial:   less than 140 mg/dL      Peak postprandial:   less than 180 mg/dL (1-2 hours)      Critically ill patients:  140 - 180 mg/dL   Lab Results  Component Value Date   GLUCAP 131 (H) 04/25/2019   HGBA1C 11.4 (H) 04/21/2019    Review of Glycemic Control  Diabetes history: DM2 Outpatient Diabetes medications: NPH 28 units QD and Novolin R 5 units with supper Current orders for Inpatient glycemic control: None  HgbA1C - 11.4%. Saw PCP regarding his diabetes control yesterday. Pt has started checking blood sugars and eating healthier, leaving off sodas and sweets, about 3 weeks ago. Blood sugars have been great. MD hesitates to change his insulin regimen at this point. Will f/u with PCP and send blood sugars to him.  Glucose 131 this am.  Inpatient Diabetes Program Recommendations:     Add NPH 15 units QHS Add Novolog 0-15 units tidwc and hs  Will follow.  Thank you. Lorenda Peck, RD, LDN, CDE Inpatient Diabetes Coordinator [phone]

## 2019-04-25 NOTE — Brief Op Note (Signed)
04/25/2019  3:07 PM  PATIENT:  Aaron Fox  64 y.o. male  PRE-OPERATIVE DIAGNOSIS:  OSTEOMYLETITIS AND OTHER DEFORMITIES OF FOOT  POST-OPERATIVE DIAGNOSIS:  OSTEOMYLETITIS AND OTHER DEFORMITIES OF FOOT  PROCEDURE:  Procedure(s): AMPUTATION FOOT,TRANSMETATARSAL (Right) ACHILLES LENGTHENING (Right)  SURGEON:  Surgeon(s) and Role:    * Trula Slade, DPM - Primary  PHYSICIAN ASSISTANT:   ASSISTANTS: none   ANESTHESIA:   general  EBL:  100cc   BLOOD ADMINISTERED:none  DRAINS: none   LOCAL MEDICATIONS USED:  OTHER 20cc lidocaine and marcaine plain  SPECIMEN:  Source of Specimen:  right forefoot for pathology  DISPOSITION OF SPECIMEN:  PATHOLOGY  COUNTS:  YES  TOURNIQUET:  * Missing tourniquet times found for documented tourniquets in log: [id number] *  DICTATION: .Viviann Spare Dictation  PLAN OF CARE: Admit to inpatient   PATIENT DISPOSITION:  PACU - hemodynamically stable.   Delay start of Pharmacological VTE agent (>24hrs) due to surgical blood loss or risk of bleeding: no   Intraoperative findings: Underwent achilles tendon lengthening. After triple hemisection the achilles was intact but was able to get improved dorsiflexion of the ankle. Underwent TMA due to osteomyelitis of the 2nd toe and previous amputations. No signs of proximal infection. No abscess or purulence identified. There did seem to be adequate bleeding. Incision closed without tension. Will be NWB post-op. Admit for overnight stay and likely plan to discharge tomorrow morning. Would continue antibiotics overnight. Received vancomycin preop.   Celesta Gentile, DPM O: [phone] C: [phone]

## 2019-04-25 NOTE — Anesthesia Preprocedure Evaluation (Addendum)
Anesthesia Evaluation  Patient identified by MRN, date of birth, ID band Patient awake    Reviewed: Allergy & Precautions, NPO status , Patient's Chart, lab work & pertinent test results  Airway Mallampati: II       Dental  (+) Edentulous Upper   Pulmonary former smoker,    Pulmonary exam normal        Cardiovascular hypertension, Pt. on medications Normal cardiovascular exam Rhythm:Regular Rate:Normal     Neuro/Psych  Neuromuscular disease negative psych ROS   GI/Hepatic Neg liver ROS,   Endo/Other  negative endocrine ROSdiabetes, Poorly Controlled, Type 1, Insulin Dependent  Renal/GU negative Renal ROS  negative genitourinary   Musculoskeletal   Abdominal (+) + obese,   Peds negative pediatric ROS (+)  Hematology  (+) Blood dyscrasia, anemia ,   Anesthesia Other Findings   Reproductive/Obstetrics negative OB ROS                             Anesthesia Physical Anesthesia Plan  ASA: III  Anesthesia Plan: General   Post-op Pain Management:    Induction: Intravenous  PONV Risk Score and Plan: Ondansetron, Propofol infusion and Midazolam  Airway Management Planned: LMA  Additional Equipment: None  Intra-op Plan:   Post-operative Plan: Extubation in OR  Informed Consent: I have reviewed the patients History and Physical, chart, labs and discussed the procedure including the risks, benefits and alternatives for the proposed anesthesia with the patient or authorized representative who has indicated his/her understanding and acceptance.     Dental advisory given  Plan Discussed with: CRNA  Anesthesia Plan Comments:        Anesthesia Quick Evaluation

## 2019-04-25 NOTE — Anesthesia Postprocedure Evaluation (Signed)
Anesthesia Post Note  Patient: Aaron Fox  Procedure(s) Performed: AMPUTATION FOOT,TRANSMETATARSAL (Right Foot) ACHILLES LENGTHENING (Right )     Patient location during evaluation: PACU Anesthesia Type: General Level of consciousness: awake and alert Pain management: pain level controlled Vital Signs Assessment: post-procedure vital signs reviewed and stable Respiratory status: spontaneous breathing, nonlabored ventilation, respiratory function stable and patient connected to nasal cannula oxygen Cardiovascular status: blood pressure returned to baseline and stable Postop Assessment: no apparent nausea or vomiting Anesthetic complications: no    Last Vitals:  Vitals:   04/25/19 1530 04/25/19 1545  BP: 138/72 (!) 141/81  Pulse: 72 (!) 55  Resp: 16 14  Temp:    SpO2: 100% 100%    Last Pain:  Vitals:   04/25/19 1514  TempSrc:   PainSc: 0-No pain                 [name] DANIEL

## 2019-04-25 NOTE — Progress Notes (Signed)
Pharmacy Antibiotic Note  Aaron Fox is a 64 y.o. male admitted on 04/25/2019 with osteomyelitis.  Pharmacy has been consulted for vancomycin dosing.  Plan:  Vancomycin 1500 mg IV now, then 750 mg IV q12 hr (est AUC 425 based on SCr 1.04; Vd 0.5)  Measure vancomycin AUC at steady state as indicated  SCr q48 while on vanc   Height: 5\' 11"  (180.3 cm) Weight: 227 lb 8.2 oz (103.2 kg) IBW/kg (Calculated) : 75.3  Temp (24hrs), Avg:97.7 F (36.5 C), Min:97.5 F (36.4 C), Max:98.1 F (36.7 C)  Recent Labs  Lab 04/21/19 1313  WBC 4.0  CREATININE 1.04    Estimated Creatinine Clearance: 88.9 mL/min (by C-G formula based on SCr of 1.04 mg/dL).    Allergies  Allergen Reactions  . Penicillins Swelling    Swelling of the hands and feet Did it involve swelling of the face/tongue/throat, SOB, or low BP? No Did it involve sudden or severe rash/hives, skin peeling, or any reaction on the inside of your mouth or nose? No Did you need to seek medical attention at a hospital or doctor's office? Yes When did it last happen?10 + years If all above answers are "NO", may proceed with cephalosporin use.   . Terbinafine Hcl Hives  . Zestril [Lisinopril] Cough  . Ciprofloxacin Rash     Thank you for allowing pharmacy to be a part of this patient's care.  Nickolaus Bordelon A 04/25/2019 4:40 PM

## 2019-04-26 ENCOUNTER — Other Ambulatory Visit: Payer: Self-pay | Admitting: Podiatry

## 2019-04-26 DIAGNOSIS — L97516 Non-pressure chronic ulcer of other part of right foot with bone involvement without evidence of necrosis: Secondary | ICD-10-CM | POA: Diagnosis not present

## 2019-04-26 DIAGNOSIS — E1069 Type 1 diabetes mellitus with other specified complication: Secondary | ICD-10-CM | POA: Diagnosis not present

## 2019-04-26 DIAGNOSIS — I110 Hypertensive heart disease with heart failure: Secondary | ICD-10-CM | POA: Diagnosis not present

## 2019-04-26 DIAGNOSIS — M216X1 Other acquired deformities of right foot: Secondary | ICD-10-CM | POA: Diagnosis not present

## 2019-04-26 DIAGNOSIS — I509 Heart failure, unspecified: Secondary | ICD-10-CM | POA: Diagnosis not present

## 2019-04-26 DIAGNOSIS — M86671 Other chronic osteomyelitis, right ankle and foot: Secondary | ICD-10-CM | POA: Diagnosis not present

## 2019-04-26 DIAGNOSIS — E10621 Type 1 diabetes mellitus with foot ulcer: Secondary | ICD-10-CM | POA: Diagnosis not present

## 2019-04-26 DIAGNOSIS — E785 Hyperlipidemia, unspecified: Secondary | ICD-10-CM | POA: Diagnosis not present

## 2019-04-26 DIAGNOSIS — M86171 Other acute osteomyelitis, right ankle and foot: Secondary | ICD-10-CM | POA: Diagnosis not present

## 2019-04-26 DIAGNOSIS — M869 Osteomyelitis, unspecified: Secondary | ICD-10-CM | POA: Diagnosis not present

## 2019-04-26 DIAGNOSIS — E1065 Type 1 diabetes mellitus with hyperglycemia: Secondary | ICD-10-CM | POA: Diagnosis not present

## 2019-04-26 LAB — CBC WITH DIFFERENTIAL/PLATELET
Abs Immature Granulocytes: 0.01 10*3/uL (ref 0.00–0.07)
Basophils Absolute: 0 10*3/uL (ref 0.0–0.1)
Basophils Relative: 1 %
Eosinophils Absolute: 0.2 10*3/uL (ref 0.0–0.5)
Eosinophils Relative: 5 %
HCT: 30.6 % — ABNORMAL LOW (ref 39.0–52.0)
Hemoglobin: 10.2 g/dL — ABNORMAL LOW (ref 13.0–17.0)
Immature Granulocytes: 0 %
Lymphocytes Relative: 20 %
Lymphs Abs: 1 10*3/uL (ref 0.7–4.0)
MCH: 28.5 pg (ref 26.0–34.0)
MCHC: 33.3 g/dL (ref 30.0–36.0)
MCV: 85.5 fL (ref 80.0–100.0)
Monocytes Absolute: 0.6 10*3/uL (ref 0.1–1.0)
Monocytes Relative: 12 %
Neutro Abs: 3.2 10*3/uL (ref 1.7–7.7)
Neutrophils Relative %: 62 %
Platelets: 168 10*3/uL (ref 150–400)
RBC: 3.58 MIL/uL — ABNORMAL LOW (ref 4.22–5.81)
RDW: 13.1 % (ref 11.5–15.5)
WBC: 5.1 10*3/uL (ref 4.0–10.5)
nRBC: 0 % (ref 0.0–0.2)

## 2019-04-26 LAB — BASIC METABOLIC PANEL
Anion gap: 7 (ref 5–15)
BUN: 20 mg/dL (ref 8–23)
CO2: 23 mmol/L (ref 22–32)
Calcium: 8.8 mg/dL — ABNORMAL LOW (ref 8.9–10.3)
Chloride: 107 mmol/L (ref 98–111)
Creatinine, Ser: 1.02 mg/dL (ref 0.61–1.24)
GFR calc Af Amer: >60 mL/min (ref >60)
GFR calc non Af Amer: >60 mL/min (ref >60)
Glucose, Bld: 154 mg/dL — ABNORMAL HIGH (ref 70–99)
Potassium: 4.3 mmol/L (ref 3.5–5.1)
Sodium: 137 mmol/L (ref 135–145)

## 2019-04-26 LAB — GLUCOSE, CAPILLARY
Glucose-Capillary: 155 mg/dL — ABNORMAL HIGH (ref 70–99)
Glucose-Capillary: 173 mg/dL — ABNORMAL HIGH (ref 70–99)

## 2019-04-26 MED ORDER — OXYCODONE-ACETAMINOPHEN 5-325 MG PO TABS: 1.0000 | ORAL_TABLET | Freq: Four times a day (QID) | ORAL | 0 refills | Status: DC | PRN

## 2019-04-26 MED ORDER — LOSARTAN POTASSIUM 50 MG PO TABS: 50.0000 mg | ORAL_TABLET | Freq: Every day | ORAL | 0 refills | Status: DC

## 2019-04-26 MED ORDER — LOSARTAN POTASSIUM 50 MG PO TABS: 50.0000 mg | ORAL_TABLET | Freq: Every day | ORAL | Status: DC

## 2019-04-26 MED ORDER — LOSARTAN POTASSIUM 50 MG PO TABS
50.0000 mg | ORAL_TABLET | Freq: Every day | ORAL | Status: DC
  Administered 2019-04-26: 50 mg via ORAL
  Filled 2019-04-26: qty 1

## 2019-04-26 MED ORDER — DOXYCYCLINE HYCLATE 100 MG PO TABS: 100.0000 mg | ORAL_TABLET | Freq: Two times a day (BID) | ORAL | 0 refills | Status: AC

## 2019-04-26 NOTE — TOC Progression Note (Addendum)
Transition of Care [facility]) - Progression Note    Patient Details  Name: Aaron Fox MRN: [id number] Date of Birth: 22-Aug-1955  Transition of Care [facility]) CM/SW Contact  Joaquin Courts, RN Phone Number: 04/26/2019, 2:07 PM  Clinical Narrative:    CM delivered rolling walker to bedside.  CM explained to patient the knee scooter is not covered by insurance but is available for rent from [employer] at a cost of 100$ per month or [place] discount medical supply.  Neither store is open on the weekend and if patient wishes for a knee scooter he can get one of rent at the store on Monday.  Patient is not interested in a knee scooter in light of this and is agreeable to receive rolling walker.    Expected Discharge Plan: Home/Self Care Barriers to Discharge: No Barriers Identified  Expected Discharge Plan and Services Expected Discharge Plan: Home/Self Care   Discharge Planning Services: CM Consult   Living arrangements for the past 2 months: Single Family Home Expected Discharge Date: 04/26/19               DME Arranged: Gilford Rile rolling DME Agency: [employer] Date DME Agency Contacted: 04/26/19 Time DME Agency Contacted: [phone] Representative spoke with at DME Agency: CM delivered [place] and left referral info for rep HH Arranged: NA [place] Agency: NA         Social Determinants of Health (SDOH) Interventions    Readmission Risk Interventions No flowsheet data found.

## 2019-04-26 NOTE — Plan of Care (Signed)
  Problem: Clinical Measurements: Goal: Respiratory complications will improve Outcome: Progressing   Problem: Clinical Measurements: Goal: Cardiovascular complication will be avoided Outcome: Progressing   Problem: Activity: Goal: Risk for activity intolerance will decrease Outcome: Progressing   Problem: Nutrition: Goal: Adequate nutrition will be maintained Outcome: Progressing   Problem: Coping: Goal: Level of anxiety will decrease Outcome: Progressing   

## 2019-04-26 NOTE — Evaluation (Addendum)
Physical Therapy Evaluation Patient Details Name: Aaron Fox MRN: [id number] DOB: 03-Jan-1956 Today's Date: 04/26/2019   History of Present Illness  64 yo male admitted with osteomyelitis. S/P transmet amputation/achilles lengthening 2/26.  Clinical Impression  On eval, pt required Min assist for mobility. He walked ~75 feet x 2 with crutches vs RW. He also practiced stair negotiation with use of crutches. He did well with adhering to NWB precaution. Min-Mod pain with activity. No further questions/concerns from pt. All education completed. Okay to d/c from PT standpoint.     Follow Up Recommendations No PT follow up;Supervision for mobility/OOB    Equipment Recommendations  None recommended by PT(crutches already in room)    Recommendations for Other Services       Precautions / Restrictions Precautions Precautions: Fall Restrictions Weight Bearing Restrictions: Yes RLE Weight Bearing: Non weight bearing      Mobility  Bed Mobility Overal bed mobility: Modified Independent                Transfers Overall transfer level: Needs assistance Equipment used: Rolling walker (2 wheeled);Crutches Transfers: Sit to/from Stand Sit to Stand: Min assist         General transfer comment: Small amount of assist to steady with crutches. No assist given with RW. VCs safety, techique, hand/LE placement.  Ambulation/Gait Ambulation/Gait assistance: Min assist Gait Distance (Feet): 75 Feet(x2) Assistive device: Rolling walker (2 wheeled);Crutches Gait Pattern/deviations: Step-to pattern     General Gait Details: Intermittent assist to lightly steady with crutch use. VCs safety, techique, sequence, proper use of RW vs crutches, pacing, step length. Seated rest break taken between walks  Stairs Stairs: Yes Stairs assistance: Min assist Stair Management: Step to pattern;Forwards;With crutches Number of Stairs: 1 General stair comments: VCs safety, technique, sequence,  R LE placement when going up vs down (NWB). Assist to steady.  Wheelchair Mobility    Modified Rankin (Stroke Patients Only)       Balance Overall balance assessment: Needs assistance         Standing balance support: Bilateral upper extremity supported Standing balance-Leahy Scale: Fair                               Pertinent Vitals/Pain Pain Assessment: 0-10 Pain Score: 5  Pain Location: R lower leg/foot Pain Descriptors / Indicators: Aching;Discomfort;Sore Pain Intervention(s): Monitored during session;Repositioned(R LE elevated)    Home Living Family/patient expects to be discharged to:: Private residence Living Arrangements: Children;Spouse/significant other Available Help at Discharge: Family Type of Home: House Home Access: Stairs to enter   Technical brewer of Steps: 1+1 Home Layout: One level Home Equipment: Crutches      Prior Function Level of Independence: Independent               Hand Dominance        Extremity/Trunk Assessment   Upper Extremity Assessment Upper Extremity Assessment: Overall WFL for tasks assessed    Lower Extremity Assessment Lower Extremity Assessment: Overall WFL for tasks assessed;RLE deficits/detail RLE Deficits / Details: able to SLR Ind. Splint on lower leg/foot    Cervical / Trunk Assessment Cervical / Trunk Assessment: Normal  Communication   Communication: No difficulties  Cognition Arousal/Alertness: Awake/alert Behavior During Therapy: WFL for tasks assessed/performed Overall Cognitive Status: Within Functional Limits for tasks assessed  General Comments      Exercises     Assessment/Plan    PT Assessment Patient needs continued PT services  PT Problem List Decreased mobility;Decreased balance;Pain;Decreased activity tolerance;Decreased knowledge of use of DME       PT Treatment Interventions DME instruction;Gait  training;Therapeutic activities;Therapeutic exercise;Patient/family education;Balance training;Functional mobility training;Stair training    PT Goals (Current goals can be found in the Care Plan section)  Acute Rehab PT Goals Patient Stated Goal: home PT Goal Formulation: With patient Time For Goal Achievement: 05/10/19 Potential to Achieve Goals: Good    Frequency Min 3X/week   Barriers to discharge        Co-evaluation               AM-PAC PT "6 Clicks" Mobility  Outcome Measure Help needed turning from your back to your side while in a flat bed without using bedrails?: A Little Help needed moving from lying on your back to sitting on the side of a flat bed without using bedrails?: A Little Help needed moving to and from a bed to a chair (including a wheelchair)?: A Little Help needed standing up from a chair using your arms (e.g., wheelchair or bedside chair)?: A Little Help needed to walk in hospital room?: A Little Help needed climbing 3-5 steps with a railing? : A Little 6 Click Score: 18    End of Session Equipment Utilized During Treatment: Gait belt Activity Tolerance: Patient tolerated treatment well Patient left: in chair;with call bell/phone within reach   PT Visit Diagnosis: Unsteadiness on feet (R26.81);Pain;Difficulty in walking, not elsewhere classified (R26.2) Pain - Right/Left: Right Pain - part of body: Ankle and joints of foot;Leg    Time: [id number] PT Time Calculation (min) (ACUTE ONLY): 27 min   Charges:   PT Evaluation $PT Eval Low Complexity: 1 Low PT Treatments $Gait Training: 8-22 mins           Doreatha Massed, PT Acute Rehabilitation

## 2019-04-26 NOTE — Progress Notes (Signed)
Subjective: POD #1 s/p right TMA and achilles tendon lengthening. No acute overnight events. He is having minimal discomfort but otherwise doing well. He just received pain medication. He feels the boot is causing some pressure to the leg. Denies any systemic complaints such as fevers, chills, nausea, vomiting. No chest pain, shortness of breath.   Objective: AAO x3, NAD Bandage clean, dry, intact without any drainage on the bandage. There is minimal warmth to the leg compared to the contralateral extremity but no obvious signs of cellulitis. There is no pain with calf compression. No erythema to the leg. No open lesions appreciated.   Assessment: POD #1 s/p right TMA, Achilles tendon lengthening  Plan: Overall he is doing well. The boot is causing discomfort due to the bulkly dressing. I contacted the ortho tech and he is going to apply a posterior splint. Patient can go home with the boot and I will apply it next week in the office. Continue Vancomycin while inpatient and likely discharge on doxycycline. WBC stable. Tmax is 99.4. I rechecked the temperature myself and it was 98.7. Denies any systemic symptoms. As long as he remains afebrile he can be discharged today pending physical therapy evaluation. He is NWB. Continue to elevate. I will have him follow up in the office on Monday or Tuesday. We will contact him for an appointment. Discussed with him strict return precautions and to call if there are any issues.   Celesta Gentile, DPM O: [phone] C: [phone]

## 2019-04-26 NOTE — Progress Notes (Signed)
Pain medication sent to pharmacy for postop use.

## 2019-04-26 NOTE — Progress Notes (Signed)
Pt stable at time of d/c instructions and education. Pt provided with home equipment prior to d/c. Pt right lower leg splint within normal limits. Pt had no questions at time of d/c. rn will monitor pt until his ride arrives.

## 2019-04-27 NOTE — Discharge Summary (Signed)
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 Coli Multiple allergic reactions with antibiotics Uncontrolled diabetes mellitus type 1  Active Problems:   Osteomyelitis ([place])   Admitted From: Home Disposition:  Home   Recommendations for Outpatient Follow-up:  1. PCP: Follow-up with PCP in 1 week, follow-up with endocrinology as well as follow-up with Dr. Jacqualyn Posey Monday 2. Follow up LABS/TEST: None  Follow-up Information    [name], MD. Schedule an appointment as soon as possible for a visit in 1 week(s).   Specialty: Family Medicine Contact information: [address] [place] [address] [phone]        Renato Shin, MD. Schedule an appointment as soon as possible for a visit in 1 month(s).   Specialty: Endocrinology Contact information: [address] [place] [address] [phone]          Diet recommendation: Carb modified diet  Activity: The patient is advised to gradually reintroduce usual activities, as tolerated  Discharge Condition: stable  Code Status: Full code   History of present illness: As per the H and P dictated on admission, "Aaron Fox is a 64 y.o. male with medical history significant of IDDM, HTN, right foot osteomyelitis, DM neuropathy came to hospital today for right foot transmetatarsal amputation after failed outpatient conservative treatment for right second toe and forefoot osteomyelitis.  Reported surgical pain fairly controlled, no fever chills.  Being placed on overnight observation.  Patient admitted he has uncontrolled diabetes, most recent A1c 11.  He was supposed to see a endocrinologist, but delayed because of  Covid pandemic."  Hospital Course:  Summary of his active problems in the hospital is as following. Right foot osteomyelitis. SP transmetatarsal amputation. Prior history of E. coli in the same wound. Multiple drug allergies. Patient was brought into the hospital by podiatry for transmetatarsal amputation. Following the procedure patient was referred for admission for IV antibiotics. Patient was started on IV vancomycin. Cultures so far are negative. Patient's prior culture grew E. coli. Patient is unable to tolerate penicillin due to severe swelling with exfoliating skin reaction, unable to tolerate Cipro due to generalized body hives and rash and unable to tolerate Bactrim due to severe headache. Options are limited for now for covering E. coli. Patient did well with clindamycin in the past. Currently per podiatry wound appears to be cleared of any active infection therefore they are comfortable with oral doxycycline. Patient will follow up with podiatry and may require ID consultation if remains to have active infection. Postop pain control per podiatry appreciate their assistance.  Type 1 diabetes mellitus. Uncontrolled with hyperglycemia with neuropathy. Continue home regimen. Currently hemoglobin A1c improving. Unable to see his endocrinology as the patient has not been able to contact them due to pandemic. Recommend to call them and have a follow-up appointment as soon as possible. Continue current regimen as well as gabapentin for neuropathy.  Essential hypertension. Blood pressure is soft. Losartan dose reduced 100 mg to 50 mg. Follow-up with PCP regarding dose adjustment.  Hyperlipidemia. Continue statin.  PVD. ABIs are normal. Continue aspirin.  Body mass index is 31.73 kg/m.   Patient was seen by physical therapy, who recommended no therapy needed on discharge. On  the day of the discharge the patient's vitals were stable, and no other acute medical condition  were reported by patient. the patient was felt safe to be discharge at Home with no therapy needed on discharge.  Consultants: Podiatry Procedures: Transmetatarsal amputation  Discharge Exam: General: Appear in no distress, no Rash; Oral Mucosa Clear, moist. Cardiovascular: S1 and S2 Present, no Murmur, Respiratory: normal respiratory effort, Bilateral Air entry present and no Crackles, no wheezes Abdomen: Bowel Sound present, Soft and no tenderness, no hernia Extremities: Right leg wrapped, left no Pedal edema, no calf tenderness Neurology: alert and oriented to time, place, and person affect appropriate.  Filed Weights   04/25/19 1129  Weight: 103.2 kg   Vitals:   04/26/19 0535 04/26/19 1004  BP: 105/66 108/67  Pulse: 79 79  Resp: 18 18  Temp: 99.4 F (37.4 C) (!) 97.1 F (36.2 C)  SpO2: 100% 99%    DISCHARGE MEDICATION: Allergies as of 04/26/2019      Reactions   Penicillins Swelling   Swelling of the hands and feet, skin peeling Did it involve swelling of the face/tongue/throat, SOB, or low BP? No Did it involve sudden or severe rash/hives, skin peeling, or any reaction on the inside of your mouth or nose? No Did you need to seek medical attention at a hospital or doctor's office? Yes When did it last happen?10 + years If all above answers are "NO", may proceed with cephalosporin use.   Ciprofloxacin Hives, Rash   Terbinafine Hcl Hives   Zestril [lisinopril] Cough      Medication List    TAKE these medications   Accu-Chek Aviva Plus w/Device Kit 1 each by Does not apply route 2 (two) times daily. Use to monitor glucose levels two times per day; E10.49   Accu-Chek Aviva Soln 1 each by In Vitro route as needed. Use solution prn to evaluate accuracy of meter   Accu-Chek Softclix Lancets lancets Use to monitor glucose levels two times per day; E10.49   aspirin 81 MG tablet Take 81 mg by mouth daily.   atorvastatin 10 MG tablet Commonly known as:  LIPITOR Take 1 tablet (10 mg total) by mouth daily.   B-D SINGLE USE SWABS REGULAR Pads Use to cleanse site prior to use of lancet to obtain droplet of blood two times per day; E10.49   BD Insulin Syringe Ultrafine 31G X 5/16" 0.5 ML Misc Generic drug: Insulin Syringe-Needle U-100 Use with insulin.   Monoject Ultra Comfort Syringe 29G X 1/2" 1 ML Misc Generic drug: INSULIN SYRINGE 1CC/29G USE TO INJECT INSULIN 4 TIMES PER DAY   doxycycline 100 MG tablet Commonly known as: VIBRA-TABS Take 1 tablet (100 mg total) by mouth 2 (two) times daily for 14 days.   gabapentin 300 MG capsule Commonly known as: NEURONTIN TAKE 1 CAPSULE TWICE DAILY   glucose blood test strip Commonly known as: Accu-Chek Aviva Plus Use to monitor glucose levels two times per day; E10.49   ibuprofen 200 MG tablet Commonly known as: ADVIL Take 400 mg by mouth every 8 (eight) hours as needed for headache or moderate pain.   insulin NPH Human 100 UNIT/ML injection Commonly known as: NOVOLIN N Inject 0.26 mLs (26 Units total) into the skin every morning. What changed:   how much to take  when to take this   insulin regular 100 units/mL injection Commonly known as: NovoLIN R ReliOn Inject 0.05 mLs (5 Units total) daily with supper into the skin. And syringes 2/day  What changed:   how much to take  when to take this  additional instructions   losartan 50 MG tablet Commonly known as: COZAAR Take 1 tablet (50 mg total) by mouth daily. What changed:   medication strength  how much to take   oxyCODONE-acetaminophen 5-325 MG tablet Commonly known as: PERCOCET/ROXICET Take 1-2 tablets by mouth every 6 (six) hours as needed for severe pain.   VISINE OP Place 1 drop into both eyes daily as needed (redness).      Allergies  Allergen Reactions  . Penicillins Swelling    Swelling of the hands and feet, skin peeling Did it involve swelling of the face/tongue/throat, SOB, or low BP? No Did it  involve sudden or severe rash/hives, skin peeling, or any reaction on the inside of your mouth or nose? No Did you need to seek medical attention at a hospital or doctor's office? Yes When did it last happen?10 + years If all above answers are "NO", may proceed with cephalosporin use.   . Ciprofloxacin Hives and Rash  . Terbinafine Hcl Hives  . Zestril [Lisinopril] Cough   Discharge Instructions    Diet - low sodium heart healthy   Complete by: As directed    Discharge instructions   Complete by: As directed    It is important that you read the given instructions as well as go over your medication list with RN to help you understand your care after this hospitalization.  Please follow-up with PCP in 1-2 weeks.  Please note that NO REFILLS for any discharge medications will be authorized once you are discharged, as it is imperative that you return to your primary care physician (or establish a relationship with a primary care physician if you do not have one) for your aftercare needs so that they can reassess your need for medications and monitor your lab values.  Please request your primary care physician to go over all Hospital Tests and Procedure/Radiological results at the follow up. Please get all Hospital records sent to your PCP by signing hospital release before you go home.   Do not drive, operating heavy machinery, perform activities at heights, swimming or participation in water activities or provide baby sitting services while you are on Pain, Sleep and Anxiety Medications; until you have been seen by Primary Care Physician or a Neurologist and are cleared to do such activities.  Do not take more than prescribed Pain, Sleep and Anxiety Medications.  You were cared for by a hospitalist during your hospital stay. If you have any questions about your discharge medications or the care you received while you were in the hospital after you are discharged, you can call the unit  '@UNIT'$ @ you were admitted to and ask to speak with the hospitalist Berle Mull. Ask for Hospitalist on call if the hospitalist that took care of you is not available.   Once you are discharged, your primary care physician will handle any further medical issues.  You Must read complete instructions/literature along with all the possible adverse reactions/side effects for all the Medicines you take and that have been prescribed to you. Take any new Medicines after you have completely understood and accept all the possible adverse reactions/side effects.  If you have smoked or chewed Tobacco in the last 2 yrs please STOP smoking , if you have used any Recreational drug please stop its use.  If you drink alcohol, please safely reduce the use. Do not drive, operating heavy machinery, perform activities  at heights, swimming or participation in water activities or provide baby sitting services under influence.  Wear Seat belts while driving.   Increase activity slowly   Complete by: As directed       The results of significant diagnostics from this hospitalization (including imaging, microbiology, ancillary and laboratory) are listed below for reference.    Significant Diagnostic Studies: DG Foot 2 Views Right  Result Date: 04/25/2019 CLINICAL DATA:  Amputation EXAM: RIGHT FOOT - 2 VIEW COMPARISON:  09/19/2018 FINDINGS: There has been interval postsurgical change from transmetatarsal amputation of the right foot involving the first through fifth rays. Resection margins are unremarkable. No areas of cortical destruction or periostitis are seen. Expected postoperative changes within the soft tissues of the distal stump. No acute fractures. IMPRESSION: Interval transmetatarsal amputation of the right foot. Electronically Signed   By: Davina Poke D.O.   On: 04/25/2019 16:35   DG Foot Complete Left  Result Date: 04/16/2019 Please see detailed radiograph report in office note.  DG Foot Complete  Right  Result Date: 04/16/2019 Please see detailed radiograph report in office note.  VAS [place] ABI WITH/WO TBI  Result Date: 04/24/2019 LOWER EXTREMITY DOPPLER STUDY Indications: Claudication, gangrene, and Patient has an active ulcer on the              dorsal aspect of right 2nd toe. For about one year he indicates he              has intermittent pain from knees to calves after walking up              inclines or stairs after about 10 minutes. He denies any rest pain. High Risk         Hypertension, hyperlipidemia, Diabetes, past history of Factors:          smoking. Other Factors: SEE BILATERAL LOWER LEG ARTERIAL DUPLEX REPORT                 Right 1st toe amputated 2020, right 3rd toe amputated 2018.  Comparison Study: Most recent ABI done 03/16/2016 showed right 1.22 left 1.33 Performing Technologist: Alecia Mackin RVT, RDCS (AE), RDMS  Examination Guidelines: A complete evaluation includes at minimum, Doppler waveform signals and systolic blood pressure reading at the level of bilateral brachial, anterior tibial, and posterior tibial arteries, when vessel segments are accessible. Bilateral testing is considered an integral part of a complete examination. Photoelectric Plethysmograph (PPG) waveforms and toe systolic pressure readings are included as required and additional duplex testing as needed. Limited examinations for reoccurring indications may be performed as noted.  ABI Findings: +---------+------------------+-----+----------------+-----------------+ Right    Rt Pressure (mmHg)IndexWaveform        Comment           +---------+------------------+-----+----------------+-----------------+ Brachial 152                                                      +---------+------------------+-----+----------------+-----------------+ ATA      157               1.02 biphasic                          +---------+------------------+-----+----------------+-----------------+ PTA      187  1.21 monophasic                        +---------+------------------+-----+----------------+-----------------+ PERO     137               0.89 barely triphasic                  +---------+------------------+-----+----------------+-----------------+ Great Toe                                       1st toe amputated +---------+------------------+-----+----------------+-----------------+ +---------+------------------+-----+----------+-------+ Left     Lt Pressure (mmHg)IndexWaveform  Comment +---------+------------------+-----+----------+-------+ Brachial 154                                      +---------+------------------+-----+----------+-------+ ATA      117               0.76 monophasic        +---------+------------------+-----+----------+-------+ PTA      154               1.00 triphasic         +---------+------------------+-----+----------+-------+ PERO     120               0.78 monophasic        +---------+------------------+-----+----------+-------+ Great Toe75                0.49 Abnormal          +---------+------------------+-----+----------+-------+ +-------+-----------+-----------+------------+------------+ ABI/TBIToday's ABIToday's TBIPrevious ABIPrevious TBI +-------+-----------+-----------+------------+------------+ Right  1.21                  1.22        .71          +-------+-----------+-----------+------------+------------+ Left   1.00       .49        1.33        .88          +-------+-----------+-----------+------------+------------+ TOES Findings: +----------+---------------+--------+---------+ Right ToesPressure (mmHg)WaveformComment   +----------+---------------+--------+---------+ 1st Digit                        amputated +----------+---------------+--------+---------+ 2nd Digit                Abnormal          +----------+---------------+--------+---------+ 3rd Digit                         amputated +----------+---------------+--------+---------+ 4th Digit                Abnormal          +----------+---------------+--------+---------+ 5th Digit                Abnormal          +----------+---------------+--------+---------+  +---------+---------------+--------+-------+ Left ToesPressure (mmHg)WaveformComment +---------+---------------+--------+-------+ 1st Digit               Abnormal        +---------+---------------+--------+-------+ 2nd Digit               Abnormal        +---------+---------------+--------+-------+ 3rd Digit               Abnormal        +---------+---------------+--------+-------+ 4th Digit  Abnormal        +---------+---------------+--------+-------+ 5th Digit               Abnormal        +---------+---------------+--------+-------+   Right ABIs appear essentially unchanged. Left ABIs and TBIs appear decreased.  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 1st toe is amputated, therefore TBI not obtainable. Left: The left toe-brachial index is abnormal. 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  *See table(s) above for measurements and observations.  Electronically signed by Kathlyn Sacramento MD on 04/24/2019 at 1:01:11 PM.    Final    VAS [place] LOWER EXTREMITY ARTERIAL DUPLEX  Result Date: 04/24/2019 LOWER EXTREMITY ARTERIAL DUPLEX STUDY Indications: Claudication, ulceration, and Patient has an active ulcer on the              dorsal aspect of right 2nd toe. For about one year he indicates he              has intermittent pain from knees to calves after walking up              inclines or stairs after about 10 minutes. He denies any rest pain. Other Factors: SEE ABI REPORT                  Right 1st toe amputated 2020, right 3rd toe amputated 2018.  Current ABI: right 1.21 left 1.00 Comparison Study: None Performing Technologist: Alecia Mackin RVT, RDCS (AE), RDMS  Examination Guidelines: A complete evaluation includes B-mode imaging, spectral Doppler, color Doppler, and power Doppler as needed of all accessible portions of each vessel. Bilateral testing is considered an integral part of a complete examination. Limited examinations for reoccurring indications may be performed as noted.  +-----------+--------+-----+---------------+----------+--------------+ RIGHT      PSV cm/sRatioStenosis       Waveform  Comments       +-----------+--------+-----+---------------+----------+--------------+ CFA Prox   156                         triphasic                +-----------+--------+-----+---------------+----------+--------------+ CFA Distal 139                         triphasic                +-----------+--------+-----+---------------+----------+--------------+ DFA        144                         triphasic                +-----------+--------+-----+---------------+----------+--------------+ SFA Prox   164                         triphasic                +-----------+--------+-----+---------------+----------+--------------+ SFA Mid    205          30-49% stenosistriphasic high end range +-----------+--------+-----+---------------+----------+--------------+ SFA Distal 148                         triphasic                +-----------+--------+-----+---------------+----------+--------------+  POP Prox   110                         triphasic                +-----------+--------+-----+---------------+----------+--------------+ POP Distal 73                          triphasic                +-----------+--------+-----+---------------+----------+--------------+ TP Trunk   104                         triphasic                 +-----------+--------+-----+---------------+----------+--------------+ ATA Prox   66                          biphasic                 +-----------+--------+-----+---------------+----------+--------------+ ATA Mid    75                          monophasic               +-----------+--------+-----+---------------+----------+--------------+ ATA Distal 55                          monophasic               +-----------+--------+-----+---------------+----------+--------------+ PTA Prox   72                          triphasic                +-----------+--------+-----+---------------+----------+--------------+ PTA Mid    85                          monophasic               +-----------+--------+-----+---------------+----------+--------------+ PTA Distal 75                          monophasic               +-----------+--------+-----+---------------+----------+--------------+ PERO Prox  57                          monophasic               +-----------+--------+-----+---------------+----------+--------------+ PERO Mid   75                          monophasic               +-----------+--------+-----+---------------+----------+--------------+ PERO Distal53                          monophasic               +-----------+--------+-----+---------------+----------+--------------+ A focal velocity elevation of 205 cm/s was obtained at MID SFA with a VR of 1.6. Findings are characteristic of 30-49% stenosis. Right pedal artery acceleration time 71 ms, category 1.  +-----------+--------+-----+---------------+----------+--------+ LEFT       PSV cm/sRatioStenosis       Waveform  Comments +-----------+--------+-----+---------------+----------+--------+ CFA Prox   154                         triphasic          +-----------+--------+-----+---------------+----------+--------+ CFA Distal 138                         triphasic           +-----------+--------+-----+---------------+----------+--------+ DFA        286                         triphasic          +-----------+--------+-----+---------------+----------+--------+ SFA Prox   130                         triphasic          +-----------+--------+-----+---------------+----------+--------+ SFA Mid    383          50-74% stenosistriphasic          +-----------+--------+-----+---------------+----------+--------+ SFA Distal 97                          triphasic          +-----------+--------+-----+---------------+----------+--------+ POP Prox   71                          triphasic          +-----------+--------+-----+---------------+----------+--------+ POP Distal 59                          triphasic          +-----------+--------+-----+---------------+----------+--------+ TP Trunk   83                          triphasic          +-----------+--------+-----+---------------+----------+--------+ ATA Prox   87                          biphasic           +-----------+--------+-----+---------------+----------+--------+ ATA Mid    29                          monophasic         +-----------+--------+-----+---------------+----------+--------+ ATA Distal 26                          monophasic         +-----------+--------+-----+---------------+----------+--------+ PTA Prox   66                          triphasic          +-----------+--------+-----+---------------+----------+--------+ PTA Mid    57                          monophasic         +-----------+--------+-----+---------------+----------+--------+ PTA Distal 70                          monophasic         +-----------+--------+-----+---------------+----------+--------+ PERO Prox  39  triphasic          +-----------+--------+-----+---------------+----------+--------+ PERO Mid   42                          monophasic          +-----------+--------+-----+---------------+----------+--------+ PERO Distal39                          monophasic         +-----------+--------+-----+---------------+----------+--------+ A focal velocity elevation of 383 cm/s was obtained at MID SFA with a VR of 3.1. Findings are characteristic of 50-74% stenosis.  Summary: Right: High end range 30-49% stenosis noted in the superficial femoral artery. There is moderate wall calcifications and plaque seen throughout arterial system. Left: 50-74% stenosis noted in the superficial femoral artery. There is moderate wall calcifications and plaque seen throughout arterial system.  See table(s) above for measurements and observations.  Electronically signed by Kathlyn Sacramento MD on 04/24/2019 at 1:05:04 PM.    Final     Microbiology: Recent Results (from the past 240 hour(s))  SARS CORONAVIRUS 2 (TAT 6-24 HRS) Nasopharyngeal Nasopharyngeal Swab     Status: None   Collection Time: 04/22/19  6:34 AM   Specimen: Nasopharyngeal Swab  Result Value Ref Range Status   SARS Coronavirus 2 NEGATIVE NEGATIVE Final    Comment: (NOTE) SARS-CoV-2 target nucleic acids are NOT DETECTED. The SARS-CoV-2 RNA is generally detectable in upper and lower respiratory specimens during the acute phase of infection. Negative results do not preclude SARS-CoV-2 infection, do not rule out co-infections with other pathogens, and should not be used as the sole basis for treatment or other patient management decisions. Negative results must be combined with clinical observations, patient history, and epidemiological information. The expected result is Negative. Fact Sheet for Patients: SugarRoll.be Fact Sheet for Healthcare Providers: https://www.woods-mathews.com/ This test is not yet approved or cleared by the [place] FDA and  has been authorized for detection and/or diagnosis of SARS-CoV-2 by FDA under an Emergency Use  Authorization (EUA). This EUA will remain  in effect (meaning this test can be used) for the duration of the COVID-19 declaration under Section 56 4(b)(1) of the Act, 21 U.S.C. section 360bbb-3(b)(1), unless the authorization is terminated or revoked sooner. Performed at [facility], [place] [address] [place], [place] [address]      Labs: CBC: Recent Labs  Lab 04/21/19 1313 04/26/19 0229  WBC 4.0 5.1  NEUTROABS 1,824 3.2  HGB 12.5* 10.2*  HCT 37.1* 30.6*  MCV 83.2 85.5  PLT 229 846   Basic Metabolic Panel: Recent Labs  Lab 04/21/19 1313 04/26/19 0229  NA 140 137  K 5.2 4.3  CL 108 107  CO2 23 23  GLUCOSE 118 154*  BUN 23 20  CREATININE 1.04 1.02  CALCIUM 9.4 8.8*   Liver Function Tests: Recent Labs  Lab 04/21/19 1313  AST 44*  ALT 23  BILITOT 0.7  PROT 8.0   No results for input(s): LIPASE, AMYLASE in the last 168 hours. No results for input(s): AMMONIA in the last 168 hours. Cardiac Enzymes: No results for input(s): CKTOTAL, CKMB, CKMBINDEX, TROPONINI in the last 168 hours. BNP (last 3 results) No results for input(s): BNP in the last 8760 hours. CBG: Recent Labs  Lab 04/25/19 1515 04/25/19 1749 04/25/19 2119 04/26/19 0743 04/26/19 1211  GLUCAP 125* 142* 164* 155* 173*    Time spent: 35 minutes  Signed:  Berle Mull  [facility] 04/26/2019 5:18 PM

## 2019-04-28 ENCOUNTER — Other Ambulatory Visit: Payer: Self-pay | Admitting: Podiatry

## 2019-04-28 ENCOUNTER — Ambulatory Visit: Payer: Medicare HMO | Admitting: Podiatry

## 2019-04-28 ENCOUNTER — Telehealth: Payer: Self-pay | Admitting: *Deleted

## 2019-04-28 ENCOUNTER — Telehealth: Payer: Self-pay | Admitting: Podiatry

## 2019-04-28 LAB — SURGICAL PATHOLOGY

## 2019-04-28 NOTE — Telephone Encounter (Signed)
Called patient and left a message for the patient to call me back at the [place] office at [phone] and or can go thru my chart and leave a message for Dr Jacqualyn Posey. Lattie Haw

## 2019-04-28 NOTE — Telephone Encounter (Signed)
Called pt to get him scheduled for his first postop tomorrow with Dr. Jacqualyn Posey per Dr. Leigh Aurora request. Asked pt how he was doing and if he was having any severe pain, nausea, swelling, or bleeding. Pt states the pain medication is making him very nauseous. Pt states he uses the [facility] [phone] to pick up medicine he needs right away.

## 2019-04-28 NOTE — Telephone Encounter (Signed)
I called patient back. He is doing ok. Nauseous with pain medication but otherwise doing well. No fevers or chills. Offered to change pain medication or send phenergan but he wants to wait until the morning to see how he is doing.

## 2019-04-29 ENCOUNTER — Encounter: Payer: Self-pay | Admitting: Podiatry

## 2019-04-29 ENCOUNTER — Ambulatory Visit (INDEPENDENT_AMBULATORY_CARE_PROVIDER_SITE_OTHER): Payer: Medicare HMO | Admitting: Podiatry

## 2019-04-29 ENCOUNTER — Other Ambulatory Visit: Payer: Self-pay

## 2019-04-29 VITALS — BP 125/82 | HR 82 | Temp 97.7°F

## 2019-04-29 DIAGNOSIS — M869 Osteomyelitis, unspecified: Secondary | ICD-10-CM

## 2019-04-29 DIAGNOSIS — Z9889 Other specified postprocedural states: Secondary | ICD-10-CM

## 2019-04-29 NOTE — Progress Notes (Signed)
Subjective: Aaron Fox is a 64 y.o. is seen today in office s/p right TMA and Achilles tendon lengthening preformed on 04/25/2019. They state their pain has improved and he is not taking any pain medication.  Currently no nausea as he is not taking pain medication.  He did want a knee scooter which he is using today.  No recent injury or falls. Denies any systemic complaints such as fevers, chills, nausea, vomiting. No calf pain, chest pain, shortness of breath.   Objective: General: No acute distress, AAOx3  DP/PT pulses palpable , CRT < 3 sec to all digits.  RIGHT foot: Incision is well coapted without any evidence of dehiscence with sutures and staples intact. There is no surrounding erythema, ascending cellulitis, fluctuance, crepitus, malodor, drainage/purulence. There is mild edema around the surgical site. There is minimal pain along the surgical site.  No other areas of tenderness to bilateral lower extremities.  No other open lesions or pre-ulcerative lesions.  No pain with calf compression, swelling, warmth, erythema.   Assessment and Plan:  Status post right transmetatarsal imitation, Achilles tendon lengthening, doing well with no complications   -Treatment options discussed including all alternatives, risks, and complications -Incision is healing well without any signs of infection or dehiscence.  Small amount of antibiotic ointment was applied followed by dry sterile dressing.  Keep the dressing clean, dry, intact -Remain nonweightbearing in cam boot -Elevate -Pain medication as needed-he can use Tylenol -Monitor for any clinical signs or symptoms of infection and DVT/PE and directed to call the office immediately should any occur or go to the ER. -Follow-up in 1 week or sooner if any problems arise. In the meantime, encouraged to call the office with any questions, concerns, change in symptoms.   Celesta Gentile, DPM

## 2019-04-29 NOTE — Telephone Encounter (Signed)
Called the patient yesterday and the patient stated that he was doing ok over all and would see Dr Jacqualyn Posey 04-29-2019 for his follow up appointment. Lattie Haw

## 2019-05-05 ENCOUNTER — Other Ambulatory Visit: Payer: Self-pay

## 2019-05-05 ENCOUNTER — Encounter: Payer: Medicare HMO | Admitting: Podiatry

## 2019-05-05 ENCOUNTER — Encounter: Payer: Self-pay | Admitting: Family Medicine

## 2019-05-05 ENCOUNTER — Ambulatory Visit (INDEPENDENT_AMBULATORY_CARE_PROVIDER_SITE_OTHER): Payer: Medicare HMO | Admitting: Family Medicine

## 2019-05-05 VITALS — BP 124/66 | HR 80 | Temp 98.0°F | Resp 14 | Ht 71.5 in | Wt 226.0 lb

## 2019-05-05 DIAGNOSIS — IMO0002 Reserved for concepts with insufficient information to code with codable children: Secondary | ICD-10-CM

## 2019-05-05 DIAGNOSIS — Z794 Long term (current) use of insulin: Secondary | ICD-10-CM

## 2019-05-05 DIAGNOSIS — E11628 Type 2 diabetes mellitus with other skin complications: Secondary | ICD-10-CM | POA: Diagnosis not present

## 2019-05-05 DIAGNOSIS — E1165 Type 2 diabetes mellitus with hyperglycemia: Secondary | ICD-10-CM

## 2019-05-05 LAB — CBC WITH DIFFERENTIAL/PLATELET
Absolute Monocytes: 509 cells/uL (ref 200–950)
Basophils Absolute: 68 cells/uL (ref 0–200)
Basophils Relative: 1.5 %
Eosinophils Absolute: 410 cells/uL (ref 15–500)
Eosinophils Relative: 9.1 %
HCT: 31.2 % — ABNORMAL LOW (ref 38.5–50.0)
Hemoglobin: 10.5 g/dL — ABNORMAL LOW (ref 13.2–17.1)
Lymphs Abs: 1886 cells/uL (ref 850–3900)
MCH: 28.2 pg (ref 27.0–33.0)
MCHC: 33.7 g/dL (ref 32.0–36.0)
MCV: 83.6 fL (ref 80.0–100.0)
MPV: 11 fL (ref 7.5–12.5)
Monocytes Relative: 11.3 %
Neutro Abs: 1629 cells/uL (ref 1500–7800)
Neutrophils Relative %: 36.2 %
Platelets: 244 10*3/uL (ref 140–400)
RBC: 3.73 10*6/uL — ABNORMAL LOW (ref 4.20–5.80)
RDW: 13.2 % (ref 11.0–15.0)
Total Lymphocyte: 41.9 %
WBC: 4.5 10*3/uL (ref 3.8–10.8)

## 2019-05-05 LAB — BASIC METABOLIC PANEL WITHOUT GFR
BUN: 18 mg/dL (ref 7–25)
CO2: 23 mmol/L (ref 20–32)
Calcium: 9.4 mg/dL (ref 8.6–10.3)
Chloride: 108 mmol/L (ref 98–110)
Creat: 1.1 mg/dL (ref 0.70–1.25)
GFR, Est African American: 82 mL/min/1.73m2
GFR, Est Non African American: 71 mL/min/1.73m2
Glucose, Bld: 71 mg/dL (ref 65–99)
Potassium: 4.4 mmol/L (ref 3.5–5.3)
Sodium: 141 mmol/L (ref 135–146)

## 2019-05-05 MED ORDER — GABAPENTIN 300 MG PO CAPS: 600.0000 mg | ORAL_CAPSULE | Freq: Three times a day (TID) | ORAL | 2 refills | Status: DC

## 2019-05-05 NOTE — Progress Notes (Signed)
Subjective:    Patient ID: Aaron Fox, male    DOB: [birth year], 64 y.o.   MRN: [id number]  HPI 04/24/19 Patient establish care with me in August of last year.  This is been a only encounter.  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  His most recent hemoglobin A1c was 11.  He states that he has not seen his endocrinologist in more than a year primarily due to the Covid pandemic.  He admits that up until 3 weeks ago he was not managing his sugars.  He was not checking his sugars although he states that he was being compliant with his insulin.  He is currently taking Novolin in 28 units in the morning.  He takes regular insulin 7 units with supper.  He has been checking his blood sugars 3 times a day for the last 3 weeks.  He states that his sugars are now well controlled.  His fasting blood sugars are between 80 and 120 2 in the morning.  His evening blood sugars are between 120 and 166.  He denies any blood sugars greater than 200.  He denies any hypoglycemic episodes.  I asked the patient what changed and he states that he is now eating better and avoiding soda and bread and cakes and cookies.  However again he states that he was being compliant in the way he was taking his insulin.  At that time, my plan was: I explained to the patient that his hemoglobin A1c would suggest an average blood sugar between 280 and 300 over the last 3 months.  Therefore his insulin regimen is not sufficiently controlling his blood sugars.  I explained to the patient that this would only lead to future complications from his diabetes.  However over the last 3 weeks his blood sugars are essentially perfect.  Therefore I do feel unsafe increasing his insulin since his reported sugars are well within control.  Therefore of asked the patient to check his blood sugars 3 times a day, fasting in the morning,  2 hours after lunch, and 2 hours after supper.  I will review these values in 1 week and further titrate his insulin accordingly to achieve fasting blood sugars between 80 and 130 and 2-hour postprandial sugars between 80 and 160.  However at the present time his reported blood sugars not well controlled and therefore I hesitate to make any changes.  05/05/19 Patient was recently discharged from the hospital after his surgery.  He is doing exceptional job since discharge from his hospital stay.  He is still taking 28 units of Novolin in the morning and 7 units of regular insulin around dinnertime.  His morning blood sugars before breakfast are between 125 and 138.  His lunchtime sugars are 82-142.  Dinnertime sugars are 95-148.  Bedtime sugars are 79-172    Patient does report breakthrough neuropathy in both feet.  He reports burning and stinging pain like his feet are on fire all day long.  He does not feel that the 300 mg of gabapentin twice daily is sufficient.   Past Medical History:  Diagnosis Date  . ALLERGIC RHINITIS 07/02/2008  . Allergy   . Arthritis    RA  . Atrial fibrillation ([place])   . Cataract    removed both eyes   . CHF 06/19/2008  . COUGH DUE TO ACE INHIBITORS 10/05/2008  . DIABETES  MELLITUS, TYPE II 09/22/2006  . Diabetic retinopathy of both eyes ([facility])    mild nonproliferative  . ED (erectile dysfunction)   . Glaucoma   . GOUT 09/22/2006  . Hx of adenomatous colonic polyps 06/09/2015  . HYPERCHOLESTEROLEMIA 07/02/2008  . HYPERTENSION 05/31/2009  . HYPOGONADISM, MALE 01/15/2007  . Hypokalemia   . 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 Lymphadenopathy   . Peripheral neuropathy   . PUD (peptic ulcer disease)   . Rheumatoid arthritis(714.0) 09/22/2006   Past Surgical History:  Procedure Laterality Date  . ACHILLES TENDON SURGERY Right 04/25/2019   Procedure: ACHILLES LENGTHENING;  Surgeon: Trula Slade, DPM;  Location: [facility];  Service: Podiatry;  Laterality: Right;  .  AMPUTATION Right 04/25/2019   Procedure: AMPUTATION Brynda Peon;  Surgeon: Trula Slade, DPM;  Location: [facility];  Service: Podiatry;  Laterality: Right;  . CATARACT EXTRACTION Bilateral    Dr. Talbert Forest  . CATARACT EXTRACTION, BILATERAL    . COLONOSCOPY  2004  . LYMPH NODE BIOPSY Right 10/16/2014   Procedure: RIGHT INGUINAL LYMPH NODE BIOPSY;  Surgeon: Aviva Signs, MD;  Location: [facility];  Service: General;  Laterality: Right;   Current Outpatient Medications on File Prior to Visit  Medication Sig Dispense Refill  . Accu-Chek Softclix Lancets lancets Use to monitor glucose levels two times per day; E10.49 100 each 12  . Alcohol Swabs (B-D SINGLE USE SWABS REGULAR) PADS Use to cleanse site prior to use of lancet to obtain droplet of blood two times per day; E10.49 200 each 2  . aspirin 81 MG tablet Take 81 mg by mouth daily.      Marland Kitchen atorvastatin (LIPITOR) 10 MG tablet Take 1 tablet (10 mg total) by mouth daily. 90 tablet 1  . BD INSULIN SYRINGE ULTRAFINE 31G X 5/16" 0.5 ML MISC Use with insulin. 100 each 5  . Blood Glucose Calibration (ACCU-CHEK AVIVA) SOLN 1 each by In Vitro route as needed. Use solution prn to evaluate accuracy of meter 1 each 1  . Blood Glucose Monitoring Suppl (ACCU-CHEK AVIVA PLUS) w/Device KIT 1 each by Does not apply route 2 (two) times daily. Use to monitor glucose levels two times per day; E10.49 1 kit 0  . doxycycline (VIBRA-TABS) 100 MG tablet Take 1 tablet (100 mg total) by mouth 2 (two) times daily for 14 days. 28 tablet 0  . glucose blood (ACCU-CHEK AVIVA PLUS) test strip Use to monitor glucose levels two times per day; E10.49 100 each 12  . ibuprofen (ADVIL) 200 MG tablet Take 400 mg by mouth every 8 (eight) hours as needed for headache or moderate pain.    Marland Kitchen insulin NPH Human (HUMULIN N,NOVOLIN N) 100 UNIT/ML injection Inject 0.26 mLs (26 Units total) into the skin every morning. (Patient taking differently: Inject 28 Units into the skin daily. ) 30 mL 2   . insulin regular (NOVOLIN R RELION) 100 units/mL injection Inject 0.05 mLs (5 Units total) daily with supper into the skin. And syringes 2/day (Patient taking differently: Inject 7 Units into the skin at bedtime. ) 10 mL 11  . losartan (COZAAR) 50 MG tablet Take 1 tablet (50 mg total) by mouth daily. 30 tablet 0  . MONOJECT ULTRA COMFORT SYRINGE 29G X 1/2" 1 ML MISC USE TO INJECT INSULIN 4 TIMES PER DAY 360 each 3  . oxyCODONE-acetaminophen (PERCOCET/ROXICET) 5-325 MG tablet Take 1-2 tablets by mouth every 6 (six) hours as needed for severe pain. 20 tablet 0  . Tetrahydrozoline HCl (  VISINE OP) Place 1 drop into both eyes daily as needed (redness).     No current facility-administered medications on file prior to visit.   Allergies  Allergen Reactions  . Penicillins Swelling    Swelling of the hands and feet, skin peeling Did it involve swelling of the face/tongue/throat, SOB, or low BP? No Did it involve sudden or severe rash/hives, skin peeling, or any reaction on the inside of your mouth or nose? No Did you need to seek medical attention at a hospital or doctor's office? Yes When did it last happen?10 + years If all above answers are "NO", may proceed with cephalosporin use.   . Ciprofloxacin Hives and Rash  . Terbinafine Hcl Hives  . Zestril [Lisinopril] Cough   Social History   Socioeconomic History  . Marital status: Married    Spouse name: Not on file  . Number of children: 3  . Years of education: Not on file  . Highest education level: Not on file  Occupational History  . Occupation: Disability    Employer: [employer]  Tobacco Use  . Smoking status: Former Smoker    Quit date: 11/02/1999    Years since quitting: 19.5  . Smokeless tobacco: Never Used  Substance and Sexual Activity  . Alcohol use: No    Alcohol/week: 0.0 standard drinks  . Drug use: No  . Sexual activity: Not on file  Other Topics Concern  . Not on file  Social History Narrative    Married, 3 kids   Prior work [employer] before disability   Regular exercise-yes   Social Determinants of Health   Financial Resource Strain:   . Difficulty of Paying Living Expenses: Not on file  Food Insecurity:   . Worried About Charity fundraiser in the Last Year: Not on file  . Ran Out of Food in the Last Year: Not on file  Transportation Needs:   . Lack of Transportation (Medical): Not on file  . Lack of Transportation (Non-Medical): Not on file  Physical Activity:   . Days of Exercise per Week: Not on file  . Minutes of Exercise per Session: Not on file  Stress:   . Feeling of Stress : Not on file  Social Connections:   . Frequency of Communication with Friends and Family: Not on file  . Frequency of Social Gatherings with Friends and Family: Not on file  . Attends Religious Services: Not on file  . Active Member of Clubs or Organizations: Not on file  . Attends Archivist Meetings: Not on file  . Marital Status: Not on file  Intimate Partner Violence:   . Fear of Current or Ex-Partner: Not on file  . Emotionally Abused: Not on file  . Physically Abused: Not on file  . Sexually Abused: Not on file     Review of Systems  All other systems reviewed and are negative.      Objective:   Physical Exam Vitals reviewed.  Constitutional:      General: He is not in acute distress.    Appearance: Normal appearance. He is normal weight. He is not ill-appearing, toxic-appearing or diaphoretic.  Cardiovascular:     Rate and Rhythm: Normal rate and regular rhythm.     Heart sounds: Normal heart sounds. No murmur.  Pulmonary:     Effort: Pulmonary effort is normal. No respiratory distress.     Breath sounds: Normal breath sounds. No stridor. No wheezing, rhonchi or rales.  Chest:  Chest wall: No tenderness.  Abdominal:     General: Abdomen is flat. Bowel sounds are normal.     Palpations: Abdomen is soft.           Assessment & Plan:  Uncontrolled type 2  diabetes mellitus with skin complication, with long-term current use of insulin ([place]) - Plan: CBC with Differential/Platelet, BASIC METABOLIC PANEL WITH GFR I am extremely proud of the patient's blood sugars.  His current regimen of insulin including 28 units of Novolin N in the morning and 7 units of regular insulin in the evening at dinnertime seem to be working well.  His blood sugars are all between 90 and 190 with the vast majority averaging between 130 and 140.  I do not believe that he can do better without running the risk of hypoglycemia.  Therefore I will make no changes in his insulin dose.  I will recheck a hemoglobin A1c in 3 months.  Check CBC given his drop in hemoglobin after surgery from 1210 as well as a BMP today and if labs are stable recheck in 3 months or sooner if worse.  Meanwhile increase gabapentin to 300 mg 3 times a day.  If this is not sufficient in 2 weeks he can increase to 600 mg twice daily.  After 2 weeks if this is not sufficient we can increase to a maximum of 600 mg 3 times a day.  I wrote this out for the patient including a titration schedule.  He will give me regular updates via telephone to let me know how he is doing.

## 2019-05-06 ENCOUNTER — Ambulatory Visit (INDEPENDENT_AMBULATORY_CARE_PROVIDER_SITE_OTHER): Payer: Medicare HMO | Admitting: Podiatry

## 2019-05-06 ENCOUNTER — Encounter: Payer: Self-pay | Admitting: Podiatry

## 2019-05-06 VITALS — BP 148/83 | HR 60 | Temp 98.4°F

## 2019-05-06 DIAGNOSIS — M869 Osteomyelitis, unspecified: Secondary | ICD-10-CM

## 2019-05-06 DIAGNOSIS — E1149 Type 2 diabetes mellitus with other diabetic neurological complication: Secondary | ICD-10-CM

## 2019-05-06 DIAGNOSIS — Z9889 Other specified postprocedural states: Secondary | ICD-10-CM

## 2019-05-06 NOTE — Progress Notes (Signed)
Subjective: Damante ORVEL MACIOCE is a 64 y.o. is seen today in office s/p right TMA and Achilles tendon lengthening preformed on 04/25/2019.  States he is doing well and is not having any pain and overall feels much better.  He presents here for possible suture removal.  He still on antibiotics that were prescribed when he got discharged in the hospital.  Has been nonweightbearing using a knee scooter and wearing the cam boot.  Denies any systemic concerns including fevers, chills, nausea, vomiting.  No calf pain, chest pain, shortness of breath.   Objective: General: No acute distress, AAOx3  DP/PT pulses palpable , CRT < 3 sec to all digits.  RIGHT foot: Incision is well coapted without any evidence of dehiscence with sutures and staples intact.  There is mild edema but there is no significant erythema or warmth.  There are some bloody drainage on the bandage but no purulence.  No pain Achilles tendon today.  Achilles tendon appears to be intact.  No other open lesions identified at this time. No other open lesions or pre-ulcerative lesions.  No pain with calf compression, swelling, warmth, erythema.   Assessment and Plan:  Status post right transmetatarsal imitation, Achilles tendon lengthening, doing well with no complications   -Treatment options discussed including all alternatives, risks, and complications -We left the sutures intact as well as the staples as he does have some swelling but no obvious signs of infection to ensure adequate healing.  Small amount of antibiotic ointment is applied to the incision followed by dry sterile dressing.  I need the sutures from the Achilles tendon resection site without any complications. -Continue nonweightbearing in cam boot -Elevation -Finish course of antibiotics -Pain medication as needed-he can use Tylenol -Monitor for any clinical signs or symptoms of infection and DVT/PE and directed to call the office immediately should any occur or go to the  ER. -Follow-up in 1 week for suture removal or sooner if any problems arise. In the meantime, encouraged to call the office with any questions, concerns, change in symptoms.   Celesta Gentile, DPM

## 2019-05-15 ENCOUNTER — Other Ambulatory Visit: Payer: Self-pay

## 2019-05-15 ENCOUNTER — Encounter: Payer: Self-pay | Admitting: Podiatry

## 2019-05-15 ENCOUNTER — Ambulatory Visit (INDEPENDENT_AMBULATORY_CARE_PROVIDER_SITE_OTHER): Payer: Medicare HMO | Admitting: Podiatry

## 2019-05-15 VITALS — BP 132/80 | HR 71 | Temp 96.4°F | Resp 14

## 2019-05-15 DIAGNOSIS — E1149 Type 2 diabetes mellitus with other diabetic neurological complication: Secondary | ICD-10-CM

## 2019-05-15 DIAGNOSIS — M869 Osteomyelitis, unspecified: Secondary | ICD-10-CM

## 2019-05-15 DIAGNOSIS — Z9889 Other specified postprocedural states: Secondary | ICD-10-CM

## 2019-05-18 NOTE — Progress Notes (Signed)
Subjective: Aaron Fox is a 64 y.o. is seen today in office s/p right TMA and Achilles tendon lengthening preformed on 04/25/2019.  States he is doing well.  No significant pain.  Notes some intermittent swelling to the ankle but no redness or warmth.  No pain to the Achilles tendon.  He has been nonweightbearing using the cam boot.  Denies any fevers, chills, nausea, vomiting.  No calf pain, chest pain, shortness of breath.   Objective: General: No acute distress, AAOx3  DP, PT pulses palpable. RIGHT foot: Incision is well coapted without any evidence of dehiscence with sutures and staples intact.  There is decreased edema to the foot/ankle.  Minimal edema to the ankle.  There is no erythema or warmth.  No tenderness palpation to the amputation site there is no pain to the Achilles tendon today.  Thompson test is negative and the Achilles tendon appears to be intact.  No other areas of discomfort identified. No pain with calf compression, swelling, warmth, erythema.   Assessment and Plan:  Status post right transmetatarsal imitation, Achilles tendon lengthening, doing well with no complications   -Treatment options discussed including all alternatives, risks, and complications -Remove sutures today Telfa staples intact.  Antibiotic ointment and dressing applied.  Keep the dressing clean, dry, intact.  Remain nonweightbearing in cam boot.  He has mild swelling to the ankle but this is with the bandage has stopped.  We did wrap above the ankle today.  Continue elevation. -Monitor for any clinical signs or symptoms of infection and directed to call the office immediately should any occur or go to the ER.  Return in about 1 week (around 05/22/2019) for staple removal .  Trula Slade DPM

## 2019-05-22 ENCOUNTER — Other Ambulatory Visit: Payer: Self-pay

## 2019-05-22 ENCOUNTER — Ambulatory Visit (INDEPENDENT_AMBULATORY_CARE_PROVIDER_SITE_OTHER): Payer: Medicare HMO | Admitting: Podiatry

## 2019-05-22 DIAGNOSIS — M869 Osteomyelitis, unspecified: Secondary | ICD-10-CM

## 2019-05-22 DIAGNOSIS — Z9889 Other specified postprocedural states: Secondary | ICD-10-CM

## 2019-05-22 DIAGNOSIS — E1149 Type 2 diabetes mellitus with other diabetic neurological complication: Secondary | ICD-10-CM

## 2019-05-26 NOTE — Progress Notes (Signed)
Subjective: Aaron Fox is a 64 y.o. is seen today in office s/p right TMA and Achilles tendon lengthening preformed on 04/25/2019.  Overall states he is doing well.  Presents here for possible staple removal.  Has been nonweightbearing in the cam boot.  No pain or swelling is improved.  Denies any fevers, chills, nausea, vomiting.  No calf pain, chest pain, shortness of breath.   Objective: General: No acute distress, AAOx3  DP, PT pulses palpable. RIGHT foot: Incision is well coapted without any evidence of dehiscence with staples intact.  Some dried blood along the incision but there is no active drainage and there is no purulence.  Mild swelling to the foot and ankle but there is no erythema or warmth.  No tenderness palpation.  No pain with Achilles tendon.  Achilles tendon appears to be intact. No pain with calf compression, swelling, warmth, erythema.   Assessment and Plan:  Status post right transmetatarsal imitation, Achilles tendon lengthening, doing well with no complications   -Treatment options discussed including all alternatives, risks, and complications -I removed The staples today but the other half intact.  Betadine was painted over the incision followed by dry sterile dressing.  Continue nonweightbearing in the cam boot.  Elevation. -Monitor for any clinical signs or symptoms of infection and directed to call the office immediately should any occur or go to the ER.  Return in about 1 week (around 05/29/2019).  Staple removal  Trula Slade DPM

## 2019-05-29 ENCOUNTER — Other Ambulatory Visit: Payer: Self-pay

## 2019-05-29 ENCOUNTER — Encounter: Payer: Medicare HMO | Admitting: Podiatry

## 2019-05-29 ENCOUNTER — Ambulatory Visit (INDEPENDENT_AMBULATORY_CARE_PROVIDER_SITE_OTHER): Payer: Medicare HMO | Admitting: Podiatry

## 2019-05-29 ENCOUNTER — Encounter: Payer: Self-pay | Admitting: Podiatry

## 2019-05-29 DIAGNOSIS — M869 Osteomyelitis, unspecified: Secondary | ICD-10-CM

## 2019-05-29 DIAGNOSIS — E1149 Type 2 diabetes mellitus with other diabetic neurological complication: Secondary | ICD-10-CM

## 2019-05-29 DIAGNOSIS — Z9889 Other specified postprocedural states: Secondary | ICD-10-CM

## 2019-06-03 NOTE — Progress Notes (Signed)
Subjective: Aaron Fox is a 64 y.o. is seen today in office s/p right TMA and Achilles tendon lengthening preformed on 04/25/2019.  He presents here for staple removal.  Overall he is doing well and is having no pain.  Is been nonweightbearing in the cam boot.  He did have one close fall but no injury he did not actually fall but did catch himself..  Denies any fevers, chills, nausea, vomiting.  No calf pain, chest pain, shortness of breath.  He has no other concerns today.  Objective: General: No acute distress, AAOx3  DP, PT pulses palpable. RIGHT foot: Incision is well coapted without any evidence of dehiscence with staples intact.  There is mild edema but there is no erythema or warmth.  There is some dried blood along the bandage and along the incision but are not able to identify any drainage today and there is no active bleeding.  There is no purulence.  There is no erythema or warmth.  No pain with Achilles tendon.  Tendon appears to be intact. No pain with calf compression, swelling, warmth, erythema.   Assessment and Plan:  Status post right transmetatarsal imitation, Achilles tendon lengthening, doing well with no complications   -Treatment options discussed including all alternatives, risks, and complications -Remainder the staples removed today without complications.  Incision remained well coapted.  Steri-Strips were applied for reinforcement followed by antibiotic ointment and a dressing.  In about 2 to 3 days he remove the bandage.  He can wash the area soap and water.  Dry thoroughly.  Applied bandage.  Continue nonweightbearing in cam boot.  Elevation. -Monitor for any clinical signs or symptoms of infection and directed to call the office immediately should any occur or go to the ER.  Return in about 1 week (around 06/05/2019).  Trula Slade DPM

## 2019-06-06 ENCOUNTER — Other Ambulatory Visit: Payer: Self-pay | Admitting: Endocrinology

## 2019-06-06 DIAGNOSIS — E11319 Type 2 diabetes mellitus with unspecified diabetic retinopathy without macular edema: Secondary | ICD-10-CM

## 2019-06-07 NOTE — Telephone Encounter (Signed)
1.  Please schedule f/u appt 2.  Then please refill x 1, pending that appt.  

## 2019-06-09 ENCOUNTER — Other Ambulatory Visit: Payer: Self-pay

## 2019-06-09 ENCOUNTER — Encounter: Payer: Self-pay | Admitting: Podiatry

## 2019-06-09 ENCOUNTER — Ambulatory Visit (INDEPENDENT_AMBULATORY_CARE_PROVIDER_SITE_OTHER): Payer: Medicare HMO | Admitting: Podiatry

## 2019-06-09 VITALS — Temp 97.2°F

## 2019-06-09 DIAGNOSIS — Z9889 Other specified postprocedural states: Secondary | ICD-10-CM

## 2019-06-09 DIAGNOSIS — M869 Osteomyelitis, unspecified: Secondary | ICD-10-CM

## 2019-06-09 DIAGNOSIS — Z89411 Acquired absence of right great toe: Secondary | ICD-10-CM | POA: Diagnosis not present

## 2019-06-09 DIAGNOSIS — Z89421 Acquired absence of other right toe(s): Secondary | ICD-10-CM | POA: Diagnosis not present

## 2019-06-09 DIAGNOSIS — E1149 Type 2 diabetes mellitus with other diabetic neurological complication: Secondary | ICD-10-CM

## 2019-06-09 MED ORDER — DOXYCYCLINE HYCLATE 100 MG PO TABS: 100.0000 mg | ORAL_TABLET | Freq: Two times a day (BID) | ORAL | 0 refills | Status: DC

## 2019-06-09 NOTE — Progress Notes (Signed)
Subjective: Aaron Fox is a 64 y.o. is seen today in office s/p right TMA and Achilles tendon lengthening preformed on 04/25/2019. He states that he is still having some swelling but otherwise doing well.  No pain.  He is still in the cam boot has been try to stay nonweightbearing as possible.  He denies any fevers, chills, nausea, vomiting.  No calf pain, chest pain, shortness of breath.  Objective: General: No acute distress, AAOx3  DP, PT pulses palpable. RIGHT foot: Incision is well coapted without any evidence of dehiscence.  Along the central aspect of the incision there is macerated tissue but there is no drainage or pus.  There is mild swelling but there is no erythema or warmth.  There is no fluctuation crepitation.  There is no malodor.  No pain Achilles tendon. No pain with calf compression, swelling, warmth, erythema.   Assessment and Plan:  Status post right transmetatarsal amputation, Achilles tendon lengthening, swelling  -Treatment options discussed including all alternatives, risks, and complications -I cleaned the incision site today.  We will switch to using a small amount of Betadine to the area daily followed by dry sterile dressing.  He can change the bandage as well at home.  Given the swelling as well as macerated tissue will start doxycycline as a precaution.  Continue to elevate. -Continue in cam boot.  He can be partial weightbearing. -Monitor for any clinical signs or symptoms of infection and directed to call the office immediately should any occur or go to the ER.  RTC 10 days or sooner if needed   Trula Slade DPM

## 2019-06-11 ENCOUNTER — Other Ambulatory Visit: Payer: Self-pay

## 2019-06-13 ENCOUNTER — Ambulatory Visit: Payer: Medicare HMO | Admitting: Endocrinology

## 2019-06-13 ENCOUNTER — Encounter: Payer: Self-pay | Admitting: Endocrinology

## 2019-06-13 ENCOUNTER — Other Ambulatory Visit: Payer: Self-pay

## 2019-06-13 VITALS — BP 160/70 | HR 78 | Ht 71.5 in | Wt 222.0 lb

## 2019-06-13 DIAGNOSIS — Z794 Long term (current) use of insulin: Secondary | ICD-10-CM | POA: Diagnosis not present

## 2019-06-13 DIAGNOSIS — E11319 Type 2 diabetes mellitus with unspecified diabetic retinopathy without macular edema: Secondary | ICD-10-CM | POA: Diagnosis not present

## 2019-06-13 LAB — POCT GLYCOSYLATED HEMOGLOBIN (HGB A1C): Hemoglobin A1C: 6 % — AB (ref 4.0–5.6)

## 2019-06-13 MED ORDER — INSULIN NPH (HUMAN) (ISOPHANE) 100 UNIT/ML ~~LOC~~ SUSP: 27.0000 [IU] | SUBCUTANEOUS | 2 refills | Status: DC

## 2019-06-13 MED ORDER — "INSULIN SYRINGE 29G X 1/2"" 1 ML MISC": 1.0000 | Freq: Two times a day (BID) | 3 refills | Status: DC

## 2019-06-13 MED ORDER — INSULIN REGULAR HUMAN 100 UNIT/ML IJ SOLN: 6.0000 [IU] | Freq: Every day | INTRAMUSCULAR | 11 refills | Status: DC

## 2019-06-13 MED ORDER — ACCU-CHEK AVIVA PLUS VI STRP: ORAL_STRIP | 3 refills | Status: DC

## 2019-06-13 NOTE — Progress Notes (Signed)
Subjective:    Patient ID: Aaron Fox, male    DOB: April 12, 1955, 64 y.o.   MRN: [id number]  HPI Pt returns for f/u of diabetes mellitus: DM type: 1 Dx'ed: [id number] Complications: PN, DR, PAD, and CRI.   Therapy: insulin since 2003.  DKA: never.  Severe hypoglycemia: never.   Pancreatitis: never.  SDOH: he takes human insulin, due to cost; he is disabled.   Other: he takes 2 QD insulins, after poor results with multiple daily injections.   Interval history: He brings a record of his cbg's which I have reviewed today.  He checks 6 x/day.  cbg's vary from 47-226.  There is no trend throughout the day.  He has hypoglycemia approx twice per week.  Pt says he never misses the insulin.  No recent steroids. He takes NPH 28 units qam, and reg, 7 units with supper.   Past Medical History:  Diagnosis Date  . ALLERGIC RHINITIS 07/02/2008  . Allergy   . Arthritis    RA  . Atrial fibrillation ([place])   . Cataract    removed both eyes   . CHF 06/19/2008  . COUGH DUE TO ACE INHIBITORS 10/05/2008  . DIABETES MELLITUS, TYPE II 09/22/2006  . Diabetic retinopathy of both eyes ([facility])    mild nonproliferative  . ED (erectile dysfunction)   . Glaucoma   . GOUT 09/22/2006  . Hx of adenomatous colonic polyps 06/09/2015  . HYPERCHOLESTEROLEMIA 07/02/2008  . HYPERTENSION 05/31/2009  . HYPOGONADISM, MALE 01/15/2007  . Hypokalemia   . 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 Lymphadenopathy   . Peripheral neuropathy   . PUD (peptic ulcer disease)   . Rheumatoid arthritis(714.0) 09/22/2006    Past Surgical History:  Procedure Laterality Date  . ACHILLES TENDON SURGERY Right 04/25/2019   Procedure: ACHILLES LENGTHENING;  Surgeon: Trula Slade, DPM;  Location: [facility];  Service: Podiatry;  Laterality: Right;  . AMPUTATION Right 04/25/2019   Procedure: AMPUTATION Brynda Peon;  Surgeon: Trula Slade, DPM;  Location: [facility];  Service: Podiatry;  Laterality: Right;  . CATARACT EXTRACTION  Bilateral    Dr. Talbert Forest  . CATARACT EXTRACTION, BILATERAL    . COLONOSCOPY  2004  . LYMPH NODE BIOPSY Right 10/16/2014   Procedure: RIGHT INGUINAL LYMPH NODE BIOPSY;  Surgeon: Aviva Signs, MD;  Location: [facility];  Service: General;  Laterality: Right;    Social History   Socioeconomic History  . Marital status: Married    Spouse name: Not on file  . Number of children: 3  . Years of education: Not on file  . Highest education level: Not on file  Occupational History  . Occupation: Disability    Employer: [employer]  Tobacco Use  . Smoking status: Former Smoker    Quit date: 11/02/1999    Years since quitting: 19.6  . Smokeless tobacco: Never Used  Substance and Sexual Activity  . Alcohol use: No    Alcohol/week: 0.0 standard drinks  . Drug use: No  . Sexual activity: Not on file  Other Topics Concern  . Not on file  Social History Narrative   Married, 3 kids   Prior work [employer] before disability   Regular exercise-yes   Social Determinants of Health   Financial Resource Strain:   . Difficulty of Paying Living Expenses:   Food Insecurity:   . Worried About Charity fundraiser in the Last Year:   . [place] in the Last Year:  Transportation Needs:   . Film/video editor (Medical):   Marland Kitchen Lack of Transportation (Non-Medical):   Physical Activity:   . Days of Exercise per Week:   . Minutes of Exercise per Session:   Stress:   . Feeling of Stress :   Social Connections:   . Frequency of Communication with Friends and Family:   . Frequency of Social Gatherings with Friends and Family:   . Attends Religious Services:   . Active Member of Clubs or Organizations:   . Attends Archivist Meetings:   Marland Kitchen Marital Status:   Intimate Partner Violence:   . Fear of Current or Ex-Partner:   . Emotionally Abused:   Marland Kitchen Physically Abused:   . Sexually Abused:     Current Outpatient Medications on File Prior to Visit  Medication Sig Dispense Refill  . Alcohol  Swabs (B-D SINGLE USE SWABS REGULAR) PADS Use to cleanse site prior to use of lancet to obtain droplet of blood two times per day; E10.49 200 each 2  . aspirin 81 MG tablet Take 81 mg by mouth daily.      Marland Kitchen atorvastatin (LIPITOR) 10 MG tablet Take 1 tablet (10 mg total) by mouth daily. 90 tablet 1  . Blood Glucose Calibration (ACCU-CHEK AVIVA) SOLN 1 each by In Vitro route as needed. Use solution prn to evaluate accuracy of meter 1 each 1  . doxycycline (VIBRA-TABS) 100 MG tablet Take 1 tablet (100 mg total) by mouth 2 (two) times daily. 20 tablet 0  . gabapentin (NEURONTIN) 300 MG capsule Take 2 capsules (600 mg total) by mouth 3 (three) times daily. 540 capsule 2  . ibuprofen (ADVIL) 200 MG tablet Take 400 mg by mouth every 8 (eight) hours as needed for headache or moderate pain.    Marland Kitchen losartan (COZAAR) 50 MG tablet Take 1 tablet (50 mg total) by mouth daily. 30 tablet 0  . Tetrahydrozoline HCl (VISINE OP) Place 1 drop into both eyes daily as needed (redness).     No current facility-administered medications on file prior to visit.    Allergies  Allergen Reactions  . Penicillins Swelling    Swelling of the hands and feet, skin peeling Did it involve swelling of the face/tongue/throat, SOB, or low BP? No Did it involve sudden or severe rash/hives, skin peeling, or any reaction on the inside of your mouth or nose? No Did you need to seek medical attention at a hospital or doctor's office? Yes When did it last happen?10 + years If all above answers are "NO", may proceed with cephalosporin use.   . Ciprofloxacin Hives and Rash  . Terbinafine Hcl Hives  . Zestril [Lisinopril] Cough    Family History  Problem Relation Age of Onset  . Heart disease Other        FH of CAD  . Colon polyps Paternal Uncle   . Kidney disease Paternal Uncle   . Diabetes Paternal Grandfather   . Diabetes Maternal Uncle   . Cancer Neg Hx   . Colon cancer Neg Hx   . Gallbladder disease Neg Hx   .  Esophageal cancer Neg Hx   . Rectal cancer Neg Hx   . Stomach cancer Neg Hx     BP (!) 160/70   Pulse 78   Ht 5' 11.5" (1.816 m)   Wt 222 lb (100.7 kg)   SpO2 98%   BMI 30.53 kg/m    Review of Systems Denies LOC    Objective:  Physical Exam VITAL SIGNS:  See vs page GENERAL: no distress Pulses: left foot foot pulses are intact MSK: no deformity of the left foot or ankle. CV: no edema of the left leg or ankle   Skin:  no ulcer on the left foot or ankle.  normal color and temp on the left foot and ankle Neuro: sensation is intact to touch on the left foot and ankle, but decreased from normal. Ext: There is severe bilateral onychomycosis of the left foot toenails.    Lab Results  Component Value Date   HGBA1C 6.0 (A) 06/13/2019    Lab Results  Component Value Date   CREATININE 1.10 05/05/2019   BUN 18 05/05/2019   NA 141 05/05/2019   K 4.4 05/05/2019   CL 108 05/05/2019   CO2 23 05/05/2019      Assessment & Plan:  Type 1 DM, with PAD: overcontrolled Hypoglycemia: this limits aggressiveness of glycemic control Noncompliance with cbg recording and f/u OV's: This limits rx effectiveness HTN: is noted today  Patient Instructions  Your blood pressure is high today.  Please see a new primary care provider soon, to have it rechecked Please reduce the insulins by 1 unit each, to the numbers listed below. check your blood sugar 7 times a day.  vary the time of day when you check, between before the 3 meals, and at bedtime.  also check if you have symptoms of your blood sugar being too high or too low.  please keep a record of the readings and bring it to your next appointment here (or you can bring the meter itself).  You can write it on any piece of paper.  please call us sooner if your blood sugar goes below 70, or if you have a lot of readings over 200. Please come back for a follow-up appointment in 3 months.

## 2019-06-13 NOTE — Patient Instructions (Addendum)
Your blood pressure is high today.  Please see a new primary care provider soon, to have it rechecked Please reduce the insulins by 1 unit each, to the numbers listed below. check your blood sugar 7 times a day.  vary the time of day when you check, between before the 3 meals, and at bedtime.  also check if you have symptoms of your blood sugar being too high or too low.  please keep a record of the readings and bring it to your next appointment here (or you can bring the meter itself).  You can write it on any piece of paper.  please call us sooner if your blood sugar goes below 70, or if you have a lot of readings over 200. Please come back for a follow-up appointment in 3 months.

## 2019-06-19 ENCOUNTER — Other Ambulatory Visit: Payer: Self-pay

## 2019-06-19 ENCOUNTER — Telehealth: Payer: Self-pay

## 2019-06-19 ENCOUNTER — Ambulatory Visit (INDEPENDENT_AMBULATORY_CARE_PROVIDER_SITE_OTHER): Payer: Medicare HMO | Admitting: Podiatry

## 2019-06-19 ENCOUNTER — Encounter: Payer: Self-pay | Admitting: Podiatry

## 2019-06-19 VITALS — Temp 96.1°F

## 2019-06-19 DIAGNOSIS — M869 Osteomyelitis, unspecified: Secondary | ICD-10-CM

## 2019-06-19 DIAGNOSIS — Z9889 Other specified postprocedural states: Secondary | ICD-10-CM

## 2019-06-19 DIAGNOSIS — E1149 Type 2 diabetes mellitus with other diabetic neurological complication: Secondary | ICD-10-CM

## 2019-06-19 DIAGNOSIS — E11319 Type 2 diabetes mellitus with unspecified diabetic retinopathy without macular edema: Secondary | ICD-10-CM

## 2019-06-19 MED ORDER — ACCU-CHEK GUIDE ME W/DEVICE KIT: 1.0000 | PACK | 0 refills | Status: DC

## 2019-06-19 MED ORDER — ACCU-CHEK SOFTCLIX LANCETS MISC: 1.0000 | 0 refills | Status: DC

## 2019-06-19 MED ORDER — ACCU-CHEK GUIDE VI STRP: 1.0000 | ORAL_STRIP | 0 refills | Status: DC

## 2019-06-19 NOTE — Telephone Encounter (Signed)
FAXED [place]: [facility]  Document: Physician Statement (completed dated and signed), Physical Exam completed by Dr. Jacqualyn Posey but NOT signed by Dr. Loanne Drilling as he WAS NOT present at the time Dr. Jacqualyn Posey completed HIS exam. Other records requested: None requested  All above requested information has been faxed successfully to the Company listed above. Documents and fax confirmation have been placed in the faxed file for future reference.

## 2019-06-19 NOTE — Progress Notes (Signed)
Subjective: Aaron Fox is a 64 y.o. is seen today in office s/p right TMA and Achilles tendon lengthening preformed on 04/25/2019.  He states he is doing well.  He is having no pain and swelling is improving.  Denies any open sores or any drainage.  Denies any fevers, chills, nausea, vomiting.  No calf pain, chest pain, shortness of breath.   Objective: General: No acute distress, AAOx3  DP, PT pulses palpable. RIGHT foot: Incision is well coapted without any evidence of dehiscence.  There is dry skin along the incision upon debridement the incision is healed.  There is no tenderness palpation to this area or on the Achilles tendon site.  Mild edema but overall improved.  There is no erythema or warmth.  No drainage or pus or any signs of infection. No pain with calf compression, swelling, warmth, erythema.   Assessment and Plan:  Status post right transmetatarsal amputation, Achilles tendon lengthening, swelling which is improved  -Treatment options discussed including all alternatives, risks, and complications -I debrided some of the dry skin today.  Incision appears to be healing well there is no open sore identified at this time.  Continue to dress the area with a small amount of antibiotic ointment.  He can wash the foot with soap and water.  Dry thoroughly.  Continue cam boot he can be weightbearing as tolerated. -Monitor for any clinical signs or symptoms of infection and directed to call the office immediately should any occur or go to the ER.  Return in about 2 weeks (around 07/03/2019).  Trula Slade DPM

## 2019-06-20 ENCOUNTER — Telehealth: Payer: Self-pay | Admitting: Endocrinology

## 2019-06-20 ENCOUNTER — Other Ambulatory Visit: Payer: Self-pay

## 2019-06-20 DIAGNOSIS — Z794 Long term (current) use of insulin: Secondary | ICD-10-CM

## 2019-06-20 DIAGNOSIS — E11319 Type 2 diabetes mellitus with unspecified diabetic retinopathy without macular edema: Secondary | ICD-10-CM

## 2019-06-20 MED ORDER — ACCU-CHEK SOFTCLIX LANCETS MISC: 1.0000 | 0 refills | Status: DC

## 2019-06-20 MED ORDER — ACCU-CHEK GUIDE VI STRP: 1.0000 | ORAL_STRIP | 0 refills | Status: DC

## 2019-06-20 NOTE — Telephone Encounter (Signed)
glucose blood (ACCU-CHEK GUIDE) test strip 630 each 0 06/20/2019    Sig - Route: 1 each by Other route See admin instructions. Use to monitor glucose levels 7 times daily; E11.9; WILL NOT COMPLETE PA - Other   Sent to pharmacy as: glucose blood (ACCU-CHEK GUIDE) test strip   E-Prescribing Status: Receipt confirmed by pharmacy (06/20/2019 11:27 AM EDT)

## 2019-06-20 NOTE — Telephone Encounter (Signed)
Both Rxs resent. See confirmation below that each medication successfully went through. Accu-Chek Softclix Lancets lancets 630 each 0 06/20/2019    Sig - Route: 1 each by Other route See admin instructions. Use to monitor glucose levels 7 times daily; E11.9; WILL NOT COMPLETE PA - Other   Sent to pharmacy as: Accu-Chek Softclix Lancets lancets   E-Prescribing Status: Receipt confirmed by pharmacy (06/20/2019 11:27 AM EDT)

## 2019-06-20 NOTE — Telephone Encounter (Signed)
Patient called stating he just got off the phone with [facility] and they did not receive RX for the lancets and test strips. Requesting to please resend. (accu check)  Patient ph# [phone] Pharmacy # - [phone]

## 2019-06-30 ENCOUNTER — Telehealth: Payer: Self-pay

## 2019-06-30 NOTE — Telephone Encounter (Signed)
APPROVAL  Medical Equipment: Accu Chek Guide Test Strips Insurance Company: [employer] PA response: APPROVED UNDER MEDICARE PART B Approval dates: 06/30/19 through 02/27/20  Document has been labeled and placed in scan file for HIM and for our future reference.  DENIAL  Medical Equipment: Accu Chek Guide Test Strips Insurance Company: [employer] PA response: DENIED UNDER MEDICARE PART D Rationale: According to Medicare Claims Processing Manual (Chapter 20), Medicare Part B covers diabetic supplies not associated with insulin delivery, such as testing supplies.

## 2019-06-30 NOTE — Telephone Encounter (Signed)
PRIOR AUTHORIZATION  PA initiation date: 06/30/19  Medical Equipment: Accu Chek Guide Test Strips (# of times testing EXCEEDS plans Qty limits. If denied, may need to consider CGM) Insurance Company: [employer] completed electronically through [employer] My Meds: Yes  Will await insurance response re: approval/denial.  Kerrin Mo (KeyIK:[id number])  Your information has been sent to [facility]. Zakaria Buman ([phone])  Your information has been submitted to [facility]. [employer] will review the request and will issue a decision, typically within 3-7 days from your submission. You can check the updated outcome later by reopening this request.  If [employer] has not responded in 3-7 days or if you have any questions about your ePA request, please contact [employer] at [phone]. If you think there may be a problem with your PA request, use our live chat feature at the bottom right.  For [place] requests, please call [phone].

## 2019-07-01 ENCOUNTER — Ambulatory Visit (INDEPENDENT_AMBULATORY_CARE_PROVIDER_SITE_OTHER): Payer: Medicare HMO | Admitting: Podiatry

## 2019-07-01 ENCOUNTER — Other Ambulatory Visit: Payer: Self-pay

## 2019-07-01 DIAGNOSIS — M869 Osteomyelitis, unspecified: Secondary | ICD-10-CM

## 2019-07-01 DIAGNOSIS — E1149 Type 2 diabetes mellitus with other diabetic neurological complication: Secondary | ICD-10-CM

## 2019-07-01 DIAGNOSIS — Z9889 Other specified postprocedural states: Secondary | ICD-10-CM

## 2019-07-04 NOTE — Progress Notes (Signed)
Subjective: Aaron Fox is a 64 y.o. is seen today in office s/p right TMA and Achilles tendon lengthening preformed on 04/25/2019.  States he is doing well.  Gets some occasional swelling but is intermittent.  No erythema or warmth.  He states incisions healing well.  He has not had any drainage.  He has no pain.  Denies any open sores or any drainage.  Denies any fevers, chills, nausea, vomiting.  No calf pain, chest pain, shortness of breath.   Objective: General: No acute distress, AAOx3  DP, PT pulses palpable. RIGHT foot: Incision is well coapted however on the central aspect there was a scab that looks like it was come off and there is very superficial opening.  There is no probing, undermining or tunneling.  There is no drainage or pus.  No surrounding erythema, ascending cellulitis there is no fluctuation or crepitation.  There is no malodor.  Adequate range of motion Achilles tendon appears to be equal bilaterally. No pain with calf compression, swelling, warmth, erythema.   Assessment and Plan:  Status post right transmetatarsal amputation, Achilles tendon lengthening, swelling which is improved  -Treatment options discussed including all alternatives, risks, and complications -The area that scabs come off there is a superficial opening.  Debrided this today and clean the area.  Recommend a small amount of antibiotic ointment dressing changes daily.  Continue in cam boot for now.  He is to be measured for diabetic insert with filler on the right side. -Monitor for any clinical signs or symptoms of infection and directed to call the office immediately should any occur or go to the ER.  Return in about 3 weeks (around 07/22/2019) for surgery follow up and pick up insert.  Trula Slade DPM

## 2019-07-21 ENCOUNTER — Ambulatory Visit (INDEPENDENT_AMBULATORY_CARE_PROVIDER_SITE_OTHER): Payer: Medicare HMO | Admitting: Podiatry

## 2019-07-21 ENCOUNTER — Other Ambulatory Visit: Payer: Self-pay

## 2019-07-21 ENCOUNTER — Encounter: Payer: Self-pay | Admitting: Podiatry

## 2019-07-21 ENCOUNTER — Telehealth: Payer: Self-pay | Admitting: *Deleted

## 2019-07-21 ENCOUNTER — Ambulatory Visit: Payer: Medicare HMO

## 2019-07-21 VITALS — Temp 97.5°F

## 2019-07-21 DIAGNOSIS — M79661 Pain in right lower leg: Secondary | ICD-10-CM

## 2019-07-21 DIAGNOSIS — E1149 Type 2 diabetes mellitus with other diabetic neurological complication: Secondary | ICD-10-CM | POA: Diagnosis not present

## 2019-07-21 DIAGNOSIS — M869 Osteomyelitis, unspecified: Secondary | ICD-10-CM

## 2019-07-21 DIAGNOSIS — Z9889 Other specified postprocedural states: Secondary | ICD-10-CM

## 2019-07-21 DIAGNOSIS — B351 Tinea unguium: Secondary | ICD-10-CM

## 2019-07-21 DIAGNOSIS — R609 Edema, unspecified: Secondary | ICD-10-CM

## 2019-07-21 DIAGNOSIS — M79675 Pain in left toe(s): Secondary | ICD-10-CM

## 2019-07-21 NOTE — Progress Notes (Signed)
Subjective: Aaron Fox is a 64 y.o. is seen today in office s/p right TMA and Achilles tendon lengthening preformed on 04/25/2019. He has been having some swelling which has been ongoing since surgery is intermittent.  Is not been worsening.  No redness or warmth.  He does elevate his foot at night in the morning it is normal.  He states that the day with his legs in a dependent position.  Awaiting inserts.  Also asking for the nails were trimmed on the left side.  Denies any redness or drainage or any swelling to the toenail sites.  A1c 6.0 06/13/2019  Denies any fevers, chills, nausea, vomiting.  No calf pain, chest pain, shortness of breath.   Objective: General: No acute distress, AAOx3  DP, PT pulses palpable. RIGHT foot: Mild swelling to foot and leg.  There is no significant increase in warmth but of the contralateral knee.  Incision appears to be healed there is no drainage or pus.  There is no fluctuation crepitation or any open lesions identified. LEFT foot: Nails are hypertrophic, dystrophic, discolored x5.  Tenderness nails 1-5 on the left foot. No pain with calf compression, swelling, warmth, erythema.   Assessment and Plan:  Status post right transmetatarsal amputation, Achilles tendon lengthening, swelling; symptomatic onychomycosis  -Treatment options discussed including all alternatives, risks, and complications -Incision is healed.  The swelling appears to be intermittent.  Sensitivity of this.  Dependent positioning elevates at nighttime and normal in the morning.  Also order venous duplex without DVT as there is some swelling today.  There is no significant increase in warmth around the monitor daily.  No systemic symptoms of infection.  Continue with surgical shoe until he can get the new diabetic shoes, inserts.  Still waiting authorization. -Nails sharply debrided X5 without complications or pain in the left foot.  RTC 4 weeks or sooner if needed.  Trula Slade DPM

## 2019-07-21 NOTE — Telephone Encounter (Signed)
Faxed orders to [facility]. 

## 2019-07-25 ENCOUNTER — Ambulatory Visit (HOSPITAL_COMMUNITY)
Admission: RE | Admit: 2019-07-25 | Discharge: 2019-07-25 | Disposition: A | Discharge status: Home / Self Care | Payer: Medicare HMO | Source: Ambulatory Visit | Attending: Cardiovascular Disease | Admitting: Cardiovascular Disease

## 2019-07-25 ENCOUNTER — Other Ambulatory Visit: Payer: Self-pay

## 2019-07-25 DIAGNOSIS — Z9889 Other specified postprocedural states: Secondary | ICD-10-CM | POA: Diagnosis not present

## 2019-07-25 DIAGNOSIS — R609 Edema, unspecified: Secondary | ICD-10-CM | POA: Diagnosis not present

## 2019-07-25 DIAGNOSIS — M79661 Pain in right lower leg: Secondary | ICD-10-CM | POA: Diagnosis not present

## 2019-07-29 ENCOUNTER — Other Ambulatory Visit: Payer: Medicare HMO | Admitting: Orthotics

## 2019-08-11 ENCOUNTER — Telehealth: Payer: Self-pay | Admitting: Podiatry

## 2019-08-11 NOTE — Telephone Encounter (Signed)
Pt called checking on status of diabetic shoes/inserts and they are in production.   He then asked to speak to Lattie Haw and I checked and she is currently with a pt. I told pt I would give her the message to call him

## 2019-08-18 ENCOUNTER — Ambulatory Visit (INDEPENDENT_AMBULATORY_CARE_PROVIDER_SITE_OTHER): Payer: Medicare HMO | Admitting: Podiatry

## 2019-08-18 ENCOUNTER — Other Ambulatory Visit: Payer: Self-pay | Admitting: Endocrinology

## 2019-08-18 ENCOUNTER — Encounter: Payer: Self-pay | Admitting: Podiatry

## 2019-08-18 ENCOUNTER — Other Ambulatory Visit: Payer: Self-pay

## 2019-08-18 DIAGNOSIS — Z794 Long term (current) use of insulin: Secondary | ICD-10-CM

## 2019-08-18 DIAGNOSIS — E1149 Type 2 diabetes mellitus with other diabetic neurological complication: Secondary | ICD-10-CM

## 2019-08-18 DIAGNOSIS — Z9889 Other specified postprocedural states: Secondary | ICD-10-CM

## 2019-08-18 DIAGNOSIS — M869 Osteomyelitis, unspecified: Secondary | ICD-10-CM

## 2019-08-18 NOTE — Progress Notes (Signed)
Subjective: Aaron Fox is a 64 y.o. is seen today in office s/p right TMA and Achilles tendon lengthening preformed on 04/25/2019.  States that he has been doing well but awaiting his diabetic shoe, insert with toe filler.  He has had no drainage or any open sores on the left side.  Has been wearing surgical shoe without any issues.  Swelling is also improved has been wearing compression socks.  No groin pain-ultrasound did show possible enlarged lymph node.  A1c 6.0 06/13/2019  Denies any fevers, chills, nausea, vomiting.  No calf pain, chest pain, shortness of breath.   Objective: General: No acute distress, AAOx3  DP, PT pulses palpable. RIGHT foot: The incision appears to be well coapted without any evidence of dehiscence.  Mild callus formation is evident at the central aspect of the incision without any drainage or pus.  There is mild edema to the foot there is no erythema warmth.  There is no fluctuation crepitation.  There is no ascending cellulitis.  There is no malodor. No pain with calf compression, swelling, warmth, erythema.           Assessment and Plan:  Status post right transmetatarsal amputation, Achilles tendon lengthening, swelling  -Treatment options discussed including all alternatives, risks, and complications -Swelling has improved.  There is no signs of infection currently.  He is awaiting his diabetic shoes, inserts we will check levels for now.  Encourage elevation continue compression.  Reviewed the ultrasound results with him and likely much lymph node.  No pain in the groin.  We will monitor this and recommend follow-up with his primary care physician.   RTC 4 weeks or sooner if needed.  Trula Slade DPM

## 2019-09-03 ENCOUNTER — Telehealth: Payer: Self-pay | Admitting: Podiatry

## 2019-09-03 NOTE — Telephone Encounter (Signed)
Pt left message on 7.1 checking on diabetic shoe status.  I returned call and left message after checking with [facility] it looks like shoes are on back order until around 7.20 and I will call pt when they come in to schedule an appt to pick them up.Marland KitchenMarland Kitchen

## 2019-09-12 ENCOUNTER — Encounter: Payer: Self-pay | Admitting: Endocrinology

## 2019-09-12 ENCOUNTER — Other Ambulatory Visit: Payer: Self-pay

## 2019-09-12 ENCOUNTER — Ambulatory Visit: Payer: Medicare HMO | Admitting: Endocrinology

## 2019-09-12 VITALS — BP 138/70 | HR 71 | Ht 71.5 in | Wt 215.0 lb

## 2019-09-12 DIAGNOSIS — E1049 Type 1 diabetes mellitus with other diabetic neurological complication: Secondary | ICD-10-CM | POA: Diagnosis not present

## 2019-09-12 DIAGNOSIS — E1065 Type 1 diabetes mellitus with hyperglycemia: Secondary | ICD-10-CM | POA: Diagnosis not present

## 2019-09-12 DIAGNOSIS — IMO0002 Reserved for concepts with insufficient information to code with codable children: Secondary | ICD-10-CM

## 2019-09-12 LAB — POCT GLYCOSYLATED HEMOGLOBIN (HGB A1C): Hemoglobin A1C: 5.8 % — AB (ref 4.0–5.6)

## 2019-09-12 MED ORDER — INSULIN NPH (HUMAN) (ISOPHANE) 100 UNIT/ML ~~LOC~~ SUSP: 25.0000 [IU] | SUBCUTANEOUS | 2 refills | Status: DC

## 2019-09-12 MED ORDER — TRIAMCINOLONE ACETONIDE 0.1 % EX CREA: 1.0000 "application " | TOPICAL_CREAM | Freq: Three times a day (TID) | CUTANEOUS | 2 refills | Status: DC

## 2019-09-12 MED ORDER — INSULIN REGULAR HUMAN 100 UNIT/ML IJ SOLN: 4.0000 [IU] | Freq: Every day | INTRAMUSCULAR | 11 refills | Status: DC

## 2019-09-12 NOTE — Progress Notes (Signed)
Subjective:    Patient ID: Aaron Fox, Aaron Fox    DOB: 01/29/1956, 64 y.o.   MRN: [id number]  HPI Pt returns for f/u of diabetes mellitus: DM type: 1 Dx'ed: 6063 Complications: PN, DR, PAD, and CRI.   Therapy: insulin since 2003.  DKA: never.  Severe hypoglycemia: never.   Pancreatitis: never.  SDOH: he takes human insulin, due to cost; he is disabled.   Other: he takes 2 QD insulins, after poor results with multiple daily injections.   Interval history: pt says cbg's vary from 63-226.  He has hypoglycemia approx once per week.  This happens at lunch.  Pt says he never misses the insulin.  No recent steroids.  Past Medical History:  Diagnosis Date  . ALLERGIC RHINITIS 07/02/2008  . Allergy   . Arthritis    RA  . Atrial fibrillation ([place])   . Cataract    removed both eyes   . CHF 06/19/2008  . COUGH DUE TO ACE INHIBITORS 10/05/2008  . DIABETES MELLITUS, TYPE II 09/22/2006  . Diabetic retinopathy of both eyes ([facility])    mild nonproliferative  . ED (erectile dysfunction)   . Glaucoma   . GOUT 09/22/2006  . Hx of adenomatous colonic polyps 06/09/2015  . HYPERCHOLESTEROLEMIA 07/02/2008  . HYPERTENSION 05/31/2009  . HYPOGONADISM, Aaron Fox 01/15/2007  . Hypokalemia   . 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 Lymphadenopathy   . Peripheral neuropathy   . PUD (peptic ulcer disease)   . Rheumatoid arthritis(714.0) 09/22/2006    Past Surgical History:  Procedure Laterality Date  . ACHILLES TENDON SURGERY Right 04/25/2019   Procedure: ACHILLES LENGTHENING;  Surgeon: Trula Slade, DPM;  Location: [facility];  Service: Podiatry;  Laterality: Right;  . AMPUTATION Right 04/25/2019   Procedure: AMPUTATION Brynda Peon;  Surgeon: Trula Slade, DPM;  Location: [facility];  Service: Podiatry;  Laterality: Right;  . CATARACT EXTRACTION Bilateral    Dr. Talbert Forest  . CATARACT EXTRACTION, BILATERAL    . COLONOSCOPY  2004  . LYMPH NODE BIOPSY Right 10/16/2014   Procedure: RIGHT INGUINAL  LYMPH NODE BIOPSY;  Surgeon: Aviva Signs, MD;  Location: [facility];  Service: General;  Laterality: Right;    Social History   Socioeconomic History  . Marital status: Married    Spouse name: Not on file  . Number of children: 3  . Years of education: Not on file  . Highest education level: Not on file  Occupational History  . Occupation: Disability    Employer: [employer]  Tobacco Use  . Smoking status: Former Smoker    Quit date: 11/02/1999    Years since quitting: 19.8  . Smokeless tobacco: Never Used  Substance and Sexual Activity  . Alcohol use: No    Alcohol/week: 0.0 standard drinks  . Drug use: No  . Sexual activity: Not on file  Other Topics Concern  . Not on file  Social History Narrative   Married, 3 kids   Prior work [employer] before disability   Regular exercise-yes   Social Determinants of Health   Financial Resource Strain:   . Difficulty of Paying Living Expenses:   Food Insecurity:   . Worried About Charity fundraiser in the Last Year:   . Arboriculturist in the Last Year:   Transportation Needs:   . Film/video editor (Medical):   Marland Kitchen Lack of Transportation (Non-Medical):   Physical Activity:   . Days of Exercise per Week:   .  Minutes of Exercise per Session:   Stress:   . Feeling of Stress :   Social Connections:   . Frequency of Communication with Friends and Family:   . Frequency of Social Gatherings with Friends and Family:   . Attends Religious Services:   . Active Member of Clubs or Organizations:   . Attends Archivist Meetings:   Marland Kitchen Marital Status:   Intimate Partner Violence:   . Fear of Current or Ex-Partner:   . Emotionally Abused:   Marland Kitchen Physically Abused:   . Sexually Abused:     Current Outpatient Medications on File Prior to Visit  Medication Sig Dispense Refill  . Accu-Chek Softclix Lancets lancets 1 each by Other route See admin instructions. Use to monitor glucose levels 7 times daily; E11.9; WILL NOT COMPLETE PA 630  each 0  . Alcohol Swabs (B-D SINGLE USE SWABS REGULAR) PADS Use to cleanse site prior to use of lancet to obtain droplet of blood two times per day; E10.49 200 each 2  . aspirin 81 MG tablet Take 81 mg by mouth daily.      Marland Kitchen atorvastatin (LIPITOR) 10 MG tablet Take 1 tablet (10 mg total) by mouth daily. 90 tablet 1  . Blood Glucose Monitoring Suppl (ACCU-CHEK GUIDE ME) w/Device KIT 1 each by Does not apply route See admin instructions. Use to monitor glucose levels 7 times daily; E11.9; WILL NOT COMPLETE PA 1 kit 0  . doxycycline (VIBRA-TABS) 100 MG tablet Take 1 tablet (100 mg total) by mouth 2 (two) times daily. 20 tablet 0  . gabapentin (NEURONTIN) 300 MG capsule Take 2 capsules (600 mg total) by mouth 3 (three) times daily. 540 capsule 2  . glucose blood (ACCU-CHEK GUIDE) test strip 1 each by Other route See admin instructions. Check CBG's 7 times daily; E11.9 630 strip 0  . ibuprofen (ADVIL) 200 MG tablet Take 400 mg by mouth every 8 (eight) hours as needed for headache or moderate pain.    . INSULIN SYRINGE 1CC/29G (MONOJECT ULTRA COMFORT SYRINGE) 29G X 1/2" 1 ML MISC 1 each by Other route in the morning and at bedtime. 180 each 3  . losartan (COZAAR) 50 MG tablet Take 1 tablet (50 mg total) by mouth daily. 30 tablet 0  . Tetrahydrozoline HCl (VISINE OP) Place 1 drop into both eyes daily as needed (redness).     No current facility-administered medications on file prior to visit.    Allergies  Allergen Reactions  . Penicillins Swelling    Swelling of the hands and feet, skin peeling Did it involve swelling of the face/tongue/throat, SOB, or low BP? No Did it involve sudden or severe rash/hives, skin peeling, or any reaction on the inside of your mouth or nose? No Did you need to seek medical attention at a hospital or doctor's office? Yes When did it last happen?10 + years If all above answers are "NO", may proceed with cephalosporin use.   . Ciprofloxacin Hives and Rash  .  Terbinafine Hcl Hives  . Zestril [Lisinopril] Cough    Family History  Problem Relation Age of Onset  . Heart disease Other        FH of CAD  . Colon polyps Paternal Uncle   . Kidney disease Paternal Uncle   . Diabetes Paternal Grandfather   . Diabetes Maternal Uncle   . Cancer Neg Hx   . Colon cancer Neg Hx   . Gallbladder disease Neg Hx   . Esophageal cancer  Neg Hx   . Rectal cancer Neg Hx   . Stomach cancer Neg Hx     BP 138/70   Pulse 71   Ht 5' 11.5" (1.816 m)   Wt 215 lb (97.5 kg)   SpO2 98%   BMI 29.57 kg/m    Review of Systems Denies LOC.  Inguinal itching has recurred    Objective:   Physical Exam VITAL SIGNS:  See vs page GENERAL: no distress Pulses: left foot foot pulses are intact MSK: no deformity of the left foot or ankle.  Right TMA CV: no edema of the left leg or ankle   Skin:  no ulcer on the left foot or ankle.  normal color and temp on the left foot and ankle Neuro: sensation is intact to touch on the left foot and ankle, but decreased from normal. Ext: There is severe bilateral onychomycosis of the left foot toenails.  A1c=5.8%     Assessment & Plan:  Heat rash, recurrent Type 1 DM, with PAD. Hypoglycemia, due to insulin: this limits aggressiveness of glycemic control   Patient Instructions  I have sent a prescription to your pharmacy, for the itching Please reduce the insulins by 2 units each, to the numbers listed below. check your blood sugar 7 times a day.  vary the time of day when you check, between before the 3 meals, and at bedtime.  also check if you have symptoms of your blood sugar being too high or too low.  please keep a record of the readings and bring it to your next appointment here (or you can bring the meter itself).  You can write it on any piece of paper.  please call us sooner if your blood sugar goes below 70, or if you have a lot of readings over 200. Please come back for a follow-up appointment in 3 months.

## 2019-09-12 NOTE — Patient Instructions (Addendum)
I have sent a prescription to your pharmacy, for the itching Please reduce the insulins by 2 units each, to the numbers listed below. check your blood sugar 7 times a day.  vary the time of day when you check, between before the 3 meals, and at bedtime.  also check if you have symptoms of your blood sugar being too high or too low.  please keep a record of the readings and bring it to your next appointment here (or you can bring the meter itself).  You can write it on any piece of paper.  please call us sooner if your blood sugar goes below 70, or if you have a lot of readings over 200. Please come back for a follow-up appointment in 3 months.

## 2019-09-23 ENCOUNTER — Other Ambulatory Visit: Payer: Self-pay

## 2019-09-23 ENCOUNTER — Ambulatory Visit: Payer: Medicare HMO | Admitting: Orthotics

## 2019-09-30 ENCOUNTER — Other Ambulatory Visit: Payer: Self-pay

## 2019-09-30 ENCOUNTER — Ambulatory Visit: Payer: Medicare HMO | Admitting: Podiatry

## 2019-09-30 ENCOUNTER — Ambulatory Visit (INDEPENDENT_AMBULATORY_CARE_PROVIDER_SITE_OTHER): Payer: Medicare HMO | Admitting: Orthotics

## 2019-09-30 DIAGNOSIS — L97514 Non-pressure chronic ulcer of other part of right foot with necrosis of bone: Secondary | ICD-10-CM

## 2019-09-30 DIAGNOSIS — M869 Osteomyelitis, unspecified: Secondary | ICD-10-CM | POA: Diagnosis not present

## 2019-09-30 DIAGNOSIS — Z9889 Other specified postprocedural states: Secondary | ICD-10-CM

## 2019-09-30 DIAGNOSIS — E119 Type 2 diabetes mellitus without complications: Secondary | ICD-10-CM

## 2019-09-30 DIAGNOSIS — E1149 Type 2 diabetes mellitus with other diabetic neurological complication: Secondary | ICD-10-CM

## 2019-09-30 NOTE — Progress Notes (Signed)
Patient came in today to pick up diabetic shoes and custom inserts ([id number]).  Same was well pleased with fit and function.   The patient could ambulate without any discomfort; there were no signs of any quality issues. The foot ortheses offered full contact with plantar surface and contoured the arch well.   The shoes fit well with no heel slippage and areas of pressure concern.   Patient advised to contact us if any problems arise.  Patient also advised on how to report any issues.

## 2019-09-30 NOTE — Progress Notes (Signed)
Subjective: Aaron Fox is a 64 y.o. is seen today in office s/p right TMA and Achilles tendon lengthening preformed on 04/25/2019.  States he has been doing well.  Is wearing regular shoes but he is awaiting diabetic shoes, inserts.  He is back to work and active without any issues.  Denies any new sores or any new concerns.   A1c 5.8 on 09/12/2019  Denies any fevers, chills, nausea, vomiting.  No calf pain, chest pain, shortness of breath.   Objective: General: No acute distress, AAOx3  DP, PT pulses palpable. RIGHT foot: Status post transmetatarsal amputation of right foot with incision well-healed.  There is no severe edema, erythema there is no skin breakdown identified.  Achilles tendon appears to be intact. LEFT foot: There is no skin breakdown identified. No pain with calf compression, swelling, warmth, erythema.    Assessment and Plan:  Status post right transmetatarsal amputation, Achilles tendon lengthening  -Treatment options discussed including all alternatives, risks, and complications -Swelling isresolved.  Discussed with him daily foot inspection moisturizer daily.  He also presents today to see Liliane Channel for specific diabetic shoes, inserts.  Return in about 3 months (around 12/31/2019).  Trula Slade DPM

## 2019-10-09 ENCOUNTER — Other Ambulatory Visit: Payer: Self-pay | Admitting: Endocrinology

## 2019-10-09 DIAGNOSIS — E11319 Type 2 diabetes mellitus with unspecified diabetic retinopathy without macular edema: Secondary | ICD-10-CM

## 2019-10-25 ENCOUNTER — Other Ambulatory Visit: Payer: Self-pay | Admitting: Endocrinology

## 2019-10-25 DIAGNOSIS — Z794 Long term (current) use of insulin: Secondary | ICD-10-CM

## 2019-11-11 ENCOUNTER — Ambulatory Visit: Payer: Medicare HMO | Admitting: Orthotics

## 2019-11-11 ENCOUNTER — Other Ambulatory Visit: Payer: Self-pay

## 2019-11-11 DIAGNOSIS — E1149 Type 2 diabetes mellitus with other diabetic neurological complication: Secondary | ICD-10-CM

## 2019-11-11 DIAGNOSIS — M869 Osteomyelitis, unspecified: Secondary | ICD-10-CM

## 2019-11-11 NOTE — Progress Notes (Signed)
Took material off bottom of f/o trans met filler.  This was so there would be more room in shoe as he was getting a blister on the dorsal aspect of residual foot.

## 2019-11-17 ENCOUNTER — Ambulatory Visit: Payer: Medicare HMO | Admitting: Orthotics

## 2019-11-19 ENCOUNTER — Ambulatory Visit: Payer: Medicare HMO | Admitting: Orthotics

## 2019-11-25 ENCOUNTER — Other Ambulatory Visit: Payer: Self-pay | Admitting: Family Medicine

## 2019-11-25 ENCOUNTER — Telehealth: Payer: Self-pay | Admitting: Endocrinology

## 2019-11-25 NOTE — Telephone Encounter (Signed)
Patient requesting refill on his losartan called into [facility] in [place] for a 30 day supply and a 90 day supply called into [facility].  CB# [phone]

## 2019-11-25 NOTE — Telephone Encounter (Signed)
[facility] called wrong doctors office for a refill from Dr Posey Pronto - closing in error

## 2019-11-26 ENCOUNTER — Telehealth: Payer: Self-pay | Admitting: Family Medicine

## 2019-11-26 MED ORDER — LOSARTAN POTASSIUM 50 MG PO TABS: 50.0000 mg | ORAL_TABLET | Freq: Every day | ORAL | 0 refills | Status: DC

## 2019-11-26 MED ORDER — LOSARTAN POTASSIUM 50 MG PO TABS: 50.0000 mg | ORAL_TABLET | Freq: Every day | ORAL | 1 refills | Status: DC

## 2019-11-26 NOTE — Telephone Encounter (Signed)
Prescription sent to pharmacy.

## 2019-11-26 NOTE — Telephone Encounter (Signed)
Refills sent to pharmacy in another message.

## 2019-11-26 NOTE — Telephone Encounter (Signed)
Jessica from [facility] left vm stating patient needed a short supply o fhis losartan 100 mg called into [facility] in [place] until he can get the 90 day supply from [facility].  CB# [phone]

## 2019-12-19 ENCOUNTER — Other Ambulatory Visit: Payer: Self-pay

## 2019-12-19 ENCOUNTER — Encounter: Payer: Self-pay | Admitting: Endocrinology

## 2019-12-19 ENCOUNTER — Ambulatory Visit: Payer: Medicare HMO | Admitting: Endocrinology

## 2019-12-19 VITALS — BP 140/80 | HR 76 | Ht 71.5 in | Wt 219.0 lb

## 2019-12-19 DIAGNOSIS — E11319 Type 2 diabetes mellitus with unspecified diabetic retinopathy without macular edema: Secondary | ICD-10-CM | POA: Diagnosis not present

## 2019-12-19 DIAGNOSIS — Z23 Encounter for immunization: Secondary | ICD-10-CM | POA: Diagnosis not present

## 2019-12-19 DIAGNOSIS — Z794 Long term (current) use of insulin: Secondary | ICD-10-CM | POA: Diagnosis not present

## 2019-12-19 LAB — POCT GLYCOSYLATED HEMOGLOBIN (HGB A1C): Hemoglobin A1C: 6.7 % — AB (ref 4.0–5.6)

## 2019-12-19 MED ORDER — INSULIN NPH (HUMAN) (ISOPHANE) 100 UNIT/ML ~~LOC~~ SUSP: 23.0000 [IU] | SUBCUTANEOUS | 2 refills | Status: DC

## 2019-12-19 NOTE — Patient Instructions (Addendum)
Please reduce the NPH to 23 units each morning, and Please continue the same Reg insulin. You should avoid the Advil, as it is not good for your kidneys.   check your blood sugar 7 times a day.  vary the time of day when you check, between before the 3 meals, and at bedtime.  also check if you have symptoms of your blood sugar being too high or too low.  please keep a record of the readings and bring it to your next appointment here (or you can bring the meter itself).  You can write it on any piece of paper.  please call us sooner if your blood sugar goes below 70, or if you have a lot of readings over 200. Please come back for a follow-up appointment in 3 months.

## 2019-12-19 NOTE — Progress Notes (Signed)
Subjective:    Patient ID: Aaron Fox, male    DOB: 1955/05/08, 64 y.o.   MRN: [id number]  HPI Pt returns for f/u of diabetes mellitus: DM type: 1 Dx'ed: 4315 Complications: PN, DR, PAD, and CRI.   Therapy: insulin since 2003.  DKA: never.  Severe hypoglycemia: never.   Pancreatitis: never.  SDOH: he takes human insulin, due to cost; he is disabled.   Other: he takes 2 QD insulins, after poor results with multiple daily injections.   Interval history: pt says cbg's vary from 63-226.  He has hypoglycemia approx twice per month.  This happens at lunch.  Pt says he never misses the insulin.  No recent steroids.  Past Medical History:  Diagnosis Date  . ALLERGIC RHINITIS 07/02/2008  . Allergy   . Arthritis    RA  . Atrial fibrillation ([place])   . Cataract    removed both eyes   . CHF 06/19/2008  . COUGH DUE TO ACE INHIBITORS 10/05/2008  . DIABETES MELLITUS, TYPE II 09/22/2006  . Diabetic retinopathy of both eyes ([facility])    mild nonproliferative  . ED (erectile dysfunction)   . Glaucoma   . GOUT 09/22/2006  . Hx of adenomatous colonic polyps 06/09/2015  . HYPERCHOLESTEROLEMIA 07/02/2008  . HYPERTENSION 05/31/2009  . HYPOGONADISM, MALE 01/15/2007  . Hypokalemia   . 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 Lymphadenopathy   . Peripheral neuropathy   . PUD (peptic ulcer disease)   . Rheumatoid arthritis(714.0) 09/22/2006    Past Surgical History:  Procedure Laterality Date  . ACHILLES TENDON SURGERY Right 04/25/2019   Procedure: ACHILLES LENGTHENING;  Surgeon: Trula Slade, DPM;  Location: [facility];  Service: Podiatry;  Laterality: Right;  . AMPUTATION Right 04/25/2019   Procedure: AMPUTATION Brynda Peon;  Surgeon: Trula Slade, DPM;  Location: [facility];  Service: Podiatry;  Laterality: Right;  . CATARACT EXTRACTION Bilateral    Dr. Talbert Forest  . CATARACT EXTRACTION, BILATERAL    . COLONOSCOPY  2004  . LYMPH NODE BIOPSY Right 10/16/2014   Procedure: RIGHT INGUINAL  LYMPH NODE BIOPSY;  Surgeon: Aviva Signs, MD;  Location: [facility];  Service: General;  Laterality: Right;    Social History   Socioeconomic History  . Marital status: Married    Spouse name: Not on file  . Number of children: 3  . Years of education: Not on file  . Highest education level: Not on file  Occupational History  . Occupation: Disability    Employer: [employer]  Tobacco Use  . Smoking status: Former Smoker    Quit date: 11/02/1999    Years since quitting: 20.1  . Smokeless tobacco: Never Used  Substance and Sexual Activity  . Alcohol use: No    Alcohol/week: 0.0 standard drinks  . Drug use: No  . Sexual activity: Not on file  Other Topics Concern  . Not on file  Social History Narrative   Married, 3 kids   Prior work [employer] before disability   Regular exercise-yes   Social Determinants of Health   Financial Resource Strain:   . Difficulty of Paying Living Expenses: Not on file  Food Insecurity:   . Worried About Charity fundraiser in the Last Year: Not on file  . Ran Out of Food in the Last Year: Not on file  Transportation Needs:   . Lack of Transportation (Medical): Not on file  . Lack of Transportation (Non-Medical): Not on file  Physical Activity:   .  Days of Exercise per Week: Not on file  . Minutes of Exercise per Session: Not on file  Stress:   . Feeling of Stress : Not on file  Social Connections:   . Frequency of Communication with Friends and Family: Not on file  . Frequency of Social Gatherings with Friends and Family: Not on file  . Attends Religious Services: Not on file  . Active Member of Clubs or Organizations: Not on file  . Attends Archivist Meetings: Not on file  . Marital Status: Not on file  Intimate Partner Violence:   . Fear of Current or Ex-Partner: Not on file  . Emotionally Abused: Not on file  . Physically Abused: Not on file  . Sexually Abused: Not on file    Current Outpatient Medications on File Prior to  Visit  Medication Sig Dispense Refill  . Accu-Chek Softclix Lancets lancets USE TO MONITOR GLUCOSE LEVELS 7 TIMES DAILY 700 each 3  . Alcohol Swabs (B-D SINGLE USE SWABS REGULAR) PADS Use to cleanse site prior to use of lancet to obtain droplet of blood two times per day; E10.49 200 each 2  . aspirin 81 MG tablet Take 81 mg by mouth daily.      Marland Kitchen atorvastatin (LIPITOR) 10 MG tablet Take 1 tablet (10 mg total) by mouth daily. 90 tablet 1  . Blood Glucose Monitoring Suppl (ACCU-CHEK GUIDE ME) w/Device KIT 1 each by Does not apply route See admin instructions. Use to monitor glucose levels 7 times daily; E11.9; WILL NOT COMPLETE PA 1 kit 0  . gabapentin (NEURONTIN) 300 MG capsule Take 2 capsules (600 mg total) by mouth 3 (three) times daily. 540 capsule 2  . glucose blood (ACCU-CHEK GUIDE) test strip 1 each by Other route See admin instructions. Check blood sugars 7 times daily; E11.9 630 strip 0  . insulin regular (NOVOLIN R RELION) 100 units/mL injection Inject 0.04 mLs (4 Units total) into the skin daily with supper. And syringes 2/day 10 mL 11  . INSULIN SYRINGE 1CC/29G (MONOJECT ULTRA COMFORT SYRINGE) 29G X 1/2" 1 ML MISC 1 each by Other route in the morning and at bedtime. 180 each 3  . losartan (COZAAR) 50 MG tablet Take 1 tablet (50 mg total) by mouth daily. 90 tablet 1  . Tetrahydrozoline HCl (VISINE OP) Place 1 drop into both eyes daily as needed (redness).    . triamcinolone cream (KENALOG) 0.1 % Apply 1 application topically 3 (three) times daily. As needed for rash. 45 g 2   No current facility-administered medications on file prior to visit.     Family History  Problem Relation Age of Onset  . Heart disease Other        FH of CAD  . Colon polyps Paternal Uncle   . Kidney disease Paternal Uncle   . Diabetes Paternal Grandfather   . Diabetes Maternal Uncle   . Cancer Neg Hx   . Colon cancer Neg Hx   . Gallbladder disease Neg Hx   . Esophageal cancer Neg Hx   . Rectal cancer  Neg Hx   . Stomach cancer Neg Hx     BP 140/80   Pulse 76   Ht 5' 11.5" (1.816 m)   Wt 219 lb (99.3 kg)   SpO2 96%   BMI 30.12 kg/m    Review of Systems     Objective:   Physical Exam VITAL SIGNS:  See vs page GENERAL: no distress Pulses: left foot foot pulses  are intact MSK: no deformity of the left foot or ankle.  Right TMA CV: no edema of the left leg or ankle   Skin:  no ulcer on the left foot or ankle.  normal color and temp on the left foot and ankle Neuro: sensation is intact to touch on the left foot and ankle, but decreased from normal. Ext: There is severe bilateral onychomycosis of the left foot toenails.   Lab Results  Component Value Date   HGBA1C 6.7 (A) 12/19/2019       Assessment & Plan:  Type 1 DM, with PAD Hypoglycemia, due to insulin: this limits aggressiveness of glycemic control  Patient Instructions  Please reduce the NPH to 23 units each morning, and Please continue the same Reg insulin. You should avoid the Advil, as it is not good for your kidneys.   check your blood sugar 7 times a day.  vary the time of day when you check, between before the 3 meals, and at bedtime.  also check if you have symptoms of your blood sugar being too high or too low.  please keep a record of the readings and bring it to your next appointment here (or you can bring the meter itself).  You can write it on any piece of paper.  please call us sooner if your blood sugar goes below 70, or if you have a lot of readings over 200. Please come back for a follow-up appointment in 3 months.

## 2019-12-29 ENCOUNTER — Other Ambulatory Visit: Payer: Self-pay | Admitting: Endocrinology

## 2019-12-29 DIAGNOSIS — E11319 Type 2 diabetes mellitus with unspecified diabetic retinopathy without macular edema: Secondary | ICD-10-CM

## 2019-12-30 ENCOUNTER — Other Ambulatory Visit: Payer: Self-pay

## 2019-12-30 ENCOUNTER — Ambulatory Visit: Payer: Medicare HMO | Admitting: Podiatry

## 2019-12-30 DIAGNOSIS — M79675 Pain in left toe(s): Secondary | ICD-10-CM | POA: Diagnosis not present

## 2019-12-30 DIAGNOSIS — B351 Tinea unguium: Secondary | ICD-10-CM

## 2019-12-30 DIAGNOSIS — E1149 Type 2 diabetes mellitus with other diabetic neurological complication: Secondary | ICD-10-CM | POA: Diagnosis not present

## 2019-12-30 NOTE — Patient Instructions (Signed)
Diabetes Mellitus and Foot Care Foot care is an important part of your health, especially when you have diabetes. Diabetes may cause you to have problems because of poor blood flow (circulation) to your feet and legs, which can cause your skin to:  Become thinner and drier.  Break more easily.  Heal more slowly.  Peel and crack. You may also have nerve damage (neuropathy) in your legs and feet, causing decreased feeling in them. This means that you may not notice minor injuries to your feet that could lead to more serious problems. Noticing and addressing any potential problems early is the best way to prevent future foot problems. How to care for your feet Foot hygiene  Wash your feet daily with warm water and mild soap. Do not use hot water. Then, pat your feet and the areas between your toes until they are completely dry. Do not soak your feet as this can dry your skin.  Trim your toenails straight across. Do not dig under them or around the cuticle. File the edges of your nails with an emery board or nail file.  Apply a moisturizing lotion or petroleum jelly to the skin on your feet and to dry, brittle toenails. Use lotion that does not contain alcohol and is unscented. Do not apply lotion between your toes. Shoes and socks  Wear clean socks or stockings every day. Make sure they are not too tight. Do not wear knee-high stockings since they may decrease blood flow to your legs.  Wear shoes that fit properly and have enough cushioning. Always look in your shoes before you put them on to be sure there are no objects inside.  To break in new shoes, wear them for just a few hours a day. This prevents injuries on your feet. Wounds, scrapes, corns, and calluses  Check your feet daily for blisters, cuts, bruises, sores, and redness. If you cannot see the bottom of your feet, use a mirror or ask someone for help.  Do not cut corns or calluses or try to remove them with medicine.  If you  find a minor scrape, cut, or break in the skin on your feet, keep it and the skin around it clean and dry. You may clean these areas with mild soap and water. Do not clean the area with peroxide, alcohol, or iodine.  If you have a wound, scrape, corn, or callus on your foot, look at it several times a day to make sure it is healing and not infected. Check for: ? Redness, swelling, or pain. ? Fluid or blood. ? Warmth. ? Pus or a bad smell. General instructions  Do not cross your legs. This may decrease blood flow to your feet.  Do not use heating pads or hot water bottles on your feet. They may burn your skin. If you have lost feeling in your feet or legs, you may not know this is happening until it is too late.  Protect your feet from hot and cold by wearing shoes, such as at the beach or on hot pavement.  Schedule a complete foot exam at least once a year (annually) or more often if you have foot problems. If you have foot problems, report any cuts, sores, or bruises to your health care provider immediately. Contact a health care provider if:  You have a medical condition that increases your risk of infection and you have any cuts, sores, or bruises on your feet.  You have an injury that is not   healing.  You have redness on your legs or feet.  You feel burning or tingling in your legs or feet.  You have pain or cramps in your legs and feet.  Your legs or feet are numb.  Your feet always feel cold.  You have pain around a toenail. Get help right away if:  You have a wound, scrape, corn, or callus on your foot and: ? You have pain, swelling, or redness that gets worse. ? You have fluid or blood coming from the wound, scrape, corn, or callus. ? Your wound, scrape, corn, or callus feels warm to the touch. ? You have pus or a bad smell coming from the wound, scrape, corn, or callus. ? You have a fever. ? You have a red line going up your leg. Summary  Check your feet every day  for cuts, sores, red spots, swelling, and blisters.  Moisturize feet and legs daily.  Wear shoes that fit properly and have enough cushioning.  If you have foot problems, report any cuts, sores, or bruises to your health care provider immediately.  Schedule a complete foot exam at least once a year (annually) or more often if you have foot problems. This information is not intended to replace advice given to you by your health care provider. Make sure you discuss any questions you have with your health care provider. Document Revised: 11/06/2018 Document Reviewed: 03/17/2016 Elsevier Patient Education  2020 [employer]  

## 2019-12-31 NOTE — Progress Notes (Signed)
Subjective: 64 y.o. returns the office today for painful, elongated, thickened toenails which he cannot trim himself and for diabetic foot exam.  Previously underwent right transmetatarsal imitation which he states is healed well he has no ulcerations he reports.  No significant increase in swelling.  He has no other concerns. Denies any systemic complaints such as fevers, chills, nausea, vomiting.   PCP: [name], MD A1c: 6.7 on 12/19/2019  Objective: AAO 3, NAD DP/PT pulses palpable, CRT less than 3 seconds Protective sensation decreased with Simms Weinstein monofilament Nails hypertrophic, dystrophic, elongated, brittle, discolored 5. There is tenderness overlying the nails 1-5 on the left. There is no surrounding erythema or drainage along the nail sites. Transmetatarsal imitation site well-healed on the right side.  Is no open lesions bilaterally. No other areas of tenderness bilateral lower extremities. No overlying edema, erythema, increased warmth. No pain with calf compression, swelling, warmth, erythema.  Assessment: Patient presents with symptomatic onychomycosis  Plan: -Treatment options including alternatives, risks, complications were discussed -Nails sharply debrided 5 without complication/bleeding. -Discussed daily foot inspection. If there are any changes, to call the office immediately.  -Follow-up in 3 months or sooner if any problems are to arise. In the meantime, encouraged to call the office with any questions, concerns, changes symptoms.  Celesta Gentile, DPM

## 2020-01-01 ENCOUNTER — Other Ambulatory Visit: Payer: Self-pay | Admitting: Endocrinology

## 2020-01-01 DIAGNOSIS — E11319 Type 2 diabetes mellitus with unspecified diabetic retinopathy without macular edema: Secondary | ICD-10-CM

## 2020-01-24 ENCOUNTER — Other Ambulatory Visit: Payer: Self-pay | Admitting: Endocrinology

## 2020-03-02 ENCOUNTER — Ambulatory Visit: Payer: Medicare HMO | Admitting: Orthotics

## 2020-03-02 ENCOUNTER — Other Ambulatory Visit: Payer: Self-pay

## 2020-03-02 ENCOUNTER — Ambulatory Visit (INDEPENDENT_AMBULATORY_CARE_PROVIDER_SITE_OTHER): Payer: Medicare HMO | Admitting: Podiatry

## 2020-03-02 DIAGNOSIS — Z89439 Acquired absence of unspecified foot: Secondary | ICD-10-CM | POA: Diagnosis not present

## 2020-03-02 DIAGNOSIS — E1149 Type 2 diabetes mellitus with other diabetic neurological complication: Secondary | ICD-10-CM

## 2020-03-02 DIAGNOSIS — B351 Tinea unguium: Secondary | ICD-10-CM

## 2020-03-02 DIAGNOSIS — M2042 Other hammer toe(s) (acquired), left foot: Secondary | ICD-10-CM

## 2020-03-02 DIAGNOSIS — M79675 Pain in left toe(s): Secondary | ICD-10-CM | POA: Diagnosis not present

## 2020-03-02 DIAGNOSIS — E119 Type 2 diabetes mellitus without complications: Secondary | ICD-10-CM | POA: Diagnosis not present

## 2020-03-02 DIAGNOSIS — Z9889 Other specified postprocedural states: Secondary | ICD-10-CM

## 2020-03-02 NOTE — Progress Notes (Signed)
Patient was measured for med necessity extra depth shoes (x2) w/ 1 toefiller $ and custom custom f/o. Patient was measured w/ brannock to determine size and width.  Foam impression mold was achieved and deemed appropriate for fabrication of  cmfo.   See DPM chart notes for further documentation and dx codes for determination of medical necessity.  Appropriate forms will be sent to PCP to verify and sign off on medical necessity.

## 2020-03-02 NOTE — Progress Notes (Signed)
Subjective: 65 y.o. returns the office today for painful, elongated, thickened toenails which he cannot trim himself. Denies any redness or drainage around the nails.  Presents today also for diabetic foot exam.  Denies any open sores.  He also that he measured for diabetic shoes today.  Denies any acute changes since last appointment and no new complaints today. Denies any systemic complaints such as fevers, chills, nausea, vomiting.   PCP: Donita Brooks, MD  Endocrinologist: Romero Belling, MD last seen 12/19/2019  A1c: 6.7, last blood sugar was 136.  Objective: AAO 3, NAD DP/PT pulses palpable, CRT less than 3 seconds Protective sensation decreased with Simms Weinstein monofilament Nails hypertrophic, dystrophic, elongated, brittle, discolored 5. There is tenderness overlying the nails 1-5 bilaterally. There is no surrounding erythema or drainage along the nail sites. Hammertoes present on the left side and transmetatarsal mutation on the right side. No pain with calf compression, swelling, warmth, erythema.  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 -Measurement for diabetic shoes today. -Discussed daily foot inspection. If there are any changes, to call the office immediately.  -Follow-up in 3 months or sooner if any problems are to arise. In the meantime, encouraged to call the office with any questions, concerns, changes symptoms.  Ovid Curd, DPM

## 2020-04-07 ENCOUNTER — Other Ambulatory Visit: Payer: Self-pay

## 2020-04-09 ENCOUNTER — Ambulatory Visit: Payer: Medicare HMO | Admitting: Endocrinology

## 2020-04-09 ENCOUNTER — Encounter: Payer: Self-pay | Admitting: Endocrinology

## 2020-04-09 ENCOUNTER — Other Ambulatory Visit: Payer: Self-pay

## 2020-04-09 VITALS — BP 120/78 | HR 71 | Ht 71.0 in | Wt 234.2 lb

## 2020-04-09 DIAGNOSIS — E11319 Type 2 diabetes mellitus with unspecified diabetic retinopathy without macular edema: Secondary | ICD-10-CM | POA: Diagnosis not present

## 2020-04-09 DIAGNOSIS — Z794 Long term (current) use of insulin: Secondary | ICD-10-CM | POA: Diagnosis not present

## 2020-04-09 DIAGNOSIS — E1151 Type 2 diabetes mellitus with diabetic peripheral angiopathy without gangrene: Secondary | ICD-10-CM | POA: Diagnosis not present

## 2020-04-09 DIAGNOSIS — I509 Heart failure, unspecified: Secondary | ICD-10-CM | POA: Diagnosis not present

## 2020-04-09 LAB — POCT GLYCOSYLATED HEMOGLOBIN (HGB A1C): Hemoglobin A1C: 9.7 % — AB (ref 4.0–5.6)

## 2020-04-09 NOTE — Progress Notes (Signed)
Subjective:    Patient ID: Aaron Fox, male    DOB: 1955-09-25, 65 y.o.   MRN: [id number]  HPI Pt returns for f/u of diabetes mellitus: DM type: 1 Dx'ed: 9937 Complications: PN, DR, PAD, and CRI.   Therapy: insulin since 2003.  DKA: never.  Severe hypoglycemia: never.   Pancreatitis: never.  SDOH: he takes human insulin, due to cost; he is disabled.   Other: he takes 2 QD insulins, after poor results with multiple daily injections.   Interval history: no cbg record, but states cbg's vary from 78-200.  Pt says he never misses the insulin.  No recent steroids. Pt says A1c is higher due to poor diet.   Past Medical History:  Diagnosis Date  . ALLERGIC RHINITIS 07/02/2008  . Allergy   . Arthritis    RA  . Atrial fibrillation ([place])   . Cataract    removed both eyes   . CHF 06/19/2008  . COUGH DUE TO ACE INHIBITORS 10/05/2008  . DIABETES MELLITUS, TYPE II 09/22/2006  . Diabetic retinopathy of both eyes ([facility])    mild nonproliferative  . ED (erectile dysfunction)   . Glaucoma   . GOUT 09/22/2006  . Hx of adenomatous colonic polyps 06/09/2015  . HYPERCHOLESTEROLEMIA 07/02/2008  . HYPERTENSION 05/31/2009  . HYPOGONADISM, MALE 01/15/2007  . Hypokalemia   . 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 Lymphadenopathy   . Peripheral neuropathy   . PUD (peptic ulcer disease)   . Rheumatoid arthritis(714.0) 09/22/2006    Past Surgical History:  Procedure Laterality Date  . ACHILLES TENDON SURGERY Right 04/25/2019   Procedure: ACHILLES LENGTHENING;  Surgeon: Trula Slade, DPM;  Location: [facility];  Service: Podiatry;  Laterality: Right;  . AMPUTATION Right 04/25/2019   Procedure: AMPUTATION Brynda Peon;  Surgeon: Trula Slade, DPM;  Location: [facility];  Service: Podiatry;  Laterality: Right;  . CATARACT EXTRACTION Bilateral    Dr. Talbert Forest  . CATARACT EXTRACTION, BILATERAL    . COLONOSCOPY  2004  . LYMPH NODE BIOPSY Right 10/16/2014   Procedure: RIGHT INGUINAL LYMPH NODE  BIOPSY;  Surgeon: Aviva Signs, MD;  Location: [facility];  Service: General;  Laterality: Right;    Social History   Socioeconomic History  . Marital status: Married    Spouse name: Not on file  . Number of children: 3  . Years of education: Not on file  . Highest education level: Not on file  Occupational History  . Occupation: Disability    Employer: [employer]  Tobacco Use  . Smoking status: Former Smoker    Quit date: 11/02/1999    Years since quitting: 20.4  . Smokeless tobacco: Never Used  Substance and Sexual Activity  . Alcohol use: No    Alcohol/week: 0.0 standard drinks  . Drug use: No  . Sexual activity: Not on file  Other Topics Concern  . Not on file  Social History Narrative   Married, 3 kids   Prior work [employer] before disability   Regular exercise-yes   Social Determinants of Health   Financial Resource Strain: Not on file  Food Insecurity: Not on file  Transportation Needs: Not on file  Physical Activity: Not on file  Stress: Not on file  Social Connections: Not on file  Intimate Partner Violence: Not on file    Current Outpatient Medications on File Prior to Visit  Medication Sig Dispense Refill  . ACCU-CHEK GUIDE test strip TEST BLOOD SUGAR 7 TIMES DAILY 600 strip  3  . Accu-Chek Softclix Lancets lancets USE TO MONITOR GLUCOSE LEVELS 7 TIMES DAILY 600 each 3  . Alcohol Swabs (B-D SINGLE USE SWABS REGULAR) PADS Use to cleanse site prior to use of lancet to obtain droplet of blood two times per day; E10.49 200 each 2  . aspirin 81 MG tablet Take 81 mg by mouth daily.    Marland Kitchen atorvastatin (LIPITOR) 10 MG tablet Take 1 tablet (10 mg total) by mouth daily. 90 tablet 1  . Blood Glucose Monitoring Suppl (ACCU-CHEK GUIDE ME) w/Device KIT 1 each by Does not apply route See admin instructions. Use to monitor glucose levels 7 times daily; E11.9; WILL NOT COMPLETE PA 1 kit 0  . DROPLET INSULIN SYRINGE 29G X 1/2" 1 ML MISC USE IN THE MORNING AND AT BEDTIME 200 each 3   . gabapentin (NEURONTIN) 300 MG capsule Take 2 capsules (600 mg total) by mouth 3 (three) times daily. 540 capsule 2  . insulin NPH Human (NOVOLIN N) 100 UNIT/ML injection Inject 0.23 mLs (23 Units total) into the skin every morning. 30 mL 2  . insulin regular (NOVOLIN R RELION) 100 units/mL injection Inject 0.04 mLs (4 Units total) into the skin daily with supper. And syringes 2/day 10 mL 11  . losartan (COZAAR) 50 MG tablet Take 1 tablet (50 mg total) by mouth daily. 90 tablet 1  . Tetrahydrozoline HCl (VISINE OP) Place 1 drop into both eyes daily as needed (redness).    . triamcinolone cream (KENALOG) 0.1 % Apply 1 application topically 3 (three) times daily. As needed for rash. 45 g 2   No current facility-administered medications on file prior to visit.    Allergies  Allergen Reactions  . Penicillins Swelling    Swelling of the hands and feet, skin peeling Did it involve swelling of the face/tongue/throat, SOB, or low BP? No Did it involve sudden or severe rash/hives, skin peeling, or any reaction on the inside of your mouth or nose? No Did you need to seek medical attention at a hospital or doctor's office? Yes When did it last happen?10 + years If all above answers are "NO", may proceed with cephalosporin use.   . Ciprofloxacin Hives and Rash  . Terbinafine Hcl Hives  . Zestril [Lisinopril] Cough    Family History  Problem Relation Age of Onset  . Heart disease Other        FH of CAD  . Colon polyps Paternal Uncle   . Kidney disease Paternal Uncle   . Diabetes Paternal Grandfather   . Diabetes Maternal Uncle   . Cancer Neg Hx   . Colon cancer Neg Hx   . Gallbladder disease Neg Hx   . Esophageal cancer Neg Hx   . Rectal cancer Neg Hx   . Stomach cancer Neg Hx     BP 120/78   Pulse 71   Ht 5' 11"  (1.803 m)   Wt 234 lb 3.2 oz (106.2 kg)   SpO2 96%   BMI 32.66 kg/m    Review of Systems  He seldom has hypoglycemia, and these episodes are mild.      Objective:   Physical Exam VITAL SIGNS:  See vs page GENERAL: no distress Pulses: DP pulses are absent bilaterally MSK: no deformity of the left foot or ankle.  Right TMA CV: no edema of the left leg or ankle   Skin:  no ulcer on the left foot or ankle.  normal color and temp on the left foot  and ankle Neuro: sensation is intact to touch on the left foot and ankle, but decreased from normal. Ext: There is severe bilateral onychomycosis of the left foot toenails.  Lab Results  Component Value Date   HGBA1C 9.7 (A) 04/09/2020       Assessment & Plan:  Type 1 DM, with PAD: uncontrolled.  I advised pt to increase insulin, but he declines.  Patient Instructions  Please continue the same 2 insulins.   check your blood sugar 7 times a day.  vary the time of day when you check, between before the 3 meals, and at bedtime.  also check if you have symptoms of your blood sugar being too high or too low.  please keep a record of the readings and bring it to your next appointment here (or you can bring the meter itself).  You can write it on any piece of paper.  please call us sooner if your blood sugar goes below 70, or if you have a lot of readings over 200. Please come back for a follow-up appointment in 2 months.

## 2020-04-09 NOTE — Patient Instructions (Addendum)
Please continue the same 2 insulins.   check your blood sugar 7 times a day.  vary the time of day when you check, between before the 3 meals, and at bedtime.  also check if you have symptoms of your blood sugar being too high or too low.  please keep a record of the readings and bring it to your next appointment here (or you can bring the meter itself).  You can write it on any piece of paper.  please call us sooner if your blood sugar goes below 70, or if you have a lot of readings over 200. Please come back for a follow-up appointment in 2 months.

## 2020-04-22 ENCOUNTER — Other Ambulatory Visit: Payer: Self-pay | Admitting: Endocrinology

## 2020-04-26 NOTE — Telephone Encounter (Signed)
Patient called to request the following RX (Patient states PHARM sent request last week but did not get a response):  MEDICATION: triamcinolone cream (KENALOG) 0.1 %  PHARMACY:   [place], [place] Phone:  [phone]  Fax:  [phone]       HAS THE PATIENT CONTACTED THEIR PHARMACY?  Yes  IS THIS A 90 DAY SUPPLY :  Yes  IS PATIENT OUT OF MEDICATION: No  IF NOT; HOW MUCH IS LEFT: Approx. 5-7 days  LAST APPOINTMENT DATE: @2 /12/2020  NEXT APPOINTMENT DATE:@4 /09/2020  DO WE HAVE YOUR PERMISSION TO LEAVE A DETAILED MESSAGE?: Yes  OTHER COMMENTS:    **Let patient know to contact pharmacy at the end of the day to make sure medication is ready. **  ** Please notify patient to allow 48-72 hours to process**  **Encourage patient to contact the pharmacy for refills or they can request refills through [facility]**

## 2020-04-27 NOTE — Telephone Encounter (Signed)
Please advise. Should this go to PCP?

## 2020-04-28 ENCOUNTER — Telehealth: Payer: Self-pay | Admitting: Podiatry

## 2020-04-28 NOTE — Telephone Encounter (Signed)
Pt called checking status of diabetic shoes. I explained that we have received one page of the documents needed but not the other. We have a hard time getting paperwork from Dr Loanne Drilling for diabetic shoes.

## 2020-04-29 ENCOUNTER — Telehealth: Payer: Self-pay

## 2020-04-29 NOTE — Telephone Encounter (Signed)
Endocrinology Night-Client  Pt is requesting some shoes that he needs.  [facility] is waiting for the signed formes permitting the pt to get inserts and shoes.  Please Advise

## 2020-04-29 NOTE — Telephone Encounter (Signed)
Has been scanned into epic.  If they send another form, I will sign it.

## 2020-04-30 NOTE — Telephone Encounter (Signed)
Please refer to primary care provider

## 2020-05-01 NOTE — Telephone Encounter (Signed)
Please refill for pt.

## 2020-05-03 NOTE — Telephone Encounter (Signed)
Spoke with pt and he stated that he will contact [place] to send over another form

## 2020-05-03 NOTE — Telephone Encounter (Signed)
Pt called office back to let us know [place] will be faxing over the forms again today and that both (2 pages) need to be signed by Dr. Abbott Pao also requests the nurse give him a call back once she receives the forms so that he knows they were received. [phone]

## 2020-05-03 NOTE — Telephone Encounter (Signed)
Noted  

## 2020-05-04 ENCOUNTER — Telehealth: Payer: Self-pay | Admitting: Podiatry

## 2020-05-04 ENCOUNTER — Telehealth: Payer: Self-pay | Admitting: Family Medicine

## 2020-05-04 DIAGNOSIS — E11628 Type 2 diabetes mellitus with other skin complications: Secondary | ICD-10-CM

## 2020-05-04 DIAGNOSIS — IMO0002 Reserved for concepts with insufficient information to code with codable children: Secondary | ICD-10-CM

## 2020-05-04 NOTE — Telephone Encounter (Signed)
Received a call from Dr Cordelia Pen office regarding the diabetic shoe paperwork. The assistant said Dr Loanne Drilling signed off on the certifying physicians form but does not sign the other form([facility]) and he is refusing to sign it. She even went back in there while I was on the phone explaining that these are required by medicare for pt to get his shoes. If he does not sign it pt is not able to get shoes/inserts. He is aware of this and she was calling pt to notify pt that Dr Loanne Drilling is not signing the form.

## 2020-05-04 NOTE — Telephone Encounter (Signed)
Patient called in requesting a new referral to a different endocrinologist he states that Dr. Loanne Drilling isn't working out. He has been waiting on diabetic shoes since November to get an order.   CB# [phone]

## 2020-05-05 NOTE — Telephone Encounter (Signed)
Ok to place new referral 

## 2020-05-06 NOTE — Telephone Encounter (Signed)
ok 

## 2020-05-06 NOTE — Telephone Encounter (Signed)
Referral orders placed

## 2020-05-07 ENCOUNTER — Encounter: Payer: Self-pay | Admitting: Family Medicine

## 2020-05-07 ENCOUNTER — Other Ambulatory Visit: Payer: Self-pay

## 2020-05-07 ENCOUNTER — Ambulatory Visit (INDEPENDENT_AMBULATORY_CARE_PROVIDER_SITE_OTHER): Payer: Medicare HMO | Admitting: Family Medicine

## 2020-05-07 ENCOUNTER — Telehealth: Payer: Self-pay | Admitting: Podiatry

## 2020-05-07 VITALS — BP 128/74 | HR 76 | Temp 98.1°F | Resp 14 | Ht 71.0 in | Wt 221.0 lb

## 2020-05-07 DIAGNOSIS — E11628 Type 2 diabetes mellitus with other skin complications: Secondary | ICD-10-CM

## 2020-05-07 DIAGNOSIS — Z794 Long term (current) use of insulin: Secondary | ICD-10-CM

## 2020-05-07 DIAGNOSIS — Z125 Encounter for screening for malignant neoplasm of prostate: Secondary | ICD-10-CM | POA: Diagnosis not present

## 2020-05-07 DIAGNOSIS — Z8619 Personal history of other infectious and parasitic diseases: Secondary | ICD-10-CM

## 2020-05-07 DIAGNOSIS — IMO0002 Reserved for concepts with insufficient information to code with codable children: Secondary | ICD-10-CM

## 2020-05-07 DIAGNOSIS — E1165 Type 2 diabetes mellitus with hyperglycemia: Secondary | ICD-10-CM | POA: Diagnosis not present

## 2020-05-07 DIAGNOSIS — M069 Rheumatoid arthritis, unspecified: Secondary | ICD-10-CM | POA: Diagnosis not present

## 2020-05-07 NOTE — Telephone Encounter (Signed)
Pt called asking about his diabetic shoes and I explained that Dr Loanne Drilling will not sign one of the forms.  He stated he talked to his pcp Dr Dennard Schaumann ad he said he would sign the forms if I would fax them over to Dr Dennard Schaumann.   I told pt I would do this today and if he did not hear back from me in 4 to 5 weeks to call to check status.

## 2020-05-07 NOTE — Progress Notes (Signed)
Subjective:    Patient ID: Aaron Fox, male    DOB: [birth year], 65 y.o.   MRN: [id number]  HPI 04/24/19 Patient establish care with me in August of last year.  This is been a only encounter.  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  His most recent hemoglobin A1c was 11.  He states that he has not seen his endocrinologist in more than a year primarily due to the Covid pandemic.  He admits that up until 3 weeks ago he was not managing his sugars.  He was not checking his sugars although he states that he was being compliant with his insulin.  He is currently taking Novolin in 28 units in the morning.  He takes regular insulin 7 units with supper.  He has been checking his blood sugars 3 times a day for the last 3 weeks.  He states that his sugars are now well controlled.  His fasting blood sugars are between 80 and 120 2 in the morning.  His evening blood sugars are between 120 and 166.  He denies any blood sugars greater than 200.  He denies any hypoglycemic episodes.  I asked the patient what changed and he states that he is now eating better and avoiding soda and bread and cakes and cookies.  However again he states that he was being compliant in the way he was taking his insulin.  At that time, my plan was: I explained to the patient that his hemoglobin A1c would suggest an average blood sugar between 280 and 300 over the last 3 months.  Therefore his insulin regimen is not sufficiently controlling his blood sugars.  I explained to the patient that this would only lead to future complications from his diabetes.  However over the last 3 weeks his blood sugars are essentially perfect.  Therefore I do feel unsafe increasing his insulin since his reported sugars are well within control.  Therefore of asked the patient to check his blood sugars 3 times a day, fasting in the morning,  2 hours after lunch, and 2 hours after supper.  I will review these values in 1 week and further titrate his insulin accordingly to achieve fasting blood sugars between 80 and 130 and 2-hour postprandial sugars between 80 and 160.  However at the present time his reported blood sugars not well controlled and therefore I hesitate to make any changes.  05/05/19 Patient was recently discharged from the hospital after his surgery.  He is doing exceptional job since discharge from his hospital stay.  He is still taking 28 units of Novolin in the morning and 7 units of regular insulin around dinnertime.  His morning blood sugars before breakfast are between 125 and 138.  His lunchtime sugars are 82-142.  Dinnertime sugars are 95-148.  Bedtime sugars are 79-172    Patient does report breakthrough neuropathy in both feet.  He reports burning and stinging pain like his feet are on fire all day long.  He does not feel that the 300 mg of gabapentin twice daily is sufficient.  AT that time, my plan was: I am extremely proud of the patient's blood sugars.  His current regimen of insulin including 28 units of Novolin N in the morning and 7 units of regular insulin in the evening at dinnertime seem to be working well.  His blood sugars are all between 90  and 190 with the vast majority averaging between 130 and 140.  I do not believe that he can do better without running the risk of hypoglycemia.  Therefore I will make no changes in his insulin dose.  I will recheck a hemoglobin A1c in 3 months.  Check CBC given his drop in hemoglobin after surgery from 1210 as well as a BMP today and if labs are stable recheck in 3 months or sooner if worse.  Meanwhile increase gabapentin to 300 mg 3 times a day.  If this is not sufficient in 2 weeks he can increase to 600 mg twice daily.  After 2 weeks if this is not sufficient we can increase to a maximum of 600 mg 3 times a day.  I wrote this out for the patient including a titration  schedule.  He will give me regular updates via telephone to let me know how he is doing.   05/07/20  I have not seen the patient since last year.  However his hemoglobin A1c's were doing well 05/17/2019 typically between 6 and 7.  His most recent A1c in February of this year however was 9.7.  He recently contacted my office asking to switch to a different endocrinologist.  He is here today for follow-up.  Apparently there have been issues getting his diabetic shoes and therefore he is requesting a different endocrinologist.  I performed a diabetic foot exam today.  The patient is missing all the toes distal to the MTP joint on his right foot.  He has a history of previous transmetatarsal amputation is due to recurrent infections in the right foot.  He has diminished sensation to 10 g monofilament in his left foot distal to the MTP joints on all 5 toes.  He has a hammertoe of the third digit with a pressure callus forming on the plantar aspect of the third digit.  He has normal pulses.  He states that he was eating poorly over Christmas and he is corrected his diet.  His fasting blood sugars tend to be between 101 120.  His 2-hour postprandial sugars are all less than 200.  He states that he changed his diet he suspects that his A1c next time may will be excellent.  He is due for PSA.  Last colonoscopy was in 2020 and is up-to-date.  He is due for the shingles vaccine but he is adamant that he is never had chickenpox.  Prior to receiving the shingles vaccine he would like to check to see if he has had chickenpox.  Blood pressure today is acceptable.  He denies any chest pain shortness of breath or dyspnea on exertion  Past Medical History:  Diagnosis Date  . ALLERGIC RHINITIS 07/02/2008  . Allergy   . Arthritis    RA  . Atrial fibrillation ([place])   . Cataract    removed both eyes   . CHF 06/19/2008  . COUGH DUE TO ACE INHIBITORS 10/05/2008  . DIABETES MELLITUS, TYPE II 09/22/2006  . Diabetic retinopathy of  both eyes ([facility])    mild nonproliferative  . ED (erectile dysfunction)   . Glaucoma   . GOUT 09/22/2006  . Hx of adenomatous colonic polyps 06/09/2015  . HYPERCHOLESTEROLEMIA 07/02/2008  . HYPERTENSION 05/31/2009  . HYPOGONADISM, MALE 01/15/2007  . Hypokalemia   . 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 Lymphadenopathy   . Peripheral neuropathy   . PUD (peptic ulcer disease)   . Rheumatoid arthritis(714.0) 09/22/2006   Past Surgical History:  Procedure Laterality Date  . ACHILLES TENDON SURGERY Right 04/25/2019   Procedure: ACHILLES LENGTHENING;  Surgeon: Trula Slade, DPM;  Location: [facility];  Service: Podiatry;  Laterality: Right;  . AMPUTATION Right 04/25/2019   Procedure: AMPUTATION Brynda Peon;  Surgeon: Trula Slade, DPM;  Location: [facility];  Service: Podiatry;  Laterality: Right;  . CATARACT EXTRACTION Bilateral    Dr. Talbert Forest  . CATARACT EXTRACTION, BILATERAL    . COLONOSCOPY  2004  . LYMPH NODE BIOPSY Right 10/16/2014   Procedure: RIGHT INGUINAL LYMPH NODE BIOPSY;  Surgeon: Aviva Signs, MD;  Location: [facility];  Service: General;  Laterality: Right;   Current Outpatient Medications on File Prior to Visit  Medication Sig Dispense Refill  . ACCU-CHEK GUIDE test strip TEST BLOOD SUGAR 7 TIMES DAILY 600 strip 3  . Accu-Chek Softclix Lancets lancets USE TO MONITOR GLUCOSE LEVELS 7 TIMES DAILY 600 each 3  . Alcohol Swabs (B-D SINGLE USE SWABS REGULAR) PADS Use to cleanse site prior to use of lancet to obtain droplet of blood two times per day; E10.49 200 each 2  . aspirin 81 MG tablet Take 81 mg by mouth daily.    Marland Kitchen atorvastatin (LIPITOR) 10 MG tablet Take 1 tablet (10 mg total) by mouth daily. 90 tablet 1  . Blood Glucose Monitoring Suppl (ACCU-CHEK GUIDE ME) w/Device KIT 1 each by Does not apply route See admin instructions. Use to monitor glucose levels 7 times daily; E11.9; WILL NOT COMPLETE PA 1 kit 0  . DROPLET INSULIN SYRINGE 29G X 1/2" 1 ML MISC USE IN THE  MORNING AND AT BEDTIME 200 each 3  . gabapentin (NEURONTIN) 300 MG capsule Take 2 capsules (600 mg total) by mouth 3 (three) times daily. 540 capsule 2  . insulin NPH Human (NOVOLIN N) 100 UNIT/ML injection Inject 0.23 mLs (23 Units total) into the skin every morning. 30 mL 2  . insulin regular (NOVOLIN R RELION) 100 units/mL injection Inject 0.04 mLs (4 Units total) into the skin daily with supper. And syringes 2/day 10 mL 11  . losartan (COZAAR) 50 MG tablet Take 1 tablet (50 mg total) by mouth daily. 90 tablet 1  . Tetrahydrozoline HCl (VISINE OP) Place 1 drop into both eyes daily as needed (redness).    . triamcinolone (KENALOG) 0.1 % APPLY TO THE AFFECTED AREA(S) TOPICALLY THREE TIMES DAILY AS NEEDED FOR RASH 45 g 2   No current facility-administered medications on file prior to visit.   Allergies  Allergen Reactions  . Penicillins Swelling    Swelling of the hands and feet, skin peeling Did it involve swelling of the face/tongue/throat, SOB, or low BP? No Did it involve sudden or severe rash/hives, skin peeling, or any reaction on the inside of your mouth or nose? No Did you need to seek medical attention at a hospital or doctor's office? Yes When did it last happen?10 + years If all above answers are "NO", may proceed with cephalosporin use.   . Ciprofloxacin Hives and Rash  . Terbinafine Hcl Hives  . Zestril [Lisinopril] Cough   Social History   Socioeconomic History  . Marital status: Married    Spouse name: Not on file  . Number of children: 3  . Years of education: Not on file  . Highest education level: Not on file  Occupational History  . Occupation: Disability    Employer: [employer]  Tobacco Use  . Smoking status: Former Smoker    Quit date: 11/02/1999  Years since quitting: 20.5  . Smokeless tobacco: Never Used  Substance and Sexual Activity  . Alcohol use: No    Alcohol/week: 0.0 standard drinks  . Drug use: No  . Sexual activity: Not on file   Other Topics Concern  . Not on file  Social History Narrative   Married, 3 kids   Prior work [employer] before disability   Regular exercise-yes   Social Determinants of Health   Financial Resource Strain: Not on file  Food Insecurity: Not on file  Transportation Needs: Not on file  Physical Activity: Not on file  Stress: Not on file  Social Connections: Not on file  Intimate Partner Violence: Not on file     Review of Systems  All other systems reviewed and are negative.      Objective:   Physical Exam Vitals reviewed.  Constitutional:      General: He is not in acute distress.    Appearance: Normal appearance. He is normal weight. He is not ill-appearing, toxic-appearing or diaphoretic.  Cardiovascular:     Rate and Rhythm: Normal rate and regular rhythm.     Heart sounds: Normal heart sounds. No murmur heard.   Pulmonary:     Effort: Pulmonary effort is normal. No respiratory distress.     Breath sounds: Normal breath sounds. No stridor. No wheezing, rhonchi or rales.  Chest:     Chest wall: No tenderness.  Abdominal:     General: Abdomen is flat. Bowel sounds are normal.     Palpations: Abdomen is soft.           Assessment & Plan:  Uncontrolled type 2 diabetes mellitus with skin complication, with long-term current use of insulin ([place]) - Plan: COMPLETE METABOLIC PANEL WITH GFR, Lipid panel, Microalbumin, urine, CBC with Differential/Platelet  Prostate cancer screening - Plan: PSA  History of chicken pox - Plan: Varicella zoster antibody, IgG  I will refer the patient to endocrinologist per his request.  However it seems that he is corrected his sugars now with dietary changes and I would recheck an A1c in May.  Monitor his kidney function.  Check his cholesterol.  Goal LDL cholesterol would be less than 100.  Recheck PSA with the patient is here as he is overdue for prostate cancer screening.  Colonoscopy is up-to-date.  Check for a history of varicella.  If  positive, the patient was then agreed to get the shingles vaccine.  I will be happy to fill out any forms that they try a foot center has for the patient.  I asked the patient to have them fax it to [place] and we can fill it out for him.

## 2020-05-10 ENCOUNTER — Other Ambulatory Visit: Payer: Self-pay | Admitting: *Deleted

## 2020-05-10 DIAGNOSIS — R7989 Other specified abnormal findings of blood chemistry: Secondary | ICD-10-CM

## 2020-05-10 LAB — CBC WITH DIFFERENTIAL/PLATELET
Absolute Monocytes: 298 cells/uL (ref 200–950)
Basophils Absolute: 81 cells/uL (ref 0–200)
Basophils Relative: 2.3 %
Eosinophils Absolute: 315 cells/uL (ref 15–500)
Eosinophils Relative: 9 %
HCT: 38.5 % (ref 38.5–50.0)
Hemoglobin: 12.5 g/dL — ABNORMAL LOW (ref 13.2–17.1)
Lymphs Abs: 1344 cells/uL (ref 850–3900)
MCH: 27.7 pg (ref 27.0–33.0)
MCHC: 32.5 g/dL (ref 32.0–36.0)
MCV: 85.4 fL (ref 80.0–100.0)
MPV: 10.5 fL (ref 7.5–12.5)
Monocytes Relative: 8.5 %
Neutro Abs: 1463 cells/uL — ABNORMAL LOW (ref 1500–7800)
Neutrophils Relative %: 41.8 %
Platelets: 240 10*3/uL (ref 140–400)
RBC: 4.51 10*6/uL (ref 4.20–5.80)
RDW: 13.9 % (ref 11.0–15.0)
Total Lymphocyte: 38.4 %
WBC: 3.5 10*3/uL — ABNORMAL LOW (ref 3.8–10.8)

## 2020-05-10 LAB — LIPID PANEL
Cholesterol: 127 mg/dL (ref <200)
HDL: 47 mg/dL (ref >=40)
LDL Cholesterol (Calc): 65 mg/dL (calc)
Non-HDL Cholesterol (Calc): 80 mg/dL (calc) (ref <130)
Total CHOL/HDL Ratio: 2.7 (calc) (ref <5.0)
Triglycerides: 71 mg/dL (ref <150)

## 2020-05-10 LAB — COMPLETE METABOLIC PANEL WITH GFR
AG Ratio: 1.3 (calc) (ref 1.0–2.5)
ALT: 53 U/L — ABNORMAL HIGH (ref 9–46)
AST: 84 U/L — ABNORMAL HIGH (ref 10–35)
Albumin: 4.2 g/dL (ref 3.6–5.1)
Alkaline phosphatase (APISO): 75 U/L (ref 35–144)
BUN: 19 mg/dL (ref 7–25)
CO2: 22 mmol/L (ref 20–32)
Calcium: 9.5 mg/dL (ref 8.6–10.3)
Chloride: 109 mmol/L (ref 98–110)
Creat: 1.03 mg/dL (ref 0.70–1.25)
GFR, Est African American: 89 mL/min/{1.73_m2} (ref >=60)
GFR, Est Non African American: 76 mL/min/{1.73_m2} (ref >=60)
Globulin: 3.2 g/dL (calc) (ref 1.9–3.7)
Glucose, Bld: 99 mg/dL (ref 65–99)
Potassium: 4.4 mmol/L (ref 3.5–5.3)
Sodium: 140 mmol/L (ref 135–146)
Total Bilirubin: 0.5 mg/dL (ref 0.2–1.2)
Total Protein: 7.4 g/dL (ref 6.1–8.1)

## 2020-05-10 LAB — MICROALBUMIN, URINE: Microalb, Ur: 7.9 mg/dL

## 2020-05-10 LAB — PSA: PSA: 1.28 ng/mL (ref <=4.0)

## 2020-05-10 LAB — VARICELLA ZOSTER ANTIBODY, IGG: Varicella IgG: 2407 index

## 2020-05-11 ENCOUNTER — Other Ambulatory Visit: Payer: Self-pay | Admitting: Family Medicine

## 2020-05-11 ENCOUNTER — Other Ambulatory Visit: Payer: Self-pay

## 2020-05-11 ENCOUNTER — Other Ambulatory Visit: Payer: Medicare HMO

## 2020-05-11 ENCOUNTER — Ambulatory Visit
Admission: RE | Admit: 2020-05-11 | Discharge: 2020-05-11 | Disposition: A | Discharge status: Home / Self Care | Payer: Medicare HMO | Source: Ambulatory Visit | Attending: Family Medicine | Admitting: Family Medicine

## 2020-05-11 DIAGNOSIS — R7989 Other specified abnormal findings of blood chemistry: Secondary | ICD-10-CM | POA: Diagnosis not present

## 2020-05-11 DIAGNOSIS — R945 Abnormal results of liver function studies: Secondary | ICD-10-CM | POA: Diagnosis not present

## 2020-05-11 DIAGNOSIS — K7290 Hepatic failure, unspecified without coma: Secondary | ICD-10-CM | POA: Diagnosis not present

## 2020-05-12 LAB — HEPATITIS PANEL, ACUTE
Hep A IgM: NONREACTIVE
Hep B C IgM: NONREACTIVE
Hepatitis B Surface Ag: NONREACTIVE
Hepatitis C Ab: NONREACTIVE
SIGNAL TO CUT-OFF: 0.01 (ref <1.00)

## 2020-05-13 ENCOUNTER — Telehealth: Payer: Self-pay | Admitting: Podiatry

## 2020-05-13 NOTE — Telephone Encounter (Signed)
I received a call from the clinical intake nurse from [facility] asking if the L5000 that we are trying to get auth for is a replacement due to changes in pts foot or is just for normal wear and tear.  I explained that it is for normal wear and tear as pt usually gets replaced yearly with his diabetic shoes and inserts.

## 2020-05-14 ENCOUNTER — Ambulatory Visit: Payer: Medicare HMO | Admitting: Nurse Practitioner

## 2020-05-14 ENCOUNTER — Other Ambulatory Visit: Payer: Self-pay

## 2020-05-14 ENCOUNTER — Encounter: Payer: Self-pay | Admitting: Nurse Practitioner

## 2020-05-14 VITALS — BP 144/69 | HR 54 | Ht 71.0 in | Wt 225.0 lb

## 2020-05-14 DIAGNOSIS — Z794 Long term (current) use of insulin: Secondary | ICD-10-CM | POA: Diagnosis not present

## 2020-05-14 DIAGNOSIS — E1165 Type 2 diabetes mellitus with hyperglycemia: Secondary | ICD-10-CM

## 2020-05-14 MED ORDER — INSULIN REGULAR HUMAN 100 UNIT/ML IJ SOLN: 3.0000 [IU] | Freq: Three times a day (TID) | INTRAMUSCULAR | 11 refills | Status: DC

## 2020-05-14 MED ORDER — INSULIN NPH (HUMAN) (ISOPHANE) 100 UNIT/ML ~~LOC~~ SUSP: 20.0000 [IU] | Freq: Every day | SUBCUTANEOUS | 2 refills | Status: DC

## 2020-05-14 NOTE — Progress Notes (Signed)
Endocrinology Consult Note       05/14/2020, 9:02 AM   Subjective:    Patient ID: Aaron Fox, male    DOB: 1955/10/19.  Aaron Fox is being seen in consultation for management of currently uncontrolled symptomatic diabetes requested by  [name], MD.   Past Medical History:  Diagnosis Date  . ALLERGIC RHINITIS 07/02/2008  . Allergy   . Arthritis    RA  . Atrial fibrillation ([place])   . Cataract    removed both eyes   . CHF 06/19/2008  . COUGH DUE TO ACE INHIBITORS 10/05/2008  . DIABETES MELLITUS, TYPE II 09/22/2006  . Diabetic retinopathy of both eyes ([facility])    mild nonproliferative  . ED (erectile dysfunction)   . Glaucoma   . GOUT 09/22/2006  . Hx of adenomatous colonic polyps 06/09/2015  . HYPERCHOLESTEROLEMIA 07/02/2008  . HYPERTENSION 05/31/2009  . HYPOGONADISM, MALE 01/15/2007  . Hypokalemia   . 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 Lymphadenopathy   . Peripheral neuropathy   . PUD (peptic ulcer disease)   . Rheumatoid arthritis(714.0) 09/22/2006    Past Surgical History:  Procedure Laterality Date  . ACHILLES TENDON SURGERY Right 04/25/2019   Procedure: ACHILLES LENGTHENING;  Surgeon: Trula Slade, DPM;  Location: [facility];  Service: Podiatry;  Laterality: Right;  . AMPUTATION Right 04/25/2019   Procedure: AMPUTATION Brynda Peon;  Surgeon: Trula Slade, DPM;  Location: [facility];  Service: Podiatry;  Laterality: Right;  . CATARACT EXTRACTION Bilateral    Dr. Talbert Forest  . CATARACT EXTRACTION, BILATERAL    . COLONOSCOPY  2004  . LYMPH NODE BIOPSY Right 10/16/2014   Procedure: RIGHT INGUINAL LYMPH NODE BIOPSY;  Surgeon: Aviva Signs, MD;  Location: [facility];  Service: General;  Laterality: Right;    Social History   Socioeconomic History  . Marital status: Married    Spouse name: Not on file  . Number of children: 3  . Years of education: Not on file  . Highest  education level: Not on file  Occupational History  . Occupation: Disability    Employer: [employer]  Tobacco Use  . Smoking status: Former Smoker    Quit date: 11/02/1999    Years since quitting: 20.5  . Smokeless tobacco: Never Used  Substance and Sexual Activity  . Alcohol use: No    Alcohol/week: 0.0 standard drinks  . Drug use: No  . Sexual activity: Not on file  Other Topics Concern  . Not on file  Social History Narrative   Married, 3 kids   Prior work [employer] before disability   Regular exercise-yes   Social Determinants of Health   Financial Resource Strain: Not on file  Food Insecurity: Not on file  Transportation Needs: Not on file  Physical Activity: Not on file  Stress: Not on file  Social Connections: Not on file    Family History  Problem Relation Age of Onset  . Heart disease Other        FH of CAD  . Colon polyps Paternal Uncle   . Kidney disease Paternal Uncle   . Diabetes Paternal Grandfather   . Diabetes Maternal Uncle   .  Cancer Neg Hx   . Colon cancer Neg Hx   . Gallbladder disease Neg Hx   . Esophageal cancer Neg Hx   . Rectal cancer Neg Hx   . Stomach cancer Neg Hx     Outpatient Encounter Medications as of 05/14/2020  Medication Sig  . ACCU-CHEK GUIDE test strip TEST BLOOD SUGAR 7 TIMES DAILY  . Accu-Chek Softclix Lancets lancets USE TO MONITOR GLUCOSE LEVELS 7 TIMES DAILY  . Alcohol Swabs (B-D SINGLE USE SWABS REGULAR) PADS Use to cleanse site prior to use of lancet to obtain droplet of blood two times per day; E10.49  . aspirin 81 MG tablet Take 81 mg by mouth daily.  Marland Kitchen atorvastatin (LIPITOR) 10 MG tablet Take 1 tablet (10 mg total) by mouth daily.  . Blood Glucose Monitoring Suppl (ACCU-CHEK GUIDE ME) w/Device KIT 1 each by Does not apply route See admin instructions. Use to monitor glucose levels 7 times daily; E11.9; WILL NOT COMPLETE PA  . DROPLET INSULIN SYRINGE 29G X 1/2" 1 ML MISC USE IN THE MORNING AND AT BEDTIME  . gabapentin  (NEURONTIN) 300 MG capsule Take 2 capsules (600 mg total) by mouth 3 (three) times daily.  Marland Kitchen losartan (COZAAR) 50 MG tablet TAKE 1 TABLET EVERY DAY  . Tetrahydrozoline HCl (VISINE OP) Place 1 drop into both eyes daily as needed (redness).  . triamcinolone (KENALOG) 0.1 % APPLY TO THE AFFECTED AREA(S) TOPICALLY THREE TIMES DAILY AS NEEDED FOR RASH  . [DISCONTINUED] insulin NPH Human (NOVOLIN N) 100 UNIT/ML injection Inject 0.23 mLs (23 Units total) into the skin every morning.  . [DISCONTINUED] insulin regular (NOVOLIN R RELION) 100 units/mL injection Inject 0.04 mLs (4 Units total) into the skin daily with supper. And syringes 2/day  . insulin NPH Human (NOVOLIN N) 100 UNIT/ML injection Inject 0.2 mLs (20 Units total) into the skin at bedtime.  . insulin regular (NOVOLIN R RELION) 100 units/mL injection Inject 0.03-0.06 mLs (3-6 Units total) into the skin 3 (three) times daily before meals. And syringes 2/day   No facility-administered encounter medications on file as of 05/14/2020.    ALLERGIES: Allergies  Allergen Reactions  . Penicillins Swelling    Swelling of the hands and feet, skin peeling Did it involve swelling of the face/tongue/throat, SOB, or low BP? No Did it involve sudden or severe rash/hives, skin peeling, or any reaction on the inside of your mouth or nose? No Did you need to seek medical attention at a hospital or doctor's office? Yes When did it last happen?10 + years If all above answers are "NO", may proceed with cephalosporin use.   . Ciprofloxacin Hives and Rash  . Terbinafine Hcl Hives  . Zestril [Lisinopril] Cough    VACCINATION STATUS: Immunization History  Administered Date(s) Administered  . Influenza Whole 12/28/2008  . Influenza,inj,Quad PF,6+ Mos 11/21/2013, 04/01/2015, 12/20/2015, 01/08/2017, 12/17/2017, 10/25/2018, 12/19/2019  . Moderna Sars-Covid-2 Vaccination 04/29/2019, 05/27/2019, 12/29/2019  . Pneumococcal Polysaccharide-23 06/11/2008  .  Td 09/29/2005  . Tdap 10/15/2017    Diabetes He presents for his initial diabetic visit. He has type 2 diabetes mellitus. Onset time: was diagnosed at approx age of 77. His disease course has been worsening. Hypoglycemia symptoms include nervousness/anxiousness, sweats and tremors. Associated symptoms include blurred vision, foot paresthesias and weight loss. Pertinent negatives for diabetes include no chest pain, no fatigue, no polydipsia, no polyphagia and no polyuria. There are no hypoglycemic complications. Symptoms are stable. Diabetic complications include heart disease, impotence, nephropathy, peripheral neuropathy and  retinopathy. 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 Current diabetic treatment includes intensive insulin program. He is compliant with treatment most of the time. His weight is decreasing steadily (States he had fallen off his diet but is now back on it and dropped some weight). He is following a generally healthy diet. When asked about meal planning, he reported none. He has not had a previous visit with a dietitian. He participates in exercise three times a week. (He presents today for his consultation with no meter or logs to review.  He reports he does not follow a consistent routine for monitoring his glucose but he does monitor it at least once daily.  His most recent A1c was 9.7% on 2/11, worsening from previous A1c of 6.7%.  He endorses eating 3 routine meals and snacks only if treating hypoglycemia.  He drinks mostly water, but will flavor it with crystal light packets at times.  He states he is very active.  He reports frequent episodes of hypoglycemia.) An ACE inhibitor/angiotensin II receptor blocker is being taken. He sees a podiatrist.Eye exam is current.  Hyperlipidemia This is a chronic problem. The current episode started more than 1  year ago. The problem is controlled. Recent lipid tests were reviewed and are normal. Exacerbating diseases include chronic renal disease and diabetes. There are no known factors aggravating his hyperlipidemia. Pertinent negatives include no chest pain. Current antihyperlipidemic treatment includes statins. The current treatment provides moderate improvement of lipids. There are no compliance problems.  Risk factors for coronary artery disease include diabetes mellitus, dyslipidemia, hypertension and male sex.  Hypertension This is a chronic problem. The current episode started more than 1 year ago. The problem has been gradually improving since onset. The problem is uncontrolled (had taken his medications not long prior to visit). Associated symptoms include blurred vision and sweats. Pertinent negatives include no chest pain. There are no associated agents to hypertension. Risk factors for coronary artery disease include diabetes mellitus, dyslipidemia and male gender. Past treatments include angiotensin blockers. The current treatment provides moderate improvement. There are no compliance problems.  Hypertensive end-organ damage includes kidney disease, heart failure and retinopathy. Identifiable causes of hypertension include chronic renal disease.     Review of systems  Constitutional: + Minimally fluctuating body weight,  current Body mass index is 31.38 kg/m. , no fatigue, no subjective hyperthermia, no subjective hypothermia Eyes: + blurry vision, no xerophthalmia ENT: no sore throat, no nodules palpated in throat, no dysphagia/odynophagia, no hoarseness Cardiovascular: no chest pain, no shortness of breath, no palpitations, no leg swelling Respiratory: no cough, no shortness of breath Gastrointestinal: no nausea/vomiting/diarrhea Musculoskeletal: no muscle/joint aches Skin: no rashes, no hyperemia Neurological: no tremors, no numbness, no tingling, no dizziness Psychiatric: no depression,  no anxiety  Objective:     BP (!) 144/69 (BP Location: Left Arm, Patient Position: Sitting)   Pulse (!) 54   Ht 5' 11" (1.803 m)   Wt 225 lb (102.1 kg)   BMI 31.38 kg/m   Wt Readings from Last 3 Encounters:  05/14/20 225 lb (102.1 kg)  05/07/20 221 lb (100.2 kg)  04/09/20 234 lb 3.2 oz (106.2 kg)     BP Readings from Last 3 Encounters:  05/14/20 (!) 144/69  05/07/20 128/74  04/09/20 120/78     Physical Exam- Limited  Constitutional:  Body mass index is 31.38 kg/m. , not in acute distress,  normal state of mind Eyes:  EOMI, no exophthalmos Neck: Supple Cardiovascular: RRR, no murmers, rubs, or gallops, no edema Respiratory: Adequate breathing efforts, no crackles, rales, rhonchi, or wheezing Musculoskeletal: no gross deformities, strength intact in all four extremities, no gross restriction of joint movements Skin:  no rashes, no hyperemia Neurological: no tremor with outstretched hands    CMP ( most recent) CMP     Component Value Date/Time   NA 140 05/07/2020 0816   K 4.4 05/07/2020 0816   CL 109 05/07/2020 0816   CO2 22 05/07/2020 0816   GLUCOSE 99 05/07/2020 0816   BUN 19 05/07/2020 0816   CREATININE 1.03 05/07/2020 0816   CALCIUM 9.5 05/07/2020 0816   PROT 7.4 05/07/2020 0816   ALBUMIN 3.8 10/15/2017 0818   AST 84 (H) 05/07/2020 0816   ALT 53 (H) 05/07/2020 0816   ALKPHOS 94 10/15/2017 0818   BILITOT 0.5 05/07/2020 0816   GFRNONAA 76 05/07/2020 0816   GFRAA 89 05/07/2020 0816     Diabetic Labs (most recent): Lab Results  Component Value Date   HGBA1C 9.7 (A) 04/09/2020   HGBA1C 6.7 (A) 12/19/2019   HGBA1C 5.8 (A) 09/12/2019     Lipid Panel ( most recent) Lipid Panel     Component Value Date/Time   CHOL 127 05/07/2020 0816   TRIG 71 05/07/2020 0816   HDL 47 05/07/2020 0816   CHOLHDL 2.7 05/07/2020 0816   VLDL 33.0 10/15/2017 0818   LDLCALC 65 05/07/2020 0816   LDLDIRECT 120.2 04/04/2013 0918      Lab Results  Component Value Date    TSH 1.10 10/15/2017   TSH 1.63 04/28/2016   TSH 0.99 05/21/2014   TSH 1.43 04/04/2013   TSH 1.28 11/13/2011   TSH 0.77 03/29/2010   TSH 0.95 09/01/2009   TSH 0.71 02/08/2009   TSH 0.75 02/07/2008           Assessment & Plan:   1) Uncontrolled Type 2 Diabetes with hyperglycemia  He presents today for his consultation with no meter or logs to review.  He reports he does not follow a consistent routine for monitoring his glucose but he does monitor it at least once daily.  His most recent A1c was 9.7% on 2/11, worsening from previous A1c of 6.7%.  He endorses eating 3 routine meals and snacks only if treating hypoglycemia.  He drinks mostly water, but will flavor it with crystal light packets at times.  He states he is very active.  He reports frequent episodes of hypoglycemia.  - Aaron Fox has currently uncontrolled symptomatic type 2 DM since 65 years of age, with most recent A1c of 9.7 %.   -Recent labs reviewed.  - I had a long discussion with him about the progressive nature of diabetes and the pathology behind its complications. -his diabetes is complicated by retinopathy, CKD, neuropathy, recent TMA and he remains at a high risk for more acute and chronic complications which include CAD, CVA, CKD, retinopathy, and neuropathy. These are all discussed in detail with him.  - I have counseled him on diet  and weight management by adopting a carbohydrate restricted/protein rich diet. Patient is encouraged to switch to unprocessed or minimally processed complex starch and increased protein intake (animal or plant source), fruits, and vegetables. -  he is advised to stick to a routine mealtimes to eat 3 meals a day and avoid unnecessary snacks (to snack only to correct hypoglycemia).   - he acknowledges that  there is a room for improvement in his food and drink choices. - Suggestion is made for him to avoid simple carbohydrates  from his diet including Cakes, Sweet Desserts,  Ice Cream, Soda (diet and regular), Sweet Tea, Candies, Chips, Cookies, Store Bought Juices, Alcohol in Excess of  1-2 drinks a day, Artificial Sweeteners, Coffee Creamer, and "Sugar-free" Products. This will help patient to have more stable blood glucose profile and potentially avoid unintended weight gain.  - I have approached him with the following individualized plan to manage  his diabetes and patient agrees:   -He is advised to change how he takes his insulin to the following regimen: Novolin N 20 units SQ nightly (decreased due to frequent hypoglycemia) Novolin R 3-6 units TID with meals if glucose is above 90 and he is eating.  Specific instructions on how to titrate insulin dose based on glucose readings given to patient in writing.  -he is encouraged to start monitoring glucose 4 times daily, before meals and before bed, to log their readings on the clinic sheets provided, and bring them to review at follow up appointment in 2 weeks.  Sample meter provided from office today.  Will consider him for CGM at next visit.  - he is warned not to take insulin without proper monitoring per orders. - Adjustment parameters are given to him for hypo and hyperglycemia in writing. - he is encouraged to call clinic for blood glucose levels less than 70 or above 300 mg /dl.  - Specific targets for  A1c;  LDL, HDL,  and Triglycerides were discussed with the patient.  2) Blood Pressure /Hypertension:  his blood pressure is controlled to target.   he is advised to continue his current medications including Cozaar 50 mg p.o. daily with breakfast.  3) Lipids/Hyperlipidemia:    Review of his recent lipid panel from 05/07/20 showed controlled  LDL at 65 .  he  is advised to continue Lipitor 10 mg daily at bedtime.  Side effects and precautions discussed with him.  4)  Weight/Diet:  his Body mass index is 31.38 kg/m.  -   clearly complicating his diabetes care.   he is a candidate for weight loss. I  discussed with him the fact that loss of 5 - 10% of his  current body weight will have the most impact on his diabetes management.  Exercise, and detailed carbohydrates information provided  -  detailed on discharge instructions.  5) Chronic Care/Health Maintenance: -he is on ACEI/ARB and Statin medications and is encouraged to initiate and continue to follow up with Ophthalmology, Dentist, Podiatrist at least yearly or according to recommendations, and advised to stay away from smoking. I have recommended yearly flu vaccine and pneumonia vaccine at least every 5 years; moderate intensity exercise for up to 150 minutes weekly; and sleep for at least 7 hours a day.  - he is advised to maintain close follow up with [name], MD for primary care needs, as well as his other providers for optimal and coordinated care.   - Time spent in this patient care: 60 min, of which > 50% was spent in counseling him about his diabetes and the rest reviewing his blood glucose logs, discussing his hypoglycemia and hyperglycemia episodes, reviewing his current and previous labs/studies (including abstraction from other facilities) and medications doses and developing a long term treatment plan based on the latest standards of care/guidelines; and documenting his care.    Please refer to Patient Instructions for  Blood Glucose Monitoring and Insulin/Medications Dosing Guide" in media tab for additional information. Please also refer to "Patient Self Inventory" in the Media tab for reviewed elements of pertinent patient history.  [place] participated in the discussions, expressed understanding, and voiced agreement with the above plans.  All questions were answered to his satisfaction. he is encouraged to contact clinic should he have any questions or concerns prior to his return visit.   Follow up plan: - Return in about 2 weeks (around 05/28/2020) for Diabetes follow up, No previsit labs, Bring  glucometer and logs, ABI next visit.  [name], [facility] [address] [place], [place] [address] Phone: [phone] Fax: [phone]  05/14/2020, 9:02 AM

## 2020-05-14 NOTE — Patient Instructions (Signed)
Diabetes Mellitus and Nutrition, Adult When you have diabetes, or diabetes mellitus, it is very important to have healthy eating habits because your blood sugar (glucose) levels are greatly affected by what you eat and drink. Eating healthy foods in the right amounts, at about the same times every day, can help you:  Control your blood glucose.  Lower your risk of heart disease.  Improve your blood pressure.  Reach or maintain a healthy weight. What can affect my meal plan? Every person with diabetes is different, and each person has different needs for a meal plan. Your health care provider may recommend that you work with a dietitian to make a meal plan that is best for you. Your meal plan may vary depending on factors such as:  The calories you need.  The medicines you take.  Your weight.  Your blood glucose, blood pressure, and cholesterol levels.  Your activity level.  Other health conditions you have, such as heart or kidney disease. How do carbohydrates affect me? Carbohydrates, also called carbs, affect your blood glucose level more than any other type of food. Eating carbs naturally raises the amount of glucose in your blood. Carb counting is a method for keeping track of how many carbs you eat. Counting carbs is important to keep your blood glucose at a healthy level, especially if you use insulin or take certain oral diabetes medicines. It is important to know how many carbs you can safely have in each meal. This is different for every person. Your dietitian can help you calculate how many carbs you should have at each meal and for each snack. How does alcohol affect me? Alcohol can cause a sudden decrease in blood glucose (hypoglycemia), especially if you use insulin or take certain oral diabetes medicines. Hypoglycemia can be a life-threatening condition. Symptoms of hypoglycemia, such as sleepiness, dizziness, and confusion, are similar to symptoms of having too much  alcohol.  Do not drink alcohol if: ? Your health care provider tells you not to drink. ? You are pregnant, may be pregnant, or are planning to become pregnant.  If you drink alcohol: ? Do not drink on an empty stomach. ? Limit how much you use to:  0-1 drink a day for women.  0-2 drinks a day for men. ? Be aware of how much alcohol is in your drink. In the [place] one drink equals one 12 oz bottle of beer (355 mL), one 5 oz glass of wine (148 mL), or one 1 oz glass of hard liquor (44 mL). ? Keep yourself hydrated with water, diet soda, or unsweetened iced tea.  Keep in mind that regular soda, juice, and other mixers may contain a lot of sugar and must be counted as carbs. What are tips for following this plan? Reading food labels  Start by checking the serving size on the "Nutrition Facts" label of packaged foods and drinks. The amount of calories, carbs, fats, and other nutrients listed on the label is based on one serving of the item. Many items contain more than one serving per package.  Check the total grams (g) of carbs in one serving. You can calculate the number of servings of carbs in one serving by dividing the total carbs by 15. For example, if a food has 30 g of total carbs per serving, it would be equal to 2 servings of carbs.  Check the number of grams (g) of saturated fats and trans fats in one serving. Choose foods that have   a low amount or none of these fats.  Check the number of milligrams (mg) of salt (sodium) in one serving. Most people should limit total sodium intake to less than 2,300 mg per day.  Always check the nutrition information of foods labeled as "low-fat" or "nonfat." These foods may be higher in added sugar or refined carbs and should be avoided.  Talk to your dietitian to identify your daily goals for nutrients listed on the label. Shopping  Avoid buying canned, pre-made, or processed foods. These foods tend to be high in fat, sodium, and added  sugar.  Shop around the outside edge of the grocery store. This is where you will most often find fresh fruits and vegetables, bulk grains, fresh meats, and fresh dairy. Cooking  Use low-heat cooking methods, such as baking, instead of high-heat cooking methods like deep frying.  Cook using healthy oils, such as olive, canola, or sunflower oil.  Avoid cooking with butter, cream, or high-fat meats. Meal planning  Eat meals and snacks regularly, preferably at the same times every day. Avoid going long periods of time without eating.  Eat foods that are high in fiber, such as fresh fruits, vegetables, beans, and whole grains. Talk with your dietitian about how many servings of carbs you can eat at each meal.  Eat 4-6 oz (112-168 g) of lean protein each day, such as lean meat, chicken, fish, eggs, or tofu. One ounce (oz) of lean protein is equal to: ? 1 oz (28 g) of meat, chicken, or fish. ? 1 egg. ?  cup (62 g) of tofu.  Eat some foods each day that contain healthy fats, such as avocado, nuts, seeds, and fish.   What foods should I eat? Fruits Berries. Apples. Oranges. Peaches. Apricots. Plums. Grapes. Mango. Papaya. Pomegranate. Kiwi. Cherries. Vegetables Lettuce. Spinach. Leafy greens, including kale, chard, collard greens, and mustard greens. Beets. Cauliflower. Cabbage. Broccoli. Carrots. Green beans. Tomatoes. Peppers. Onions. Cucumbers. Brussels sprouts. Grains Whole grains, such as whole-wheat or whole-grain bread, crackers, tortillas, cereal, and pasta. Unsweetened oatmeal. Quinoa. Brown or wild rice. Meats and other proteins Seafood. Poultry without skin. Lean cuts of poultry and beef. Tofu. Nuts. Seeds. Dairy Low-fat or fat-free dairy products such as milk, yogurt, and cheese. The items listed above may not be a complete list of foods and beverages you can eat. Contact a dietitian for more information. What foods should I avoid? Fruits Fruits canned with  syrup. Vegetables Canned vegetables. Frozen vegetables with butter or cream sauce. Grains Refined white flour and flour products such as bread, pasta, snack foods, and cereals. Avoid all processed foods. Meats and other proteins Fatty cuts of meat. Poultry with skin. Breaded or fried meats. Processed meat. Avoid saturated fats. Dairy Full-fat yogurt, cheese, or milk. Beverages Sweetened drinks, such as soda or iced tea. The items listed above may not be a complete list of foods and beverages you should avoid. Contact a dietitian for more information. Questions to ask a health care provider  Do I need to meet with a diabetes educator?  Do I need to meet with a dietitian?  What number can I call if I have questions?  When are the best times to check my blood glucose? Where to find more information:  American Diabetes Association: diabetes.org  Academy of Nutrition and Dietetics: www.eatright.org  National Institute of Diabetes and Digestive and Kidney Diseases: www.niddk.nih.gov  Association of Diabetes Care and Education Specialists: www.diabeteseducator.org Summary  It is important to have healthy eating   habits because your blood sugar (glucose) levels are greatly affected by what you eat and drink.  A healthy meal plan will help you control your blood glucose and maintain a healthy lifestyle.  Your health care provider may recommend that you work with a dietitian to make a meal plan that is best for you.  Keep in mind that carbohydrates (carbs) and alcohol have immediate effects on your blood glucose levels. It is important to count carbs and to use alcohol carefully. This information is not intended to replace advice given to you by your health care provider. Make sure you discuss any questions you have with your health care provider. Document Revised: 01/21/2019 Document Reviewed: 01/21/2019 [employer] Patient Education  2021 [employer]  

## 2020-05-28 ENCOUNTER — Ambulatory Visit: Payer: Medicare HMO | Admitting: Nurse Practitioner

## 2020-05-28 ENCOUNTER — Encounter: Payer: Self-pay | Admitting: Nurse Practitioner

## 2020-05-28 ENCOUNTER — Other Ambulatory Visit: Payer: Self-pay

## 2020-05-28 VITALS — BP 137/78 | HR 57 | Ht 71.0 in | Wt 219.0 lb

## 2020-05-28 DIAGNOSIS — E1165 Type 2 diabetes mellitus with hyperglycemia: Secondary | ICD-10-CM | POA: Diagnosis not present

## 2020-05-28 DIAGNOSIS — Z794 Long term (current) use of insulin: Secondary | ICD-10-CM | POA: Diagnosis not present

## 2020-05-28 NOTE — Progress Notes (Signed)
Endocrinology Follow Up Note       05/28/2020, 8:46 AM   Subjective:    Patient ID: Aaron Fox, male    DOB: [birth year].  Aaron Fox is being seen in follow up after being seen in consultation for management of currently uncontrolled symptomatic diabetes requested by  [name], MD.   Past Medical History:  Diagnosis Date  . ALLERGIC RHINITIS 07/02/2008  . Allergy   . Arthritis    RA  . Atrial fibrillation ([place])   . Cataract    removed both eyes   . CHF 06/19/2008  . COUGH DUE TO ACE INHIBITORS 10/05/2008  . DIABETES MELLITUS, TYPE II 09/22/2006  . Diabetic retinopathy of both eyes ([facility])    mild nonproliferative  . ED (erectile dysfunction)   . Glaucoma   . GOUT 09/22/2006  . Hx of adenomatous colonic polyps 06/09/2015  . HYPERCHOLESTEROLEMIA 07/02/2008  . HYPERTENSION 05/31/2009  . HYPOGONADISM, MALE 01/15/2007  . Hypokalemia   . 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 Lymphadenopathy   . Peripheral neuropathy   . PUD (peptic ulcer disease)   . Rheumatoid arthritis(714.0) 09/22/2006    Past Surgical History:  Procedure Laterality Date  . ACHILLES TENDON SURGERY Right 04/25/2019   Procedure: ACHILLES LENGTHENING;  Surgeon: Trula Slade, DPM;  Location: [facility];  Service: Podiatry;  Laterality: Right;  . AMPUTATION Right 04/25/2019   Procedure: AMPUTATION Brynda Peon;  Surgeon: Trula Slade, DPM;  Location: [facility];  Service: Podiatry;  Laterality: Right;  . CATARACT EXTRACTION Bilateral    Dr. Talbert Forest  . CATARACT EXTRACTION, BILATERAL    . COLONOSCOPY  2004  . LYMPH NODE BIOPSY Right 10/16/2014   Procedure: RIGHT INGUINAL LYMPH NODE BIOPSY;  Surgeon: Aviva Signs, MD;  Location: [facility];  Service: General;  Laterality: Right;    Social History   Socioeconomic History  . Marital status: Married    Spouse name: Not on file  . Number of children: 3  . Years of  education: Not on file  . Highest education level: Not on file  Occupational History  . Occupation: Disability    Employer: [employer]  Tobacco Use  . Smoking status: Former Smoker    Quit date: 11/02/1999    Years since quitting: 20.5  . Smokeless tobacco: Never Used  Substance and Sexual Activity  . Alcohol use: No    Alcohol/week: 0.0 standard drinks  . Drug use: No  . Sexual activity: Not on file  Other Topics Concern  . Not on file  Social History Narrative   Married, 3 kids   Prior work [employer] before disability   Regular exercise-yes   Social Determinants of Health   Financial Resource Strain: Not on file  Food Insecurity: Not on file  Transportation Needs: Not on file  Physical Activity: Not on file  Stress: Not on file  Social Connections: Not on file    Family History  Problem Relation Age of Onset  . Heart disease Other        FH of CAD  . Colon polyps Paternal Uncle   . Kidney disease Paternal Uncle   . Diabetes Paternal Grandfather   .  Diabetes Maternal Uncle   . Cancer Neg Hx   . Colon cancer Neg Hx   . Gallbladder disease Neg Hx   . Esophageal cancer Neg Hx   . Rectal cancer Neg Hx   . Stomach cancer Neg Hx     Outpatient Encounter Medications as of 05/28/2020  Medication Sig  . ACCU-CHEK GUIDE test strip TEST BLOOD SUGAR 7 TIMES DAILY  . Accu-Chek Softclix Lancets lancets USE TO MONITOR GLUCOSE LEVELS 7 TIMES DAILY  . Alcohol Swabs (B-D SINGLE USE SWABS REGULAR) PADS Use to cleanse site prior to use of lancet to obtain droplet of blood two times per day; E10.49  . aspirin 81 MG tablet Take 81 mg by mouth daily.  Marland Kitchen atorvastatin (LIPITOR) 10 MG tablet Take 1 tablet (10 mg total) by mouth daily.  . Blood Glucose Monitoring Suppl (ACCU-CHEK GUIDE ME) w/Device KIT 1 each by Does not apply route See admin instructions. Use to monitor glucose levels 7 times daily; E11.9; WILL NOT COMPLETE PA  . DROPLET INSULIN SYRINGE 29G X 1/2" 1 ML MISC USE IN THE  MORNING AND AT BEDTIME  . gabapentin (NEURONTIN) 300 MG capsule Take 2 capsules (600 mg total) by mouth 3 (three) times daily.  . insulin NPH Human (NOVOLIN N) 100 UNIT/ML injection Inject 0.2 mLs (20 Units total) into the skin at bedtime.  Marland Kitchen losartan (COZAAR) 50 MG tablet TAKE 1 TABLET EVERY DAY  . Tetrahydrozoline HCl (VISINE OP) Place 1 drop into both eyes daily as needed (redness).  . triamcinolone (KENALOG) 0.1 % APPLY TO THE AFFECTED AREA(S) TOPICALLY THREE TIMES DAILY AS NEEDED FOR RASH  . [DISCONTINUED] insulin regular (NOVOLIN R RELION) 100 units/mL injection Inject 0.03-0.06 mLs (3-6 Units total) into the skin 3 (three) times daily before meals. And syringes 2/day   No facility-administered encounter medications on file as of 05/28/2020.    ALLERGIES: Allergies  Allergen Reactions  . Penicillins Swelling    Swelling of the hands and feet, skin peeling Did it involve swelling of the face/tongue/throat, SOB, or low BP? No Did it involve sudden or severe rash/hives, skin peeling, or any reaction on the inside of your mouth or nose? No Did you need to seek medical attention at a hospital or doctor's office? Yes When did it last happen?10 + years If all above answers are "NO", may proceed with cephalosporin use.   . Ciprofloxacin Hives and Rash  . Terbinafine Hcl Hives  . Zestril [Lisinopril] Cough    VACCINATION STATUS: Immunization History  Administered Date(s) Administered  . Influenza Whole 12/28/2008  . Influenza,inj,Quad PF,6+ Mos 11/21/2013, 04/01/2015, 12/20/2015, 01/08/2017, 12/17/2017, 10/25/2018, 12/19/2019  . Moderna Sars-Covid-2 Vaccination 04/29/2019, 05/27/2019, 12/29/2019  . Pneumococcal Polysaccharide-23 06/11/2008  . Td 09/29/2005  . Tdap 10/15/2017    Diabetes He presents for his follow-up diabetic visit. He has type 2 diabetes mellitus. Onset time: was diagnosed at approx age of 27. His disease course has been improving. Hypoglycemia symptoms  include nervousness/anxiousness, sweats and tremors. Associated symptoms include blurred vision, foot paresthesias and weight loss. Pertinent negatives for diabetes include no chest pain, no fatigue, no polydipsia, no polyphagia and no polyuria. There are no hypoglycemic complications. Symptoms are stable. Diabetic complications include heart disease, impotence, nephropathy, peripheral neuropathy and retinopathy. 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 Current diabetic treatment includes intensive insulin program. He is compliant with treatment most of the time.  His weight is decreasing steadily. He is following a generally healthy diet. When asked about meal planning, he reported none. He has not had a previous visit with a dietitian. He participates in exercise three times a week. His home blood glucose trend is decreasing steadily. His breakfast blood glucose range is generally 140-180 mg/dl. His lunch blood glucose range is generally 70-90 mg/dl. His dinner blood glucose range is generally 90-110 mg/dl. His bedtime blood glucose range is generally 130-140 mg/dl. (He presents today with his meter and logs showing improved glycemic pattern overall.  He continues to have some episodes of hypoglycemia around lunchtime, but is less than before.  He has lost 6 lbs since last visit.  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 He sees a podiatrist.Eye exam is current.  Hyperlipidemia This is a chronic problem. The current episode started more than 1 year ago. The problem is controlled. Recent lipid tests were reviewed and are normal. Exacerbating diseases include chronic renal disease and diabetes. There are no known factors aggravating his hyperlipidemia. Pertinent negatives include no chest  pain. Current antihyperlipidemic treatment includes statins. The current treatment provides moderate improvement of lipids. There are no compliance problems.  Risk factors for coronary artery disease include diabetes mellitus, dyslipidemia, hypertension and male sex.  Hypertension This is a chronic problem. The current episode started more than 1 year ago. The problem has been gradually improving since onset. The problem is uncontrolled (had taken his medications not long prior to visit). Associated symptoms include blurred vision and sweats. Pertinent negatives include no chest pain. There are no associated agents to hypertension. Risk factors for coronary artery disease include diabetes mellitus, dyslipidemia and male gender. Past treatments include angiotensin blockers. The current treatment provides moderate improvement. There are no compliance problems.  Hypertensive end-organ damage includes kidney disease, heart failure and retinopathy. Identifiable causes of hypertension include chronic renal disease.     Review of systems  Constitutional: + Minimally fluctuating body weight,  current Body mass index is 30.54 kg/m. , no fatigue, no subjective hyperthermia, no subjective hypothermia Eyes: + blurry vision, no xerophthalmia ENT: no sore throat, no nodules palpated in throat, no dysphagia/odynophagia, no hoarseness Cardiovascular: no chest pain, no shortness of breath, no palpitations, no leg swelling Respiratory: no cough, no shortness of breath Gastrointestinal: no nausea/vomiting/diarrhea Musculoskeletal: no muscle/joint aches Skin: no rashes, no hyperemia Neurological: no tremors, no numbness, no tingling, no dizziness Psychiatric: no depression, no anxiety  Objective:     BP 137/78   Pulse (!) 57   Ht _0  (1.803 m)   Wt 219 lb (99.3 kg)   BMI 30.54 kg/m   Wt Readings from Last 3 Encounters:  05/28/20 219 lb (99.3 kg)  05/14/20 225 lb (102.1 kg)  05/07/20 221 lb (100.2 kg)      BP Readings from Last 3 Encounters:  05/28/20 137/78  05/14/20 (!) 144/69  05/07/20 128/74      Physical Exam- Limited  Constitutional:  Body mass index is 30.54 kg/m. , not in acute distress, normal state of mind Eyes:  EOMI, no exophthalmos Neck: Supple Cardiovascular: RRR, no murmers, rubs, or gallops, no edema Respiratory: Adequate breathing efforts, no crackles, rales, rhonchi, or wheezing Musculoskeletal: no gross deformities, strength intact in all four extremities, no gross restriction of joint movements Skin:  no rashes, no hyperemia Neurological: no tremor with outstretched hands    CMP ( most recent)  CMP     Component Value Date/Time   NA 140 05/07/2020 0816   K 4.4 05/07/2020 0816   CL 109 05/07/2020 0816   CO2 22 05/07/2020 0816   GLUCOSE 99 05/07/2020 0816   BUN 19 05/07/2020 0816   CREATININE 1.03 05/07/2020 0816   CALCIUM 9.5 05/07/2020 0816   PROT 7.4 05/07/2020 0816   ALBUMIN 3.8 10/15/2017 0818   AST 84 (H) 05/07/2020 0816   ALT 53 (H) 05/07/2020 0816   ALKPHOS 94 10/15/2017 0818   BILITOT 0.5 05/07/2020 0816   GFRNONAA 76 05/07/2020 0816   GFRAA 89 05/07/2020 0816     Diabetic Labs (most recent): Lab Results  Component Value Date   HGBA1C 9.7 (A) 04/09/2020   HGBA1C 6.7 (A) 12/19/2019   HGBA1C 5.8 (A) 09/12/2019     Lipid Panel ( most recent) Lipid Panel     Component Value Date/Time   CHOL 127 05/07/2020 0816   TRIG 71 05/07/2020 0816   HDL 47 05/07/2020 0816   CHOLHDL 2.7 05/07/2020 0816   VLDL 33.0 10/15/2017 0818   LDLCALC 65 05/07/2020 0816   LDLDIRECT 120.2 04/04/2013 0918      Lab Results  Component Value Date   TSH 1.10 10/15/2017   TSH 1.63 04/28/2016   TSH 0.99 05/21/2014   TSH 1.43 04/04/2013   TSH 1.28 11/13/2011   TSH 0.77 03/29/2010   TSH 0.95 09/01/2009   TSH 0.71 02/08/2009   TSH 0.75 02/07/2008           Assessment & Plan:   1) Uncontrolled Type 2 Diabetes with hyperglycemia  He presents  today with his meter and logs showing improved glycemic pattern overall.  He continues to have some episodes of hypoglycemia around lunchtime, but is less than before.  He has lost 6 lbs since last visit.  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  - Aaron Fox has currently uncontrolled symptomatic type 2 DM since 65 years of age, with most recent A1c of 9.7 %.   -Recent labs reviewed.  - I had a long discussion with him about the progressive nature of diabetes and the pathology behind its complications. -his diabetes is complicated by retinopathy, CKD, neuropathy, recent TMA and he remains at a high risk for more acute and chronic complications which include CAD, CVA, CKD, retinopathy, and neuropathy. These are all discussed in detail with him.  - Nutritional counseling repeated at each appointment due to patients tendency to fall back in to old habits.  - The patient admits there is a room for improvement in their diet and drink choices. -  Suggestion is made for the patient to avoid simple carbohydrates from their diet including Cakes, Sweet Desserts / Pastries, Ice Cream, Soda (diet and regular), Sweet Tea, Candies, Chips, Cookies, Sweet Pastries,  Store Bought Juices, Alcohol in Excess of  1-2 drinks a day, Artificial Sweeteners, Coffee Creamer, and "Sugar-free" Products. This will help patient to have stable blood glucose profile and potentially avoid unintended weight gain.   - I encouraged the patient to switch to  unprocessed or minimally processed complex starch and increased protein intake (animal or plant source), fruits, and vegetables.   - Patient is advised to stick to a routine mealtimes to eat 3 meals  a day and avoid unnecessary snacks ( to snack only to correct hypoglycemia).  - I have approached him with the following individualized plan to manage  his diabetes and patient  agrees:   -Based on his glucose readings and tight prandial glycemic  profile, it does not appear that he needs prandial insulin at this time.  We discussed the possibility of switching him to premixed insulin for optimal control and simplicity reasons, but he just got a refill of his NPH. He is advised to continue NPH 20 units SQ nightly for now and stop his Novolin R.  -he is encouraged to continue monitoring blood glucose twice daily, before breakfast and before bed, and to call the clinic if he has readings less than 70 or greater than 300 for 3 tests in a row.    - he is warned not to take insulin without proper monitoring per orders. - Adjustment parameters are given to him for hypo and hyperglycemia in writing.  - Specific targets for  A1c;  LDL, HDL,  and Triglycerides were discussed with the patient.  2) Blood Pressure /Hypertension:  his blood pressure is controlled to target.   he is advised to continue his current medications including Cozaar 50 mg p.o. daily with breakfast.  3) Lipids/Hyperlipidemia:    Review of his recent lipid panel from 05/07/20 showed controlled  LDL at 65 .  he  is advised to continue Lipitor 10 mg daily at bedtime.  Side effects and precautions discussed with him.  4)  Weight/Diet:  his Body mass index is 30.54 kg/m.  -   clearly complicating his diabetes care.   he is a candidate for weight loss. I discussed with him the fact that loss of 5 - 10% of his  current body weight will have the most impact on his diabetes management.  Exercise, and detailed carbohydrates information provided  -  detailed on discharge instructions.  5) Chronic Care/Health Maintenance: -he is on ACEI/ARB and Statin medications and is encouraged to initiate and continue to follow up with Ophthalmology, Dentist, Podiatrist at least yearly or according to recommendations, and advised to stay away from smoking. I have recommended yearly flu vaccine and pneumonia vaccine at least every 5 years; moderate intensity exercise for up to 150 minutes weekly; and  sleep for at least 7 hours a day.  - he is advised to maintain close follow up with [name], MD for primary care needs, as well as his other providers for optimal and coordinated care.   - Time spent on this patient care encounter:  40 min, of which > 50% was spent in  counseling and the rest reviewing his blood glucose logs , discussing his hypoglycemia and hyperglycemia episodes, reviewing his current and  previous labs / studies  ( including abstraction from other facilities) and medications  doses and developing a  long term treatment plan and documenting his care.   Please refer to Patient Instructions for Blood Glucose Monitoring and Insulin/Medications Dosing Guide"  in media tab for additional information. Please  also refer to " Patient Self Inventory" in the Media  tab for reviewed elements of pertinent patient history.  Aaron Fox participated in the discussions, expressed understanding, and voiced agreement with the above plans.  All questions were answered to his satisfaction. he is encouraged to contact clinic should he have any questions or concerns prior to his return visit.   Follow up plan: - Return in about 10 weeks (around 08/06/2020) for Diabetes follow up with A1c in office, No previsit labs, Bring glucometer and logs.  [name], [facility] [address] [place], [place] [address] Phone: [phone]  Fax: [phone]  05/28/2020, 8:46 AM

## 2020-05-28 NOTE — Patient Instructions (Signed)

## 2020-05-31 ENCOUNTER — Ambulatory Visit (INDEPENDENT_AMBULATORY_CARE_PROVIDER_SITE_OTHER): Payer: Medicare HMO | Admitting: *Deleted

## 2020-05-31 ENCOUNTER — Other Ambulatory Visit: Payer: Self-pay

## 2020-05-31 DIAGNOSIS — Z89439 Acquired absence of unspecified foot: Secondary | ICD-10-CM

## 2020-05-31 DIAGNOSIS — M2042 Other hammer toe(s) (acquired), left foot: Secondary | ICD-10-CM

## 2020-05-31 DIAGNOSIS — E1149 Type 2 diabetes mellitus with other diabetic neurological complication: Secondary | ICD-10-CM

## 2020-05-31 MED ORDER — FLUCONAZOLE 150 MG PO TABS: 150.0000 mg | ORAL_TABLET | Freq: Once | ORAL | 0 refills | Status: AC

## 2020-05-31 MED ORDER — ATORVASTATIN CALCIUM 10 MG PO TABS: 10.0000 mg | ORAL_TABLET | Freq: Every day | ORAL | 0 refills | Status: DC

## 2020-05-31 NOTE — Progress Notes (Signed)
Patient presents today to pick up diabetic shoes and insoles.  Patient noticed immediately the shoes we had ordered were not the shoes he ordered. He requested a black pair of the ones he already had in brown. He did try them on and he felt they were too wide.   Returning: [employer] 465 12 X-Wide  Reorder: [employer] [id number] Wide (patient had requested initially)  Patient will be contacted once reordered shoes arrive in office and an appointment will be made for fitting at that time.

## 2020-06-03 ENCOUNTER — Other Ambulatory Visit: Payer: Self-pay | Admitting: *Deleted

## 2020-06-03 ENCOUNTER — Other Ambulatory Visit: Payer: Medicare HMO

## 2020-06-03 MED ORDER — ATORVASTATIN CALCIUM 10 MG PO TABS: 10.0000 mg | ORAL_TABLET | Freq: Every day | ORAL | 0 refills | Status: DC

## 2020-06-04 ENCOUNTER — Ambulatory Visit: Payer: Medicare HMO | Admitting: Endocrinology

## 2020-06-07 ENCOUNTER — Telehealth: Payer: Self-pay | Admitting: Family Medicine

## 2020-06-07 NOTE — Progress Notes (Signed)
  Chronic Care Management   Outreach Note  06/07/2020 Name: Aaron Fox MRN: [id number] DOB: April 28, 1955  Referred by: [name], MD Reason for referral : No chief complaint on file.   An unsuccessful telephone outreach was attempted today. The patient was referred to the pharmacist for assistance with care management and care coordination.   Follow Up Plan:   Carley Perdue [employer] Scheduler

## 2020-06-07 NOTE — Progress Notes (Signed)
  Chronic Care Management   Note  06/07/2020 Name: Aaron Fox MRN: [id number] DOB: 25-Jan-1956  Tadao K Gaut is a 65 y.o. year old male who is a primary care patient of Pickard, Cammie Mcgee, MD. I reached out to Aaron Fox by phone today in response to a referral sent by [name]'s PCP, [name], MD.   [name] was given information about Chronic Care Management services today including:  1. CCM service includes personalized support from designated clinical staff supervised by his physician, including individualized plan of care and coordination with other care providers 2. 24/7 contact phone numbers for assistance for urgent and routine care needs. 3. Service will only be billed when office clinical staff spend 20 minutes or more in a month to coordinate care. 4. Only one practitioner may furnish and bill the service in a calendar month. 5. The patient may stop CCM services at any time (effective at the end of the month) by phone call to the office staff.   Patient agreed to services and verbal consent obtained.   Follow up plan:   Carley Perdue [employer] Scheduler

## 2020-06-09 ENCOUNTER — Telehealth: Payer: Self-pay | Admitting: Pharmacist

## 2020-06-09 NOTE — Progress Notes (Addendum)
    Chronic Care Management Pharmacy Assistant   Name: Aaron Fox  MRN: [id number] DOB: 1955-05-17  Reason for Encounter: Initial Questions  Medications: Outpatient Encounter Medications as of 06/09/2020  Medication Sig   ACCU-CHEK GUIDE test strip TEST BLOOD SUGAR 7 TIMES DAILY   Accu-Chek Softclix Lancets lancets USE TO MONITOR GLUCOSE LEVELS 7 TIMES DAILY   Alcohol Swabs (B-D SINGLE USE SWABS REGULAR) PADS Use to cleanse site prior to use of lancet to obtain droplet of blood two times per day; E10.49   aspirin 81 MG tablet Take 81 mg by mouth daily.   atorvastatin (LIPITOR) 10 MG tablet Take 1 tablet (10 mg total) by mouth daily.   Blood Glucose Monitoring Suppl (ACCU-CHEK GUIDE ME) w/Device KIT 1 each by Does not apply route See admin instructions. Use to monitor glucose levels 7 times daily; E11.9; WILL NOT COMPLETE PA   DROPLET INSULIN SYRINGE 29G X 1/2" 1 ML MISC USE IN THE MORNING AND AT BEDTIME   gabapentin (NEURONTIN) 300 MG capsule Take 2 capsules (600 mg total) by mouth 3 (three) times daily.   insulin NPH Human (NOVOLIN N) 100 UNIT/ML injection Inject 0.2 mLs (20 Units total) into the skin at bedtime.   losartan (COZAAR) 50 MG tablet TAKE 1 TABLET EVERY DAY   Tetrahydrozoline HCl (VISINE OP) Place 1 drop into both eyes daily as needed (redness).   triamcinolone (KENALOG) 0.1 % APPLY TO THE AFFECTED AREA(S) TOPICALLY THREE TIMES DAILY AS NEEDED FOR RASH   No facility-administered encounter medications on file as of 06/09/2020.   Have you seen any other providers since your last visit? Patient stated he did see another doctor recently but was unable to give me a exact name for the doctor.  Any changes in your medications or health? Patient stated no.  Any side effects from any medications? Patient stated no.  Do you have an symptoms or problems not managed by your medications? Patient stated no.  Any concerns about your health right now? Patient stated his diabetes.    Has your provider asked that you check blood pressure, blood sugar, or follow special diet at home? Patient stated he checks blood sugar 4 times a day.  Do you get any type of exercise on a regular basis? Patient stated he goes to work daily, and walks daily for 60 min.  Can you think of a goal you would like to reach for your health? Patient stated better control over his diabetes and to loose weight.  Do you have any problems getting your medications? Patient stated no.  Is there anything that you would like to discuss during the appointment? Patient stated no.  Please bring medications and supplements to appointment, patient reminded of OTP appointment on 4/15 at [place], RMA Clinical Pharmacist Assistant [phone]  5 minutes spent in review, coordination, and documentation.  Reviewed by: Beverly Milch, PharmD Clinical Pharmacist [facility] [phone]

## 2020-06-09 NOTE — Progress Notes (Signed)
Chronic Care Management Pharmacy Note  06/11/2020 Name:  Aaron Fox MRN:  [id number] DOB:  1955/08/07  Subjective: Aaron Fox is an 65 y.o. year old male who is a primary patient of Pickard, Cammie Mcgee, MD.  The CCM team was consulted for assistance with disease management and care coordination needs.    Engaged with patient by telephone for initial visit in response to provider referral for pharmacy case management and/or care coordination services.   Consent to Services:  The patient was given the following information about Chronic Care Management services today, agreed to services, and gave verbal consent: 1. CCM service includes personalized support from designated clinical staff supervised by the primary care provider, including individualized plan of care and coordination with other care providers 2. 24/7 contact phone numbers for assistance for urgent and routine care needs. 3. Service will only be billed when office clinical staff spend 20 minutes or more in a month to coordinate care. 4. Only one practitioner may furnish and bill the service in a calendar month. 5.The patient may stop CCM services at any time (effective at the end of the month) by phone call to the office staff. 6. The patient will be responsible for cost sharing (co-pay) of up to 20% of the service fee (after annual deductible is met). Patient agreed to services and consent obtained.  Patient Care Team: [name], MD as PCP - General (Family Medicine) Edythe Clarity, [facility] as Pharmacist (Pharmacist)  Recent office visits: 05/07/20 Dennard Schaumann) - patient referred to Endo at his request to manage sugars.  Most recently his blood glucose has been well controlled.  Recent consult visits: 05/28/2020 Marinus Maw, Endo) - patient was to stop meal time insulin due to having some episodes of hypoglycemia.  05/14/20 Marinus Maw, Endo) - Novolin N was decreased to 20 units at night due to hypoglycemia, only  to give meal time insulin in glucose is > 90.  04/09/20 Loanne Drilling) - no changes to medications, instructed to monitor blood glucose 7 times per day.  Hospital visits: None in previous 6 months  Objective:  Lab Results  Component Value Date   CREATININE 1.03 05/07/2020   BUN 19 05/07/2020   GFR 79.79 10/15/2017   GFRNONAA 76 05/07/2020   GFRAA 89 05/07/2020   NA 140 05/07/2020   K 4.4 05/07/2020   CALCIUM 9.5 05/07/2020   CO2 22 05/07/2020   GLUCOSE 99 05/07/2020    Lab Results  Component Value Date/Time   HGBA1C 9.7 (A) 04/09/2020 07:41 AM   HGBA1C 6.7 (A) 12/19/2019 07:35 AM   HGBA1C 11.4 (H) 04/21/2019 01:13 PM   HGBA1C 6.7 (H) 07/20/2014 08:11 AM   GFR 79.79 10/15/2017 08:18 AM   GFR 85.11 04/28/2016 08:04 AM   MICROALBUR 7.9 05/07/2020 08:16 AM   MICROALBUR 17.5 (H) 10/15/2017 08:18 AM    Last diabetic Eye exam:  Lab Results  Component Value Date/Time   HMDIABEYEEXA Retinopathy (A) 11/21/2018 12:00 AM    Last diabetic Foot exam:  Lab Results  Component Value Date/Time   HMDIABFOOTEX yes 06/15/2008 12:00 AM     Lab Results  Component Value Date   CHOL 127 05/07/2020   HDL 47 05/07/2020   LDLCALC 65 05/07/2020   LDLDIRECT 120.2 04/04/2013   TRIG 71 05/07/2020   CHOLHDL 2.7 05/07/2020    Hepatic Function Latest Ref Rng & Units 05/07/2020 04/21/2019 10/25/2018  Total Protein 6.1 - 8.1 g/dL 7.4 8.0 6.8  Albumin 3.5 - 5.2 g/dL - - -  AST 10 - 35 U/L 84(H) 44(H) 48(H)  ALT 9 - 46 U/L 53(H) 23 32  Alk Phosphatase 39 - 117 U/L - - -  Total Bilirubin 0.2 - 1.2 mg/dL 0.5 0.7 0.4  Bilirubin, Direct 0.0 - 0.3 mg/dL - - -    Lab Results  Component Value Date/Time   TSH 1.10 10/15/2017 08:18 AM   TSH 1.63 04/28/2016 08:04 AM    CBC Latest Ref Rng & Units 05/07/2020 05/05/2019 04/26/2019  WBC 3.8 - 10.8 Thousand/uL 3.5(L) 4.5 5.1  Hemoglobin 13.2 - 17.1 g/dL 12.5(L) 10.5(L) 10.2(L)  Hematocrit 38.5 - 50.0 % 38.5 31.2(L) 30.6(L)  Platelets 140 - 400 Thousand/uL  240 244 168    No results found for: VD25OH  Clinical ASCVD: No  The ASCVD Risk score Mikey Bussing DC Jr., et al., 2013) failed to calculate for the following reasons:   The valid total cholesterol range is 130 to 320 mg/dL    Depression screen PHQ 2/9 05/07/2020  Decreased Interest 0  Down, Depressed, Hopeless 0  PHQ - 2 Score 0  Some recent data might be hidden     Social History   Tobacco Use  Smoking Status Former Smoker  . Quit date: 11/02/1999  . Years since quitting: 20.6  Smokeless Tobacco Never Used   BP Readings from Last 3 Encounters:  05/28/20 137/78  05/14/20 (!) 144/69  05/07/20 128/74   Pulse Readings from Last 3 Encounters:  05/28/20 (!) 57  05/14/20 (!) 54  05/07/20 76   Wt Readings from Last 3 Encounters:  05/28/20 219 lb (99.3 kg)  05/14/20 225 lb (102.1 kg)  05/07/20 221 lb (100.2 kg)   BMI Readings from Last 3 Encounters:  05/28/20 30.54 kg/m  05/14/20 31.38 kg/m  05/07/20 30.82 kg/m    Assessment/Interventions: Review of patient past medical history, allergies, medications, health status, including review of consultants reports, laboratory and other test data, was performed as part of comprehensive evaluation and provision of chronic care management services.   SDOH:  (Social Determinants of Health) assessments and interventions performed: Yes  SDOH Screenings   Alcohol Screen: Low Risk   . Last Alcohol Screening Score (AUDIT): 0  Depression (PHQ2-9): Low Risk   . PHQ-2 Score: 0  Financial Resource Strain: Low Risk   . Difficulty of Paying Living Expenses: Not very hard  Food Insecurity: Not on file  Housing: Not on file  Physical Activity: Not on file  Social Connections: Not on file  Stress: Not on file  Tobacco Use: Medium Risk  . Smoking Tobacco Use: Former Smoker  . Smokeless Tobacco Use: Never Used  Transportation Needs: Not on file    CCM Care Plan  Allergies  Allergen Reactions  . Penicillins Swelling    Swelling of the  hands and feet, skin peeling Did it involve swelling of the face/tongue/throat, SOB, or low BP? No Did it involve sudden or severe rash/hives, skin peeling, or any reaction on the inside of your mouth or nose? No Did you need to seek medical attention at a hospital or doctor's office? Yes When did it last happen?10 + years If all above answers are "NO", may proceed with cephalosporin use.   . Ciprofloxacin Hives and Rash  . Terbinafine Hcl Hives  . Zestril [Lisinopril] Cough    Medications Reviewed Today    Reviewed by Edythe Clarity, [facility] (Pharmacist) on 06/11/20 at 1134  Med List Status: <None>  Medication Order Taking? Sig Documenting Provider Last Dose Status Informant  ACCU-CHEK GUIDE test strip [id number] Yes TEST BLOOD SUGAR 7 TIMES DAILY Renato Shin, MD Taking Active   Accu-Chek Softclix Lancets lancets [id number] Yes USE TO MONITOR GLUCOSE LEVELS 7 TIMES DAILY Renato Shin, MD Taking Active   Alcohol Swabs (B-D SINGLE USE SWABS REGULAR) PADS [id number] Yes Use to cleanse site prior to use of lancet to obtain droplet of blood two times per day; E10.49 Renato Shin, MD Taking Active Self  aspirin 81 MG tablet [id number] Yes Take 81 mg by mouth daily. [provider] Taking Active Self  atorvastatin (LIPITOR) 10 MG tablet [id number] Yes Take 1 tablet (10 mg total) by mouth daily. [name], MD Taking Active   Blood Glucose Monitoring Suppl (ACCU-CHEK GUIDE ME) w/Device KIT [id number] Yes 1 each by Does not apply route See admin instructions. Use to monitor glucose levels 7 times daily; E11.9; WILL NOT COMPLETE PA Renato Shin, MD Taking Active   DROPLET INSULIN SYRINGE 29G X 1/2" 1 ML MISC [id number] Yes USE IN THE MORNING AND AT BEDTIME Renato Shin, MD Taking Active   gabapentin (NEURONTIN) 300 MG capsule [id number] Yes Take 2 capsules (600 mg total) by mouth 3 (three) times daily. [name], MD Taking Active   insulin NPH Human (NOVOLIN N) 100 UNIT/ML  injection [id number] Yes Inject 0.2 mLs (20 Units total) into the skin at bedtime. Brita Romp, NP Taking Active   losartan (COZAAR) 50 MG tablet [id number] Yes TAKE 1 TABLET EVERY DAY  Patient taking differently: 100 mg.   [name], MD Taking Active   Tetrahydrozoline HCl [facility] OP) [id number] Yes Place 1 drop into both eyes daily as needed (redness). [provider] Taking Active Self  triamcinolone (KENALOG) 0.1 % [id number] Yes APPLY TO THE AFFECTED AREA(S) TOPICALLY THREE TIMES DAILY AS NEEDED FOR RASH [name], MD Taking Active           Patient Active Problem List   Diagnosis Date Noted  . Osteomyelitis ([place]) 04/25/2019  . Diabetic retinopathy of both eyes ([place])   . Low back pain 12/17/2017  . Irritant contact dermatitis 09/21/2016  . Cervical radiculitis 02/08/2016  . Hx of adenomatous colonic polyps 06/09/2015  . Dyspnea on exertion 05/03/2015  . Lymphadenopathy, retroperitoneal 09/17/2014  . PSA elevation 05/27/2014  . Iron deficiency anemia 05/27/2014  . Wellness examination 04/20/2014  . Diabetic foot ulcer ([place]) 11/21/2013  . Type 1 diabetes mellitus with neurological manifestations, uncontrolled ([place]) 10/22/2013  . Piriformis syndrome of left side 08/25/2013  . Dyspnea 07/24/2013  . Knee pain 07/24/2013  . Routine general medical examination at a health care facility 07/24/2013  . Numbness and tingling in hands 10/14/2012  . Encounter for long-term (current) use of other medications 11/13/2011  . Disturbance of skin sensation 11/13/2011  . Screening for prostate cancer 11/13/2011  . 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 Shoulder pain, right 11/13/2011  . BALANITIS 09/01/2009  . HYPOKALEMIA 05/31/2009  . Essential hypertension 05/31/2009  . LEG PAIN, BILATERAL 02/15/2009  . HYPERCHOLESTEROLEMIA 07/02/2008  . ALLERGIC RHINITIS 07/02/2008  . CHF 06/19/2008  . MICROSCOPIC HEMATURIA 06/19/2008  . 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 Hypogonadism male 01/15/2007  . Diabetes ([place]) 09/22/2006  . Gout 09/22/2006  . RHEUMATOID ARTHRITIS 09/22/2006    Immunization History  Administered Date(s) Administered  . Influenza Whole 12/28/2008  . Influenza,inj,Quad PF,6+ Mos 11/21/2013, 04/01/2015, 12/20/2015, 01/08/2017, 12/17/2017, 10/25/2018, 12/19/2019  . Moderna Sars-Covid-2 Vaccination 04/29/2019, 05/27/2019, 12/29/2019  . Pneumococcal Polysaccharide-23  06/11/2008  . Td 09/29/2005  . Tdap 10/15/2017    Conditions to be addressed/monitored:  HTN, Type I Diabetes w/ Neuropathy, Hypercholesterolemia  Care Plan : General Pharmacy (Adult)  Updates made by Edythe Clarity, RPH since 06/11/2020 12:00 AM    Problem: HTN, DM, HLD   Priority: High  Onset Date: 06/11/2020    Long-Range Goal: Patient-Specific Goal   Start Date: 06/11/2020  Expected End Date: 12/11/2020  This Visit's Progress: On track  Priority: High  Note:   Current Barriers:  . Unable to independently monitor therapeutic efficacy . Unable to achieve control of glucose   Pharmacist Clinical Goal(s):  Marland Kitchen Patient will verbalize ability to afford treatment regimen . achieve adherence to monitoring guidelines and medication adherence to achieve therapeutic efficacy . adhere to prescribed medication regimen as evidenced by fill dates . contact provider office for questions/concerns as evidenced notation of same in electronic health record through collaboration with PharmD and provider.   Interventions: . 1:1 collaboration with [name], MD regarding development and update of comprehensive plan of care as evidenced by provider attestation and co-signature . Inter-disciplinary care team collaboration (see longitudinal plan of care) . Comprehensive medication review performed; medication list updated in electronic medical record  Hypertension (BP goal <130/80) -Controlled -Current treatment: . 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 Atorvastatin 136m-Medications previously tried: Simvastatin (efficacy?)  -Current dietary patterns: see above -Current exercise habits: see above -Educated on Cholesterol goals;  Benefits of statin for ASCVD risk reduction; Importance of limiting foods high in cholesterol;  -Reviewed most recent lipid panel, excellent control -Appropriate intensity (moderate) for DM patient -Recommended to continue current medication  Type I Diabetes w/ neuropathy (A1c goal <7%) -Uncontrolled -Current medications: . Novolin N 20 units hs -Medications previously tried: Novolin R -Current home glucose readings . fasting glucose: 130-160 . post prandial glucose:  -Denies hypoglycemic/hyperglycemic symptoms -Current meal patterns: baked instead of fried, eats a lot of salads, no soda intake -Current exercise: see above -Educated on A1c and blood sugar goals; Complications of diabetes including kidney damage, retinal damage, and cardiovascular disease; Exercise goal of 150 minutes per week; Prevention and management of hypoglycemic episodes; Benefits of routine self-monitoring of blood  sugar;  -He recently stopped meal time insulin due to hypoglycemia.  He reports only one episode of hypoglycemia since then when he went a long time without eating. -Reports that his increased A1c was due to him not controlling his diet like he was previously. -Counseled to check feet daily and get yearly eye exams -Recommended to continue current medication Recommended he eat consistent meals throughout the day to avoid hyoglycemia.  Encouraged him to get back on track with diet and get A1c to goal.  He is motivated to do so.  Patient Goals/Self-Care Activities . Patient will:  - take medications as prescribed check glucose daily, document, and provide at future appointments check blood pressure if able, document, and provide at future appointments target a minimum of 150 minutes of moderate intensity exercise weekly  Follow Up Plan: The care management team will reach out to the patient again over the next 90 days.         Medication Assistance: None required.  Patient affirms current coverage meets needs.  Patient's preferred pharmacy is:  [place], [name] - [address] [name] [address] Phone: [phone] Fax: [phone]  [facility] - [place], [address] [place] [address] Phone: [phone] Fax: [phone]  Uses pill box? No - does not miss doses Pt endorses 100% compliance  We discussed: Benefits of medication synchronization, packaging and delivery as well as enhanced pharmacist oversight with [facility]. Patient decided to: Utilize [facility] pharmacy for medication synchronization, packaging and delivery  Care Plan and Follow Up Patient Decision:  Patient agrees to Care Plan and Follow-up.  Plan: The care management team will reach out to the patient again over the next 90 days.  Beverly Milch, PharmD Clinical Pharmacist [place] [phone]

## 2020-06-11 ENCOUNTER — Ambulatory Visit (INDEPENDENT_AMBULATORY_CARE_PROVIDER_SITE_OTHER): Payer: Medicare HMO | Admitting: Pharmacist

## 2020-06-11 DIAGNOSIS — E7800 Pure hypercholesterolemia, unspecified: Secondary | ICD-10-CM

## 2020-06-11 DIAGNOSIS — I10 Essential (primary) hypertension: Secondary | ICD-10-CM | POA: Diagnosis not present

## 2020-06-11 DIAGNOSIS — E1049 Type 1 diabetes mellitus with other diabetic neurological complication: Secondary | ICD-10-CM

## 2020-06-11 DIAGNOSIS — E1065 Type 1 diabetes mellitus with hyperglycemia: Secondary | ICD-10-CM | POA: Diagnosis not present

## 2020-06-11 DIAGNOSIS — IMO0002 Reserved for concepts with insufficient information to code with codable children: Secondary | ICD-10-CM

## 2020-06-11 NOTE — Patient Instructions (Addendum)
Visit Information  Goals Addressed            This Visit's Progress   . Monitor and Manage My Blood Sugar-Diabetes Type 1       Timeframe:  Long-Range Goal Priority:  High Start Date:   06/11/20                          Expected End Date:  12/11/20                     Follow Up Date 09/26/20   - check blood sugar at prescribed times - check blood sugar if I feel it is too high or too low - enter blood sugar readings and medication or insulin into daily log - take the blood sugar log to all doctor visits    Why is this important?    Checking your blood sugar at home helps to keep it from getting very high or very low.   Writing the results in a diary or log helps the doctor know how to care for you.   Your blood sugar log should have the time, the date and the results.   Also, write down the amount of insulin or other medicine you take.   Other information like what you ate, exercise done and how you were feeling will also be helpful..     Notes: Goal fasting 80-130      Patient Care Plan: General Pharmacy (Adult)    Problem Identified: HTN, DM, HLD   Priority: High  Onset Date: 06/11/2020    Long-Range Goal: Patient-Specific Goal   Start Date: 06/11/2020  Expected End Date: 12/11/2020  This Visit's Progress: On track  Priority: High  Note:   Current Barriers:  . Unable to independently monitor therapeutic efficacy . Unable to achieve control of glucose   Pharmacist Clinical Goal(s):  Marland Kitchen Patient will verbalize ability to afford treatment regimen . achieve adherence to monitoring guidelines and medication adherence to achieve therapeutic efficacy . adhere to prescribed medication regimen as evidenced by fill dates . contact provider office for questions/concerns as evidenced notation of same in electronic health record through collaboration with PharmD and provider.   Interventions: . 1:1 collaboration with [name], MD regarding development and update  of comprehensive plan of care as evidenced by provider attestation and co-signature . Inter-disciplinary care team collaboration (see longitudinal plan of care) . Comprehensive medication review performed; medication list updated in electronic medical record  Hypertension (BP goal <130/80) -Controlled -Current treatment: . 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 Novolin N 20 units hs -Medications previously tried: Novolin R -Current home glucose readings . fasting glucose: 130-160 . post prandial glucose:  -Denies hypoglycemic/hyperglycemic symptoms -Current meal patterns: baked instead of  fried, eats a lot of salads, no soda intake -Current exercise: see above -Educated on A1c and blood sugar goals; Complications of diabetes including kidney damage, retinal damage, and cardiovascular disease; Exercise goal of 150 minutes per week; Prevention and management of hypoglycemic episodes; Benefits of routine self-monitoring of blood sugar;  -He recently stopped meal time insulin due to hypoglycemia.  He reports only one episode of hypoglycemia since then when he went a long time without eating. -Reports that his increased A1c was due to him not controlling his diet like he was previously. -Counseled to check feet daily and get yearly eye exams -Recommended to continue current medication Recommended he eat consistent meals throughout the day to avoid hyoglycemia.  Encouraged him to get back on track with diet and get A1c to goal.  He is motivated to do so.   Patient Goals/Self-Care Activities . Patient will:  - take medications as prescribed check glucose daily, document, and provide at future appointments check blood pressure if able, document, and provide at future appointments target a minimum of 150 minutes of moderate intensity exercise weekly  Follow Up Plan: The care management team will reach out to the patient again over the next 90 days.        Mr. Dinovo was given information about Chronic Care Management services today including:  1. CCM service includes personalized support from designated clinical staff supervised by his physician, including individualized plan of care and coordination with other care providers 2. 24/7 contact phone numbers for assistance for urgent and routine care needs. 3. Standard insurance, coinsurance, copays and deductibles apply for chronic care management only during months in which we provide at least 20 minutes of these services. Most insurances cover these services at 100%, however patients may be responsible for any copay,  coinsurance and/or deductible if applicable. This service may help you avoid the need for more expensive face-to-face services. 4. Only one practitioner may furnish and bill the service in a calendar month. 5. The patient may stop CCM services at any time (effective at the end of the month) by phone call to the office staff.  Patient agreed to services and verbal consent obtained.   The patient verbalized understanding of instructions, educational materials, and care plan provided today and agreed to receive a mailed copy of patient instructions, educational materials, and care plan.  Telephone follow up appointment with pharmacy team member scheduled for: 3 months  Edythe Clarity, [facility]

## 2020-06-14 ENCOUNTER — Telehealth: Payer: Self-pay | Admitting: Nurse Practitioner

## 2020-06-14 ENCOUNTER — Ambulatory Visit: Payer: Medicare HMO | Admitting: Podiatry

## 2020-06-14 ENCOUNTER — Other Ambulatory Visit: Payer: Self-pay

## 2020-06-14 DIAGNOSIS — B351 Tinea unguium: Secondary | ICD-10-CM

## 2020-06-14 DIAGNOSIS — E1149 Type 2 diabetes mellitus with other diabetic neurological complication: Secondary | ICD-10-CM | POA: Diagnosis not present

## 2020-06-14 DIAGNOSIS — Z89439 Acquired absence of unspecified foot: Secondary | ICD-10-CM

## 2020-06-14 DIAGNOSIS — M79675 Pain in left toe(s): Secondary | ICD-10-CM

## 2020-06-14 DIAGNOSIS — L84 Corns and callosities: Secondary | ICD-10-CM | POA: Diagnosis not present

## 2020-06-14 MED ORDER — NOVOLIN 70/30 FLEXPEN (70-30) 100 UNIT/ML ~~LOC~~ SUPN: 10.0000 [IU] | PEN_INJECTOR | Freq: Two times a day (BID) | SUBCUTANEOUS | 3 refills | Status: DC

## 2020-06-14 NOTE — Progress Notes (Signed)
The patient presented to the office today to pick up diabetic shoes and diabetic custom inserts.  1 pair of inserts were put in the shoes and the shoes were fitted to the patient. The patient states they are comfortable and stated that these were not the diabetic shoes patient had picked out.  I stated that I would re-order [id number] size 12 wide.  Patient would be contacted when the shoes are ready for pick up.

## 2020-06-14 NOTE — Telephone Encounter (Signed)
Pt states that he was supposed to call when he was almost out of the Novolin N. He is taking 20 units qam. He wants this sent to [facility] in [place].   Last few BG readings 06/13/20     134          116 06/14/20     157

## 2020-06-14 NOTE — Telephone Encounter (Signed)
Yes, we talked about switching him to premixed insulin instead for simplicity reasons.  Lets change him to 70/30 10 units twice daily with meals (breakfast and supper), if glucose is above 90 and he is eating.  He will not take any additional insulin along with this one.

## 2020-06-14 NOTE — Progress Notes (Signed)
Subjective: 65 y.o. returns the office today for painful, elongated, thickened toenails which he cannot trim himself as well as for callus on his left third toe.  Denies any ulcerations.  He also presents to pick up diabetic shoes, inserts.  He has no other concerns today.  PCP: [name], MD  Endocrinologist: Renato Shin, MD   A1c:9.7 on 04/09/2020  Objective: AAO 3, NAD DP/PT pulses palpable, CRT less than 3 seconds Protective sensation decreased with Simms Weinstein monofilament Nails hypertrophic, dystrophic, elongated, brittle, discolored 5. There is tenderness overlying the nails 1-5 bilaterally. There is no surrounding erythema or drainage along the nail sites. Hyperkeratotic lesion distal aspect left third toe without any underlying ulceration drainage or any signs of infection. 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 No pain with calf compression, swelling, warmth, erythema.  Assessment: Patient presents with symptomatic onychomycosis; type 2 diabetes with history of right transmetatarsal mutation, preulcerative callus left foot  Plan: -Treatment options including alternatives, risks, complications were discussed -Nails sharply debrided 5 without complication/bleeding. -Sharply debrided the hyperkeratotic lesion left foot without any complications or bleeding. Offloading pad dispensed -Dispensed diabetic shoes today however he did not like the shoes.  We have reordered these for him. Cranford Mon, CMA took care of this today.  -Discussed glucose control -Discussed daily foot inspection. If there are any changes, to call the office immediately.  -Follow-up in 2 months or sooner if any problems are to arise. In the meantime, encouraged to call the office with any questions, concerns, changes symptoms.  Celesta Gentile, DPM

## 2020-06-14 NOTE — Telephone Encounter (Signed)
Returned call to patient to advise, left VM with instructions.

## 2020-06-14 NOTE — Patient Instructions (Signed)

## 2020-06-15 ENCOUNTER — Other Ambulatory Visit: Payer: Self-pay

## 2020-06-15 DIAGNOSIS — E1165 Type 2 diabetes mellitus with hyperglycemia: Secondary | ICD-10-CM

## 2020-06-15 MED ORDER — NOVOLIN 70/30 FLEXPEN (70-30) 100 UNIT/ML ~~LOC~~ SUPN: 10.0000 [IU] | PEN_INJECTOR | Freq: Two times a day (BID) | SUBCUTANEOUS | 3 refills | Status: DC

## 2020-06-15 NOTE — Telephone Encounter (Signed)
Returned call to patient, he thought that we could call his insurance company to see if medication was covered under his plan before sending it in to [facility] order, I advised that we would send it in and they would let us know if it requires a prior auth or not. He was still picking up the first script from [facility]

## 2020-06-15 NOTE — Telephone Encounter (Signed)
Pt called and states he has [employer] and they require the doctor office to call and see which rx they cover instead of telling the pt. # [phone]

## 2020-06-28 ENCOUNTER — Other Ambulatory Visit: Payer: Self-pay | Admitting: Endocrinology

## 2020-06-28 ENCOUNTER — Ambulatory Visit (INDEPENDENT_AMBULATORY_CARE_PROVIDER_SITE_OTHER): Payer: Medicare HMO | Admitting: *Deleted

## 2020-06-28 ENCOUNTER — Other Ambulatory Visit: Payer: Self-pay

## 2020-06-28 DIAGNOSIS — Z89439 Acquired absence of unspecified foot: Secondary | ICD-10-CM | POA: Diagnosis not present

## 2020-06-28 DIAGNOSIS — L84 Corns and callosities: Secondary | ICD-10-CM | POA: Diagnosis not present

## 2020-06-28 DIAGNOSIS — E1149 Type 2 diabetes mellitus with other diabetic neurological complication: Secondary | ICD-10-CM

## 2020-06-28 DIAGNOSIS — M2042 Other hammer toe(s) (acquired), left foot: Secondary | ICD-10-CM | POA: Diagnosis not present

## 2020-06-28 DIAGNOSIS — E11319 Type 2 diabetes mellitus with unspecified diabetic retinopathy without macular edema: Secondary | ICD-10-CM

## 2020-06-28 NOTE — Progress Notes (Signed)
Patient presents today to pick up diabetic shoes and insoles.  Patient was dispensed 1 pair of diabetic shoes and 3 pairs of foam casted diabetic insoles. Fit was satisfactory. Instructions for break-in and wear was reviewed and a copy was given to the patient.   Re-appointment for regularly scheduled diabetic foot care visits or if they should experience any trouble with the shoes or insoles.  

## 2020-07-23 ENCOUNTER — Telehealth: Payer: Self-pay | Admitting: Pharmacist

## 2020-07-23 NOTE — Progress Notes (Addendum)
Chronic Care Management Pharmacy Assistant   Name: Aaron Fox  MRN: [id number] DOB: [birth year]  Reason for Encounter: Disease State For DM.   Conditions to be addressed/monitored: HTN, Type I Diabetes w/ Neuropathy, Hypercholesterolemia.  Recent office visits:  None since 06/11/20  Recent consult visits:  06/14/20 Endo Marinus Maw, Juanetta Beets, NP. CHANGED Insulin NPH Isophane & Regular (70-30) 100 Unit/ML to 10 units subcutaneous 2 times daily with meals, with breakfast and supper if glucose is above 90 and you are eating.  06/14/20 Podiatry Trula Slade, DPM. For follow-up for diabetic shoes. No medication changes.   Hospital visits:  None since 06/11/20  Medications: Outpatient Encounter Medications as of 07/23/2020  Medication Sig   ACCU-CHEK GUIDE test strip TEST BLOOD SUGAR 7 TIMES DAILY   Accu-Chek Softclix Lancets lancets TEST  7  TIMES  DAILY   Alcohol Swabs (B-D SINGLE USE SWABS REGULAR) PADS Use to cleanse site prior to use of lancet to obtain droplet of blood two times per day; E10.49   aspirin 81 MG tablet Take 81 mg by mouth daily.   atorvastatin (LIPITOR) 10 MG tablet Take 1 tablet (10 mg total) by mouth daily.   Blood Glucose Monitoring Suppl (ACCU-CHEK GUIDE ME) w/Device KIT 1 each by Does not apply route See admin instructions. Use to monitor glucose levels 7 times daily; E11.9; WILL NOT COMPLETE PA   DROPLET INSULIN SYRINGE 29G X 1/2" 1 ML MISC USE IN THE MORNING AND AT BEDTIME   gabapentin (NEURONTIN) 300 MG capsule Take 2 capsules (600 mg total) by mouth 3 (three) times daily.   insulin isophane & regular human (NOVOLIN 70/30 FLEXPEN) (70-30) 100 UNIT/ML KwikPen Inject 10 Units into the skin 2 (two) times daily with a meal. With breakfast and supper if glucose is above 90 AND you are eating.   losartan (COZAAR) 50 MG tablet TAKE 1 TABLET EVERY DAY (Patient taking differently: 100 mg.)   Tetrahydrozoline HCl (VISINE OP) Place 1 drop into both eyes daily as  needed (redness).   triamcinolone (KENALOG) 0.1 % APPLY TO THE AFFECTED AREA(S) TOPICALLY THREE TIMES DAILY AS NEEDED FOR RASH   No facility-administered encounter medications on file as of 07/23/2020.    Recent Relevant Labs: Lab Results  Component Value Date/Time   HGBA1C 9.7 (A) 04/09/2020 07:41 AM   HGBA1C 6.7 (A) 12/19/2019 07:35 AM   HGBA1C 11.4 (H) 04/21/2019 01:13 PM   HGBA1C 6.7 (H) 07/20/2014 08:11 AM   MICROALBUR 7.9 05/07/2020 08:16 AM   MICROALBUR 17.5 (H) 10/15/2017 08:18 AM    Kidney Function Lab Results  Component Value Date/Time   CREATININE 1.03 05/07/2020 08:16 AM   CREATININE 1.10 05/05/2019 12:36 PM   GFR 79.79 10/15/2017 08:18 AM   GFRNONAA 76 05/07/2020 08:16 AM   GFRAA 89 05/07/2020 08:16 AM    Current antihyperglycemic regimen:  Insulin NPH Isophane & Regular (70-30) 100 Unit/ML to 10 units subcutaneous 2 times daily with meals, with breakfast and supper if glucose is above 90 and you are eating.   What recent interventions/DTPs have been made to improve glycemic control: Insulin NPH Isophane & Regular (70-30) 100 Unit/ML to 10 units subcutaneous 2 times daily with meals, with breakfast and supper if glucose is above 90 and you are eating.   Have there been any recent hospitalizations or ED visits since last visit with CPP? Patient stated no.  Patient denies hypoglycemic symptoms, including None   Patient denies hyperglycemic symptoms, including none  How often are you checking your blood sugar? 3-4 times daily   What are your blood sugars ranging?  132 lunch 07/26/20 106 bedtime 07/25/20 113 evening 07/25/20  During the week, how often does your blood glucose drop below 70? 07/24/20 67 before lunch.   Are you checking your feet daily/regularly?  Patient stated he does check his feet regularly.   Adherence Review: Is the patient currently on a STATIN medication? Atorvastatin 10 mg  Is the patient currently on ACE/ARB medication?  Losartan 50  mg   Does the patient have >5 day gap between last estimated fill dates? Per misc rpts, no.  Star Rating Drugs: Atorvastatin 10 mg 90 DS 06/04/20, Gabapentin 300 mg 90 DS 02/27/20 Losartan 50 mg 90 DS 05/13/20  Follow-Up:Pharmacist Review  Patient stated with the change of his insulin it seems like his blood sugar has been running higher no matter what he eats.  Charlann Lange, [place] Clinical Pharmacist Assistant [phone]

## 2020-08-04 ENCOUNTER — Other Ambulatory Visit: Payer: Self-pay | Admitting: Family Medicine

## 2020-08-06 ENCOUNTER — Other Ambulatory Visit: Payer: Self-pay

## 2020-08-06 ENCOUNTER — Encounter: Payer: Self-pay | Admitting: Nurse Practitioner

## 2020-08-06 ENCOUNTER — Ambulatory Visit (INDEPENDENT_AMBULATORY_CARE_PROVIDER_SITE_OTHER): Payer: Medicare HMO | Admitting: Nurse Practitioner

## 2020-08-06 VITALS — BP 124/75 | HR 60 | Ht 71.0 in | Wt 215.2 lb

## 2020-08-06 DIAGNOSIS — Z794 Long term (current) use of insulin: Secondary | ICD-10-CM | POA: Diagnosis not present

## 2020-08-06 DIAGNOSIS — E1165 Type 2 diabetes mellitus with hyperglycemia: Secondary | ICD-10-CM

## 2020-08-06 LAB — POCT GLYCOSYLATED HEMOGLOBIN (HGB A1C): HbA1c, POC (controlled diabetic range): 7 % (ref 0.0–7.0)

## 2020-08-06 NOTE — Patient Instructions (Signed)

## 2020-08-06 NOTE — Progress Notes (Signed)
Endocrinology Follow Up Note       08/06/2020, 9:27 AM   Subjective:    Patient ID: Aaron Fox, male    DOB: 1956-01-12.  Aaron Fox is being seen in follow up after being seen in consultation for management of currently uncontrolled symptomatic diabetes requested by  [name], MD.   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 Talbert Forest   CATARACT EXTRACTION, BILATERAL     COLONOSCOPY  2004   LYMPH NODE BIOPSY Right 10/16/2014   Procedure: RIGHT INGUINAL LYMPH NODE BIOPSY;  Surgeon: Aviva Signs, MD;  Location: [facility];  Service: General;  Laterality: Right;    Social History   Socioeconomic History   Marital status: Married    Spouse name: Not on file   Number of children: 3   Years of education: Not on file   Highest  education level: Not on file  Occupational History   Occupation: Disability    Employer: [employer]  Tobacco Use   Smoking status: Former    Pack years: 0.00    Types: Cigarettes    Quit date: 11/02/1999    Years since quitting: 20.7   Smokeless tobacco: Never  Substance and Sexual Activity   Alcohol use: No    Alcohol/week: 0.0 standard drinks   Drug use: No   Sexual activity: Not on file  Other Topics Concern   Not on file  Social History Narrative   ** Merged History Encounter **       Married, 3 kids Prior work [employer] before disability Regular exercise-yes   Social Determinants of Radio broadcast assistant Strain: Low Risk    Difficulty of Paying Living Expenses: Not very hard  Food Insecurity: Not on file  Transportation Needs: Not on file  Physical Activity: Not on file  Stress: Not on file  Social Connections: Not on file    Family History  Problem Relation Age of Onset   Heart disease Other  FH of CAD   Colon polyps Paternal Uncle    Kidney disease Paternal Uncle    Diabetes Paternal Grandfather    Diabetes Maternal Uncle    Cancer Neg Hx    Colon cancer Neg Hx    Gallbladder disease Neg Hx    Esophageal cancer Neg Hx    Rectal cancer Neg Hx    Stomach cancer Neg Hx     Outpatient Encounter Medications as of 08/06/2020  Medication Sig   ACCU-CHEK GUIDE test strip TEST BLOOD SUGAR 7 TIMES DAILY   Accu-Chek Softclix Lancets lancets TEST  7  TIMES  DAILY   Alcohol Swabs (B-D SINGLE USE SWABS REGULAR) PADS Use to cleanse site prior to use of lancet to obtain droplet of blood two times per day; E10.49   aspirin 81 MG tablet Take 81 mg by mouth daily.   atorvastatin (LIPITOR) 10 MG tablet TAKE 1 TABLET EVERY DAY   Blood Glucose Monitoring Suppl (ACCU-CHEK GUIDE ME) w/Device KIT 1 each by Does not apply route See admin instructions. Use to monitor glucose levels 7 times daily; E11.9; WILL NOT COMPLETE PA   DROPLET INSULIN SYRINGE 29G X 1/2" 1 ML MISC  USE IN THE MORNING AND AT BEDTIME   gabapentin (NEURONTIN) 300 MG capsule Take 2 capsules (600 mg total) by mouth 3 (three) times daily.   insulin isophane & regular human (NOVOLIN 70/30 FLEXPEN) (70-30) 100 UNIT/ML KwikPen Inject 10 Units into the skin 2 (two) times daily with a meal. With breakfast and supper if glucose is above 90 AND you are eating.   losartan (COZAAR) 50 MG tablet TAKE 1 TABLET EVERY DAY (Patient taking differently: 100 mg.)   Tetrahydrozoline HCl (VISINE OP) Place 1 drop into both eyes daily as needed (redness).   triamcinolone (KENALOG) 0.1 % APPLY TO THE AFFECTED AREA(S) TOPICALLY THREE TIMES DAILY AS NEEDED FOR RASH   No facility-administered encounter medications on file as of 08/06/2020.    ALLERGIES: Allergies  Allergen Reactions   Penicillins Swelling    Swelling of the hands and feet, skin peeling Did it involve swelling of the face/tongue/throat, SOB, or low BP? No Did it involve sudden or severe rash/hives, skin peeling, or any reaction on the inside of your mouth or nose? No Did you need to seek medical attention at a hospital or doctor's office? Yes When did it last happen?      10 + years If all above answers are "NO", may proceed with cephalosporin use.    Ciprofloxacin Hives and Rash   Terbinafine Hcl Hives   Zestril [Lisinopril] Cough    VACCINATION STATUS: Immunization History  Administered Date(s) Administered   Influenza Whole 12/28/2008   Influenza,inj,Quad PF,6+ Mos 11/21/2013, 04/01/2015, 12/20/2015, 01/08/2017, 12/17/2017, 10/25/2018, 12/19/2019   Moderna Sars-Covid-2 Vaccination 04/29/2019, 05/27/2019, 12/29/2019   Pneumococcal Polysaccharide-23 06/11/2008   Td 09/29/2005   Tdap 10/15/2017    Diabetes He presents for his follow-up diabetic visit. He has type 2 diabetes mellitus. Onset time: was diagnosed at approx age of 50. His disease course has been improving. There are no hypoglycemic associated symptoms. Associated symptoms  include blurred vision, foot paresthesias and weight loss. Pertinent negatives for diabetes include no chest pain, no fatigue, no polydipsia, no polyphagia and no polyuria. There are no hypoglycemic complications. Symptoms are stable. Diabetic complications include heart disease, impotence, nephropathy, peripheral neuropathy and retinopathy. 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 Current diabetic treatment includes intensive insulin program. He is compliant with treatment most of the time. His weight is decreasing steadily. He is following a generally healthy diet. When asked about meal planning, he reported none. He has not had a previous visit with a dietitian. He participates in exercise three times a week. His home blood glucose trend is decreasing steadily. His breakfast blood glucose range is generally 130-140 mg/dl. His lunch blood glucose range is generally 110-130 mg/dl. His dinner blood glucose range is generally 110-130 mg/dl. His bedtime blood glucose range is generally 140-180 mg/dl. (He presents today with his meter and logs showing improved glycemic profile with near target fasting and postprandial readings.  His POCT A1c today is 7%, improved from last visit of 9.7%.  He denies any significant episodes of hypoglycemia.) An ACE inhibitor/angiotensin II receptor blocker is being taken. He sees a podiatrist.Eye exam is current.  Hyperlipidemia This is a chronic problem. The current episode started more than 1 year ago. The problem is controlled. Recent lipid tests were reviewed and are normal. Exacerbating diseases include chronic renal disease and diabetes. There are no known factors aggravating his hyperlipidemia. Pertinent negatives include no chest pain. Current antihyperlipidemic treatment includes statins. The current treatment provides moderate improvement of  lipids. There are no compliance problems.  Risk factors for coronary artery disease include diabetes mellitus, dyslipidemia, hypertension and male sex.  Hypertension This is a chronic problem. The current episode started more than 1 year ago. The problem has been gradually improving since onset. The problem is controlled. Associated symptoms include blurred vision. Pertinent negatives include no chest pain. There are no associated agents to hypertension. Risk factors for coronary artery disease include diabetes mellitus, dyslipidemia and male gender. Past treatments include angiotensin blockers. The current treatment provides moderate improvement. There are no compliance problems.  Hypertensive end-organ damage includes kidney disease, heart failure and retinopathy. Identifiable causes of hypertension include chronic renal disease.    Review of systems  Constitutional: + Minimally fluctuating body weight,  current Body mass index is 30.01 kg/m. , no fatigue, no subjective hyperthermia, no subjective hypothermia Eyes: + blurry vision, no xerophthalmia ENT: no sore throat, no nodules palpated in throat, no dysphagia/odynophagia, no hoarseness Cardiovascular: no chest pain, no shortness of breath, no palpitations, no leg swelling Respiratory: no cough, no shortness of breath Gastrointestinal: no nausea/vomiting/diarrhea Musculoskeletal: no muscle/joint aches Skin: no rashes, no hyperemia, reports bruise to left great toe without known injury Neurological: no tremors, + numbness/tingling to BLE, no dizziness Psychiatric: no depression, no anxiety  Objective:     BP 124/75   Pulse 60   Ht $R'5\' 11"'Gh$  (1.803 m)   Wt 215 lb 3.2 oz (97.6 kg)   BMI 30.01 kg/m   Wt Readings from Last 3 Encounters:  08/06/20 215 lb 3.2 oz (97.6 kg)  05/28/20 219 lb (99.3 kg)  05/14/20 225 lb (102.1 kg)     BP Readings from Last 3 Encounters:  08/06/20 124/75  05/28/20 137/78  05/14/20 (!) 144/69       Physical Exam- Limited  Constitutional:  Body mass index is 30.01 kg/m. , not in acute distress, normal state of mind Eyes:  EOMI, no exophthalmos Neck: Supple Cardiovascular: RRR, no murmurs, rubs, or gallops, no edema Respiratory: Adequate breathing efforts, no crackles, rales, rhonchi, or wheezing Musculoskeletal: no gross deformities, strength intact in all four extremities, no gross restriction of joint movements Skin:  no rashes, no hyperemia, he does have area  of discoloration to the distal end of his left great toe, appears to be a fluid-filled blister Neurological: no tremor with outstretched hands    CMP ( most recent) CMP     Component Value Date/Time   NA 140 05/07/2020 0816   K 4.4 05/07/2020 0816   CL 109 05/07/2020 0816   CO2 22 05/07/2020 0816   GLUCOSE 99 05/07/2020 0816   BUN 19 05/07/2020 0816   CREATININE 1.03 05/07/2020 0816   CALCIUM 9.5 05/07/2020 0816   PROT 7.4 05/07/2020 0816   ALBUMIN 3.8 10/15/2017 0818   AST 84 (H) 05/07/2020 0816   ALT 53 (H) 05/07/2020 0816   ALKPHOS 94 10/15/2017 0818   BILITOT 0.5 05/07/2020 0816   GFRNONAA 76 05/07/2020 0816   GFRAA 89 05/07/2020 0816     Diabetic Labs (most recent): Lab Results  Component Value Date   HGBA1C 7.0 08/06/2020   HGBA1C 9.7 (A) 04/09/2020   HGBA1C 6.7 (A) 12/19/2019     Lipid Panel ( most recent) Lipid Panel     Component Value Date/Time   CHOL 127 05/07/2020 0816   TRIG 71 05/07/2020 0816   HDL 47 05/07/2020 0816   CHOLHDL 2.7 05/07/2020 0816   VLDL 33.0 10/15/2017 0818   LDLCALC 65 05/07/2020 0816   LDLDIRECT 120.2 04/04/2013 0918      Lab Results  Component Value Date   TSH 1.10 10/15/2017   TSH 1.63 04/28/2016   TSH 0.99 05/21/2014   TSH 1.43 04/04/2013   TSH 1.28 11/13/2011   TSH 0.77 03/29/2010   TSH 0.95 09/01/2009   TSH 0.71 02/08/2009   TSH 0.75 02/07/2008           Assessment & Plan:   1) Uncontrolled Type 2 Diabetes with hyperglycemia  He  presents today with his meter and logs showing improved glycemic profile with near target fasting and postprandial readings.  His POCT A1c today is 7%, improved from last visit of 9.7%.  He denies any significant episodes of hypoglycemia.  - Damario K Proch has currently uncontrolled symptomatic type 2 DM since 65 years of age.  -Recent labs reviewed.  - I had a long discussion with him about the progressive nature of diabetes and the pathology behind its complications. -his diabetes is complicated by retinopathy, CKD, neuropathy, recent TMA and he remains at a high risk for more acute and chronic complications which include CAD, CVA, CKD, retinopathy, and neuropathy. These are all discussed in detail with him.  - Nutritional counseling repeated at each appointment due to patients tendency to fall back in to old habits.  - The patient admits there is a room for improvement in their diet and drink choices. -  Suggestion is made for the patient to avoid simple carbohydrates from their diet including Cakes, Sweet Desserts / Pastries, Ice Cream, Soda (diet and regular), Sweet Tea, Candies, Chips, Cookies, Sweet Pastries, Store Bought Juices, Alcohol in Excess of 1-2 drinks a day, Artificial Sweeteners, Coffee Creamer, and "Sugar-free" Products. This will help patient to have stable blood glucose profile and potentially avoid unintended weight gain.   - I encouraged the patient to switch to unprocessed or minimally processed complex starch and increased protein intake (animal or plant source), fruits, and vegetables.   - Patient is advised to stick to a routine mealtimes to eat 3 meals a day and avoid unnecessary snacks (to snack only to correct hypoglycemia).  - I have approached him with the following individualized plan to manage  his diabetes and patient agrees:   -He has responded well with switching to premixed insulin and is advised to continue his current dose of 70/30 at 10 units twice  daily with meals if glucose above 90 and he is eating.    -he is encouraged to continue monitoring blood glucose 3 times daily, before injecting insulin (at breakfast and supper) and before bed, and to call the clinic if he has readings less than 70 or greater than 300 for 3 tests in a row.   - he is warned not to take insulin without proper monitoring per orders. - Adjustment parameters are given to him for hypo and hyperglycemia in writing.  - Specific targets for  A1c;  LDL, HDL,  and Triglycerides were discussed with the patient.  2) Blood Pressure /Hypertension:  his blood pressure is controlled to target.   he is advised to continue his current medications including Cozaar 50 mg p.o. daily with breakfast.  3) Lipids/Hyperlipidemia:    Review of his recent lipid panel from 05/07/20 showed controlled  LDL at 65 .  he  is advised to continue Lipitor 10 mg daily at bedtime.  Side effects and precautions discussed with him.  4)  Weight/Diet:  his Body mass index is 30.01 kg/m.  -   clearly complicating his diabetes care.   he is a candidate for weight loss. I discussed with him the fact that loss of 5 - 10% of his  current body weight will have the most impact on his diabetes management.  Exercise, and detailed carbohydrates information provided  -  detailed on discharge instructions.  5) Chronic Care/Health Maintenance: -he is on ACEI/ARB and Statin medications and is encouraged to initiate and continue to follow up with Ophthalmology, Dentist, Podiatrist at least yearly or according to recommendations, and advised to stay away from smoking. I have recommended yearly flu vaccine and pneumonia vaccine at least every 5 years; moderate intensity exercise for up to 150 minutes weekly; and sleep for at least 7 hours a day.  - he is advised to maintain close follow up with [name], MD for primary care needs, as well as his other providers for optimal and coordinated care.     I spent  35 minutes in the care of the patient today including review of labs from [place], Lipids, Thyroid Function, Hematology (current and previous including abstractions from other facilities); face-to-face time discussing  his blood glucose readings/logs, discussing hypoglycemia and hyperglycemia episodes and symptoms, medications doses, his options of short and long term treatment based on the latest standards of care / guidelines;  discussion about incorporating lifestyle medicine;  and documenting the encounter.    Please refer to Patient Instructions for Blood Glucose Monitoring and Insulin/Medications Dosing Guide"  in media tab for additional information. Please  also refer to " Patient Self Inventory" in the Media  tab for reviewed elements of pertinent patient history.  [place] participated in the discussions, expressed understanding, and voiced agreement with the above plans.  All questions were answered to his satisfaction. he is encouraged to contact clinic should he have any questions or concerns prior to his return visit.   Follow up plan: - Return in about 4 months (around 12/06/2020) for Diabetes F/U with A1c in office, Previsit labs, Bring meter and logs.  [name], [facility] [address] [place], [place] [address] Phone: [phone] Fax: [phone]  08/06/2020, 9:27 AM

## 2020-08-09 ENCOUNTER — Other Ambulatory Visit: Payer: Self-pay | Admitting: Family Medicine

## 2020-08-09 ENCOUNTER — Telehealth: Payer: Self-pay | Admitting: *Deleted

## 2020-08-09 NOTE — Telephone Encounter (Signed)
Returned call to patient,he is not having any pain,no drainage or redness noted , looks like he may have stumped the tip of the toe(black),first noticed about 1 week ago.does have a sooner upcoming appointment 08/13/20.

## 2020-08-09 NOTE — Telephone Encounter (Signed)
Patient has developed a blister on one of his toes, requesting an upcoming sooner appointment. Please schedule.

## 2020-08-09 NOTE — Telephone Encounter (Signed)
Patient calling back for Aaron Fox, transferred to nurse line.

## 2020-08-09 NOTE — Telephone Encounter (Signed)
Returned call back to patient , had some questions , no answer, left vmessage for call back.

## 2020-08-11 NOTE — Telephone Encounter (Signed)
Returned call back to patient, no answer,left vmessage for a call back.

## 2020-08-13 ENCOUNTER — Ambulatory Visit: Payer: Medicare HMO | Admitting: Podiatry

## 2020-08-13 ENCOUNTER — Other Ambulatory Visit: Payer: Self-pay

## 2020-08-13 ENCOUNTER — Ambulatory Visit (INDEPENDENT_AMBULATORY_CARE_PROVIDER_SITE_OTHER): Payer: Medicare HMO

## 2020-08-13 DIAGNOSIS — M79675 Pain in left toe(s): Secondary | ICD-10-CM | POA: Diagnosis not present

## 2020-08-13 DIAGNOSIS — L84 Corns and callosities: Secondary | ICD-10-CM

## 2020-08-13 DIAGNOSIS — E1149 Type 2 diabetes mellitus with other diabetic neurological complication: Secondary | ICD-10-CM | POA: Diagnosis not present

## 2020-08-13 DIAGNOSIS — Z89439 Acquired absence of unspecified foot: Secondary | ICD-10-CM

## 2020-08-13 DIAGNOSIS — B351 Tinea unguium: Secondary | ICD-10-CM | POA: Diagnosis not present

## 2020-08-13 DIAGNOSIS — R21 Rash and other nonspecific skin eruption: Secondary | ICD-10-CM

## 2020-08-13 DIAGNOSIS — S90112A Contusion of left great toe without damage to nail, initial encounter: Secondary | ICD-10-CM

## 2020-08-13 NOTE — Progress Notes (Signed)
Subjective: 65 year old male presents the office today for concerns of bruising to the tip of his left big toe.  He states that he was walking a mirror stopped his toe on a concrete ledge he does not remember hitting the toe that hard.  Has not seen any drainage or swelling or any redness.  He said no recent treatment.  Also asking the nails be trimmed in the left foot. Denies any systemic complaints such as fevers, chills, nausea, vomiting. No acute changes since last appointment, and no other complaints at this time.   Objective: AAO x3, NAD DP/PT pulses palpable bilaterally, CRT less than 3 seconds On the left hallux there is what appears to be an old hemorrhagic blister which is firmed up and there is no drainage identified there is no fluctuation.  There is no edema, erythema to the toe.  No pain to the toe. Nails on the left foot are hypertrophic, dystrophic with brown discoloration x5 which are causing discomfort inside shoes.  No redness or drainage of the toenail sites. Red scaly rash present on the anterior aspect of the leg which appears to be psoriasis.  There is no drainage or pus or any swelling or redness. No pain with calf compression, swelling, warmth, erythema  Assessment: Left hallux contusion, symptomatic onychomycosis left side; right leg skin rash  Plan: -All treatment options discussed with the patient including all alternatives, risks, complications.  -X-rays obtained reviewed.  Hammertoes are present with x-ray changes present.  No evidence of acute fracture, osteomyelitis identified.  Soft tissue deficit on the lateral view of the distal hallux which does correspond to the area of the dried blister. -There is no significant tissue to debride today.  Dispensed offloading pads.  Small mount of moisturizer daily.  Monitor for any skin breakdown or drainage or signs of infection. -Nails sharply debrided x5 without any complications or bleeding -Recommend steroid cream in the  right leg skin rash.  He has this at home.  If no improvement consider biopsy. -Patient encouraged to call the office with any questions, concerns, change in symptoms.   Trula Slade DPM

## 2020-08-16 ENCOUNTER — Ambulatory Visit: Payer: Medicare HMO | Admitting: Podiatry

## 2020-08-19 ENCOUNTER — Ambulatory Visit: Payer: Medicare HMO | Admitting: Podiatry

## 2020-08-19 ENCOUNTER — Other Ambulatory Visit: Payer: Self-pay

## 2020-08-19 DIAGNOSIS — E1149 Type 2 diabetes mellitus with other diabetic neurological complication: Secondary | ICD-10-CM

## 2020-08-19 DIAGNOSIS — L84 Corns and callosities: Secondary | ICD-10-CM

## 2020-08-19 DIAGNOSIS — R21 Rash and other nonspecific skin eruption: Secondary | ICD-10-CM | POA: Diagnosis not present

## 2020-08-19 NOTE — Progress Notes (Signed)
Subjective: 65 year old male presents the office today for follow-up evaluation of blister, callus to the tip of his left big toe.  He states that is getting better.  Denies any drainage or pus or any swelling or redness.  Also has been applying the steroid cream to the rash in the right leg and this is getting better. Denies any systemic complaints such as fevers, chills, nausea, vomiting. No acute changes since last appointment, and no other complaints at this time.   Objective: AAO x3, NAD DP/PT pulses palpable bilaterally, CRT less than 3 seconds Hyperkeratotic tissue the distal aspect the left hallux from old blister.  Upon debridement there is no underlying ulceration drainage or signs of infection.  On the left side the scaly erythematous rash is much improved.  There is no drainage or pus. No pain with calf compression, swelling, warmth, erythema     Assessment: Preulcerative area left hallux, skin rash right side  Plan: -All treatment options discussed with the patient including all alternatives, risks, complications.  -Sharply debrided some of the hyperkeratotic tissue and left hallux with any complications or bleeding.  Recommend moisturizer and offloading daily. -Continue steroid cream right side. -Daily foot inspection. -Patient encouraged to call the office with any questions, concerns, change in symptoms.   Return in about 3 weeks (around 09/09/2020).  Trula Slade DPM

## 2020-08-27 ENCOUNTER — Ambulatory Visit (INDEPENDENT_AMBULATORY_CARE_PROVIDER_SITE_OTHER): Payer: Medicare HMO

## 2020-08-27 DIAGNOSIS — Z0000 Encounter for general adult medical examination without abnormal findings: Secondary | ICD-10-CM | POA: Diagnosis not present

## 2020-08-27 NOTE — Patient Instructions (Signed)
Aaron Fox , Thank you for taking time to come for your Medicare Wellness Visit. I appreciate your ongoing commitment to your health goals. Please review the following plan we discussed and let me know if I can assist you in the future.   Screening recommendations/referrals: Colonoscopy: Up to date, next due 11/13/2023 Recommended yearly ophthalmology/optometry visit for glaucoma screening and checkup Recommended yearly dental visit for hygiene and checkup  Vaccinations: Influenza vaccine: Up to date, next due fall 2022 Pneumococcal vaccine: Currently due, you may receive at your next in person office visit  Tdap vaccine: Up to date, next due 10/16/2027 Shingles vaccine: Completed series     Advanced directives: Advance directive discussed with you today. Even though you declined this today please call our office should you change your mind and we can give you the proper paperwork for you to fill out.   Conditions/risks identified: None   Next appointment: 09/13/2020 @ 12:30 with Gerald Stabs Pharmacist at [facility] 65 Years and Older, Male Preventive care refers to lifestyle choices and visits with your health care provider that can promote health and wellness. What does preventive care include? A yearly physical exam. This is also called an annual well check. Dental exams once or twice a year. Routine eye exams. Ask your health care provider how often you should have your eyes checked. Personal lifestyle choices, including: Daily care of your teeth and gums. Regular physical activity. Eating a healthy diet. Avoiding tobacco and drug use. Limiting alcohol use. Practicing safe sex. Taking low doses of aspirin every day. Taking vitamin and mineral supplements as recommended by your health care provider. What happens during an annual well check? The services and screenings done by your health care provider during your annual well check will depend  on your age, overall health, lifestyle risk factors, and family history of disease. Counseling  Your health care provider may ask you questions about your: Alcohol use. Tobacco use. Drug use. Emotional well-being. Home and relationship well-being. Sexual activity. Eating habits. History of falls. Memory and ability to understand (cognition). Work and work Statistician. Screening  You may have the following tests or measurements: Height, weight, and BMI. Blood pressure. Lipid and cholesterol levels. These may be checked every 5 years, or more frequently if you are over 37 years old. Skin check. Lung cancer screening. You may have this screening every year starting at age 52 if you have a 30-pack-year history of smoking and currently smoke or have quit within the past 15 years. Fecal occult blood test (FOBT) of the stool. You may have this test every year starting at age 26. Flexible sigmoidoscopy or colonoscopy. You may have a sigmoidoscopy every 5 years or a colonoscopy every 10 years starting at age 5. Prostate cancer screening. Recommendations will vary depending on your family history and other risks. Hepatitis C blood test. Hepatitis B blood test. Sexually transmitted disease (STD) testing. Diabetes screening. This is done by checking your blood sugar (glucose) after you have not eaten for a while (fasting). You may have this done every 1-3 years. Abdominal aortic aneurysm (AAA) screening. You may need this if you are a current or former smoker. Osteoporosis. You may be screened starting at age 3 if you are at high risk. Talk with your health care provider about your test results, treatment options, and if necessary, the need for more tests. Vaccines  Your health care provider may recommend certain vaccines, such as: Influenza vaccine. This is  recommended every year. Tetanus, diphtheria, and acellular pertussis (Tdap, Td) vaccine. You may need a Td booster every 10  years. Zoster vaccine. You may need this after age 20. Pneumococcal 13-valent conjugate (PCV13) vaccine. One dose is recommended after age 19. Pneumococcal polysaccharide (PPSV23) vaccine. One dose is recommended after age 39. Talk to your health care provider about which screenings and vaccines you need and how often you need them. This information is not intended to replace advice given to you by your health care provider. Make sure you discuss any questions you have with your health care provider. Document Released: 03/12/2015 Document Revised: 11/03/2015 Document Reviewed: 12/15/2014 Elsevier Interactive Patient Education  2017 [employer] Prevention in the Home Falls can cause injuries. They can happen to people of all ages. There are many things you can do to make your home safe and to help prevent falls. What can I do on the outside of my home? Regularly fix the edges of walkways and driveways and fix any cracks. Remove anything that might make you trip as you walk through a door, such as a raised step or threshold. Trim any bushes or trees on the path to your home. Use bright outdoor lighting. Clear any walking paths of anything that might make someone trip, such as rocks or tools. Regularly check to see if handrails are loose or broken. Make sure that both sides of any steps have handrails. Any raised decks and porches should have guardrails on the edges. Have any leaves, snow, or ice cleared regularly. Use sand or salt on walking paths during winter. Clean up any spills in your garage right away. This includes oil or grease spills. What can I do in the bathroom? Use night lights. Install grab bars by the toilet and in the tub and shower. Do not use towel bars as grab bars. Use non-skid mats or decals in the tub or shower. If you need to sit down in the shower, use a plastic, non-slip stool. Keep the floor dry. Clean up any water that spills on the floor as soon as it  happens. Remove soap buildup in the tub or shower regularly. Attach bath mats securely with double-sided non-slip rug tape. Do not have throw rugs and other things on the floor that can make you trip. What can I do in the bedroom? Use night lights. Make sure that you have a light by your bed that is easy to reach. Do not use any sheets or blankets that are too big for your bed. They should not hang down onto the floor. Have a firm chair that has side arms. You can use this for support while you get dressed. Do not have throw rugs and other things on the floor that can make you trip. What can I do in the kitchen? Clean up any spills right away. Avoid walking on wet floors. Keep items that you use a lot in easy-to-reach places. If you need to reach something above you, use a strong step stool that has a grab bar. Keep electrical cords out of the way. Do not use floor polish or wax that makes floors slippery. If you must use wax, use non-skid floor wax. Do not have throw rugs and other things on the floor that can make you trip. What can I do with my stairs? Do not leave any items on the stairs. Make sure that there are handrails on both sides of the stairs and use them. Fix handrails that are  broken or loose. Make sure that handrails are as long as the stairways. Check any carpeting to make sure that it is firmly attached to the stairs. Fix any carpet that is loose or worn. Avoid having throw rugs at the top or bottom of the stairs. If you do have throw rugs, attach them to the floor with carpet tape. Make sure that you have a light switch at the top of the stairs and the bottom of the stairs. If you do not have them, ask someone to add them for you. What else can I do to help prevent falls? Wear shoes that: Do not have high heels. Have rubber bottoms. Are comfortable and fit you well. Are closed at the toe. Do not wear sandals. If you use a stepladder: Make sure that it is fully opened.  Do not climb a closed stepladder. Make sure that both sides of the stepladder are locked into place. Ask someone to hold it for you, if possible. Clearly mark and make sure that you can see: Any grab bars or handrails. First and last steps. Where the edge of each step is. Use tools that help you move around (mobility aids) if they are needed. These include: Canes. Walkers. Scooters. Crutches. Turn on the lights when you go into a dark area. Replace any light bulbs as soon as they burn out. Set up your furniture so you have a clear path. Avoid moving your furniture around. If any of your floors are uneven, fix them. If there are any pets around you, be aware of where they are. Review your medicines with your doctor. Some medicines can make you feel dizzy. This can increase your chance of falling. Ask your doctor what other things that you can do to help prevent falls. This information is not intended to replace advice given to you by your health care provider. Make sure you discuss any questions you have with your health care provider. Document Released: 12/10/2008 Document Revised: 07/22/2015 Document Reviewed: 03/20/2014 Elsevier Interactive Patient Education  2017 [employer].

## 2020-08-27 NOTE — Progress Notes (Signed)
Subjective:   Aaron Fox is a 65 y.o. male who presents for an Initial Medicare Annual Wellness Visit.  I connected with Kerrin Mo today by telephone and verified that I am speaking with the correct person using two identifiers. Location patient: home Location provider: work Persons participating in the virtual visit: patient, provider.   I discussed the limitations, risks, security and privacy concerns of performing an evaluation and management service by telephone and the availability of in person appointments. I also discussed with the patient that there may be a patient responsible charge related to this service. The patient expressed understanding and verbally consented to this telephonic visit.    Interactive audio and video telecommunications were attempted between this provider and patient, however failed, due to patient having technical difficulties OR patient did not have access to video capability.  We continued and completed visit with audio only.     Review of Systems    N/A  Cardiac Risk Factors include: advanced age (>20mn, >>34women);male gender;diabetes mellitus;dyslipidemia;hypertension     Objective:    Today's Vitals   There is no height or weight on file to calculate BMI.  Advanced Directives 08/27/2020 04/25/2019 04/25/2019 04/24/2019 02/24/2016 06/03/2015 05/20/2015  Does Patient Have a Medical Advance Directive? No No No No No No No  Would patient like information on creating a medical advance directive? No - Patient declined No - Patient declined No - Patient declined No - Patient declined No - Patient declined No - patient declined information -    Current Medications (verified) Outpatient Encounter Medications as of 08/27/2020  Medication Sig   ACCU-CHEK GUIDE test strip TEST BLOOD SUGAR 7 TIMES DAILY   Accu-Chek Softclix Lancets lancets TEST  7  TIMES  DAILY   Alcohol Swabs (B-D SINGLE USE SWABS REGULAR) PADS Use to cleanse site prior to use  of lancet to obtain droplet of blood two times per day; E10.49   aspirin 81 MG tablet Take 81 mg by mouth daily.   atorvastatin (LIPITOR) 10 MG tablet TAKE 1 TABLET EVERY DAY   Blood Glucose Monitoring Suppl (ACCU-CHEK GUIDE ME) w/Device KIT 1 each by Does not apply route See admin instructions. Use to monitor glucose levels 7 times daily; E11.9; WILL NOT COMPLETE PA   DROPLET INSULIN SYRINGE 29G X 1/2" 1 ML MISC USE IN THE MORNING AND AT BEDTIME   gabapentin (NEURONTIN) 300 MG capsule TAKE 2 CAPSULES THREE TIMES DAILY   insulin isophane & regular human (NOVOLIN 70/30 FLEXPEN) (70-30) 100 UNIT/ML KwikPen Inject 10 Units into the skin 2 (two) times daily with a meal. With breakfast and supper if glucose is above 90 AND you are eating.   losartan (COZAAR) 50 MG tablet TAKE 1 TABLET EVERY DAY (Patient taking differently: 100 mg.)   Tetrahydrozoline HCl (VISINE OP) Place 1 drop into both eyes daily as needed (redness).   triamcinolone (KENALOG) 0.1 % APPLY TO THE AFFECTED AREA(S) TOPICALLY THREE TIMES DAILY AS NEEDED FOR RASH   No facility-administered encounter medications on file as of 08/27/2020.    Allergies (verified) Penicillins, Ciprofloxacin, Terbinafine hcl, and Zestril [lisinopril]   History: Past Medical History:  Diagnosis Date   ALLERGIC RHINITIS 07/02/2008   Allergy    Arthritis    RA   Atrial fibrillation (HCC)    Cataract    removed both eyes    CHF 06/19/2008   COUGH DUE TO ACE INHIBITORS 10/05/2008   DIABETES MELLITUS, TYPE II 09/22/2006   Diabetic retinopathy  of both eyes ([place])    mild nonproliferative   ED (erectile dysfunction)    Glaucoma    GOUT 09/22/2006   Hx of adenomatous colonic polyps 06/09/2015   HYPERCHOLESTEROLEMIA 07/02/2008   HYPERTENSION 05/31/2009   HYPOGONADISM, MALE 01/15/2007   Hypokalemia    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 Talbert Forest   CATARACT EXTRACTION, BILATERAL     COLONOSCOPY  2004   LYMPH NODE BIOPSY Right 10/16/2014   Procedure: RIGHT INGUINAL LYMPH NODE BIOPSY;  Surgeon: Aviva Signs, MD;  Location: [facility];  Service: General;  Laterality: Right;   Family History  Problem Relation Age of Onset   Heart disease Other        FH of CAD   Colon polyps Paternal Uncle    Kidney disease Paternal Uncle    Diabetes Paternal Grandfather    Diabetes Maternal Uncle    Cancer Neg Hx    Colon cancer Neg Hx    Gallbladder disease Neg Hx    Esophageal cancer Neg Hx    Rectal cancer Neg Hx    Stomach cancer Neg Hx    Social History   Socioeconomic History   Marital status: Married    Spouse name: Not on file   Number of children: 3   Years of education: Not on file   Highest education level: Not on file  Occupational History   Occupation: Disability    Employer: [employer]  Tobacco Use   Smoking status: Former    Pack years: 0.00    Types: Cigarettes    Quit date: 11/02/1999    Years since quitting: 20.8   Smokeless tobacco: Never  Substance and Sexual Activity   Alcohol use: No    Alcohol/week: 0.0 standard drinks   Drug use: No   Sexual activity: Not on file  Other Topics Concern   Not on file  Social History Narrative   ** Merged History Encounter **       Married, 3 kids Prior work [employer] before disability Regular exercise-yes   Social Determinants of Radio broadcast assistant Strain: Low Risk    Difficulty of Paying Living Expenses: Not hard at all  Food Insecurity:  No Food Insecurity   Worried About Charity fundraiser in the Last Year: Never true   Arboriculturist in the Last Year: Never true  Transportation Needs: No Transportation Needs   Lack of Transportation (Medical): No   Lack of Transportation (Non-Medical): No  Physical Activity: Sufficiently Active   Days of Exercise per Week: 7 days   Minutes of Exercise per Session: 30 min  Stress: No Stress Concern Present   Feeling of Stress : Not at all  Social Connections: Moderately Integrated   Frequency of Communication with Friends and Family: More than three times a week   Frequency of Social Gatherings with Friends and Family: Three times a week   Attends Religious Services: More than  4 times per year   Active Member of Clubs or Organizations: No   Attends Archivist Meetings: Never   Marital Status: Married    Tobacco Counseling Counseling given: Not Answered   Clinical Intake:  Pre-visit preparation completed: Yes  Pain : No/denies pain     Nutritional Risks: None Diabetes: Yes CBG done?: No Did pt. bring in CBG monitor from home?: No  How often do you need to have someone help you when you read instructions, pamphlets, or other written materials from your doctor or pharmacy?: 1 - Never  Diabetic?Yes Nutrition Risk Assessment:  Has the patient had any N/V/D within the last 2 months?  No  Does the patient have any non-healing wounds?  No  Has the patient had any unintentional weight loss or weight gain?  No   Diabetes:  Is the patient diabetic?  Yes  If diabetic, was a CBG obtained today?  No  Did the patient bring in their glucometer from home?  No  How often do you monitor your CBG's? Patient states checks glucose 4x per day.   Financial Strains and Diabetes Management:  Are you having any financial strains with the device, your supplies or your medication? No .  Does the patient want to be seen by Chronic Care Management for management of their  diabetes?  No  Would the patient like to be referred to a Nutritionist or for Diabetic Management?  No   Diabetic Exams:  Diabetic Eye Exam: Overdue for diabetic eye exam. Pt has been advised about the importance in completing this exam. Patient advised to call and schedule an eye exam. Diabetic Foot Exam: Completed 04/09/2020   Interpreter Needed?: No  Information entered by :: [employer] of Daily Living In your present state of health, do you have any difficulty performing the following activities: 08/27/2020  Hearing? N  Vision? N  Difficulty concentrating or making decisions? N  Walking or climbing stairs? N  Dressing or bathing? N  Doing errands, shopping? N  Preparing Food and eating ? N  Using the Toilet? N  In the past six months, have you accidently leaked urine? N  Do you have problems with loss of bowel control? N  Managing your Medications? N  Managing your Finances? N  Housekeeping or managing your Housekeeping? N  Some recent data might be hidden    Patient Care Team: [name], MD as PCP - General (Family Medicine) Edythe Clarity, [facility] as Pharmacist (Pharmacist)  Indicate any recent Medical Services you may have received from other than [facility] providers in the past year (date may be approximate).     Assessment:   This is a routine wellness examination for Ardis.  Hearing/Vision screen Vision Screening - Comments:: Patient states gets eyes examined once per year.   Dietary issues and exercise activities discussed: Current Exercise Habits: Home exercise routine, Type of exercise: walking;strength training/weights, Time (Minutes): 30, Frequency (Times/Week): 7, Weekly Exercise (Minutes/Week): 210, Intensity: Mild, Exercise limited by: None identified   Goals Addressed             This Visit's Progress    Exercise 150 min/wk Moderate Activity       HEMOGLOBIN A1C < 7       Weight (lb) < 200 lb (90.7 kg)         Depression  Screen PHQ 2/9 Scores 08/27/2020 05/07/2020  PHQ - 2 Score 0 0    Fall Risk Fall Risk  08/27/2020 05/07/2020  Falls in the past year? 0 0  Number falls in past yr: 0 -  Injury with Fall? 0 -  Risk for fall due to : No Fall Risks No Fall Risks  Follow up Falls evaluation completed;Falls prevention discussed Falls evaluation completed    FALL RISK PREVENTION PERTAINING TO THE HOME:  Any stairs in or around the home? No  If so, are there any without handrails? No  Home free of loose throw rugs in walkways, pet beds, electrical cords, etc? Yes  Adequate lighting in your home to reduce risk of falls? Yes   ASSISTIVE DEVICES UTILIZED TO PREVENT FALLS:  Life alert? No  Use of a cane, walker or w/c? No  Grab bars in the bathroom? Yes  Shower chair or bench in shower? Yes  Elevated toilet seat or a handicapped toilet? Yes     Cognitive Function:  Normal cognitive status assessed by direct observation by this Nurse Health Advisor. No abnormalities found.        Immunizations Immunization History  Administered Date(s) Administered   Influenza Whole 12/28/2008   Influenza,inj,Quad PF,6+ Mos 11/21/2013, 04/01/2015, 12/20/2015, 01/08/2017, 12/17/2017, 10/25/2018, 12/19/2019   Moderna Sars-Covid-2 Vaccination 04/29/2019, 05/27/2019, 12/29/2019   Pneumococcal Polysaccharide-23 06/11/2008   Td 09/29/2005   Tdap 10/15/2017   Zoster Recombinat (Shingrix) 06/14/2020, 08/25/2020    TDAP status: Up to date  Flu Vaccine status: Up to date  Pneumococcal vaccine status: Due, Education has been provided regarding the importance of this vaccine. Advised may receive this vaccine at local pharmacy or Health Dept. Aware to provide a copy of the vaccination record if obtained from local pharmacy or Health Dept. Verbalized acceptance and understanding.  Covid-19 vaccine status: Completed vaccines  Qualifies for Shingles Vaccine? Yes   Zostavax completed No   Shingrix Completed?: Yes  Screening  Tests Health Maintenance  Topic Date Due   Pneumococcal Vaccine 80-31 Years old (1 - PCV) Never done   OPHTHALMOLOGY EXAM  03/20/2020   COVID-19 Vaccine (4 - Booster for Moderna series) 03/30/2020   INFLUENZA VACCINE  09/27/2020   HEMOGLOBIN A1C  02/05/2021   FOOT EXAM  04/09/2021   COLONOSCOPY (Pts 45-63yr Insurance coverage will need to be confirmed)  11/13/2023   TETANUS/TDAP  10/16/2027   Hepatitis C Screening  Completed   HIV Screening  Completed   Zoster Vaccines- Shingrix  Completed   HPV VACCINES  Aged Out    Health Maintenance  Health Maintenance Due  Topic Date Due   Pneumococcal Vaccine 044636Years old (1 - PCV) Never done   OPHTHALMOLOGY EXAM  03/20/2020   COVID-19 Vaccine (4 - Booster for Moderna series) 03/30/2020    Colorectal cancer screening: Type of screening: Colonoscopy. Completed 11/13/2018. Repeat every 5 years  Lung Cancer Screening: (Low Dose CT Chest recommended if Age 64-80years, 30 pack-year currently smoking OR have quit w/in 15years.) does qualify.   Lung Cancer Screening Referral: N/A   Additional Screening:  Hepatitis C Screening: does qualify; Completed 05/11/2020  Vision Screening: Recommended annual ophthalmology exams for early detection of glaucoma and other disorders of the eye. Is the patient up to date with their annual eye exam?  Yes  Who is the provider or what is the name of the office in which the patient attends annual eye exams? Eye doctor on [address] If pt is not established with a provider, would they like to be referred to a provider to establish care? No .   Dental  Screening: Recommended annual dental exams for proper oral hygiene  Community Resource Referral / Chronic Care Management: CRR required this visit?  No   CCM required this visit?  No      Plan:     I have personally reviewed and noted the following in the patient's chart:   Medical and social history Use of alcohol, tobacco or illicit drugs   Current medications and supplements including opioid prescriptions. Patient is not currently taking opioid prescriptions. Functional ability and status Nutritional status Physical activity Advanced directives List of other physicians Hospitalizations, surgeries, and ER visits in previous 12 months Vitals Screenings to include cognitive, depression, and falls Referrals and appointments  In addition, I have reviewed and discussed with patient certain preventive protocols, quality metrics, and best practice recommendations. A written personalized care plan for preventive services as well as general preventive health recommendations were provided to patient.     Ofilia Neas, LPN   03/05/5535   Nurse Notes: None

## 2020-09-09 ENCOUNTER — Ambulatory Visit: Payer: Medicare HMO | Admitting: Podiatry

## 2020-09-09 ENCOUNTER — Other Ambulatory Visit: Payer: Self-pay

## 2020-09-09 DIAGNOSIS — L84 Corns and callosities: Secondary | ICD-10-CM

## 2020-09-09 DIAGNOSIS — E1149 Type 2 diabetes mellitus with other diabetic neurological complication: Secondary | ICD-10-CM

## 2020-09-09 NOTE — Progress Notes (Signed)
Subjective: 65 year old male presents the office today for follow-up evaluation of blister, callus to the tip of his left big toe.  He states the callus is formed and the scab discharge.  Denies any opening to the toe or any new ulcerations or calluses.  Denies any drainage or pus or any swelling.  His last blood sugar check this morning was 152.  He states this is normally lower but he ate late last night.  Denies any fevers, chills, nausea, vomiting.  Objective: AAO x3, NAD DP/PT pulses palpable bilaterally, CRT less than 3 seconds Hyperkeratotic tissue the distal aspect the left hallux from old blister.  After debridement the underlying skin was intact.  There is no drainage or pus.  No ulcerations.  No edema, erythema.  No fluctuation crepitation.  There is no malodor.   No pain with calf compression, swelling, warmth, erythema  Assessment: Preulcerative area left hallux, skin rash right side  Plan: -All treatment options discussed with the patient including all alternatives, risks, complications.  -Sharply debrided the hyperkeratotic lesion without any complications or bleeding.  Continue offloading.  Monitor for any skin breakdown.  Discussed daily foot inspection as well as glucose control.  I will see him back in about 2 months for diabetic foot exam or sooner if any issues are to arise.  Trula Slade DPM

## 2020-09-13 ENCOUNTER — Telehealth: Payer: Self-pay

## 2020-10-06 ENCOUNTER — Other Ambulatory Visit: Payer: Self-pay | Admitting: Family Medicine

## 2020-10-06 NOTE — Progress Notes (Signed)
Chronic Care Management Pharmacy Note  10/07/2020 Name:  Aaron Fox MRN:  [id number] DOB:  10-27-1955  Subjective: Aaron Fox is an 65 y.o. year old male who is a primary patient of Pickard, Cammie Mcgee, MD.  The CCM team was consulted for assistance with disease management and care coordination needs.    Engaged with patient by telephone for initial visit in response to provider referral for pharmacy case management and/or care coordination services.   Consent to Services:  The patient was given the following information about Chronic Care Management services today, agreed to services, and gave verbal consent: 1. CCM service includes personalized support from designated clinical staff supervised by the primary care provider, including individualized plan of care and coordination with other care providers 2. 24/7 contact phone numbers for assistance for urgent and routine care needs. 3. Service will only be billed when office clinical staff spend 20 minutes or more in a month to coordinate care. 4. Only one practitioner may furnish and bill the service in a calendar month. 5.The patient may stop CCM services at any time (effective at the end of the month) by phone call to the office staff. 6. The patient will be responsible for cost sharing (co-pay) of up to 20% of the service fee (after annual deductible is met). Patient agreed to services and consent obtained.  Patient Care Team: [name], MD as PCP - General (Family Medicine) Edythe Clarity, [facility] as Pharmacist (Pharmacist)  Recent office visits: 05/07/20 Dennard Schaumann) - patient referred to Endo at his request to manage sugars.  Most recently his blood glucose has been well controlled.  Recent consult visits: 09/09/20 Jacqualyn Posey, [place]) - regular foot care, return in two weeks for diabetic foot exam  05/28/2020 Marinus Maw, Endo) - patient was to stop meal time insulin due to having some episodes of  hypoglycemia.  05/14/20 Marinus Maw, Endo) - Novolin N was decreased to 20 units at night due to hypoglycemia, only to give meal time insulin in glucose is > 90.  04/09/20 Loanne Drilling) - no changes to medications, instructed to monitor blood glucose 7 times per day.  Hospital visits: None in previous 6 months  Objective:  Lab Results  Component Value Date   CREATININE 1.03 05/07/2020   BUN 19 05/07/2020   GFR 79.79 10/15/2017   GFRNONAA 76 05/07/2020   GFRAA 89 05/07/2020   NA 140 05/07/2020   K 4.4 05/07/2020   CALCIUM 9.5 05/07/2020   CO2 22 05/07/2020   GLUCOSE 99 05/07/2020    Lab Results  Component Value Date/Time   HGBA1C 7.0 08/06/2020 08:47 AM   HGBA1C 9.7 (A) 04/09/2020 07:41 AM   HGBA1C 6.7 (A) 12/19/2019 07:35 AM   HGBA1C 11.4 (H) 04/21/2019 01:13 PM   HGBA1C 6.7 (H) 07/20/2014 08:11 AM   GFR 79.79 10/15/2017 08:18 AM   GFR 85.11 04/28/2016 08:04 AM   MICROALBUR 7.9 05/07/2020 08:16 AM   MICROALBUR 17.5 (H) 10/15/2017 08:18 AM    Last diabetic Eye exam:  Lab Results  Component Value Date/Time   HMDIABEYEEXA Retinopathy (A) 03/21/2019 11:16 AM    Last diabetic Foot exam:  Lab Results  Component Value Date/Time   HMDIABFOOTEX yes 06/15/2008 12:00 AM     Lab Results  Component Value Date   CHOL 127 05/07/2020   HDL 47 05/07/2020   LDLCALC 65 05/07/2020   LDLDIRECT 120.2 04/04/2013   TRIG 71 05/07/2020   CHOLHDL 2.7 05/07/2020    Hepatic Function Latest Ref  Rng & Units 05/07/2020 04/21/2019 10/25/2018  Total Protein 6.1 - 8.1 g/dL 7.4 8.0 6.8  Albumin 3.5 - 5.2 g/dL - - -  AST 10 - 35 U/L 84(H) 44(H) 48(H)  ALT 9 - 46 U/L 53(H) 23 32  Alk Phosphatase 39 - 117 U/L - - -  Total Bilirubin 0.2 - 1.2 mg/dL 0.5 0.7 0.4  Bilirubin, Direct 0.0 - 0.3 mg/dL - - -    Lab Results  Component Value Date/Time   TSH 1.10 10/15/2017 08:18 AM   TSH 1.63 04/28/2016 08:04 AM    CBC Latest Ref Rng & Units 05/07/2020 05/05/2019 04/26/2019  WBC 3.8 - 10.8 Thousand/uL  3.5(L) 4.5 5.1  Hemoglobin 13.2 - 17.1 g/dL 12.5(L) 10.5(L) 10.2(L)  Hematocrit 38.5 - 50.0 % 38.5 31.2(L) 30.6(L)  Platelets 140 - 400 Thousand/uL 240 244 168    No results found for: VD25OH  Clinical ASCVD: No  The ASCVD Risk score Mikey Bussing DC Jr., et al., 2013) failed to calculate for the following reasons:   The systolic blood pressure is missing   The valid total cholesterol range is 130 to 320 mg/dL    Depression screen [facility] 2/9 08/27/2020 05/07/2020  Decreased Interest 0 0  Down, Depressed, Hopeless 0 0  PHQ - 2 Score 0 0  Some recent data might be hidden     Social History   Tobacco Use  Smoking Status Former   Types: Cigarettes   Quit date: 11/02/1999   Years since quitting: 20.9  Smokeless Tobacco Never   BP Readings from Last 3 Encounters:  08/06/20 124/75  05/28/20 137/78  05/14/20 (!) 144/69   Pulse Readings from Last 3 Encounters:  08/06/20 60  05/28/20 (!) 57  05/14/20 (!) 54   Wt Readings from Last 3 Encounters:  08/06/20 215 lb 3.2 oz (97.6 kg)  05/28/20 219 lb (99.3 kg)  05/14/20 225 lb (102.1 kg)   BMI Readings from Last 3 Encounters:  08/06/20 30.01 kg/m  05/28/20 30.54 kg/m  05/14/20 31.38 kg/m    Assessment/Interventions: Review of patient past medical history, allergies, medications, health status, including review of consultants reports, laboratory and other test data, was performed as part of comprehensive evaluation and provision of chronic care management services.   SDOH:  (Social Determinants of Health) assessments and interventions performed: Yes  SDOH Screenings   Alcohol Screen: Low Risk    Last Alcohol Screening Score (AUDIT): 0  Depression (PHQ2-9): Low Risk    PHQ-2 Score: 0  Financial Resource Strain: Low Risk    Difficulty of Paying Living Expenses: Not hard at all  Food Insecurity: No Food Insecurity   Worried About Charity fundraiser in the Last Year: Never true   Ran Out of Food in the Last Year: Never true  Housing:  Low Risk    Last Housing Risk Score: 0  Physical Activity: Sufficiently Active   Days of Exercise per Week: 7 days   Minutes of Exercise per Session: 30 min  Social Connections: Moderately Integrated   Frequency of Communication with Friends and Family: More than three times a week   Frequency of Social Gatherings with Friends and Family: Three times a week   Attends Religious Services: More than 4 times per year   Active Member of Clubs or Organizations: No   Attends Archivist Meetings: Never   Marital Status: Married  Stress: No Stress Concern Present   Feeling of Stress : Not at all  Tobacco Use: Medium Risk  Smoking Tobacco Use: Former   Smokeless Tobacco Use: Never  Transportation Needs: No Data processing manager (Medical): No   Lack of Transportation (Non-Medical): No    CCM Care Plan  Allergies  Allergen Reactions   Penicillins Swelling    Swelling of the hands and feet, skin peeling Did it involve swelling of the face/tongue/throat, SOB, or low BP? No Did it involve sudden or severe rash/hives, skin peeling, or any reaction on the inside of your mouth or nose? No Did you need to seek medical attention at a hospital or doctor's office? Yes When did it last happen?      10 + years If all above answers are "NO", may proceed with cephalosporin use.    Ciprofloxacin Hives and Rash   Terbinafine Hcl Hives   Zestril [Lisinopril] Cough    Medications Reviewed Today     Reviewed by Edythe Clarity, [facility] (Pharmacist) on 10/07/20 at [place] List Status: <None>   Medication Order Taking? Sig Documenting Provider Last Dose Status Informant  ACCU-CHEK GUIDE test strip [id number] Yes TEST BLOOD SUGAR 7 TIMES DAILY Renato Shin, MD Taking Active   Accu-Chek Softclix Lancets lancets [id number] Yes TEST  7  TIMES  DAILY Renato Shin, MD Taking Active   Alcohol Swabs (B-D SINGLE USE SWABS REGULAR) PADS [id number] Yes Use to cleanse site prior  to use of lancet to obtain droplet of blood two times per day; E10.49 Renato Shin, MD Taking Active Self  aspirin 81 MG tablet [id number] Yes Take 81 mg by mouth daily. [provider] Taking Active Self  atorvastatin (LIPITOR) 10 MG tablet [id number] Yes TAKE 1 TABLET EVERY DAY Pickard, Cammie Mcgee, MD Taking Active   Blood Glucose Monitoring Suppl (ACCU-CHEK GUIDE ME) w/Device KIT [id number] Yes 1 each by Does not apply route See admin instructions. Use to monitor glucose levels 7 times daily; E11.9; WILL NOT COMPLETE PA Renato Shin, MD Taking Active   DROPLET INSULIN SYRINGE 29G X 1/2" 1 ML MISC [id number] Yes USE IN THE MORNING AND AT BEDTIME Renato Shin, MD Taking Active   gabapentin (NEURONTIN) 300 MG capsule [id number] Yes TAKE 2 CAPSULES THREE TIMES DAILY [name], MD Taking Active   insulin isophane & regular human (NOVOLIN 70/30 FLEXPEN) (70-30) 100 UNIT/ML KwikPen [id number] Yes Inject 10 Units into the skin 2 (two) times daily with a meal. With breakfast and supper if glucose is above 90 AND you are eating. Brita Romp, NP Taking Active   losartan (COZAAR) 50 MG tablet [id number] Yes TAKE 1 TABLET EVERY DAY [name], MD Taking Active   Tetrahydrozoline HCl [facility] OP) [id number] Yes Place 1 drop into both eyes daily as needed (redness). [provider] Taking Active Self  triamcinolone (KENALOG) 0.1 % [id number] Yes APPLY TO THE AFFECTED AREA(S) TOPICALLY THREE TIMES DAILY AS NEEDED FOR Baldo Ash, MD Taking Active             Patient Active Problem List   Diagnosis Date Noted   Osteomyelitis ([place]) 04/25/2019   Diabetic retinopathy of both eyes ([place])    Low back pain 12/17/2017   Irritant contact dermatitis 09/21/2016   Cervical radiculitis 02/08/2016   Hx of adenomatous colonic polyps 06/09/2015   Dyspnea on exertion 05/03/2015   Lymphadenopathy, retroperitoneal 09/17/2014   PSA elevation 05/27/2014   Iron deficiency anemia  05/27/2014   Wellness examination 04/20/2014   Diabetic foot ulcer ([place]) 11/21/2013   Type 1  diabetes mellitus with neurological manifestations, uncontrolled ([place]) 10/22/2013   Piriformis syndrome of left side 08/25/2013   Dyspnea 07/24/2013   Knee pain 07/24/2013   Routine general medical examination at a health care facility 07/24/2013   Numbness and tingling in hands 10/14/2012   Encounter for long-term (current) use of other medications 11/13/2011   Disturbance of skin sensation 11/13/2011   Screening for prostate cancer 11/13/2011   Inflammatory and toxic neuropathy, 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   Shoulder pain, right 11/13/2011   BALANITIS 09/01/2009   HYPOKALEMIA 05/31/2009   Essential hypertension 05/31/2009   LEG PAIN, BILATERAL 02/15/2009   HYPERCHOLESTEROLEMIA 07/02/2008   ALLERGIC RHINITIS 07/02/2008   CHF 06/19/2008   MICROSCOPIC HEMATURIA 06/19/2008   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   Hypogonadism male 01/15/2007   Diabetes ([place]) 09/22/2006   Gout 09/22/2006   RHEUMATOID ARTHRITIS 09/22/2006    Immunization History  Administered Date(s) Administered   Influenza Whole 12/28/2008   Influenza,inj,Quad PF,6+ Mos 11/21/2013, 04/01/2015, 12/20/2015, 01/08/2017, 12/17/2017, 10/25/2018, 12/19/2019   Moderna Sars-Covid-2 Vaccination 04/29/2019, 05/27/2019, 12/29/2019   Pneumococcal Polysaccharide-23 06/11/2008   Td 09/29/2005   Tdap 10/15/2017   Zoster Recombinat (Shingrix) 06/14/2020, 08/25/2020    Conditions to be addressed/monitored:  HTN, Type I Diabetes w/ Neuropathy, Hypercholesterolemia  Care Plan : General Pharmacy (Adult)  Updates made by Edythe Clarity, RPH since 10/07/2020 12:00 AM     Problem: HTN, DM, HLD   Priority: High  Onset Date: 06/11/2020     Long-Range Goal: Patient-Specific Goal   Start Date: 06/11/2020  Expected End Date: 12/11/2020  Recent Progress: On track  Priority: High  Note:   Current Barriers:  Unable to independently  monitor therapeutic efficacy Unable to achieve control of glucose   Pharmacist Clinical Goal(s):  Patient will verbalize ability to afford treatment regimen achieve adherence to monitoring guidelines and medication adherence to achieve therapeutic efficacy adhere to prescribed medication regimen as evidenced by fill dates contact provider office for questions/concerns as evidenced notation of same in electronic health record through collaboration with PharmD and provider.   Interventions: 1:1 collaboration with [name], MD regarding development and update of comprehensive plan of care as evidenced by provider attestation and co-signature Inter-disciplinary care team collaboration (see longitudinal plan of care) Comprehensive medication review performed; medication list updated in electronic medical record  Hypertension (BP goal <130/80) -Controlled -Current treatment: Losartan 100mg  daily -Medications previously tried: none noted  -Current home readings: does not have meter at home -Current dietary habits: he has made good changes in diet after recent A1c, always bakes food, mostly water intake -Current exercise habits: very active, 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%) -Controlled -Current medications: Novolin 70/30 10 units BID -Medications previously tried: Novolin R -Current home glucose readings fasting glucose: 130-160 post prandial glucose:  -Denies hypoglycemic/hyperglycemic symptoms -Current meal patterns: baked instead of fried, eats a lot of salads, no soda intake -Current exercise: see above -Educated on A1c and blood sugar goals; Complications of diabetes including kidney damage, retinal damage, and cardiovascular disease; Exercise goal of 150 minutes per week; Prevention and management of hypoglycemic episodes; Benefits of routine self-monitoring of blood sugar;  -He recently stopped meal time insulin due to hypoglycemia.  He reports only one episode of hypoglycemia since then when he went a long time without eating. -Reports that his increased A1c was due to him not controlling his diet like he was previously. -Counseled to check feet daily and get yearly eye exams -Recommended to continue current medication Recommended he eat consistent meals throughout the day to avoid hyoglycemia.  Encouraged him to get back on track with diet and get A1c to goal.  He is motivated to do so.  Update 10/07/20 Changed to Novoling 70/30 10 units bid A1c improved to 7.0! Fasting Sugars - 90-150s He is now back to watching what he eats and sugars are much more controlled.  Congratulated on efforts and lifestyle mods. Continue current meds for now.  Patient Goals/Self-Care Activities Patient will:   - take medications as prescribed check glucose daily, document, and provide at future appointments check blood pressure if able, document, and provide at future appointments target a minimum of 150 minutes of moderate intensity exercise weekly  Follow Up Plan: The care management team will reach out to the patient again over the next 90 days.          Medication Assistance: None required.  Patient affirms current coverage meets needs.  Patient's preferred pharmacy is:  [place], [name] - [place] [address] [place] [address] Phone: [phone] Fax: [phone]  [facility] - [place], [place] [address] Phone: [phone] Fax: [phone]  Uses pill box? No - does not miss doses Pt endorses 100% compliance  We discussed: Benefits of medication synchronization, packaging and delivery as well as enhanced pharmacist oversight with [facility]. Patient decided to: Utilize [facility] pharmacy for medication synchronization, packaging and delivery  Care Plan and Follow Up Patient Decision:  Patient agrees to Care Plan and Follow-up.  Plan: The care management team will reach out to the patient again over the next 90 days.  Beverly Milch, PharmD Clinical Pharmacist [place] [phone]

## 2020-10-07 ENCOUNTER — Ambulatory Visit (INDEPENDENT_AMBULATORY_CARE_PROVIDER_SITE_OTHER): Payer: Medicare HMO | Admitting: Pharmacist

## 2020-10-07 DIAGNOSIS — E1065 Type 1 diabetes mellitus with hyperglycemia: Secondary | ICD-10-CM | POA: Diagnosis not present

## 2020-10-07 DIAGNOSIS — I10 Essential (primary) hypertension: Secondary | ICD-10-CM | POA: Diagnosis not present

## 2020-10-07 DIAGNOSIS — E1049 Type 1 diabetes mellitus with other diabetic neurological complication: Secondary | ICD-10-CM

## 2020-10-07 DIAGNOSIS — IMO0002 Reserved for concepts with insufficient information to code with codable children: Secondary | ICD-10-CM

## 2020-10-07 NOTE — Patient Instructions (Addendum)
Visit Information   Goals Addressed             This Visit's Progress    Monitor and Manage My Blood Sugar-Diabetes Type 1   On track    Timeframe:  Long-Range Goal Priority:  High Start Date:   06/11/20                          Expected End Date:  12/11/20                     Follow Up Date 12/27/20   - check blood sugar at prescribed times - check blood sugar if I feel it is too high or too low - enter blood sugar readings and medication or insulin into daily log - take the blood sugar log to all doctor visits    Why is this important?   Checking your blood sugar at home helps to keep it from getting very high or very low.  Writing the results in a diary or log helps the doctor know how to care for you.  Your blood sugar log should have the time, the date and the results.  Also, write down the amount of insulin or other medicine you take.  Other information like what you ate, exercise done and how you were feeling will also be helpful..     Notes: Goal fasting 80-130  10/07/20 - A1c improved to 7.0!!       Patient Care Plan: General Pharmacy (Adult)     Problem Identified: HTN, DM, HLD   Priority: High  Onset Date: 06/11/2020     Long-Range Goal: Patient-Specific Goal   Start Date: 06/11/2020  Expected End Date: 12/11/2020  Recent Progress: On track  Priority: High  Note:   Current Barriers:  Unable to independently monitor therapeutic efficacy Unable to achieve control of glucose   Pharmacist Clinical Goal(s):  Patient will verbalize ability to afford treatment regimen achieve adherence to monitoring guidelines and medication adherence to achieve therapeutic efficacy adhere to prescribed medication regimen as evidenced by fill dates contact provider office for questions/concerns as evidenced notation of same in electronic health record through collaboration with PharmD and provider.   Interventions: 1:1 collaboration with [name], MD regarding  development and update of comprehensive plan of care as evidenced by provider attestation and co-signature Inter-disciplinary care team collaboration (see longitudinal plan of care) Comprehensive medication review performed; medication list updated in electronic medical record  Hypertension (BP goal <130/80) -Controlled -Current treatment: Losartan '100mg'$  daily -Medications previously tried: none noted  -Current home readings: does not have meter at home -Current dietary habits: he has made good changes in diet after recent A1c, always bakes food, mostly water intake -Current exercise habits: very active, 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%) -Controlled -Current medications: Novolin 70/30 10 units BID -Medications previously tried: Novolin  R -Current home glucose readings fasting glucose: 130-160 post prandial glucose:  -Denies hypoglycemic/hyperglycemic symptoms -Current meal patterns: baked instead of fried, eats a lot of salads, no soda intake -Current exercise: see above -Educated on A1c and blood sugar goals; Complications of diabetes including kidney damage, retinal damage, and cardiovascular disease; Exercise goal of 150 minutes per week; Prevention and management of hypoglycemic episodes; Benefits of routine self-monitoring of blood sugar;  -He recently stopped meal time insulin due to hypoglycemia.  He reports only one episode of hypoglycemia since then when he went a long time without eating. -Reports that his increased A1c was due to him not controlling his diet like he was previously. -Counseled to check feet daily and get yearly eye exams -Recommended to continue current medication Recommended he eat consistent meals throughout the day to avoid hyoglycemia.  Encouraged him to get back on track with diet and get A1c to goal.  He is motivated to do so.  Update 10/07/20 Changed to Novoling 70/30 10 units bid A1c improved to 7.0! Fasting Sugars - 90-150s He is now back to watching what he eats and sugars are much more controlled.  Congratulated on efforts and lifestyle mods. Continue current meds for now.  Patient Goals/Self-Care Activities Patient will:  - take medications as prescribed check glucose daily, document, and provide at future appointments check blood pressure if able, document, and provide at future appointments target a minimum of 150 minutes of moderate intensity exercise weekly  Follow Up Plan: The care management team will reach out to the patient again over the next 90 days.         The patient verbalized understanding of instructions, educational materials, and care plan provided today and agreed to receive a mailed copy of patient instructions, educational materials, and care plan.   Telephone follow up appointment with pharmacy team member scheduled for: 6 months  Edythe Clarity, [place]

## 2020-10-30 ENCOUNTER — Other Ambulatory Visit: Payer: Self-pay | Admitting: Endocrinology

## 2020-10-30 DIAGNOSIS — E11319 Type 2 diabetes mellitus with unspecified diabetic retinopathy without macular edema: Secondary | ICD-10-CM

## 2020-10-30 DIAGNOSIS — Z794 Long term (current) use of insulin: Secondary | ICD-10-CM

## 2020-11-08 ENCOUNTER — Other Ambulatory Visit: Payer: Self-pay

## 2020-11-08 ENCOUNTER — Encounter: Payer: Self-pay | Admitting: Podiatry

## 2020-11-08 ENCOUNTER — Ambulatory Visit: Payer: Medicare HMO | Admitting: Podiatry

## 2020-11-08 DIAGNOSIS — L84 Corns and callosities: Secondary | ICD-10-CM | POA: Diagnosis not present

## 2020-11-08 DIAGNOSIS — M79675 Pain in left toe(s): Secondary | ICD-10-CM | POA: Diagnosis not present

## 2020-11-08 DIAGNOSIS — E1149 Type 2 diabetes mellitus with other diabetic neurological complication: Secondary | ICD-10-CM | POA: Diagnosis not present

## 2020-11-08 DIAGNOSIS — Z89439 Acquired absence of unspecified foot: Secondary | ICD-10-CM | POA: Diagnosis not present

## 2020-11-08 DIAGNOSIS — B351 Tinea unguium: Secondary | ICD-10-CM | POA: Diagnosis not present

## 2020-11-10 NOTE — Progress Notes (Signed)
Subjective: 65 year old male presents the office today for thick elongated toenails on his left foot he cannot trim himself.  Denies any swelling redness or any drainage.  He does get a red spot on top of his left second toe which keeps a Band-Aid on it.  Denies any ulcerations or skin breakdown.  No other concerns today.  No fevers or chills.  Right foot has been doing well.  Objective: AAO x3, NAD DP/PT pulses palpable bilaterally, CRT less than 3 seconds Nails on left foot are hypertrophic, dystrophic with yellow-brown discoloration.  He does get tenderness objective to the nails 1-5 on the left foot likely irritate in his shoes of their long.  No swelling or redness.  Hammertoes are present and there is a preulcerative callus at the tip of the left first and third toes without any underlying ulceration drainage or signs of infection.  There is a preulcerative area on the dorsal aspect the left second toe, hammertoe there is no skin breakdown identified there is no edema, erythema. Status post right transmetatarsal imitation No pain with calf compression, swelling, warmth, erythema  Assessment: Symptomatic onychomycosis, preulcerative callus left foot with hammertoes  Plan: -All treatment options discussed with the patient including all alternatives, risks, complications.  -Sharply debrided the nails x5 without any complications or bleeding -Debrided hyperkeratotic lesions x2 without any complications or bleeding -Monitor the second toe closely as well as the other digits.  Continue offloading.  Trula Slade DPM

## 2020-11-12 ENCOUNTER — Telehealth: Payer: Self-pay | Admitting: Podiatry

## 2020-11-12 NOTE — Telephone Encounter (Signed)
Pt left message asking for a call back he has a question about diabetic shoes.  I returned call and pt was asking it there was a store he could walk into  to to purchase diabetic shoes.  [facility] as they have a pedorthist on staff and know a lot about depth and width for diabetic shoes. I also told pt that we can order shoes for pt and the cost is 150.00 self pay. He is going [facility] at [facility] and call if he decides he wants to come here.

## 2020-11-16 ENCOUNTER — Encounter: Payer: Self-pay | Admitting: Podiatry

## 2020-11-16 ENCOUNTER — Other Ambulatory Visit: Payer: Self-pay

## 2020-11-16 ENCOUNTER — Ambulatory Visit: Payer: Medicare HMO | Admitting: Podiatry

## 2020-11-16 DIAGNOSIS — L02612 Cutaneous abscess of left foot: Secondary | ICD-10-CM | POA: Diagnosis not present

## 2020-11-16 DIAGNOSIS — L03032 Cellulitis of left toe: Secondary | ICD-10-CM

## 2020-11-16 DIAGNOSIS — L97522 Non-pressure chronic ulcer of other part of left foot with fat layer exposed: Secondary | ICD-10-CM | POA: Diagnosis not present

## 2020-11-16 DIAGNOSIS — L84 Corns and callosities: Secondary | ICD-10-CM

## 2020-11-16 MED ORDER — DOXYCYCLINE HYCLATE 100 MG PO TABS: 100.0000 mg | ORAL_TABLET | Freq: Two times a day (BID) | ORAL | 0 refills | Status: DC

## 2020-11-19 ENCOUNTER — Telehealth: Payer: Self-pay

## 2020-11-19 DIAGNOSIS — Z794 Long term (current) use of insulin: Secondary | ICD-10-CM

## 2020-11-19 DIAGNOSIS — E11319 Type 2 diabetes mellitus with unspecified diabetic retinopathy without macular edema: Secondary | ICD-10-CM

## 2020-11-19 LAB — WOUND CULTURE
MICRO NUMBER:: 12397881
SPECIMEN QUALITY:: ADEQUATE

## 2020-11-19 NOTE — Telephone Encounter (Signed)
Patient will need to contact endocrinologist.

## 2020-11-19 NOTE — Telephone Encounter (Signed)
Pt called in requesting to get a new Blood Glucose Monitoring Suppl called in to pharmacy.   Cb#:[phone]

## 2020-11-22 NOTE — Progress Notes (Signed)
Subjective: 65 year old male presents the office today for concerns of a wound to his left third toe.  He was last in the office last week and use whenever preulcerative callus at the tip of the toe.  Over the weekend he started to notice drainage coming from the toe.  Mild swelling.  No redness.  He has no fevers or chills.  He has no other concerns.  Objective: AAO x3, NAD DP/PT pulses palpable bilaterally, CRT less than 3 seconds Hammertoes present.  There is hyperkeratotic tissue with what appears to be a blister at the distal aspect left third toe.  Small amount of purulent drainage expressed with appears to be superficial.  After debridement there is a wound measuring 0.4 x 0.4 x 0.2 cm without any probing to bone, undermining or tunneling.  Prior to debridement the wound was closed covered with callus, posterior tissue.  Minimal edema to the toe.  There is no ascending cellulitis.  No fluctuance or crepitation.  No open lesions or pre-ulcerative lesions. Hammertoes present.  No pain with calf compression, swelling, warmth, erythema  Assessment: Ulceration left third toe, superficial abscess  Plan: -All treatment options discussed with the patient including all alternatives, risks, complications.  -Sharply debrided the wound today utilizing #312 with scalpel removal of nonviable devitalized tissue to help promote wound healing.  There is no blood loss.  He tolerated the procedure well.  Purulence identified and a wound culture was obtained. -Doxycycline -Offloading.  Offloading pads were dispensed -Monitor for any clinical signs or symptoms of infection and directed to call the office immediately should any occur or go to the ER. -Patient encouraged to call the office with any questions, concerns, change in symptoms.   Return in about 2 weeks (around 11/30/2020) for toe ulcer. X-ray left foot next appointment   Trula Slade DPM

## 2020-12-03 ENCOUNTER — Ambulatory Visit (INDEPENDENT_AMBULATORY_CARE_PROVIDER_SITE_OTHER): Payer: Medicare HMO

## 2020-12-03 ENCOUNTER — Ambulatory Visit: Payer: Medicare HMO | Admitting: Podiatry

## 2020-12-03 ENCOUNTER — Other Ambulatory Visit: Payer: Self-pay

## 2020-12-03 DIAGNOSIS — L97522 Non-pressure chronic ulcer of other part of left foot with fat layer exposed: Secondary | ICD-10-CM

## 2020-12-03 DIAGNOSIS — L02612 Cutaneous abscess of left foot: Secondary | ICD-10-CM

## 2020-12-03 DIAGNOSIS — L03032 Cellulitis of left toe: Secondary | ICD-10-CM

## 2020-12-06 ENCOUNTER — Ambulatory Visit: Payer: Medicare HMO | Admitting: Nurse Practitioner

## 2020-12-08 NOTE — Progress Notes (Signed)
Subjective: 65 year old male presents the office today for concerns of a wound to his left third toe.  States the wound is doing better.  Still little sore at times.  No redness or swelling.  Slight bloody drainage but no purulence.  No odor that he reports.  He has been keeping a small amount of antibiotic ointment to the area.  No fevers or chills.  His last blood sugar report was 140 and his last A1c was 7.  Objective: AAO x3, NAD DP/PT pulses palpable bilaterally, CRT less than 3 seconds Hammertoes present.  Minimal hyperkeratotic tissue present distal aspect left third toe.  Upon debridement only a very small superficial area of skin breakdown still evident but appears to be almost healed.  There is no significant edema, erythema, ascending cellulitis.  There is no drainage or pus.  No malodor. No pain with calf compression, swelling, warmth, erythema  Assessment: Ulceration left third toe, superficial abscess  Plan: -All treatment options discussed with the patient including all alternatives, risks, complications.  -Repeat x-rays obtained and reviewed.  No obvious signs of acute osteomyelitis but is difficult to evaluate fully given the digital deformity. -Sharp debrided the wound today utilizing a #312 with scalpel down to healthy, granular tissue but appears wound is almost healed.  I would still continue a small amount of antibiotic ointment dressing changes daily.  Continue offloading at all times. -Monitor for any clinical signs or symptoms of infection and directed to call the office immediately should any occur or go to the ER.  Return in about 3 weeks (around 12/24/2020) for ulcer left toe.  Trula Slade DPM

## 2020-12-09 ENCOUNTER — Other Ambulatory Visit: Payer: Self-pay | Admitting: Family Medicine

## 2020-12-10 ENCOUNTER — Ambulatory Visit: Payer: Medicare HMO | Admitting: Nurse Practitioner

## 2020-12-10 DIAGNOSIS — Z794 Long term (current) use of insulin: Secondary | ICD-10-CM

## 2020-12-10 DIAGNOSIS — E1165 Type 2 diabetes mellitus with hyperglycemia: Secondary | ICD-10-CM | POA: Diagnosis not present

## 2020-12-11 LAB — COMPREHENSIVE METABOLIC PANEL
ALT: 50 IU/L — ABNORMAL HIGH (ref 0–44)
AST: 52 IU/L — ABNORMAL HIGH (ref 0–40)
Albumin/Globulin Ratio: 1.4 (ref 1.2–2.2)
Albumin: 4.4 g/dL (ref 3.8–4.8)
Alkaline Phosphatase: 110 IU/L (ref 44–121)
BUN/Creatinine Ratio: 16 (ref 10–24)
BUN: 16 mg/dL (ref 8–27)
Bilirubin Total: 0.4 mg/dL (ref 0.0–1.2)
CO2: 22 mmol/L (ref 20–29)
Calcium: 9.6 mg/dL (ref 8.6–10.2)
Chloride: 108 mmol/L — ABNORMAL HIGH (ref 96–106)
Creatinine, Ser: 0.97 mg/dL (ref 0.76–1.27)
Globulin, Total: 3.2 g/dL (ref 1.5–4.5)
Glucose: 85 mg/dL (ref 70–99)
Potassium: 4.5 mmol/L (ref 3.5–5.2)
Sodium: 143 mmol/L (ref 134–144)
Total Protein: 7.6 g/dL (ref 6.0–8.5)
eGFR: 87 mL/min/{1.73_m2} (ref >59)

## 2020-12-13 ENCOUNTER — Ambulatory Visit: Payer: Medicare HMO | Admitting: Podiatry

## 2020-12-13 ENCOUNTER — Ambulatory Visit (INDEPENDENT_AMBULATORY_CARE_PROVIDER_SITE_OTHER): Payer: Medicare HMO

## 2020-12-13 ENCOUNTER — Other Ambulatory Visit: Payer: Self-pay

## 2020-12-13 ENCOUNTER — Encounter: Payer: Self-pay | Admitting: Podiatry

## 2020-12-13 ENCOUNTER — Telehealth: Payer: Self-pay | Admitting: Pharmacist

## 2020-12-13 DIAGNOSIS — L02612 Cutaneous abscess of left foot: Secondary | ICD-10-CM

## 2020-12-13 DIAGNOSIS — L03032 Cellulitis of left toe: Secondary | ICD-10-CM | POA: Diagnosis not present

## 2020-12-13 DIAGNOSIS — L97522 Non-pressure chronic ulcer of other part of left foot with fat layer exposed: Secondary | ICD-10-CM

## 2020-12-13 MED ORDER — DOXYCYCLINE HYCLATE 100 MG PO TABS: 100.0000 mg | ORAL_TABLET | Freq: Two times a day (BID) | ORAL | 0 refills | Status: DC

## 2020-12-13 MED ORDER — MUPIROCIN 2 % EX OINT: 1.0000 "application " | TOPICAL_OINTMENT | Freq: Two times a day (BID) | CUTANEOUS | 2 refills | Status: DC

## 2020-12-13 NOTE — Progress Notes (Signed)
Chronic Care Management Pharmacy Assistant   Name: Aaron Fox  MRN: [id number] DOB: 12/21/1955  Reason for Encounter: Disease State For DM.    Conditions to be addressed/monitored: HTN, Type I Diabetes w/ Neuropathy, Hypercholesterolemia  Recent office visits:  None since 10/07/20  Recent consult visits:  12/03/20 Podiatry [place] Dec, DPM. For follow-up. No medication changes. 11/16/20 Podiatry [place] Dec, DPM. For follow-up. STARTED Doxycycline 100 mg 2 times daily.  11/08/20 Podiatry [place] Dec, DPM. For follow-up. No medication changes.   Hospital visits:  None since 10/07/20  Medications: Outpatient Encounter Medications as of 12/13/2020  Medication Sig   ACCU-CHEK GUIDE test strip TEST BLOOD SUGAR 7 TIMES DAILY   Accu-Chek Softclix Lancets lancets TEST  7  TIMES  DAILY   Alcohol Swabs (B-D SINGLE USE SWABS REGULAR) PADS Use to cleanse site prior to use of lancet to obtain droplet of blood two times per day; E10.49   aspirin 81 MG tablet Take 81 mg by mouth daily.   atorvastatin (LIPITOR) 10 MG tablet TAKE 1 TABLET EVERY DAY   Blood Glucose Monitoring Suppl (ACCU-CHEK GUIDE ME) w/Device KIT 1 each by Does not apply route See admin instructions. Use to monitor glucose levels 7 times daily; E11.9; WILL NOT COMPLETE PA   doxycycline (VIBRA-TABS) 100 MG tablet Take 1 tablet (100 mg total) by mouth 2 (two) times daily.   DROPLET INSULIN SYRINGE 29G X 1/2" 1 ML MISC USE IN THE MORNING AND AT BEDTIME   gabapentin (NEURONTIN) 300 MG capsule TAKE 2 CAPSULES THREE TIMES DAILY   insulin isophane & regular human (NOVOLIN 70/30 FLEXPEN) (70-30) 100 UNIT/ML KwikPen Inject 10 Units into the skin 2 (two) times daily with a meal. With breakfast and supper if glucose is above 90 AND you are eating.   losartan (COZAAR) 50 MG tablet TAKE 1 TABLET EVERY DAY   mupirocin ointment (BACTROBAN) 2 % Apply 1 application topically 2 (two) times daily.   SHINGRIX injection     Tetrahydrozoline HCl (VISINE OP) Place 1 drop into both eyes daily as needed (redness).   triamcinolone cream (KENALOG) 0.1 % APPLY TO THE AFFECTED AREA(S) TOPICALLY THREE TIMES DAILY AS NEEDED FOR RASH   No facility-administered encounter medications on file as of 12/13/2020.   Recent Relevant Labs: Lab Results  Component Value Date/Time   HGBA1C 7.0 08/06/2020 08:47 AM   HGBA1C 9.7 (A) 04/09/2020 07:41 AM   HGBA1C 6.7 (A) 12/19/2019 07:35 AM   HGBA1C 11.4 (H) 04/21/2019 01:13 PM   HGBA1C 6.7 (H) 07/20/2014 08:11 AM   MICROALBUR 7.9 05/07/2020 08:16 AM   MICROALBUR 17.5 (H) 10/15/2017 08:18 AM    Kidney Function Lab Results  Component Value Date/Time   CREATININE 0.97 12/10/2020 08:47 AM   CREATININE 1.03 05/07/2020 08:16 AM   CREATININE 1.10 05/05/2019 12:36 PM   GFR 79.79 10/15/2017 08:18 AM   GFRNONAA 76 05/07/2020 08:16 AM   GFRAA 89 05/07/2020 08:16 AM    Current antihyperglycemic regimen:  Novolin 70/30 10 units BID  What recent interventions/DTPs have been made to improve glycemic control:  None.  Have there been any recent hospitalizations or ED visits since last visit with CPP? Patient stated no.  Patient denies hypoglycemic symptoms, including None  Patient reports hyperglycemic symptoms, including none  How often are you checking your blood sugar? Patient stated twice daily  What are your blood sugars ranging?  Patient stated his blood sugar range around 90-160.  During the week,  how often does your blood glucose drop below 70? Patient stated Never.  Are you checking your feet daily/regularly?  Patient was reminded to monitor his feet regularly.   Adherence Review: Is the patient currently on a STATIN medication? N/A.  Is the patient currently on ACE/ARB medication? Losartan 50 mg  Does the patient have >5 day gap between last estimated fill dates? Per misc rpts, no.   Care Gaps:Patient is due for eye exam.  Star Rating Drugs:Losartan 50 mg  10/07/20 90 DS.   Patient stated sometimes his meter doesn't work and he is inquiring on if he is able to receive a new one through insurance, sent a message to the nurse.   Follow-Up:Pharmacist Review  Charlann Lange, [place] Pharmacist Assistant [phone]

## 2020-12-14 ENCOUNTER — Telehealth: Payer: Self-pay | Admitting: *Deleted

## 2020-12-14 DIAGNOSIS — E11319 Type 2 diabetes mellitus with unspecified diabetic retinopathy without macular edema: Secondary | ICD-10-CM

## 2020-12-14 MED ORDER — ACCU-CHEK GUIDE ME W/DEVICE KIT: 1.0000 | PACK | 0 refills | Status: DC

## 2020-12-14 MED ORDER — ACCU-CHEK SOFTCLIX LANCETS MISC
3 refills | Status: DC
Start: 2020-12-14 — End: 2021-01-12

## 2020-12-14 MED ORDER — ACCU-CHEK GUIDE VI STRP: ORAL_STRIP | 3 refills | Status: DC

## 2020-12-14 NOTE — Telephone Encounter (Signed)
Prescription sent to pharmacy.

## 2020-12-14 NOTE — Telephone Encounter (Signed)
-----   Message from [place] sent at 12/14/2020  2:07 PM EDT ----- Regarding: New Meter Hi the patient is inquiring about if he is able to get a new blood sugar meter because the one he has doesn't always work correctly. He stated he's tried to contact someone in the office and has not had any luck, please advise.

## 2020-12-14 NOTE — Progress Notes (Signed)
Subjective: 65 year old male presents the office today for concerns of a wound to his left third toe.  He called my personal phone this morning by accident instead of the office but stated that the toe was still sore.  He denies any drainage or pus.  He states that while the toe is getting better but still having some soreness.  Ask about continuing antibiotics.  He has completed 10 days of doxycycline at last appointment.  The wound is healed and no signs of infection.  Denies any fevers or chills.  Objective: AAO x3, NAD DP/PT pulses palpable bilaterally, CRT less than 3 seconds Hammertoes present.  Minimal hyperkeratotic tissue present distal aspect left third toe.  Today there is a new blister formation upon debridement of the callus there was purulence identified underneath the area.  This was cultured today.  There is no probing to bone or exposed bone or tendon.  There is edema to the toe with faint erythema without any ascending cellulitis.  There is no fluctuance or crepitation.  No malodor. No pain with calf compression, swelling, warmth, erythema  Assessment: Ulceration left third toe, abscess  Plan: -All treatment options discussed with the patient including all alternatives, risks, complications.  -Repeat x-rays obtained reviewed.  No obvious signs of acute osteomyelitis but still difficult to evaluate given the hammertoe contractures. -Postoperatively the hyperkeratotic lesion to reveal underlying abscess which was cultured today.  Debrided nonviable tissue.  After debridement there is no further purulence. -Refill doxycycline.  Mupirocin ointment dressing changes daily.  New surgical shoe dispensed.  Elevation/limit activity.  -Monitor for any clinical signs or symptoms of infection and directed to call the office immediately should any occur or go to the ER.  Return in about 10 days (around 12/23/2020).  Trula Slade DPM

## 2020-12-16 ENCOUNTER — Other Ambulatory Visit: Payer: Self-pay | Admitting: Podiatry

## 2020-12-16 ENCOUNTER — Telehealth: Payer: Self-pay | Admitting: *Deleted

## 2020-12-16 LAB — WOUND CULTURE
MICRO NUMBER:: 12513984
SPECIMEN QUALITY:: ADEQUATE

## 2020-12-16 MED ORDER — CIPROFLOXACIN HCL 500 MG PO TABS: 500.0000 mg | ORAL_TABLET | Freq: Two times a day (BID) | ORAL | 0 refills | Status: AC

## 2020-12-16 NOTE — Telephone Encounter (Signed)
Called and left a message for the patient today and relayed the message that per Dr Jacqualyn Posey patient needs to go by the pharmacy and pick up a prescription. Lattie Haw

## 2020-12-16 NOTE — Patient Instructions (Signed)

## 2020-12-17 ENCOUNTER — Ambulatory Visit: Payer: Medicare HMO | Admitting: Nurse Practitioner

## 2020-12-17 ENCOUNTER — Other Ambulatory Visit: Payer: Self-pay

## 2020-12-17 ENCOUNTER — Encounter: Payer: Self-pay | Admitting: Nurse Practitioner

## 2020-12-17 ENCOUNTER — Other Ambulatory Visit: Payer: Self-pay | Admitting: Endocrinology

## 2020-12-17 VITALS — BP 151/68 | HR 65 | Ht 71.0 in | Wt 216.6 lb

## 2020-12-17 DIAGNOSIS — Z794 Long term (current) use of insulin: Secondary | ICD-10-CM | POA: Diagnosis not present

## 2020-12-17 DIAGNOSIS — E1165 Type 2 diabetes mellitus with hyperglycemia: Secondary | ICD-10-CM | POA: Diagnosis not present

## 2020-12-17 LAB — POCT GLYCOSYLATED HEMOGLOBIN (HGB A1C): HbA1c, POC (controlled diabetic range): 7.5 % — AB (ref 0.0–7.0)

## 2020-12-17 NOTE — Progress Notes (Signed)
Endocrinology Follow Up Note       12/17/2020, 8:53 AM   Subjective:    Patient ID: Aaron Fox, male    DOB: November 30, 1955.  Aaron Fox is being seen in follow up after being seen in consultation for management of currently uncontrolled symptomatic diabetes requested by  [name], MD.   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 Talbert Forest   CATARACT EXTRACTION, BILATERAL     COLONOSCOPY  2004   LYMPH NODE BIOPSY Right 10/16/2014   Procedure: RIGHT INGUINAL LYMPH NODE BIOPSY;  Surgeon: Aviva Signs, MD;  Location: [facility];  Service: General;  Laterality: Right;    Social History   Socioeconomic History   Marital status: Married    Spouse name: Not on file   Number of children: 3   Years of education: Not on file   Highest  education level: Not on file  Occupational History   Occupation: Disability    Employer: [employer]  Tobacco Use   Smoking status: Former    Types: Cigarettes    Quit date: 11/02/1999    Years since quitting: 21.1   Smokeless tobacco: Never  Vaping Use   Vaping Use: Never used  Substance and Sexual Activity   Alcohol use: No    Alcohol/week: 0.0 standard drinks   Drug use: No   Sexual activity: Not on file  Other Topics Concern   Not on file  Social History Narrative   ** Merged History Encounter **       Married, 3 kids Prior work [employer] before disability Regular exercise-yes   Social Determinants of Radio broadcast assistant Strain: Low Risk    Difficulty of Paying Living Expenses: Not hard at all  Food Insecurity: No Food Insecurity   Worried About Charity fundraiser in the Last Year: Never true   Arboriculturist in the Last Year: Never true  Transportation Needs: No Transportation Needs   Lack of Transportation (Medical):  No   Lack of Transportation (Non-Medical): No  Physical Activity: Sufficiently Active   Days of Exercise per Week: 7 days   Minutes of Exercise per Session: 30 min  Stress: No Stress Concern Present   Feeling of Stress : Not at all  Social Connections: Moderately Integrated   Frequency of Communication with Friends and Family: More than three times a week   Frequency of Social Gatherings with Friends and Family: Three times a week   Attends Religious Services: More than 4 times per year   Active Member of Clubs or Organizations: No   Attends Archivist Meetings: Never   Marital Status: Married    Family History  Problem Relation Age of Onset   Heart disease Other        FH of CAD   Colon polyps Paternal Uncle    Kidney disease Paternal Uncle    Diabetes Paternal Grandfather    Diabetes Maternal Uncle    Cancer Neg Hx    Colon cancer Neg Hx    Gallbladder disease Neg Hx    Esophageal cancer Neg Hx    Rectal cancer Neg Hx     Stomach cancer Neg Hx     Outpatient Encounter Medications as of 12/17/2020  Medication Sig   Accu-Chek Softclix Lancets lancets TEST  7  TIMES  DAILY   Alcohol Swabs (B-D SINGLE USE SWABS REGULAR) PADS Use to cleanse site prior to use of lancet to obtain droplet of blood two times per day; E10.49   aspirin 81 MG tablet Take 81 mg by mouth daily.   atorvastatin (LIPITOR) 10 MG tablet TAKE 1 TABLET EVERY DAY   Blood Glucose Monitoring Suppl (ACCU-CHEK GUIDE ME) w/Device KIT 1 each by Does not apply route See admin instructions. Use to monitor glucose levels 7 times daily; E11.9; WILL NOT COMPLETE PA   ciprofloxacin (CIPRO) 500 MG tablet Take 1 tablet (500 mg total) by mouth 2 (two) times daily for 10 days.   DROPLET INSULIN SYRINGE 29G X 1/2" 1 ML MISC USE IN THE MORNING AND AT BEDTIME   gabapentin (NEURONTIN) 300 MG capsule TAKE 2 CAPSULES THREE TIMES DAILY   glucose blood (ACCU-CHEK GUIDE) test strip TEST BLOOD SUGAR 7 TIMES DAILY   insulin isophane & regular human (NOVOLIN 70/30 FLEXPEN) (70-30) 100 UNIT/ML KwikPen Inject 10 Units into the skin 2 (two) times daily with a meal. With breakfast and supper if glucose is above 90 AND you are eating.   losartan (COZAAR) 50 MG tablet TAKE 1 TABLET EVERY DAY   mupirocin ointment (BACTROBAN) 2 % Apply 1 application topically 2 (two) times daily.   SHINGRIX injection    Tetrahydrozoline HCl (VISINE OP) Place 1 drop into both eyes daily as needed (redness).   triamcinolone cream (KENALOG) 0.1 % APPLY TO THE AFFECTED AREA(S) TOPICALLY THREE TIMES DAILY AS NEEDED FOR RASH   doxycycline (VIBRA-TABS) 100 MG tablet Take 1 tablet (100 mg total) by mouth 2 (two) times daily. (Patient not taking: Reported on 12/17/2020)   No facility-administered encounter medications on file as of 12/17/2020.    ALLERGIES: Allergies  Allergen Reactions   Penicillins Swelling    Swelling of the hands and feet, skin peeling Did it involve swelling of the  face/tongue/throat, SOB, or low BP? No Did it involve sudden or severe rash/hives, skin peeling, or any reaction on the inside of your mouth or nose? No Did you need to seek medical attention at a hospital or doctor's office?  Yes When did it last happen?      10 + years If all above answers are "NO", may proceed with cephalosporin use.    Ciprofloxacin Hives and Rash   Terbinafine Hcl Hives   Zestril [Lisinopril] Cough    VACCINATION STATUS: Immunization History  Administered Date(s) Administered   Influenza Whole 12/28/2008   Influenza,inj,Quad PF,6+ Mos 11/21/2013, 04/01/2015, 12/20/2015, 01/08/2017, 12/17/2017, 10/25/2018, 12/19/2019   Moderna Sars-Covid-2 Vaccination 04/29/2019, 05/27/2019, 12/29/2019   Pneumococcal Polysaccharide-23 06/11/2008   Td 09/29/2005   Tdap 10/15/2017   Zoster Recombinat (Shingrix) 06/14/2020, 08/25/2020    Diabetes He presents for his follow-up diabetic visit. He has type 2 diabetes mellitus. Onset time: was diagnosed at approx age of 71. His disease course has been fluctuating. There are no hypoglycemic associated symptoms. Associated symptoms include foot paresthesias. Pertinent negatives for diabetes include no blurred vision, no chest pain, no fatigue, no polydipsia, no polyphagia, no polyuria and no weight loss. There are no hypoglycemic complications. Symptoms are stable. Diabetic complications include heart disease, impotence, nephropathy, peripheral neuropathy and retinopathy. 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 Current diabetic treatment includes insulin injections. He is compliant with treatment most of the time. His weight is fluctuating minimally. He is following a generally healthy diet. When asked about meal planning, he reported none. He has not had a previous visit with a dietitian. He participates in  exercise three times a week. His home blood glucose trend is decreasing steadily. His breakfast blood glucose range is generally 130-140 mg/dl. His bedtime blood glucose range is generally 130-140 mg/dl. His overall blood glucose range is 130-140 mg/dl. (He presents today with his meter and logs showing stable glycemic profile.  His POCT A1c today is 7.5%, increasing from last visit of 7%.  He reports he was eating later than usual which caused his sugars to be higher, but has recently gotten back on track.  He denies any significant hypoglycemia.  Analysis of his meter shows 7-day average of 134, 14-day average of 153, 30-day average of 153, 90-day average of 164.) An ACE inhibitor/angiotensin II receptor blocker is being taken. He sees a podiatrist.Eye exam is current.  Hyperlipidemia This is a chronic problem. The current episode started more than 1 year ago. The problem is controlled. Recent lipid tests were reviewed and are normal. Exacerbating diseases include chronic renal disease and diabetes. There are no known factors aggravating his hyperlipidemia. Pertinent negatives include no chest pain. Current antihyperlipidemic treatment includes statins. The current treatment provides moderate improvement of lipids. There are no compliance problems.  Risk factors for coronary artery disease include diabetes mellitus, dyslipidemia, hypertension and male sex.  Hypertension This is a chronic problem. The current episode started more than 1 year ago. The problem has been gradually improving since onset. The problem is controlled. Pertinent negatives include no blurred vision or chest pain. There are no associated agents to hypertension. Risk factors for coronary artery disease include diabetes mellitus, dyslipidemia and male gender. Past treatments include angiotensin blockers. The current treatment provides moderate improvement. There are no compliance problems.  Hypertensive end-organ damage includes kidney  disease, heart failure and retinopathy. Identifiable causes of hypertension include chronic renal disease.    Review of systems  Constitutional: + Minimally fluctuating body weight,  current Body mass index is 30.21 kg/m. , no fatigue, no subjective hyperthermia, no subjective hypothermia Eyes: no blurry vision, no xerophthalmia ENT:  no sore throat, no nodules palpated in throat, no dysphagia/odynophagia, no hoarseness Cardiovascular: no chest pain, no shortness of breath, no palpitations, no leg swelling Respiratory: no cough, no shortness of breath Gastrointestinal: no nausea/vomiting/diarrhea Musculoskeletal: no muscle/joint aches Skin: no rashes, no hyperemia Neurological: no tremors, no numbness, no tingling, no dizziness Psychiatric: no depression, no anxiety  Objective:     BP (!) 151/68   Pulse 65   Ht _0  (1.803 m)   Wt 216 lb 9.6 oz (98.2 kg)   BMI 30.21 kg/m   Wt Readings from Last 3 Encounters:  12/17/20 216 lb 9.6 oz (98.2 kg)  08/06/20 215 lb 3.2 oz (97.6 kg)  05/28/20 219 lb (99.3 kg)     BP Readings from Last 3 Encounters:  12/17/20 (!) 151/68  08/06/20 124/75  05/28/20 137/78      Physical Exam- Limited  Constitutional:  Body mass index is 30.21 kg/m. , not in acute distress, normal state of mind Eyes:  EOMI, no exophthalmos Neck: Supple Cardiovascular: RRR, no murmurs, rubs, or gallops, no edema Respiratory: Adequate breathing efforts, no crackles, rales, rhonchi, or wheezing Musculoskeletal: no gross deformities, strength intact in all four extremities, no gross restriction of joint movements Skin:  no rashes, no hyperemia Neurological: no tremor with outstretched hands    CMP ( most recent) CMP     Component Value Date/Time   NA 143 12/10/2020 0847   K 4.5 12/10/2020 0847   CL 108 (H) 12/10/2020 0847   CO2 22 12/10/2020 0847   GLUCOSE 85 12/10/2020 0847   GLUCOSE 99 05/07/2020 0816   BUN 16 12/10/2020 0847   CREATININE 0.97  12/10/2020 0847   CREATININE 1.03 05/07/2020 0816   CALCIUM 9.6 12/10/2020 0847   PROT 7.6 12/10/2020 0847   ALBUMIN 4.4 12/10/2020 0847   AST 52 (H) 12/10/2020 0847   ALT 50 (H) 12/10/2020 0847   ALKPHOS 110 12/10/2020 0847   BILITOT 0.4 12/10/2020 0847   GFRNONAA 76 05/07/2020 0816   GFRAA 89 05/07/2020 0816     Diabetic Labs (most recent): Lab Results  Component Value Date   HGBA1C 7.5 (A) 12/17/2020   HGBA1C 7.0 08/06/2020   HGBA1C 9.7 (A) 04/09/2020     Lipid Panel ( most recent) Lipid Panel     Component Value Date/Time   CHOL 127 05/07/2020 0816   TRIG 71 05/07/2020 0816   HDL 47 05/07/2020 0816   CHOLHDL 2.7 05/07/2020 0816   VLDL 33.0 10/15/2017 0818   LDLCALC 65 05/07/2020 0816   LDLDIRECT 120.2 04/04/2013 0918      Lab Results  Component Value Date   TSH 1.10 10/15/2017   TSH 1.63 04/28/2016   TSH 0.99 05/21/2014   TSH 1.43 04/04/2013   TSH 1.28 11/13/2011   TSH 0.77 03/29/2010   TSH 0.95 09/01/2009   TSH 0.71 02/08/2009   TSH 0.75 02/07/2008           Assessment & Plan:   1) Uncontrolled Type 2 Diabetes with hyperglycemia  He presents today with his meter and logs showing stable glycemic profile.  His POCT A1c today is 7.5%, increasing from last visit of 7%.  He reports he was eating later than usual which caused his sugars to be higher, but has recently gotten back on track.  He denies any significant hypoglycemia.  Analysis of his meter shows 7-day average of 134, 14-day average of 153, 30-day average of 153, 90-day average of 164.  - [place]  has currently uncontrolled symptomatic type 2 DM since 65 years of age.  -Recent labs reviewed.  - I had a long discussion with him about the progressive nature of diabetes and the pathology behind its complications. -his diabetes is complicated by retinopathy, CKD, neuropathy, recent TMA and he remains at a high risk for more acute and chronic complications which include CAD, CVA, CKD,  retinopathy, and neuropathy. These are all discussed in detail with him.  - Nutritional counseling repeated at each appointment due to patients tendency to fall back in to old habits.  - The patient admits there is a room for improvement in their diet and drink choices. -  Suggestion is made for the patient to avoid simple carbohydrates from their diet including Cakes, Sweet Desserts / Pastries, Ice Cream, Soda (diet and regular), Sweet Tea, Candies, Chips, Cookies, Sweet Pastries, Store Bought Juices, Alcohol in Excess of 1-2 drinks a day, Artificial Sweeteners, Coffee Creamer, and "Sugar-free" Products. This will help patient to have stable blood glucose profile and potentially avoid unintended weight gain.   - I encouraged the patient to switch to unprocessed or minimally processed complex starch and increased protein intake (animal or plant source), fruits, and vegetables.   - Patient is advised to stick to a routine mealtimes to eat 3 meals a day and avoid unnecessary snacks (to snack only to correct hypoglycemia).  - I have approached him with the following individualized plan to manage  his diabetes and patient agrees:   -He has responded well with switching to premixed insulin and is advised to continue his current dose of 70/30 at 10 units twice daily with meals if glucose above 90 and he is eating.    -he is encouraged to continue monitoring blood glucose 3 times daily, before injecting insulin (at breakfast and supper) and before bed, and to call the clinic if he has readings less than 70 or greater than 300 for 3 tests in a row.   - he is warned not to take insulin without proper monitoring per orders. - Adjustment parameters are given to him for hypo and hyperglycemia in writing.  - Specific targets for  A1c;  LDL, HDL,  and Triglycerides were discussed with the patient.  2) Blood Pressure /Hypertension:  his blood pressure is not controlled to target.  He had not long taken his  medications prior to his appointment today. he is advised to continue his current medications including Cozaar 50 mg p.o. daily with breakfast.  3) Lipids/Hyperlipidemia:    Review of his recent lipid panel from 05/07/20 showed controlled  LDL at 65 .  he  is advised to continue Lipitor 10 mg daily at bedtime.  Side effects and precautions discussed with him.  Will recheck lipid panel prior to next visit.  4)  Weight/Diet:  his Body mass index is 30.21 kg/m.  -   clearly complicating his diabetes care.   he is a candidate for weight loss. I discussed with him the fact that loss of 5 - 10% of his  current body weight will have the most impact on his diabetes management.  Exercise, and detailed carbohydrates information provided  -  detailed on discharge instructions.  5) Chronic Care/Health Maintenance: -he is on ACEI/ARB and Statin medications and is encouraged to initiate and continue to follow up with Ophthalmology, Dentist, Podiatrist at least yearly or according to recommendations, and advised to stay away from smoking. I have recommended yearly flu vaccine and pneumonia vaccine at least  every 5 years; moderate intensity exercise for up to 150 minutes weekly; and sleep for at least 7 hours a day.  - he is advised to maintain close follow up with [name], MD for primary care needs, as well as his other providers for optimal and coordinated care.      I spent 30 minutes in the care of the patient today including review of labs from [facility], Lipids, Thyroid Function, Hematology (current and previous including abstractions from other facilities); face-to-face time discussing  his blood glucose readings/logs, discussing hypoglycemia and hyperglycemia episodes and symptoms, medications doses, his options of short and long term treatment based on the latest standards of care / guidelines;  discussion about incorporating lifestyle medicine;  and documenting the encounter.    Please refer to  Patient Instructions for Blood Glucose Monitoring and Insulin/Medications Dosing Guide"  in media tab for additional information. Please  also refer to " Patient Self Inventory" in the Media  tab for reviewed elements of pertinent patient history.  [place] participated in the discussions, expressed understanding, and voiced agreement with the above plans.  All questions were answered to his satisfaction. he is encouraged to contact clinic should he have any questions or concerns prior to his return visit.   Follow up plan: - Return in about 4 months (around 04/19/2021) for Diabetes F/U- A1c and UM in office, Previsit labs, Bring meter and logs.  [name], [facility] [address] [place], [place] [address] Phone: [phone] Fax: [phone]  12/17/2020, 8:53 AM

## 2020-12-24 ENCOUNTER — Ambulatory Visit: Payer: Medicare HMO | Admitting: Podiatry

## 2020-12-24 ENCOUNTER — Other Ambulatory Visit: Payer: Self-pay

## 2020-12-24 DIAGNOSIS — E1149 Type 2 diabetes mellitus with other diabetic neurological complication: Secondary | ICD-10-CM | POA: Diagnosis not present

## 2020-12-24 DIAGNOSIS — L84 Corns and callosities: Secondary | ICD-10-CM

## 2020-12-24 DIAGNOSIS — L97522 Non-pressure chronic ulcer of other part of left foot with fat layer exposed: Secondary | ICD-10-CM | POA: Diagnosis not present

## 2020-12-25 NOTE — Progress Notes (Signed)
Subjective: 65 year old male presents the office today for Evaluation of a wound on his left third toe.  He states that he is doing much better.  No pain, swelling, redness or any drainage and he feels the wound is healed.  He has no fevers or chills that he reports.  He has not yet finished the course of doxycycline.  He has no other concerns today.   Objective: AAO x3, NAD DP/PT pulses palpable bilaterally, CRT less than 3 seconds Hammertoes present.  Mild hyperkeratotic tissue present at the distal aspect left third toe.  Upon debridement there is no underlying ulceration drainage or any signs of infection.  No blistering.  No signs of abscess.  Trace edema to the toe but there is no erythema.  No other open lesions identified or preulcerative lesions No pain with calf compression, swelling, warmth, erythema  Assessment: Ulceration left third toe, abscess-much improved  Plan: -All treatment options discussed with the patient including all alternatives, risks, complications.  -Sharp debrided the hyperkeratotic tissue to the and complications or bleeding.  Appears the wound, infection is significantly improved.  Continue offloading.  Discussed daily foot inspection.  Return in about 4 weeks (around 01/21/2021).  Trula Slade DPM

## 2020-12-27 ENCOUNTER — Other Ambulatory Visit: Payer: Self-pay | Admitting: Family Medicine

## 2020-12-29 ENCOUNTER — Telehealth: Payer: Self-pay | Admitting: Pharmacist

## 2020-12-29 NOTE — Progress Notes (Signed)
Chronic Care Management Pharmacy Assistant   Name: Aaron Fox  MRN: [id number] DOB: 12-Nov-1955  Reason for Encounter: Disease State For HTN.    Conditions to be addressed/monitored: HTN, Type I Diabetes w/ Neuropathy, Hypercholesterolemia  Recent office visits:  None since 12/13/20  Recent consult visits:  12/24/20 Podiatry Orland Dec, DPM. For foot ulcer. No medication changes. 12/17/20 Endo Brita Romp, NP. For follow-up. STOPPED Doxycyline.   Hospital visits:  None since 12/13/20  Medications: Outpatient Encounter Medications as of 12/29/2020  Medication Sig   Accu-Chek Softclix Lancets lancets TEST  7  TIMES  DAILY   Alcohol Swabs (B-D SINGLE USE SWABS REGULAR) PADS Use to cleanse site prior to use of lancet to obtain droplet of blood two times per day; E10.49   aspirin 81 MG tablet Take 81 mg by mouth daily.   atorvastatin (LIPITOR) 10 MG tablet TAKE 1 TABLET EVERY DAY   Blood Glucose Monitoring Suppl (ACCU-CHEK GUIDE ME) w/Device KIT 1 each by Does not apply route See admin instructions. Use to monitor glucose levels 7 times daily; E11.9; WILL NOT COMPLETE PA   DROPLET INSULIN SYRINGE 29G X 1/2" 1 ML MISC USE IN THE MORNING AND AT BEDTIME   gabapentin (NEURONTIN) 300 MG capsule TAKE 2 CAPSULES THREE TIMES DAILY   glucose blood (ACCU-CHEK GUIDE) test strip TEST BLOOD SUGAR 7 TIMES DAILY   insulin isophane & regular human (NOVOLIN 70/30 FLEXPEN) (70-30) 100 UNIT/ML KwikPen Inject 10 Units into the skin 2 (two) times daily with a meal. With breakfast and supper if glucose is above 90 AND you are eating.   losartan (COZAAR) 50 MG tablet TAKE 1 TABLET EVERY DAY   mupirocin ointment (BACTROBAN) 2 % Apply 1 application topically 2 (two) times daily.   SHINGRIX injection    Tetrahydrozoline HCl (VISINE OP) Place 1 drop into both eyes daily as needed (redness).   triamcinolone cream (KENALOG) 0.1 % APPLY TO THE AFFECTED AREA(S) TOPICALLY THREE TIMES DAILY AS  NEEDED FOR RASH   No facility-administered encounter medications on file as of 12/29/2020.   Reviewed chart prior to disease state call. Spoke with patient regarding BP  Recent Office Vitals: BP Readings from Last 3 Encounters:  12/17/20 (!) 151/68  08/06/20 124/75  05/28/20 137/78   Pulse Readings from Last 3 Encounters:  12/17/20 65  08/06/20 60  05/28/20 (!) 57    Wt Readings from Last 3 Encounters:  12/17/20 216 lb 9.6 oz (98.2 kg)  08/06/20 215 lb 3.2 oz (97.6 kg)  05/28/20 219 lb (99.3 kg)     Kidney Function Lab Results  Component Value Date/Time   CREATININE 0.97 12/10/2020 08:47 AM   CREATININE 1.03 05/07/2020 08:16 AM   CREATININE 1.10 05/05/2019 12:36 PM   GFR 79.79 10/15/2017 08:18 AM   GFRNONAA 76 05/07/2020 08:16 AM   GFRAA 89 05/07/2020 08:16 AM    BMP Latest Ref Rng & Units 12/10/2020 05/07/2020 05/05/2019  Glucose 70 - 99 mg/dL 85 99 71  BUN 8 - 27 mg/dL 16 19 18   Creatinine 0.76 - 1.27 mg/dL 0.97 1.03 1.10  BUN/Creat Ratio 10 - 24 16 NOT APPLICABLE NOT APPLICABLE  Sodium 262 - 144 mmol/L 143 140 141  Potassium 3.5 - 5.2 mmol/L 4.5 4.4 4.4  Chloride 96 - 106 mmol/L 108(H) 109 108  CO2 20 - 29 mmol/L 22 22 23   Calcium 8.6 - 10.2 mg/dL 9.6 9.5 9.4    Current antihypertensive regimen:  Losartan 12m  daily  How often are you checking your Blood Pressure? Patient stated he doesn't have a blood pressure machine.   Current home BP readings: Patient stated he doesn't have a blood pressure machine.   What recent interventions/DTPs have been made by any provider to improve Blood Pressure control since last CPP Visit: None.   Any recent hospitalizations or ED visits since last visit with CPP? Patient stated no.   What diet changes have been made to improve Blood Pressure Control?  Patient stated his diet is okay he is in the process of improving his food intake.   What exercise is being done to improve your Blood Pressure Control?  Patient stated he  stated stays active on a daily basics.  Adherence Review: Is the patient currently on ACE/ARB medication? Losartan 50 mg   Does the patient have >5 day gap between last estimated fill dates? Per misc rpts, no.   Care Gaps:Patient is due for eye exam.   Star Rating Drugs:Losartan 50 mg 10/07/20 90 DS.   Patient stated he received his new blood sugar machine and he is actively using it and it works great.   Follow-Up:Pharmacist Review  Charlann Lange, [place] Pharmacist Assistant [phone]

## 2021-01-10 ENCOUNTER — Ambulatory Visit: Payer: Medicare HMO | Admitting: Podiatry

## 2021-01-12 ENCOUNTER — Other Ambulatory Visit: Payer: Self-pay | Admitting: Endocrinology

## 2021-01-12 DIAGNOSIS — E11319 Type 2 diabetes mellitus with unspecified diabetic retinopathy without macular edema: Secondary | ICD-10-CM

## 2021-01-28 ENCOUNTER — Ambulatory Visit: Payer: Medicare HMO | Admitting: Podiatry

## 2021-02-04 NOTE — Progress Notes (Signed)
Chronic Care Management Pharmacy Note  02/10/2021 Name:  Aaron Fox MRN:  [id number] DOB:  [birth year]  Subjective: Aaron Fox is an 65 y.o. year old male who is a primary patient of Pickard, Cammie Mcgee, MD.  The CCM team was consulted for assistance with disease management and care coordination needs.    Engaged with patient by telephone for follow up visit in response to provider referral for pharmacy case management and/or care coordination services.   Consent to Services:  The patient was given the following information about Chronic Care Management services today, agreed to services, and gave verbal consent: 1. CCM service includes personalized support from designated clinical staff supervised by the primary care provider, including individualized plan of care and coordination with other care providers 2. 24/7 contact phone numbers for assistance for urgent and routine care needs. 3. Service will only be billed when office clinical staff spend 20 minutes or more in a month to coordinate care. 4. Only one practitioner may furnish and bill the service in a calendar month. 5.The patient may stop CCM services at any time (effective at the end of the month) by phone call to the office staff. 6. The patient will be responsible for cost sharing (co-pay) of up to 20% of the service fee (after annual deductible is met). Patient agreed to services and consent obtained.  Patient Care Team: [name], MD as PCP - General (Family Medicine) Edythe Clarity, [facility] as Pharmacist (Pharmacist)  Recent office visits:  None since 12/13/20  Recent consult visits:  12/24/20 Podiatry Orland Dec, DPM. For foot ulcer. No medication changes. 12/17/20 Endo Brita Romp, NP. For follow-up. STOPPED Doxycyline.    Hospital visits:  None since 12/13/20  Objective:  Lab Results  Component Value Date   CREATININE 0.97 12/10/2020   BUN 16 12/10/2020   GFR 79.79 10/15/2017    GFRNONAA 76 05/07/2020   GFRAA 89 05/07/2020   NA 143 12/10/2020   K 4.5 12/10/2020   CALCIUM 9.6 12/10/2020   CO2 22 12/10/2020   GLUCOSE 85 12/10/2020    Lab Results  Component Value Date/Time   HGBA1C 7.5 (A) 12/17/2020 08:42 AM   HGBA1C 7.0 08/06/2020 08:47 AM   GFR 79.79 10/15/2017 08:18 AM   GFR 85.11 04/28/2016 08:04 AM   MICROALBUR 7.9 05/07/2020 08:16 AM   MICROALBUR 17.5 (H) 10/15/2017 08:18 AM    Last diabetic Eye exam:  Lab Results  Component Value Date/Time   HMDIABEYEEXA Retinopathy (A) 03/21/2019 11:16 AM    Last diabetic Foot exam:  Lab Results  Component Value Date/Time   HMDIABFOOTEX yes 06/15/2008 12:00 AM     Lab Results  Component Value Date   CHOL 127 05/07/2020   HDL 47 05/07/2020   LDLCALC 65 05/07/2020   LDLDIRECT 120.2 04/04/2013   TRIG 71 05/07/2020   CHOLHDL 2.7 05/07/2020    Hepatic Function Latest Ref Rng & Units 12/10/2020 05/07/2020 04/21/2019  Total Protein 6.0 - 8.5 g/dL 7.6 7.4 8.0  Albumin 3.8 - 4.8 g/dL 4.4 - -  AST 0 - 40 IU/L 52(H) 84(H) 44(H)  ALT 0 - 44 IU/L 50(H) 53(H) 23  Alk Phosphatase 44 - 121 IU/L 110 - -  Total Bilirubin 0.0 - 1.2 mg/dL 0.4 0.5 0.7  Bilirubin, Direct 0.0 - 0.3 mg/dL - - -    Lab Results  Component Value Date/Time   TSH 1.10 10/15/2017 08:18 AM   TSH 1.63 04/28/2016 08:04 AM  CBC Latest Ref Rng & Units 05/07/2020 05/05/2019 04/26/2019  WBC 3.8 - 10.8 Thousand/uL 3.5(L) 4.5 5.1  Hemoglobin 13.2 - 17.1 g/dL 12.5(L) 10.5(L) 10.2(L)  Hematocrit 38.5 - 50.0 % 38.5 31.2(L) 30.6(L)  Platelets 140 - 400 Thousand/uL 240 244 168    No results found for: VD25OH  Clinical ASCVD: No  The ASCVD Risk score (Arnett DK, et al., 2019) failed to calculate for the following reasons:   The valid total cholesterol range is 130 to 320 mg/dL    Depression screen [facility] 2/9 08/27/2020 05/07/2020  Decreased Interest 0 0  Down, Depressed, Hopeless 0 0  PHQ - 2 Score 0 0  Some recent data might be hidden      Social History   Tobacco Use  Smoking Status Former   Types: Cigarettes   Quit date: 11/02/1999   Years since quitting: 21.2  Smokeless Tobacco Never   BP Readings from Last 3 Encounters:  12/17/20 (!) 151/68  08/06/20 124/75  05/28/20 137/78   Pulse Readings from Last 3 Encounters:  12/17/20 65  08/06/20 60  05/28/20 (!) 57   Wt Readings from Last 3 Encounters:  12/17/20 216 lb 9.6 oz (98.2 kg)  08/06/20 215 lb 3.2 oz (97.6 kg)  05/28/20 219 lb (99.3 kg)   BMI Readings from Last 3 Encounters:  12/17/20 30.21 kg/m  08/06/20 30.01 kg/m  05/28/20 30.54 kg/m    Assessment/Interventions: Review of patient past medical history, allergies, medications, health status, including review of consultants reports, laboratory and other test data, was performed as part of comprehensive evaluation and provision of chronic care management services.   SDOH:  (Social Determinants of Health) assessments and interventions performed: Yes  SDOH Screenings   Alcohol Screen: Low Risk    Last Alcohol Screening Score (AUDIT): 0  Depression (PHQ2-9): Low Risk    PHQ-2 Score: 0  Financial Resource Strain: Low Risk    Difficulty of Paying Living Expenses: Not hard at all  Food Insecurity: No Food Insecurity   Worried About Charity fundraiser in the Last Year: Never true   Ran Out of Food in the Last Year: Never true  Housing: Low Risk    Last Housing Risk Score: 0  Physical Activity: Sufficiently Active   Days of Exercise per Week: 7 days   Minutes of Exercise per Session: 30 min  Social Connections: Moderately Integrated   Frequency of Communication with Friends and Family: More than three times a week   Frequency of Social Gatherings with Friends and Family: Three times a week   Attends Religious Services: More than 4 times per year   Active Member of Clubs or Organizations: No   Attends Archivist Meetings: Never   Marital Status: Married  Stress: No Stress Concern  Present   Feeling of Stress : Not at all  Tobacco Use: Medium Risk   Smoking Tobacco Use: Former   Smokeless Tobacco Use: Never   Passive Exposure: Not on file  Transportation Needs: No Transportation Needs   Lack of Transportation (Medical): No   Lack of Transportation (Non-Medical): No    CCM Care Plan  Allergies  Allergen Reactions   Penicillins Swelling    Swelling of the hands and feet, skin peeling Did it involve swelling of the face/tongue/throat, SOB, or low BP? No Did it involve sudden or severe rash/hives, skin peeling, or any reaction on the inside of your mouth or nose? No Did you need to seek medical attention at a  hospital or doctor's office? Yes When did it last happen?      10 + years If all above answers are "NO", may proceed with cephalosporin use.    Ciprofloxacin Hives and Rash   Terbinafine Hcl Hives   Zestril [Lisinopril] Cough    Medications Reviewed Today     Reviewed by Edythe Clarity, [facility] (Pharmacist) on 02/10/21 at 1414  Med List Status: <None>   Medication Order Taking? Sig Documenting Provider Last Dose Status Informant  Accu-Chek Softclix Lancets lancets [id number] Yes USE TO MONITOR GLUCOSE LEVELS 7 TIMES DAILY Renato Shin, MD Taking Active   Alcohol Swabs (B-D SINGLE USE SWABS REGULAR) PADS [id number] Yes Use to cleanse site prior to use of lancet to obtain droplet of blood two times per day; E10.49 Renato Shin, MD Taking Active Self  aspirin 81 MG tablet [id number] Yes Take 81 mg by mouth daily. [provider] Taking Active Self  atorvastatin (LIPITOR) 10 MG tablet [id number] Yes TAKE 1 TABLET EVERY DAY Pickard, Cammie Mcgee, MD Taking Active   Blood Glucose Monitoring Suppl (ACCU-CHEK GUIDE ME) w/Device KIT [id number] Yes 1 each by Does not apply route See admin instructions. Use to monitor glucose levels 7 times daily; E11.9; WILL NOT COMPLETE PA [name], MD Taking Active   DROPLET INSULIN SYRINGE 29G X 1/2" 1 ML MISC  [id number] Yes USE IN THE MORNING AND AT BEDTIME Renato Shin, MD Taking Active   gabapentin (NEURONTIN) 300 MG capsule [id number] Yes TAKE 2 CAPSULES THREE TIMES DAILY [name], MD Taking Active   glucose blood (ACCU-CHEK GUIDE) test strip [id number] Yes TEST BLOOD SUGAR 7 TIMES DAILY [name], MD Taking Active   insulin isophane & regular human (NOVOLIN 70/30 FLEXPEN) (70-30) 100 UNIT/ML KwikPen [id number] Yes Inject 10 Units into the skin 2 (two) times daily with a meal. With breakfast and supper if glucose is above 90 AND you are eating. Brita Romp, NP Taking Active   losartan (COZAAR) 50 MG tablet [id number] Yes TAKE 1 TABLET EVERY DAY [name], MD Taking Active   mupirocin ointment (BACTROBAN) 2 % [id number] Yes Apply 1 application topically 2 (two) times daily. Trula Slade, DPM Taking Active   [facility] injection [id number] Yes  [provider] Taking Active   Tetrahydrozoline HCl (VISINE OP) [id number] Yes Place 1 drop into both eyes daily as needed (redness). [provider] Taking Active Self  triamcinolone cream (KENALOG) 0.1 % [id number] Yes APPLY TO THE AFFECTED AREA(S) TOPICALLY THREE TIMES DAILY AS NEEDED FOR Baldo Ash, MD Taking Active             Patient Active Problem List   Diagnosis Date Noted   Osteomyelitis ([place]) 04/25/2019   Diabetic retinopathy of both eyes ([place])    Low back pain 12/17/2017   Irritant contact dermatitis 09/21/2016   Cervical radiculitis 02/08/2016   Hx of adenomatous colonic polyps 06/09/2015   Dyspnea on exertion 05/03/2015   Lymphadenopathy, retroperitoneal 09/17/2014   PSA elevation 05/27/2014   Iron deficiency anemia 05/27/2014   Wellness examination 04/20/2014   Diabetic foot ulcer ([place]) 11/21/2013   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[place]) 00/76/2263   Piriformis syndrome of left side 08/25/2013   Dyspnea 07/24/2013   Knee pain 07/24/2013   Routine  general medical examination at a health care facility 07/24/2013   Numbness and tingling in hands 10/14/2012   Encounter for long-term (current) use of other medications 11/13/2011  Disturbance of skin sensation 11/13/2011   Screening for prostate cancer 11/13/2011   Inflammatory and toxic neuropathy, 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   Shoulder pain, right 11/13/2011   BALANITIS 09/01/2009   HYPOKALEMIA 05/31/2009   Essential hypertension 05/31/2009   LEG PAIN, BILATERAL 02/15/2009   HYPERCHOLESTEROLEMIA 07/02/2008   ALLERGIC RHINITIS 07/02/2008   CHF 06/19/2008   MICROSCOPIC HEMATURIA 06/19/2008   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   Hypogonadism male 01/15/2007   Diabetes ([place]) 09/22/2006   Gout 09/22/2006   RHEUMATOID ARTHRITIS 09/22/2006    Immunization History  Administered Date(s) Administered   Influenza Whole 12/28/2008   Influenza,inj,Quad PF,6+ Mos 11/21/2013, 04/01/2015, 12/20/2015, 01/08/2017, 12/17/2017, 10/25/2018, 12/19/2019   Moderna Sars-Covid-2 Vaccination 04/29/2019, 05/27/2019, 12/29/2019   Pneumococcal Polysaccharide-23 06/11/2008   Td 09/29/2005   Tdap 10/15/2017   Zoster Recombinat (Shingrix) 06/14/2020, 08/25/2020    Conditions to be addressed/monitored:  HTN, Type I Diabetes w/ Neuropathy, Hypercholesterolemia  Care Plan : General Pharmacy (Adult)  Updates made by Edythe Clarity, RPH since 02/10/2021 12:00 AM     Problem: HTN, DM, HLD   Priority: High  Onset Date: 06/11/2020     Long-Range Goal: Patient-Specific Goal   Start Date: 06/11/2020  Expected End Date: 12/11/2020  Recent Progress: On track  Priority: High  Note:   Current Barriers:  Unable to independently monitor therapeutic efficacy Unable to achieve control of glucose   Pharmacist Clinical Goal(s):  Patient will verbalize ability to afford treatment regimen achieve adherence to monitoring guidelines and medication adherence to achieve therapeutic efficacy adhere to prescribed  medication regimen as evidenced by fill dates contact provider office for questions/concerns as evidenced notation of same in electronic health record through collaboration with PharmD and provider.   Interventions: 1:1 collaboration with [name], MD regarding development and update of comprehensive plan of care as evidenced by provider attestation and co-signature Inter-disciplinary care team collaboration (see longitudinal plan of care) Comprehensive medication review performed; medication list updated in electronic medical record  Hypertension (BP goal <130/80) -Controlled -Current treatment: Losartan 16m daily -Medications previously tried: none noted  -Current home readings: does not have meter at home -Current dietary habits: he has made good changes in diet after recent A1c, always bakes food, mostly water intake -Current exercise habits: very active, 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 Does not check BP at home. Encouraged him to remain active. Last BP in office was elevated, otherwise has been controlled. Continue to monitor for now. No changes in meds needed.  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%) -Controlled -Current medications: Novolin 70/30 10 units BID -Medications previously tried: Novolin R -Current home glucose readings fasting glucose: 130-160 post prandial glucose:  -Denies hypoglycemic/hyperglycemic symptoms -Current meal patterns: baked instead of fried, eats a lot of salads, no soda intake -Current exercise: see above -Educated on A1c and blood sugar goals; Complications of diabetes including kidney damage, retinal damage, and cardiovascular disease; Exercise goal of 150 minutes per week; Prevention and management of hypoglycemic episodes; Benefits of routine self-monitoring of blood sugar;  -He recently stopped meal time insulin due to hypoglycemia.  He reports only one episode of hypoglycemia since then when he went a long time without eating. -Reports that his increased A1c was due to him not controlling his diet like he was previously. -Counseled to check feet daily and get yearly eye exams -Recommended to continue current medication Recommended he eat consistent meals throughout the day to avoid hyoglycemia.  Encouraged him to get back on track with diet and get A1c to goal.  He is motivated to do so.  Update 10/07/20 Changed to Novoling 70/30 10 units bid A1c improved to 7.0! Fasting Sugars - 90-150s He is now back to watching what he eats and sugars are much more controlled. Congratulated on efforts and lifestyle mods. Continue current meds for now.  Update 02/10/21 Fasting Sugars - 126 this morning,  Continues to lay off the  sweets Remains on 10 units BID of 70/30 insulin. Denies any hypoglycemia. Currently he states his biggest concern is that his portion sizes are too large. Suggested he use a smaller plate as this sometimes helps you decrease portion size. Last POC A1c was 7.5 which increased from 7.0. Encouraged him to work on on diet/exercise.   He is motivated to get on track and is agreeable to plan.    Patient Goals/Self-Care Activities Patient will:  - take medications as prescribed check glucose daily, document, and provide at future appointments check blood pressure if able, document, and provide at future appointments target a minimum of 150 minutes of moderate intensity exercise weekly  Follow Up Plan: The care management team will reach out to the patient again over the next 90 days.              Medication Assistance: None required.  Patient affirms current coverage meets needs.  Patient's preferred pharmacy is:  [place], [name] - [address] [name] [address] Phone: [phone] Fax: [phone]  [place], [place] [address] Phone: [phone] Fax: [phone]  Uses pill box? No - does not miss doses Pt endorses 100% compliance  We discussed: Benefits of medication synchronization, packaging and delivery as well as enhanced pharmacist oversight with [facility]. Patient decided to: Utilize [facility] pharmacy for medication synchronization, packaging and delivery  Care Plan and Follow Up Patient Decision:  Patient agrees to Care Plan and Follow-up.  Plan: The care management team will reach out to the patient again over the next 90 days.  Beverly Milch, PharmD Clinical Pharmacist [place] [phone]

## 2021-02-10 ENCOUNTER — Ambulatory Visit (INDEPENDENT_AMBULATORY_CARE_PROVIDER_SITE_OTHER): Payer: Medicare HMO | Admitting: Pharmacist

## 2021-02-10 DIAGNOSIS — E1040 Type 1 diabetes mellitus with diabetic neuropathy, unspecified: Secondary | ICD-10-CM

## 2021-02-10 DIAGNOSIS — I10 Essential (primary) hypertension: Secondary | ICD-10-CM

## 2021-02-10 NOTE — Patient Instructions (Addendum)
Visit Information   Goals Addressed             This Visit's Progress    Monitor and Manage My Blood Sugar-Diabetes Type 1   On track    Timeframe:  Long-Range Goal Priority:  High Start Date:   06/11/20                          Expected End Date:  12/11/20                     Follow Up Date 12/27/20   - check blood sugar at prescribed times - check blood sugar if I feel it is too high or too low - enter blood sugar readings and medication or insulin into daily log - take the blood sugar log to all doctor visits    Why is this important?   Checking your blood sugar at home helps to keep it from getting very high or very low.  Writing the results in a diary or log helps the doctor know how to care for you.  Your blood sugar log should have the time, the date and the results.  Also, write down the amount of insulin or other medicine you take.  Other information like what you ate, exercise done and how you were feeling will also be helpful..     Notes: Goal fasting 80-130  10/07/20 - A1c improved to 7.0!!       Patient Care Plan: General Pharmacy (Adult)     Problem Identified: HTN, DM, HLD   Priority: High  Onset Date: 06/11/2020     Long-Range Goal: Patient-Specific Goal   Start Date: 06/11/2020  Expected End Date: 12/11/2020  Recent Progress: On track  Priority: High  Note:   Current Barriers:  Unable to independently monitor therapeutic efficacy Unable to achieve control of glucose   Pharmacist Clinical Goal(s):  Patient will verbalize ability to afford treatment regimen achieve adherence to monitoring guidelines and medication adherence to achieve therapeutic efficacy adhere to prescribed medication regimen as evidenced by fill dates contact provider office for questions/concerns as evidenced notation of same in electronic health record through collaboration with PharmD and provider.   Interventions: 1:1 collaboration with [name], MD regarding  development and update of comprehensive plan of care as evidenced by provider attestation and co-signature Inter-disciplinary care team collaboration (see longitudinal plan of care) Comprehensive medication review performed; medication list updated in electronic medical record  Hypertension (BP goal <130/80) -Controlled -Current treatment: Losartan 50mg  daily -Medications previously tried: none noted  -Current home readings: does not have meter at home -Current dietary habits: he has made good changes in diet after recent A1c, always bakes food, mostly water intake -Current exercise habits: very active, 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 Does  not check BP at home. Encouraged him to remain active. Last BP in office was elevated, otherwise has been controlled. Continue to monitor for now. No changes in meds needed.  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%) -Controlled -Current medications: Novolin 70/30 10 units BID -Medications previously tried: Novolin R -Current home glucose readings fasting glucose: 130-160 post prandial glucose:  -Denies hypoglycemic/hyperglycemic symptoms -Current meal patterns: baked instead of fried, eats a lot of salads, no soda intake -Current exercise: see above -Educated on A1c and blood sugar goals; Complications of diabetes including kidney damage, retinal damage, and cardiovascular disease; Exercise goal of 150 minutes per week; Prevention and management of hypoglycemic episodes; Benefits of routine self-monitoring of blood sugar;  -He recently stopped meal time insulin due to hypoglycemia.  He reports only one episode of hypoglycemia since then when he went a long time without eating. -Reports that his increased A1c was due to him not controlling his diet like he was previously. -Counseled to check feet daily and get yearly eye exams -Recommended to continue current medication Recommended he eat consistent meals throughout the day to avoid hyoglycemia.  Encouraged him to get back on track with diet and get A1c to goal.  He is motivated to do so.  Update 10/07/20 Changed to Novoling 70/30 10 units bid A1c improved to 7.0! Fasting Sugars - 90-150s He is now back to watching what he eats and sugars are much more controlled. Congratulated on efforts and lifestyle mods. Continue current meds for now.  Update 02/10/21 Fasting Sugars - 126 this morning,  Continues to lay off the sweets Remains on 10 units BID of 70/30 insulin. Denies any hypoglycemia. Currently he states his biggest concern is that his portion sizes are too large. Suggested he use a smaller plate as this sometimes helps you decrease portion size. Last POC A1c was 7.5 which  increased from 7.0. Encouraged him to work on on diet/exercise.   He is motivated to get on track and is agreeable to plan.    Patient Goals/Self-Care Activities Patient will:  - take medications as prescribed check glucose daily, document, and provide at future appointments check blood pressure if able, document, and provide at future appointments target a minimum of 150 minutes of moderate intensity exercise weekly  Follow Up Plan: The care management team will reach out to the patient again over the next 90 days.            Patient verbalizes understanding of instructions provided today and agrees to view in [place].  Telephone follow up appointment with pharmacy team member scheduled for: 4 months  Edythe Clarity, [place], PharmD, [place] Clinical Pharmacist Practitioner [place] [phone]

## 2021-02-13 ENCOUNTER — Other Ambulatory Visit: Payer: Self-pay | Admitting: Nurse Practitioner

## 2021-02-13 DIAGNOSIS — E1165 Type 2 diabetes mellitus with hyperglycemia: Secondary | ICD-10-CM

## 2021-02-26 DIAGNOSIS — I10 Essential (primary) hypertension: Secondary | ICD-10-CM

## 2021-02-26 DIAGNOSIS — E1040 Type 1 diabetes mellitus with diabetic neuropathy, unspecified: Secondary | ICD-10-CM | POA: Diagnosis not present

## 2021-03-28 ENCOUNTER — Encounter: Payer: Self-pay | Admitting: Family Medicine

## 2021-03-28 ENCOUNTER — Other Ambulatory Visit: Payer: Self-pay

## 2021-03-28 ENCOUNTER — Ambulatory Visit (INDEPENDENT_AMBULATORY_CARE_PROVIDER_SITE_OTHER): Payer: Medicare HMO | Admitting: Family Medicine

## 2021-03-28 VITALS — BP 138/72 | HR 74 | Temp 97.8°F | Resp 18 | Ht 71.0 in | Wt 225.0 lb

## 2021-03-28 DIAGNOSIS — M069 Rheumatoid arthritis, unspecified: Secondary | ICD-10-CM | POA: Diagnosis not present

## 2021-03-28 DIAGNOSIS — M25562 Pain in left knee: Secondary | ICD-10-CM | POA: Diagnosis not present

## 2021-03-28 DIAGNOSIS — M5412 Radiculopathy, cervical region: Secondary | ICD-10-CM

## 2021-03-28 DIAGNOSIS — Z89421 Acquired absence of other right toe(s): Secondary | ICD-10-CM | POA: Diagnosis not present

## 2021-03-28 DIAGNOSIS — Z89411 Acquired absence of right great toe: Secondary | ICD-10-CM | POA: Diagnosis not present

## 2021-03-28 DIAGNOSIS — I509 Heart failure, unspecified: Secondary | ICD-10-CM | POA: Diagnosis not present

## 2021-03-28 DIAGNOSIS — R091 Pleurisy: Secondary | ICD-10-CM

## 2021-03-28 DIAGNOSIS — Z23 Encounter for immunization: Secondary | ICD-10-CM | POA: Diagnosis not present

## 2021-03-28 MED ORDER — MELOXICAM 15 MG PO TABS: 15.0000 mg | ORAL_TABLET | Freq: Every day | ORAL | 0 refills | Status: DC

## 2021-03-28 NOTE — Progress Notes (Signed)
Subjective:    Patient ID: Aaron Fox, male    DOB: 1955/12/14, 66 y.o.   MRN: [id number]  HPI Patient is now seeing an endocrinologist.  He states that his sugars are in the 100s.  This morning it was 170 but typically it is better than that.  He denies any sugars in the 200s or 300s.  The reason this is important is because he is coming in with joint pain.  He has 3 concerns.  First he has developed a 2-week history of pain in his right ribs.  It hurts to cough.  It hurts to sneeze.  He denies any hemoptysis.  He denies any shortness of breath.  He denies any purulent sputum.  He denies any fevers or chills.  He is extremely tender to palpation over the ribs in the midaxillary line.  He has no CVA tenderness.  He denies any hematuria or dysuria.  He denies any relationship to food.  He denies any nausea or vomiting or diarrhea.  He also complains of bilateral knee pain left greater than right.  It hurts to stand and walk.  It hurts to squat.  He denies any laxity to varus or valgus stress.  He also complains of tingling burning-like pain radiating from the left side of his neck down his left arm into his hand.  He has a history of a pinched nerve per his report.  This got better in the past but is started to bother him again.  He is not taking any NSAIDs.  He has no history of chronic kidney disease.  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 Talbert Forest   CATARACT EXTRACTION, BILATERAL     COLONOSCOPY  2004   LYMPH NODE BIOPSY Right 10/16/2014   Procedure: RIGHT INGUINAL LYMPH NODE BIOPSY;  Surgeon: Aviva Signs, MD;  Location: [facility];  Service: General;  Laterality: Right;   Current Outpatient Medications on File Prior to Visit  Medication Sig Dispense Refill   Accu-Chek Softclix Lancets lancets USE TO MONITOR GLUCOSE LEVELS 7 TIMES DAILY 600 each 3   Alcohol Swabs (B-D SINGLE USE SWABS REGULAR) PADS Use to cleanse site prior to use of lancet to obtain droplet of blood two times per day; E10.49 200 each 2   aspirin 81 MG tablet Take 81  mg by mouth daily.     atorvastatin (LIPITOR) 10 MG tablet TAKE 1 TABLET EVERY DAY 90 tablet 0   Blood Glucose Monitoring Suppl (ACCU-CHEK GUIDE ME) w/Device KIT 1 each by Does not apply route See admin instructions. Use to monitor glucose levels 7 times daily; E11.9; WILL NOT COMPLETE PA 1 kit 0   DROPLET INSULIN SYRINGE 29G X 1/2" 1 ML MISC USE IN THE MORNING AND AT BEDTIME 200 each 3   gabapentin (NEURONTIN) 300 MG capsule TAKE 2 CAPSULES THREE TIMES DAILY 540 capsule 2   glucose blood (ACCU-CHEK GUIDE) test strip TEST BLOOD SUGAR 7 TIMES DAILY 600 strip 3   losartan (COZAAR) 50 MG tablet TAKE 1 TABLET EVERY DAY 90 tablet 1   mupirocin ointment (BACTROBAN) 2 % Apply 1 application topically 2 (two) times daily. 30 g 2   NOVOLIN 70/30 KWIKPEN (70-30) 100 UNIT/ML KwikPen INJECT 10  UNITS SUBCUTANEOUSLY TWICE DAILY WITH A MEAL (BREAKFAST AND SUPPER IF GLUCOSE IS ABOVE 90 AND YOU ARE EATING) 15 mL 1   Tetrahydrozoline HCl (VISINE OP) Place 1 drop into both eyes daily as needed (redness).     triamcinolone cream (KENALOG) 0.1 % APPLY TO THE AFFECTED AREA(S) TOPICALLY THREE TIMES DAILY AS NEEDED FOR RASH 45 g 2   No current facility-administered medications on file prior to visit.   Allergies  Allergen Reactions   Penicillins Swelling    Swelling of the hands and feet, skin peeling Did it involve swelling of the face/tongue/throat, SOB, or low BP? No Did it involve sudden or severe rash/hives, skin peeling, or any reaction on the inside of your mouth or nose? No Did you need to seek medical attention at a hospital or doctor's office? Yes When did it last happen?      10 + years If all above answers are "NO", may proceed with cephalosporin use.    Ciprofloxacin Hives and Rash   Terbinafine Hcl Hives   Zestril [Lisinopril] Cough   Social History   Socioeconomic History   Marital status: Married    Spouse name: Not on file   Number of children: 3   Years of education: Not on file   Highest education level: Not on file  Occupational History   Occupation: Disability    Employer: [employer]  Tobacco Use   Smoking status: Former    Types: Cigarettes    Quit date: 11/02/1999    Years since quitting: 21.4   Smokeless tobacco: Never  Vaping Use   Vaping Use: Never used  Substance and Sexual Activity   Alcohol use: No    Alcohol/week: 0.0 standard drinks   Drug use: No   Sexual activity: Not on file  Other Topics Concern   Not on file  Social History Narrative   ** Merged History Encounter **       Married, 3 kids Prior work [employer] before disability Regular exercise-yes   Social Determinants of Radio broadcast assistant Strain: Low Risk    Difficulty of Paying Living Expenses: Not hard at all  Food Insecurity: No Food Insecurity   Worried About  Charity fundraiser in the Last Year: Never true   Arboriculturist in the Last Year: Never true  Transportation Needs: No Transportation Needs   Lack of Transportation (Medical): No   Lack of Transportation (Non-Medical): No  Physical Activity: Sufficiently Active   Days of Exercise per Week: 7 days   Minutes of Exercise per  Session: 30 min  Stress: No Stress Concern Present   Feeling of Stress : Not at all  Social Connections: Moderately Integrated   Frequency of Communication with Friends and Family: More than three times a week   Frequency of Social Gatherings with Friends and Family: Three times a week   Attends Religious Services: More than 4 times per year   Active Member of Clubs or Organizations: No   Attends Archivist Meetings: Never   Marital Status: Married  Human resources officer Violence: Not At Risk   Fear of Current or Ex-Partner: No   Emotionally Abused: No   Physically Abused: No   Sexually Abused: No     Review of Systems  All other systems reviewed and are negative.     Objective:   Physical Exam Vitals reviewed.  Constitutional:      General: He is not in acute distress.    Appearance: Normal appearance. He is normal weight. He is not ill-appearing, toxic-appearing or diaphoretic.  Cardiovascular:     Rate and Rhythm: Normal rate and regular rhythm.     Heart sounds: Normal heart sounds. No murmur heard. Pulmonary:     Effort: Pulmonary effort is normal. No respiratory distress.     Breath sounds: Normal breath sounds. No stridor. No wheezing, rhonchi or rales.  Chest:     Chest wall: Tenderness present. No deformity, swelling or crepitus.    Abdominal:     General: Abdomen is flat. Bowel sounds are normal.     Palpations: Abdomen is soft.  Musculoskeletal:     Cervical back: Decreased range of motion.       Back:     Right knee: Decreased range of motion. Tenderness present.     Left knee: Decreased range of motion. Tenderness present.           Assessment & Plan:  Pleurisy - Plan: DG Ribs Unilateral Right, DG Chest 2 View  Left cervical radiculopathy  Acute pain of left knee First, regarding the pleurisy, I suspect the patient has a cracked rib or torn intercostal muscle.  Begin by obtaining x-rays of the ribs and chest to evaluate further.  This does not appear to be GI or GU related.  It certainly sounds musculoskeletal has movement and cough and twisting and sneezing makes it worse.  Second regarding his knee pain I believe is due to osteoarthritis.  We will try meloxicam 15 mg daily but he would like to try cortisone shot in his left knee given the pain.  Using sterile technique, I injected the left knee with 3 cc lidocaine, 2 cc of Marcaine, and 2 cc of 40 mg/mL Kenalog.  He tolerated procedure well without complication.  Third I believe the pain in his left side of his neck radiating down his arm his cervical radiculopathy likely due to degenerative disc disease.  We will trial meloxicam and if no better over 2 weeks recommend x-rays of the neck to evaluate further.

## 2021-03-31 ENCOUNTER — Telehealth: Payer: Self-pay | Admitting: Pharmacist

## 2021-03-31 ENCOUNTER — Ambulatory Visit
Admission: RE | Admit: 2021-03-31 | Discharge: 2021-03-31 | Disposition: A | Discharge status: Home / Self Care | Payer: Medicare HMO | Source: Ambulatory Visit | Attending: Family Medicine | Admitting: Family Medicine

## 2021-03-31 DIAGNOSIS — R091 Pleurisy: Secondary | ICD-10-CM

## 2021-03-31 DIAGNOSIS — J42 Unspecified chronic bronchitis: Secondary | ICD-10-CM | POA: Diagnosis not present

## 2021-03-31 DIAGNOSIS — R918 Other nonspecific abnormal finding of lung field: Secondary | ICD-10-CM | POA: Diagnosis not present

## 2021-03-31 DIAGNOSIS — R0781 Pleurodynia: Secondary | ICD-10-CM | POA: Diagnosis not present

## 2021-03-31 DIAGNOSIS — M47814 Spondylosis without myelopathy or radiculopathy, thoracic region: Secondary | ICD-10-CM | POA: Diagnosis not present

## 2021-03-31 NOTE — Progress Notes (Addendum)
Chronic Care Management Pharmacy Assistant   Name: Aaron Fox  MRN: [id number] DOB: 08/28/1955   Reason for Encounter: Disease State - Diabetes Call     Recent office visits:  03/28/21 Jenna Luo, MD - Family Medicine - Pleurisy - Chest xray ordered. Meloxicam (MOBIC) 15 MG tablet prescribed. Flu vaccine updated. Follow up in 2 weeks if no improvement.   Recent consult visits:  None noted.   Hospital visits:  None in previous 6 months  Medications: Outpatient Encounter Medications as of 03/31/2021  Medication Sig   Accu-Chek Softclix Lancets lancets USE TO MONITOR GLUCOSE LEVELS 7 TIMES DAILY   Alcohol Swabs (B-D SINGLE USE SWABS REGULAR) PADS Use to cleanse site prior to use of lancet to obtain droplet of blood two times per day; E10.49   aspirin 81 MG tablet Take 81 mg by mouth daily.   atorvastatin (LIPITOR) 10 MG tablet TAKE 1 TABLET EVERY DAY   Blood Glucose Monitoring Suppl (ACCU-CHEK GUIDE ME) w/Device KIT 1 each by Does not apply route See admin instructions. Use to monitor glucose levels 7 times daily; E11.9; WILL NOT COMPLETE PA   DROPLET INSULIN SYRINGE 29G X 1/2" 1 ML MISC USE IN THE MORNING AND AT BEDTIME   gabapentin (NEURONTIN) 300 MG capsule TAKE 2 CAPSULES THREE TIMES DAILY   glucose blood (ACCU-CHEK GUIDE) test strip TEST BLOOD SUGAR 7 TIMES DAILY   losartan (COZAAR) 50 MG tablet TAKE 1 TABLET EVERY DAY   meloxicam (MOBIC) 15 MG tablet Take 1 tablet (15 mg total) by mouth daily.   mupirocin ointment (BACTROBAN) 2 % Apply 1 application topically 2 (two) times daily.   NOVOLIN 70/30 KWIKPEN (70-30) 100 UNIT/ML KwikPen INJECT 10 UNITS SUBCUTANEOUSLY TWICE DAILY WITH A MEAL (BREAKFAST AND SUPPER IF GLUCOSE IS ABOVE 90 AND YOU ARE EATING)   Tetrahydrozoline HCl (VISINE OP) Place 1 drop into both eyes daily as needed (redness).   triamcinolone cream (KENALOG) 0.1 % APPLY TO THE AFFECTED AREA(S) TOPICALLY THREE TIMES DAILY AS NEEDED FOR RASH   No  facility-administered encounter medications on file as of 03/31/2021.    Current antihyperglycemic regimen:  Novolin 70/30 10 units BID   What recent interventions/DTPs have been made to improve glycemic control:  Patient reported his blood sugars have been a little more elevated due to getting a steroid shot recently for Pleurisy. He reported he is much better.  Have there been any recent hospitalizations or ED visits since last visit with [facility]?  Patient has not had any ED visits or hospitalizations since last visit with [facility].   Patient denies hypoglycemic symptoms, including Sweaty, Shaky, Hungry, Nervous/irritable, and Vision changes   Patient denies hyperglycemic symptoms, including blurry vision, excessive thirst, fatigue, polyuria, and weakness   How often are you checking your blood sugar? Patient reported he has been checking several times a day.   What are your blood sugars ranging?  Fasting: 117 Before meals:  After meals:  Bedtime:   During the week, how often does your blood glucose drop below 70?  Patient reported he has not had any readings lower than 70.  Are you checking your feet daily/regularly? Patient reported     Adherence Review: Is the patient currently on a STATIN medication? No Is the patient currently on ACE/ARB medication? Yes Does the patient have >5 day gap between last estimated fill dates? No   Care Gaps  AWV: done 08/27/20 Colonoscopy: done 11/13/18 DM Eye Exam: due 03/20/20 DM Foot Exam:  due 04/09/21 Microalbumin: done 10/15/17 HbgAIC: done 12/17/20 (7.5) DEXA: N/A Mammogram: N/A  Star Rating Drugs: Losartan 50 mg - last filled 03/02/21 90 days  Future Appointments  Date Time Provider [place]  04/22/2021  8:30 AM Brita Romp, NP [facility] None  07/07/2021 12:00 PM [facility] PHARMACIST [facility] None    Jobe Gibbon, CCMA Clinical Pharmacist Assistant  [phone]  7 minutes spent in review, coordination, and  documentation.  Reviewed by: Beverly Milch, PharmD Clinical Pharmacist [phone]

## 2021-04-15 DIAGNOSIS — Z794 Long term (current) use of insulin: Secondary | ICD-10-CM | POA: Diagnosis not present

## 2021-04-15 DIAGNOSIS — E1165 Type 2 diabetes mellitus with hyperglycemia: Secondary | ICD-10-CM | POA: Diagnosis not present

## 2021-04-16 LAB — COMPREHENSIVE METABOLIC PANEL
ALT: 37 IU/L (ref 0–44)
AST: 52 IU/L — ABNORMAL HIGH (ref 0–40)
Albumin/Globulin Ratio: 1.4 (ref 1.2–2.2)
Albumin: 4.2 g/dL (ref 3.8–4.8)
Alkaline Phosphatase: 116 IU/L (ref 44–121)
BUN/Creatinine Ratio: 17 (ref 10–24)
BUN: 19 mg/dL (ref 8–27)
Bilirubin Total: 0.5 mg/dL (ref 0.0–1.2)
CO2: 24 mmol/L (ref 20–29)
Calcium: 9.1 mg/dL (ref 8.6–10.2)
Chloride: 106 mmol/L (ref 96–106)
Creatinine, Ser: 1.12 mg/dL (ref 0.76–1.27)
Globulin, Total: 3 g/dL (ref 1.5–4.5)
Glucose: 123 mg/dL — ABNORMAL HIGH (ref 70–99)
Potassium: 4.7 mmol/L (ref 3.5–5.2)
Sodium: 141 mmol/L (ref 134–144)
Total Protein: 7.2 g/dL (ref 6.0–8.5)
eGFR: 73 mL/min/{1.73_m2} (ref >59)

## 2021-04-16 LAB — LIPID PANEL
Chol/HDL Ratio: 3.2 ratio (ref 0.0–5.0)
Cholesterol, Total: 154 mg/dL (ref 100–199)
HDL: 48 mg/dL (ref >39)
LDL Chol Calc (NIH): 89 mg/dL (ref 0–99)
Triglycerides: 89 mg/dL (ref 0–149)
VLDL Cholesterol Cal: 17 mg/dL (ref 5–40)

## 2021-04-16 LAB — TSH: TSH: 1.49 u[IU]/mL (ref 0.450–4.500)

## 2021-04-16 LAB — T4, FREE: Free T4: 1.32 ng/dL (ref 0.82–1.77)

## 2021-04-19 ENCOUNTER — Ambulatory Visit: Payer: Medicare HMO | Admitting: Nurse Practitioner

## 2021-04-22 ENCOUNTER — Other Ambulatory Visit: Payer: Self-pay

## 2021-04-22 ENCOUNTER — Ambulatory Visit: Payer: Medicare HMO | Admitting: Nurse Practitioner

## 2021-04-22 ENCOUNTER — Encounter: Payer: Self-pay | Admitting: Nurse Practitioner

## 2021-04-22 VITALS — BP 133/67 | HR 75 | Ht 71.0 in | Wt 223.4 lb

## 2021-04-22 DIAGNOSIS — E1165 Type 2 diabetes mellitus with hyperglycemia: Secondary | ICD-10-CM

## 2021-04-22 DIAGNOSIS — Z794 Long term (current) use of insulin: Secondary | ICD-10-CM | POA: Diagnosis not present

## 2021-04-22 LAB — POCT GLYCOSYLATED HEMOGLOBIN (HGB A1C): HbA1c, POC (controlled diabetic range): 7.4 % — AB (ref 0.0–7.0)

## 2021-04-22 NOTE — Progress Notes (Signed)
Endocrinology Follow Up Note       04/22/2021, 8:43 AM   Subjective:    Patient ID: Aaron Fox, male    DOB: March 15, 1955.  Aaron Fox is being seen in follow up after being seen in consultation for management of currently uncontrolled symptomatic diabetes requested by  Aaron Frizzle, MD.   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 Talbert Forest   CATARACT EXTRACTION, BILATERAL     COLONOSCOPY  2004   LYMPH NODE BIOPSY Right 10/16/2014   Procedure: RIGHT INGUINAL LYMPH NODE BIOPSY;  Surgeon: Aviva Signs, MD;  Location: [facility];  Service: General;  Laterality: Right;    Social History   Socioeconomic History   Marital status: Married    Spouse name: Not on file   Number of children: 3   Years of education: Not on file   Highest  education level: Not on file  Occupational History   Occupation: Disability    Employer: [employer]  Tobacco Use   Smoking status: Former    Types: Cigarettes    Quit date: 11/02/1999    Years since quitting: 21.4   Smokeless tobacco: Never  Vaping Use   Vaping Use: Never used  Substance and Sexual Activity   Alcohol use: No    Alcohol/week: 0.0 standard drinks   Drug use: No   Sexual activity: Not on file  Other Topics Concern   Not on file  Social History Narrative   ** Merged History Encounter **       Married, 3 kids Prior work [employer] before disability Regular exercise-yes   Social Determinants of Radio broadcast assistant Strain: Low Risk    Difficulty of Paying Living Expenses: Not hard at all  Food Insecurity: No Food Insecurity   Worried About Charity fundraiser in the Last Year: Never true   [employer] of  Food in the Last Year: Never true  Transportation Needs: No Transportation Needs   Lack of Transportation (Medical): No   Lack of Transportation (Non-Medical): No  Physical Activity: Sufficiently Active   Days of Exercise per Week: 7 days   Minutes of Exercise per Session: 30 min  Stress: No Stress Concern Present   Feeling of Stress : Not at all  Social Connections: Moderately Integrated   Frequency of Communication with Friends and Family: More than three times a week   Frequency of Social Gatherings with Friends and Family: Three times a week   Attends Religious Services: More than 4 times per year   Active Member of Clubs or Organizations: No   Attends Archivist Meetings: Never   Marital Status: Married    Family History  Problem Relation Age of Onset   Heart disease Other        FH of CAD   Colon polyps Paternal Uncle    Kidney disease Paternal Uncle    Diabetes Paternal Grandfather    Diabetes Maternal Uncle    Cancer Neg Hx    Colon cancer Neg Hx    Gallbladder disease Neg Hx    Esophageal cancer Neg Hx    Rectal cancer Neg Hx     Stomach cancer Neg Hx     Outpatient Encounter Medications as of 04/22/2021  Medication Sig   Accu-Chek Softclix Lancets lancets USE TO MONITOR GLUCOSE LEVELS 7 TIMES DAILY   Alcohol Swabs (B-D SINGLE USE SWABS REGULAR) PADS Use to cleanse site prior to use of lancet to obtain droplet of blood two times per day; E10.49   aspirin 81 MG tablet Take 81 mg by mouth daily.   atorvastatin (LIPITOR) 10 MG tablet TAKE 1 TABLET EVERY DAY   Blood Glucose Monitoring Suppl (ACCU-CHEK GUIDE ME) w/Device KIT 1 each by Does not apply route See admin instructions. Use to monitor glucose levels 7 times daily; E11.9; WILL NOT COMPLETE PA   DROPLET INSULIN SYRINGE 29G X 1/2" 1 ML MISC USE IN THE MORNING AND AT BEDTIME   gabapentin (NEURONTIN) 300 MG capsule TAKE 2 CAPSULES THREE TIMES DAILY   glucose blood (ACCU-CHEK GUIDE) test strip TEST BLOOD SUGAR 7 TIMES DAILY   losartan (COZAAR) 50 MG tablet TAKE 1 TABLET EVERY DAY   meloxicam (MOBIC) 15 MG tablet Take 1 tablet (15 mg total) by mouth daily.   mupirocin ointment (BACTROBAN) 2 % Apply 1 application topically 2 (two) times daily.   NOVOLIN 70/30 KWIKPEN (70-30) 100 UNIT/ML KwikPen INJECT 10 UNITS SUBCUTANEOUSLY TWICE DAILY WITH A MEAL (BREAKFAST AND SUPPER IF GLUCOSE IS ABOVE 90 AND YOU ARE EATING)   Tetrahydrozoline HCl (VISINE OP) Place 1 drop into both eyes daily as needed (redness).   triamcinolone cream (KENALOG) 0.1 % APPLY TO THE AFFECTED AREA(S) TOPICALLY THREE TIMES DAILY AS NEEDED FOR RASH   No facility-administered encounter medications on file as of 04/22/2021.    ALLERGIES: Allergies  Allergen Reactions   Penicillins Swelling    Swelling of the hands and feet, skin peeling Did it involve swelling of the face/tongue/throat, SOB, or low BP? No Did it involve sudden or severe rash/hives, skin peeling, or any reaction on the inside of your mouth or nose? No Did you need to seek medical attention at a hospital or doctor's office? Yes When did  it last happen?      10 + years If all above answers are "NO", may proceed with cephalosporin use.  Ciprofloxacin Hives and Rash   Terbinafine Hcl Hives   Zestril [Lisinopril] Cough    VACCINATION STATUS: Immunization History  Administered Date(s) Administered   Influenza Whole 12/28/2008   Influenza,inj,Quad PF,6+ Mos 11/21/2013, 04/01/2015, 12/20/2015, 01/08/2017, 12/17/2017, 10/25/2018, 12/19/2019, 03/28/2021   Moderna Sars-Covid-2 Vaccination 04/29/2019, 05/27/2019, 12/29/2019   Pneumococcal Polysaccharide-23 06/11/2008   Td 09/29/2005   Tdap 10/15/2017   Zoster Recombinat (Shingrix) 06/14/2020, 08/25/2020    Diabetes He presents for his follow-up diabetic visit. He has type 2 diabetes mellitus. Onset time: was diagnosed at approx age of 39. His disease course has been stable. There are no hypoglycemic associated symptoms. Associated symptoms include foot paresthesias. Pertinent negatives for diabetes include no blurred vision, no chest pain, no fatigue, no polydipsia, no polyphagia, no polyuria and no weight loss. There are no hypoglycemic complications. Symptoms are stable. Diabetic complications include heart disease, impotence, nephropathy, peripheral neuropathy and retinopathy. 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 Current diabetic treatment includes insulin injections. He is compliant with treatment most of the time. His weight is fluctuating minimally. He is following a generally healthy diet. When asked about meal planning, he reported none. He has not had a previous visit with a dietitian. He participates in exercise three times a week. His home blood glucose trend is fluctuating minimally. His overall blood glucose range is 140-180 mg/dl. (He presents today with his meter and logs showing stable, at target glycemic profile most of the time.   His POCT A1c today is 7.4%, essentially unchanged from last visit.  He did say he had a steroid injection in between visits which temporarily caused his glucose readings to be higher than usual.  He denies any significant hypoglycemia.  Analysis of his meter shows 7-day average of 149, 14-day average of 155, 30-day average of 149, 90-day average of 148.) An ACE inhibitor/angiotensin II receptor blocker is being taken. He sees a podiatrist.Eye exam is current.  Hyperlipidemia This is a chronic problem. The current episode started more than 1 year ago. The problem is controlled. Recent lipid tests were reviewed and are normal. Exacerbating diseases include chronic renal disease and diabetes. There are no known factors aggravating his hyperlipidemia. Pertinent negatives include no chest pain. Current antihyperlipidemic treatment includes statins. The current treatment provides moderate improvement of lipids. There are no compliance problems.  Risk factors for coronary artery disease include diabetes mellitus, dyslipidemia, hypertension and male sex.  Hypertension This is a chronic problem. The current episode started more than 1 year ago. The problem has been gradually improving since onset. The problem is controlled. Pertinent negatives include no blurred vision or chest pain. There are no associated agents to hypertension. Risk factors for coronary artery disease include diabetes mellitus, dyslipidemia and male gender. Past treatments include angiotensin blockers. The current treatment provides moderate improvement. There are no compliance problems.  Hypertensive end-organ damage includes kidney disease, heart failure and retinopathy. Identifiable causes of hypertension include chronic renal disease.    Review of systems  Constitutional: + Minimally fluctuating body weight,  current Body mass index is 31.16 kg/m. , no fatigue, no subjective hyperthermia, no subjective hypothermia Eyes: no blurry vision, no  xerophthalmia ENT: no sore throat, no nodules palpated in throat, no dysphagia/odynophagia, no hoarseness Cardiovascular: no chest pain, no shortness of breath, no palpitations, no leg swelling Respiratory: no cough, no shortness of breath Gastrointestinal: no nausea/vomiting/diarrhea Musculoskeletal: no muscle/joint aches Skin:  no rashes, no hyperemia Neurological: no tremors, no numbness, no tingling, no dizziness Psychiatric: no depression, no anxiety  Objective:     BP 133/67    Pulse 75    Ht 5' 11" (1.803 m)    Wt 223 lb 6.4 oz (101.3 kg)    SpO2 100%    BMI 31.16 kg/m   Wt Readings from Last 3 Encounters:  04/22/21 223 lb 6.4 oz (101.3 kg)  03/28/21 225 lb (102.1 kg)  12/17/20 216 lb 9.6 oz (98.2 kg)     BP Readings from Last 3 Encounters:  04/22/21 133/67  03/28/21 138/72  12/17/20 (!) 151/68      Physical Exam- Limited  Constitutional:  Body mass index is 31.16 kg/m. , not in acute distress, normal state of mind Eyes:  EOMI, no exophthalmos Neck: Supple Cardiovascular: RRR, no murmurs, rubs, or gallops, no edema Respiratory: Adequate breathing efforts, no crackles, rales, rhonchi, or wheezing Musculoskeletal: no gross deformities, strength intact in all four extremities, no gross restriction of joint movements Skin:  no rashes, no hyperemia Neurological: no tremor with outstretched hands    CMP ( most recent) CMP     Component Value Date/Time   NA 141 04/15/2021 0826   K 4.7 04/15/2021 0826   CL 106 04/15/2021 0826   CO2 24 04/15/2021 0826   GLUCOSE 123 (H) 04/15/2021 0826   GLUCOSE 99 05/07/2020 0816   BUN 19 04/15/2021 0826   CREATININE 1.12 04/15/2021 0826   CREATININE 1.03 05/07/2020 0816   CALCIUM 9.1 04/15/2021 0826   PROT 7.2 04/15/2021 0826   ALBUMIN 4.2 04/15/2021 0826   AST 52 (H) 04/15/2021 0826   ALT 37 04/15/2021 0826   ALKPHOS 116 04/15/2021 0826   BILITOT 0.5 04/15/2021 0826   GFRNONAA 76 05/07/2020 0816   GFRAA 89 05/07/2020 0816      Diabetic Labs (most recent): Lab Results  Component Value Date   HGBA1C 7.4 (A) 04/22/2021   HGBA1C 7.5 (A) 12/17/2020   HGBA1C 7.0 08/06/2020     Lipid Panel ( most recent) Lipid Panel     Component Value Date/Time   CHOL 154 04/15/2021 0826   TRIG 89 04/15/2021 0826   HDL 48 04/15/2021 0826   CHOLHDL 3.2 04/15/2021 0826   CHOLHDL 2.7 05/07/2020 0816   VLDL 33.0 10/15/2017 0818   LDLCALC 89 04/15/2021 0826   LDLCALC 65 05/07/2020 0816   LDLDIRECT 120.2 04/04/2013 0918   LABVLDL 17 04/15/2021 0826      Lab Results  Component Value Date   TSH 1.490 04/15/2021   TSH 1.10 10/15/2017   TSH 1.63 04/28/2016   TSH 0.99 05/21/2014   TSH 1.43 04/04/2013   TSH 1.28 11/13/2011   TSH 0.77 03/29/2010   TSH 0.95 09/01/2009   TSH 0.71 02/08/2009   TSH 0.75 02/07/2008   FREET4 1.32 04/15/2021           Assessment & Plan:   1) Uncontrolled Type 2 Diabetes with hyperglycemia  He presents today with his meter and logs showing stable, at target glycemic profile most of the time.  His POCT A1c today is 7.4%, essentially unchanged from last visit.  He did say he had a steroid injection in between visits which temporarily caused his glucose readings to be higher than usual.  He denies any significant hypoglycemia.  Analysis of his meter shows 7-day average of 149, 14-day average of 155, 30-day average of 149, 90-day average of 148.  - [place]  has currently uncontrolled symptomatic type 2 DM since 66 years of age.  -Recent labs reviewed.  - I had a long discussion with him about the progressive nature of diabetes and the pathology behind its complications. -his diabetes is complicated by retinopathy, CKD, neuropathy, recent TMA and he remains at a high risk for more acute and chronic complications which include CAD, CVA, CKD, retinopathy, and neuropathy. These are all discussed in detail with him.  - Nutritional counseling repeated at each appointment due to  patients tendency to fall back in to old habits.  - The patient admits there is a room for improvement in their diet and drink choices. -  Suggestion is made for the patient to avoid simple carbohydrates from their diet including Cakes, Sweet Desserts / Pastries, Ice Cream, Soda (diet and regular), Sweet Tea, Candies, Chips, Cookies, Sweet Pastries, Store Bought Juices, Alcohol in Excess of 1-2 drinks a day, Artificial Sweeteners, Coffee Creamer, and "Sugar-free" Products. This will help patient to have stable blood glucose profile and potentially avoid unintended weight gain.   - I encouraged the patient to switch to unprocessed or minimally processed complex starch and increased protein intake (animal or plant source), fruits, and vegetables.   - Patient is advised to stick to a routine mealtimes to eat 3 meals a day and avoid unnecessary snacks (to snack only to correct hypoglycemia).  - I have approached him with the following individualized plan to manage  his diabetes and patient agrees:   -Given his stable, at goal glucose readings, he is advised to continue his current dose of 70/30 at 10 units twice daily with meals if glucose above 90 and he is eating.  He is encouraged to incorporate more exercise in his routine.  -he is encouraged to continue monitoring blood glucose 3 times daily, before injecting insulin (at breakfast and supper) and before bed, and to call the clinic if he has readings less than 70 or greater than 300 for 3 tests in a row.   - he is warned not to take insulin without proper monitoring per orders. - Adjustment parameters are given to him for hypo and hyperglycemia in writing.  - Specific targets for  A1c;  LDL, HDL,  and Triglycerides were discussed with the patient.  2) Blood Pressure /Hypertension:  his blood pressure is controlled to target.   he is advised to continue his current medications including Cozaar 50 mg p.o. daily with breakfast.  3)  Lipids/Hyperlipidemia:    Review of his recent lipid panel from 04/15/21 showed controlled  LDL at 89 .  he  is advised to continue Lipitor 10 mg daily at bedtime.  Side effects and precautions discussed with him.    4)  Weight/Diet:  his Body mass index is 31.16 kg/m.  -   clearly complicating his diabetes care.   he is a candidate for weight loss. I discussed with him the fact that loss of 5 - 10% of his  current body weight will have the most impact on his diabetes management.  Exercise, and detailed carbohydrates information provided  -  detailed on discharge instructions.  5) Chronic Care/Health Maintenance: -he is on ACEI/ARB and Statin medications and is encouraged to initiate and continue to follow up with Ophthalmology, Dentist, Podiatrist at least yearly or according to recommendations, and advised to stay away from smoking. I have recommended yearly flu vaccine and pneumonia vaccine at least every 5 years; moderate intensity exercise for up to 150 minutes weekly;  and sleep for at least 7 hours a day.  - he is advised to maintain close follow up with Aaron Frizzle, MD for primary care needs, as well as his other providers for optimal and coordinated care.      I spent 35 minutes in the care of the patient today including review of labs from [place], Lipids, Thyroid Function, Hematology (current and previous including abstractions from other facilities); face-to-face time discussing  his blood glucose readings/logs, discussing hypoglycemia and hyperglycemia episodes and symptoms, medications doses, his options of short and long term treatment based on the latest standards of care / guidelines;  discussion about incorporating lifestyle medicine;  and documenting the encounter.    Please refer to Patient Instructions for Blood Glucose Monitoring and Insulin/Medications Dosing Guide"  in media tab for additional information. Please  also refer to " Patient Self Inventory" in the Media  tab for  reviewed elements of pertinent patient history.  [place] participated in the discussions, expressed understanding, and voiced agreement with the above plans.  All questions were answered to his satisfaction. he is encouraged to contact clinic should he have any questions or concerns prior to his return visit.   Follow up plan: - Return in about 6 months (around 10/20/2021) for Diabetes F/U- A1c and UM in office, No previsit labs, Bring meter and logs.  [name], [facility] [address] [place], [place] [address] Phone: [phone] Fax: [phone]  04/22/2021, 8:43 AM

## 2021-04-22 NOTE — Patient Instructions (Signed)

## 2021-05-06 ENCOUNTER — Other Ambulatory Visit: Payer: Self-pay

## 2021-05-06 ENCOUNTER — Ambulatory Visit (INDEPENDENT_AMBULATORY_CARE_PROVIDER_SITE_OTHER): Payer: Medicare HMO | Admitting: Podiatry

## 2021-05-06 DIAGNOSIS — M79675 Pain in left toe(s): Secondary | ICD-10-CM

## 2021-05-06 DIAGNOSIS — Z89439 Acquired absence of unspecified foot: Secondary | ICD-10-CM

## 2021-05-06 DIAGNOSIS — E1149 Type 2 diabetes mellitus with other diabetic neurological complication: Secondary | ICD-10-CM | POA: Diagnosis not present

## 2021-05-06 DIAGNOSIS — L84 Corns and callosities: Secondary | ICD-10-CM | POA: Diagnosis not present

## 2021-05-06 DIAGNOSIS — B351 Tinea unguium: Secondary | ICD-10-CM | POA: Diagnosis not present

## 2021-05-06 NOTE — Patient Instructions (Signed)

## 2021-05-06 NOTE — Progress Notes (Signed)
Subjective: ?66 year old male presents the office today for thick, elongated toenails that he cannot trim himself on his left foot.  Denies any open sores but he has noticed a callus formed on the right foot.  No ulceration of the right foot.  No recent injuries or changes.  Last about new diabetic shoes. ? ?Objective: ?AAO x3, NAD ?DP/PT pulses palpable bilaterally, CRT less than 3 seconds ?Hammertoes present.  ?Hyperkeratotic tissue noted on the plantar medial aspect of the right foot on the transmetatarsal amputation site.  There is no underlying ulceration drainage or any signs of infection. ?The nails on the left foot are hypertrophic, dystrophic with brown discoloration.  They do cause discomfort as they get elongated.  No edema, erythema or signs of infection.  No open lesions bilaterally. ?No pain with calf compression, swelling, warmth, erythema ? ?Assessment: ?Symptomatic onychomycosis left side with preulcerative callus right foot ? ?Plan: ?-All treatment options discussed with the patient including all alternatives, risks, complications.  ?-Sharply debrided the nails in the left foot x5 without any complications or bleeding. ?-On the right side obstructed debrided hyperkeratotic lesion x1 without any complications or bleeding.  He does not seem to have significant equinus today but I did discuss traction exercises to help as well as offloading and moisturizer daily. ?-Follow-up with Aaron Edelman for new diabetic shoes. ? ?Return in about 3 months (around 08/06/2021). ? ?Trula Slade DPM ?

## 2021-05-10 ENCOUNTER — Telehealth: Payer: Self-pay | Admitting: Pharmacist

## 2021-05-10 NOTE — Progress Notes (Signed)
? ? ?Chronic Care Management ?Pharmacy Assistant  ? ?Name: Aaron Fox  MRN: [id number] DOB: Aug 26, 1955 ? ? ? ?Reason for Encounter: Disease State - Diabetes Call  ?  ? ? ?Recent office visits:  ?None noted.  ? ?Recent consult visits:  ?05/06/21 Celesta Gentile DPM - Podiatry - Type 2 Diabetes - Sharply debrided the nails in the left foot x5 without any complications or bleeding.  Right side obstructed debrided hyperkeratotic lesion x1 without any complications or bleeding. Follow up in 3 months.  ? ?04/22/21 [name], NP - Endocrinology - Diabetes - Labs were ordered. Follow up in 6 months.  ? ?Hospital visits:  ?None in previous 6 months ? ?Medications: ?Outpatient Encounter Medications as of 05/10/2021  ?Medication Sig  ? Accu-Chek Softclix Lancets lancets USE TO MONITOR GLUCOSE LEVELS 7 TIMES DAILY  ? Alcohol Swabs (B-D SINGLE USE SWABS REGULAR) PADS Use to cleanse site prior to use of lancet to obtain droplet of blood two times per day; E10.49  ? aspirin 81 MG tablet Take 81 mg by mouth daily.  ? atorvastatin (LIPITOR) 10 MG tablet TAKE 1 TABLET EVERY DAY  ? Blood Glucose Monitoring Suppl (ACCU-CHEK GUIDE ME) w/Device KIT 1 each by Does not apply route See admin instructions. Use to monitor glucose levels 7 times daily; E11.9; WILL NOT COMPLETE PA  ? DROPLET INSULIN SYRINGE 29G X 1/2" 1 ML MISC USE IN THE MORNING AND AT BEDTIME  ? gabapentin (NEURONTIN) 300 MG capsule TAKE 2 CAPSULES THREE TIMES DAILY  ? glucose blood (ACCU-CHEK GUIDE) test strip TEST BLOOD SUGAR 7 TIMES DAILY  ? losartan (COZAAR) 50 MG tablet TAKE 1 TABLET EVERY DAY  ? meloxicam (MOBIC) 15 MG tablet Take 1 tablet (15 mg total) by mouth daily.  ? mupirocin ointment (BACTROBAN) 2 % Apply 1 application topically 2 (two) times daily.  ? NOVOLIN 70/30 KWIKPEN (70-30) 100 UNIT/ML KwikPen INJECT 10 UNITS SUBCUTANEOUSLY TWICE DAILY WITH A MEAL (BREAKFAST AND SUPPER IF GLUCOSE IS ABOVE 90 AND YOU ARE EATING)  ? Tetrahydrozoline HCl (VISINE  OP) Place 1 drop into both eyes daily as needed (redness).  ? triamcinolone cream (KENALOG) 0.1 % APPLY TO THE AFFECTED AREA(S) TOPICALLY THREE TIMES DAILY AS NEEDED FOR RASH  ? ?No facility-administered encounter medications on file as of 05/10/2021.  ? ? ?Current antihyperglycemic regimen:  ?Novolin 70/30 10 units BID ? ? ?What recent interventions/DTPs have been made to improve glycemic control:  ? ? ?Have there been any recent hospitalizations or ED visits since last visit with [facility]?  ?Patient has not had any ED visits or hospitalizations since last visit with [facility].  ? ?Patient  hypoglycemic symptoms, including  ? ? ?Patient  hyperglycemic symptoms, including  ? ? ?How often are you checking your blood sugar? ? ?  ?What are your blood sugars ranging?  ?Fasting:  ?Before meals:  ?After meals:  ?Bedtime:  ? ?During the week, how often does your blood glucose drop below 70? ?  ? ?Are you checking your feet daily/regularly? ?Patient is followed by podiatry and was recently seen on 05/06/21. ?  ? ?Adherence Review: ?Is the patient currently on a STATIN medication? No ?Is the patient currently on ACE/ARB medication? Yes ?Does the patient have >5 day gap between last estimated fill dates? No ? ? ?Care Gaps ? ?AWV: done 08/27/20 ?Colonoscopy: done 11/13/18 ?DM Eye Exam: due 03/20/20 ?DM Foot Exam: done 05/06/21 ?Microalbumin: done 10/15/17 ?HbgAIC: done 04/22/21 (7.4) ?DEXA: N/A ?Mammogram: N/A ? ? ?  Star Rating Drugs: ?Losartan 50 mg - last filled 03/02/21 90 days ? ? ?Future Appointments  ?Date Time Provider [place]  ?08/12/2021  7:30 AM Jacqualyn Posey Bonna Gains, DPM [facility]  ?10/06/2021 10:00 AM [facility] PHARMACIST [facility]  ?10/20/2021  8:30 AM Brita Romp, NP [facility] None  ? ?Multiple attempts were made to contact patient. Attempts were unsuccessful. / ls,CMA  ? ?Liza Showfety, CCMA ?Clinical Pharmacist Assistant  ?[phone] ? ?

## 2021-05-14 ENCOUNTER — Other Ambulatory Visit: Payer: Self-pay | Admitting: Family Medicine

## 2021-06-03 ENCOUNTER — Telehealth: Payer: Self-pay | Admitting: Pharmacist

## 2021-06-03 NOTE — Progress Notes (Signed)
? ? ?Chronic Care Management ?Pharmacy Assistant  ? ?Name: Aaron Fox  MRN: [id number] DOB: 05/22/1955 ? ? ?Reason for Encounter: Disease State - Diabetes Call  ?  ? ?Recent office visits:  ?None noted.  ? ?Recent consult visits:  ?None noted.  ? ?Hospital visits:  ?None in previous 6 months ? ?Medications: ?Outpatient Encounter Medications as of 06/03/2021  ?Medication Sig  ? Accu-Chek Softclix Lancets lancets USE TO MONITOR GLUCOSE LEVELS 7 TIMES DAILY  ? Alcohol Swabs (B-D SINGLE USE SWABS REGULAR) PADS Use to cleanse site prior to use of lancet to obtain droplet of blood two times per day; E10.49  ? aspirin 81 MG tablet Take 81 mg by mouth daily.  ? atorvastatin (LIPITOR) 10 MG tablet TAKE 1 TABLET EVERY DAY  ? Blood Glucose Monitoring Suppl (ACCU-CHEK GUIDE ME) w/Device KIT 1 each by Does not apply route See admin instructions. Use to monitor glucose levels 7 times daily; E11.9; WILL NOT COMPLETE PA  ? DROPLET INSULIN SYRINGE 29G X 1/2" 1 ML MISC USE IN THE MORNING AND AT BEDTIME  ? gabapentin (NEURONTIN) 300 MG capsule TAKE 2 CAPSULES THREE TIMES DAILY  ? glucose blood (ACCU-CHEK GUIDE) test strip TEST BLOOD SUGAR 7 TIMES DAILY  ? losartan (COZAAR) 50 MG tablet TAKE 1 TABLET EVERY DAY  ? meloxicam (MOBIC) 15 MG tablet Take 1 tablet (15 mg total) by mouth daily.  ? mupirocin ointment (BACTROBAN) 2 % Apply 1 application topically 2 (two) times daily.  ? NOVOLIN 70/30 KWIKPEN (70-30) 100 UNIT/ML KwikPen INJECT 10 UNITS SUBCUTANEOUSLY TWICE DAILY WITH A MEAL (BREAKFAST AND SUPPER IF GLUCOSE IS ABOVE 90 AND YOU ARE EATING)  ? Tetrahydrozoline HCl (VISINE OP) Place 1 drop into both eyes daily as needed (redness).  ? triamcinolone cream (KENALOG) 0.1 % APPLY TO THE AFFECTED AREA(S) TOPICALLY THREE TIMES DAILY AS NEEDED FOR RASH  ? ?No facility-administered encounter medications on file as of 06/03/2021.  ? ?Current antihyperglycemic regimen:  ?Novolin 70/30 10 units BID ?  ?What recent interventions/DTPs have been  made to improve glycemic control:  ?  ?  ?Have there been any recent hospitalizations or ED visits since last visit with CPP?  ?Patient has not had any ED visits or hospitalizations since last visit with CPP.  ?  ?Patient  hypoglycemic symptoms, including  ?  ?  ?Patient  hyperglycemic symptoms, including  ?  ?  ?How often are you checking your blood sugar? ? ?             ?What are your blood sugars ranging?  ?Fasting:  ?Before meals:  ?After meals:  ?Bedtime:  ?  ?During the week, how often does your blood glucose drop below 70? ? ?  ?Are you checking your feet daily/regularly? ?Patient is followed by podiatry and was recently seen on 05/06/21. ?             ?  ?Adherence Review: ?Is the patient currently on a STATIN medication? No ?Is the patient currently on ACE/ARB medication? Yes ?Does the patient have >5 day gap between last estimated fill dates? No ?  ?  ?Care Gaps ?  ?AWV: done 08/27/20 ?Colonoscopy: done 11/13/18 ?DM Eye Exam: due 03/20/20 ?DM Foot Exam: done 05/06/21 ?Microalbumin: done 10/15/17 ?HbgAIC: done 04/22/21 (7.4) ?DEXA: N/A ?Mammogram: N/A ?  ?  ?Star Rating Drugs: ?Losartan 50 mg - last filled 05/17/21 90 days ? ? ?Future Appointments  ?Date Time Provider [place]  ?08/12/2021  7:30 AM  Trula Slade, DPM [facility]  ?10/06/2021 10:00 AM [facility] PHARMACIST [facility]  ?10/20/2021  8:30 AM Brita Romp, NP [facility] None  ? ?Multiple attempts were made to contact patient. Attempts were unsuccessful. / ls,CMA  ? ?Liza Showfety, CCMA ?Clinical Pharmacist Assistant  ?[phone] ? ?

## 2021-06-13 ENCOUNTER — Other Ambulatory Visit: Payer: Self-pay | Admitting: Family Medicine

## 2021-06-30 ENCOUNTER — Ambulatory Visit: Payer: Medicare HMO

## 2021-06-30 ENCOUNTER — Other Ambulatory Visit: Payer: Self-pay | Admitting: Podiatry

## 2021-06-30 DIAGNOSIS — Z89439 Acquired absence of unspecified foot: Secondary | ICD-10-CM

## 2021-06-30 DIAGNOSIS — E1149 Type 2 diabetes mellitus with other diabetic neurological complication: Secondary | ICD-10-CM

## 2021-06-30 DIAGNOSIS — L84 Corns and callosities: Secondary | ICD-10-CM

## 2021-06-30 NOTE — Progress Notes (Signed)
SITUATION ?Reason for Consult: Evaluation for Prefabricated Diabetic Shoes and Custom Diabetic Inserts. ?Patient / Caregiver Report: Patient would like well fitting shoes ? ?OBJECTIVE DATA: ?Patient History / Diagnosis:  ?  ICD-10-CM   ?1. Type II diabetes mellitus with neurological manifestations ([place])  E11.49   ?  ?2. History of transmetatarsal amputation of foot ([place])  Z89.439   ?  ? ? ?Physician Treating Diabetes:  Dr Dorris Fetch, endo ? ?Current or Previous Devices:   Current user ? ?In-Person Foot Examination: ?Ulcers & Callousing:   Historical ?Deformities:    Right transmet ?Sensation:    Compromised  ?Shoe Size:     12W ? ?ORTHOTIC RECOMMENDATION ?Recommended Devices: ?- 1x pair prefabricated PDAC approved diabetic shoes; Patient Selected Apex [id number] Size 12W ?- 3x pair custom-to-patient PDAC approved vacuum formed diabetic insoles. ? ?GOALS OF SHOES AND INSOLES ?- Reduce shear and pressure ?- Reduce / Prevent callus formation ?- Reduce / Prevent ulceration ?- Protect the fragile healing compromised diabetic foot. ? ?Patient would benefit from diabetic shoes and inserts as patient has diabetes mellitus and the patient has one or more of the following conditions: ?- History of partial or complete amputation of the foot ?- History of previous foot ulceration. ?- History of pre-ulcerative callus ?- Peripheral neuropathy with evidence of callus formation ?- Foot deformity ?- Poor circulation ? ?ACTIONS PERFORMED ?Potential out of pocket cost was communicated to patient. Patient understood and consented to measurement and casting. Patient was casted for insoles via crush box and measured for shoes via brannock device. Procedure was explained and patient tolerated procedure well. All questions were answered and concerns addressed. Casts were shipped to central fabrication for HOLD until Certificate of Medical Necessity or otherwise necessary authorization from insurance is obtained. ? ?PLAN ?Shoes are to be ordered and  casts released from hold once all appropriate paperwork is complete. Patient is to be contacted and scheduled for fitting once shoes and insoles have been fabricated and received. ? ?

## 2021-07-07 ENCOUNTER — Telehealth: Payer: Medicare HMO

## 2021-07-08 ENCOUNTER — Telehealth: Payer: Self-pay | Admitting: Pharmacist

## 2021-07-08 NOTE — Progress Notes (Signed)
Chronic Care Management Pharmacy Assistant   Name: Aaron Fox  MRN: [id number] DOB: Dec 18, 1955   Reason for Encounter: Disease State - Diabetes Call     Recent office visits:  None noted.   Recent consult visits:  None noted.   Hospital visits:  None in previous 6 months  Medications: Outpatient Encounter Medications as of 07/08/2021  Medication Sig   Accu-Chek Softclix Lancets lancets USE TO MONITOR GLUCOSE LEVELS 7 TIMES DAILY   Alcohol Swabs (B-D SINGLE USE SWABS REGULAR) PADS Use to cleanse site prior to use of lancet to obtain droplet of blood two times per day; E10.49   aspirin 81 MG tablet Take 81 mg by mouth daily.   atorvastatin (LIPITOR) 10 MG tablet TAKE 1 TABLET EVERY DAY   Blood Glucose Monitoring Suppl (ACCU-CHEK GUIDE ME) w/Device KIT 1 each by Does not apply route See admin instructions. Use to monitor glucose levels 7 times daily; E11.9; WILL NOT COMPLETE PA   DROPLET INSULIN SYRINGE 29G X 1/2" 1 ML MISC USE IN THE MORNING AND AT BEDTIME   gabapentin (NEURONTIN) 300 MG capsule TAKE 2 CAPSULES THREE TIMES DAILY   glucose blood (ACCU-CHEK GUIDE) test strip TEST BLOOD SUGAR 7 TIMES DAILY   losartan (COZAAR) 50 MG tablet TAKE 1 TABLET EVERY DAY   meloxicam (MOBIC) 15 MG tablet Take 1 tablet (15 mg total) by mouth daily.   mupirocin ointment (BACTROBAN) 2 % Apply 1 application topically 2 (two) times daily.   NOVOLIN 70/30 KWIKPEN (70-30) 100 UNIT/ML KwikPen INJECT 10 UNITS SUBCUTANEOUSLY TWICE DAILY WITH A MEAL (BREAKFAST AND SUPPER IF GLUCOSE IS ABOVE 90 AND YOU ARE EATING)   Tetrahydrozoline HCl (VISINE OP) Place 1 drop into both eyes daily as needed (redness).   triamcinolone cream (KENALOG) 0.1 % APPLY TO THE AFFECTED AREA(S) TOPICALLY THREE TIMES DAILY AS NEEDED FOR RASH   No facility-administered encounter medications on file as of 07/08/2021.    Current antihyperglycemic regimen:  Novolin 70/30 10 units BID   What recent interventions/DTPs have  been made to improve glycemic control:  Patient reported   Have there been any recent hospitalizations or ED visits since last visit with CPP?  Patient has not had any hospitalizations or ED visits since last visit with CPP.  Patient  hypoglycemic symptoms, including    Patient  hyperglycemic symptoms, including    How often are you checking your blood sugar? Patient reported checking blood sugars    What are your blood sugars ranging?  Fasting:  Before meals:  After meals:  Bedtime:   During the week, how often does your blood glucose drop below 70?  Patient reported   Are you checking your feet daily/regularly? Patient is followed by Podiatry. Last visit was on 12/24/20.    Adherence Review: Is the patient currently on a STATIN medication? No Is the patient currently on ACE/ARB medication? Yes Does the patient have >5 day gap between last estimated fill dates? No   Care Gaps   AWV: done 08/27/20 Colonoscopy: done 11/13/18 DM Eye Exam: due 03/20/20 DM Foot Exam: due 04/09/21 Microalbumin: done 05/07/20 HbgAIC: done 04/22/21 (7.4) DEXA: N/A Mammogram: N/A   Star Rating Drugs: Losartan 50 mg - last filled 05/17/21 90 days    Future Appointments  Date Time Provider [place]  08/12/2021  7:30 AM Trula Slade, DPM [facility]  10/06/2021 10:00 AM [facility] PHARMACIST [facility]  10/20/2021  8:30 AM Brita Romp, NP [facility] None  Multiple attempts were made to contact patient. Attempts were unsuccessful. / ls,CMA   Jobe Gibbon, [place] Pharmacist Assistant  [phone]

## 2021-07-17 ENCOUNTER — Other Ambulatory Visit: Payer: Self-pay | Admitting: Endocrinology

## 2021-07-17 DIAGNOSIS — E11319 Type 2 diabetes mellitus with unspecified diabetic retinopathy without macular edema: Secondary | ICD-10-CM

## 2021-08-05 DIAGNOSIS — E119 Type 2 diabetes mellitus without complications: Secondary | ICD-10-CM | POA: Diagnosis not present

## 2021-08-05 LAB — HM DIABETES EYE EXAM

## 2021-08-12 ENCOUNTER — Encounter: Payer: Self-pay | Admitting: Podiatry

## 2021-08-12 ENCOUNTER — Ambulatory Visit: Payer: Medicare HMO | Admitting: Podiatry

## 2021-08-12 DIAGNOSIS — E1149 Type 2 diabetes mellitus with other diabetic neurological complication: Secondary | ICD-10-CM

## 2021-08-12 DIAGNOSIS — M79675 Pain in left toe(s): Secondary | ICD-10-CM | POA: Diagnosis not present

## 2021-08-12 DIAGNOSIS — Z89439 Acquired absence of unspecified foot: Secondary | ICD-10-CM | POA: Diagnosis not present

## 2021-08-12 NOTE — Progress Notes (Signed)
This patient presents to the office with chief complaint of long thick nails and diabetic feet.  This patient  says there  is  no pain and discomfort in their feet.  This patient says there are long thick painful nails on his left foot. .  These nails are painful walking and wearing shoes.  Patient has TMA right foot.  Patient is unable to  self treat his own nails . This patient presents  to the office today for treatment of the  long nails and a foot evaluation of his diabetic feet and requests his diabetic shoes.  General Appearance  Alert, conversant and in no acute stress.  Vascular  Dorsalis pedis and posterior tibial  pulses are weakly  palpable left foot. .  Capillary return is within normal limits  left. Temperature is within normal limits  left.  Neurologic  Senn-Weinstein monofilament wire test diminished left foot.. Muscle power within normal limits bilaterally.  Nails Thick disfigured discolored nails with subungual debris  from hallux to fifth toes left foot. No evidence of bacterial infection or drainage bilaterally.  Orthopedic  No limitations of motion of motion feet .  No crepitus or effusions noted.  No bony pathology or digital deformities noted.  TMA right foot.  Skin  normotropic skin with no porokeratosis noted bilaterally.  No signs of infections or ulcers noted.  Asymptomatic callus on stump of right foot.   Onychomycosis  Diabetes with neuropathy.  TMA   Debride nails with nail nipper followed by dremel tool.  Patient requested diabetic shoes.  Checked with Christon and she is looking into the shoes.  Padding dispensed.  RTC 3 months.   Gardiner Barefoot DPM

## 2021-08-27 ENCOUNTER — Emergency Department (HOSPITAL_COMMUNITY)
Admission: EM | Admit: 2021-08-27 | Discharge: 2021-08-27 | Disposition: A | Discharge status: Home / Self Care | Payer: Medicare HMO | Attending: Emergency Medicine | Admitting: Emergency Medicine

## 2021-08-27 ENCOUNTER — Other Ambulatory Visit: Payer: Self-pay

## 2021-08-27 ENCOUNTER — Encounter (HOSPITAL_COMMUNITY): Payer: Self-pay | Admitting: *Deleted

## 2021-08-27 ENCOUNTER — Emergency Department (HOSPITAL_COMMUNITY): Payer: Medicare HMO

## 2021-08-27 DIAGNOSIS — Z79899 Other long term (current) drug therapy: Secondary | ICD-10-CM | POA: Diagnosis not present

## 2021-08-27 DIAGNOSIS — J8489 Other specified interstitial pulmonary diseases: Secondary | ICD-10-CM | POA: Diagnosis not present

## 2021-08-27 DIAGNOSIS — Z794 Long term (current) use of insulin: Secondary | ICD-10-CM | POA: Insufficient documentation

## 2021-08-27 DIAGNOSIS — I110 Hypertensive heart disease with heart failure: Secondary | ICD-10-CM | POA: Diagnosis not present

## 2021-08-27 DIAGNOSIS — E1165 Type 2 diabetes mellitus with hyperglycemia: Secondary | ICD-10-CM | POA: Insufficient documentation

## 2021-08-27 DIAGNOSIS — I509 Heart failure, unspecified: Secondary | ICD-10-CM | POA: Insufficient documentation

## 2021-08-27 DIAGNOSIS — Z7982 Long term (current) use of aspirin: Secondary | ICD-10-CM | POA: Diagnosis not present

## 2021-08-27 DIAGNOSIS — R0789 Other chest pain: Secondary | ICD-10-CM | POA: Diagnosis not present

## 2021-08-27 DIAGNOSIS — J209 Acute bronchitis, unspecified: Secondary | ICD-10-CM | POA: Diagnosis not present

## 2021-08-27 DIAGNOSIS — R0602 Shortness of breath: Secondary | ICD-10-CM | POA: Diagnosis not present

## 2021-08-27 DIAGNOSIS — R079 Chest pain, unspecified: Secondary | ICD-10-CM | POA: Diagnosis not present

## 2021-08-27 LAB — CBC
HCT: 35.6 % — ABNORMAL LOW (ref 39.0–52.0)
Hemoglobin: 12.4 g/dL — ABNORMAL LOW (ref 13.0–17.0)
MCH: 29.5 pg (ref 26.0–34.0)
MCHC: 34.8 g/dL (ref 30.0–36.0)
MCV: 84.8 fL (ref 80.0–100.0)
Platelets: 186 10*3/uL (ref 150–400)
RBC: 4.2 MIL/uL — ABNORMAL LOW (ref 4.22–5.81)
RDW: 13.3 % (ref 11.5–15.5)
WBC: 4.4 10*3/uL (ref 4.0–10.5)
nRBC: 0 % (ref 0.0–0.2)

## 2021-08-27 LAB — BASIC METABOLIC PANEL
Anion gap: 6 (ref 5–15)
BUN: 18 mg/dL (ref 8–23)
CO2: 22 mmol/L (ref 22–32)
Calcium: 8.9 mg/dL (ref 8.9–10.3)
Chloride: 109 mmol/L (ref 98–111)
Creatinine, Ser: 1.21 mg/dL (ref 0.61–1.24)
GFR, Estimated: >60 mL/min (ref >60)
Glucose, Bld: 262 mg/dL — ABNORMAL HIGH (ref 70–99)
Potassium: 4 mmol/L (ref 3.5–5.1)
Sodium: 137 mmol/L (ref 135–145)

## 2021-08-27 LAB — TROPONIN I (HIGH SENSITIVITY)
Troponin I (High Sensitivity): 5 ng/L (ref <18)
Troponin I (High Sensitivity): 5 ng/L (ref <18)

## 2021-08-27 LAB — BRAIN NATRIURETIC PEPTIDE: B Natriuretic Peptide: 36 pg/mL (ref 0.0–100.0)

## 2021-08-27 MED ORDER — ALBUTEROL SULFATE (2.5 MG/3ML) 0.083% IN NEBU
2.5000 mg | INHALATION_SOLUTION | Freq: Once | RESPIRATORY_TRACT | Status: AC
  Administered 2021-08-27: 2.5 mg via RESPIRATORY_TRACT
  Filled 2021-08-27: qty 3

## 2021-08-27 MED ORDER — ASPIRIN 81 MG PO CHEW: 324.0000 mg | CHEWABLE_TABLET | Freq: Once | ORAL | Status: DC

## 2021-08-27 MED ORDER — PREDNISONE 20 MG PO TABS: 40.0000 mg | ORAL_TABLET | Freq: Every day | ORAL | 0 refills | Status: DC

## 2021-08-27 MED ORDER — METHYLPREDNISOLONE SODIUM SUCC 125 MG IJ SOLR
125.0000 mg | Freq: Once | INTRAMUSCULAR | Status: AC
  Administered 2021-08-27: 125 mg via INTRAVENOUS
  Filled 2021-08-27: qty 2

## 2021-08-27 MED ORDER — ALBUTEROL SULFATE HFA 108 (90 BASE) MCG/ACT IN AERS
1.0000 | INHALATION_SPRAY | RESPIRATORY_TRACT | Status: DC
  Administered 2021-08-27: 1 via RESPIRATORY_TRACT
  Filled 2021-08-27: qty 6.7

## 2021-08-27 NOTE — ED Provider Notes (Signed)
[facility] Provider Note   CSN: [id number] Arrival date & time: 08/27/21  3154     History  Chief Complaint  Patient presents with   Chest Pain    Aaron Fox is a 66 y.o. male.  HPI Patient presents with chest pain, dyspnea.  Patient has a history of CHF, hypertension, hyperlipidemia.  Over the past 3 to 4 days, he has had persistent left-sided chest pain with dyspnea that began soon after onset of pain.  No fever, syncope, other pain, nausea, vomiting.  He has been taking medication as directed.  He notes symptoms are somewhat similar to episodes of CHF exacerbation in the past, though this did not typically include pain.    Home Medications Prior to Admission medications   Medication Sig Start Date End Date Taking? Authorizing Provider  predniSONE (DELTASONE) 20 MG tablet Take 2 tablets (40 mg total) by mouth daily with breakfast. For the next four days 08/27/21  Yes Carmin Muskrat, MD  Accu-Chek Softclix Lancets lancets TEST  7  TIMES  DAILY 07/18/21   Philemon Kingdom, MD  Alcohol Swabs (B-D SINGLE USE SWABS REGULAR) PADS Use to cleanse site prior to use of lancet to obtain droplet of blood two times per day; E10.49 06/03/18   Renato Shin, MD  aspirin 81 MG tablet Take 81 mg by mouth daily.    [provider]  atorvastatin (LIPITOR) 10 MG tablet TAKE 1 TABLET EVERY DAY 06/13/21   [name], MD  Blood Glucose Monitoring Suppl (ACCU-CHEK GUIDE ME) w/Device KIT 1 each by Does not apply route See admin instructions. Use to monitor glucose levels 7 times daily; E11.9; WILL NOT COMPLETE PA 12/14/20   [name], MD  DROPLET INSULIN SYRINGE 29G X 1/2" 1 ML MISC USE IN THE MORNING AND AT BEDTIME 12/17/20   Renato Shin, MD  gabapentin (NEURONTIN) 300 MG capsule TAKE 2 CAPSULES THREE TIMES DAILY 08/10/20   [name], MD  glucose blood (ACCU-CHEK GUIDE) test strip TEST BLOOD SUGAR 7 TIMES DAILY 12/14/20   [name], MD   losartan (COZAAR) 50 MG tablet TAKE 1 TABLET EVERY DAY 05/16/21   [name], MD  meloxicam (MOBIC) 15 MG tablet Take 1 tablet (15 mg total) by mouth daily. 03/28/21   [name], MD  mupirocin ointment (BACTROBAN) 2 % Apply 1 application topically 2 (two) times daily. 12/13/20   Trula Slade, DPM  NOVOLIN 70/30 KWIKPEN (70-30) 100 UNIT/ML KwikPen INJECT 10 UNITS SUBCUTANEOUSLY TWICE DAILY WITH A MEAL (BREAKFAST AND SUPPER IF GLUCOSE IS ABOVE 90 AND YOU ARE EATING) 02/14/21   Brita Romp, NP  Tetrahydrozoline HCl (VISINE OP) Place 1 drop into both eyes daily as needed (redness).    [provider]  triamcinolone cream (KENALOG) 0.1 % APPLY TO THE AFFECTED AREA(S) TOPICALLY THREE TIMES DAILY AS NEEDED FOR RASH 12/09/20   [name], MD      Allergies    Penicillins, Ciprofloxacin, Terbinafine hcl, and Zestril [lisinopril]    Review of Systems   Review of Systems  All other systems reviewed and are negative.   Physical Exam Updated Vital Signs BP 126/75   Pulse 67   Temp 98.5 F (36.9 C) (Oral)   Resp 18   Ht 5' 11.5" (1.816 m)   Wt 102.1 kg   SpO2 99%   BMI 30.94 kg/m  Physical Exam Vitals and nursing note reviewed.  Constitutional:  General: He is not in acute distress.    Appearance: He is well-developed.  HENT:     Head: Normocephalic and atraumatic.  Eyes:     Conjunctiva/sclera: Conjunctivae normal.  Cardiovascular:     Rate and Rhythm: Normal rate and regular rhythm.  Pulmonary:     Effort: Pulmonary effort is normal. No respiratory distress.     Breath sounds: No stridor.  Abdominal:     General: There is no distension.  Skin:    General: Skin is warm and dry.  Neurological:     Mental Status: He is alert and oriented to person, place, and time.     ED Results / Procedures / Treatments   Labs (all labs ordered are listed, but only abnormal results are displayed) Labs Reviewed  BASIC METABOLIC PANEL - Abnormal;  Notable for the following components:      Result Value   Glucose, Bld 262 (*)    All other components within normal limits  CBC - Abnormal; Notable for the following components:   RBC 4.20 (*)    Hemoglobin 12.4 (*)    HCT 35.6 (*)    All other components within normal limits  BRAIN NATRIURETIC PEPTIDE  CBG MONITORING, ED  TROPONIN I (HIGH SENSITIVITY)  TROPONIN I (HIGH SENSITIVITY)    EKG EKG Interpretation  Date/Time:  Saturday August 27 2021 10:07:56 EDT Ventricular Rate:  91 PR Interval:  166 QRS Duration: 85 QT Interval:  360 QTC Calculation: 443 R Axis:   61 Text Interpretation: Sinus rhythm Left atrial enlargement Otherwise within normal limits Confirmed by Carmin Muskrat [phone]) on 08/27/2021 10:17:48 AM  Radiology DG Chest Portable 1 View  Result Date: 08/27/2021 CLINICAL DATA:  66 year old male with history of left-sided chest pain and shortness of breath for the past 3-4 days. EXAM: PORTABLE CHEST 1 VIEW COMPARISON:  Chest x-ray 03/31/2021. FINDINGS: Lung volumes are normal. Mild diffuse peribronchial cuffing and interstitial prominence throughout the mid to lower lungs bilaterally. No consolidative airspace disease. No pleural effusions. No pneumothorax. No pulmonary nodule or mass noted. Pulmonary vasculature and the cardiomediastinal silhouette are within normal limits. Atherosclerosis in the thoracic aorta. IMPRESSION: 1. Mild diffuse peribronchial cuffing throughout the mid to lower lungs bilaterally, concerning for potential acute bronchitis. 2. Aortic atherosclerosis. Electronically Signed   By: Vinnie Langton M.D.   On: 08/27/2021 10:30    Procedures Procedures    Medications Ordered in ED Medications  aspirin chewable tablet 324 mg (0 mg Oral Hold 08/27/21 1036)  albuterol (VENTOLIN HFA) 108 (90 Base) MCG/ACT inhaler 1 puff (has no administration in time range)  methylPREDNISolone sodium succinate (SOLU-MEDROL) 125 mg/2 mL injection 125 mg (125 mg Intravenous  Given 08/27/21 1155)  albuterol (PROVENTIL) (2.5 MG/3ML) 0.083% nebulizer solution 2.5 mg (2.5 mg Nebulization Given 08/27/21 1201)    ED Course/ Medical Decision Making/ A&P This patient with a Hx of CHF, hypertension, hyper lipidemia, diabetes presents to the ED for concern of dyspnea, chest pain, this involves an extensive number of treatment options, and is a complaint that carries with it a high risk of complications and morbidity.    The differential diagnosis includes ACS, CHF exacerbation, pneumonia, bacteremia, sepsis   Social Determinants of Health:  Age, multiple medical comorbidities  Additional history obtained:  Additional history and/or information obtained from chart review, notable for ongoing management of diabetes, hypertension   After the initial evaluation, orders, including: X-ray labs were initiated.   Patient placed on Cardiac and Pulse-Oximetry Monitors. The  patient was maintained on a cardiac monitor.  The cardiac monitored showed an rhythm of 90 sinus normal The patient was also maintained on pulse oximetry. The readings were typically 100% room air normal   On repeat evaluation of the patient stayed the same  Lab Tests:  I personally interpreted labs.  The pertinent results include: 2 normal troponin values, BNP unremarkable, mild hyperglycemia  Imaging Studies ordered:  I independently visualized and interpreted imaging which showed x-ray consistent with possible bronchitis I agree with the radiologist interpretation 1:37 PM Patient has received bronchodilator, steroids, is awake, alert, in no distress, smiling, states that he feels better.  He has no new oxygen requirement.  Dispostion / Final MDM:  After consideration of the diagnostic results and the patient's response to treatment, adult male presents with tightness, shortness of breath.  No evidence for coronary ischemia, 2 normal troponin.  EKG is similarly nonischemic, and the patient's  findings are more consistent with acute bronchitis, without evidence for pneumonia, bacteremia, sepsis. Given his age, risk profile hospitalization was a consideration, but given his substantial improvement here, patient is appropriate for discharge.  Final Clinical Impression(s) / ED Diagnoses Final diagnoses:  Atypical chest pain  Acute bronchitis, unspecified organism    Rx / DC Orders ED Discharge Orders          Ordered    predniSONE (DELTASONE) 20 MG tablet  Daily with breakfast        08/27/21 1336              Carmin Muskrat, MD 08/27/21 1338

## 2021-08-27 NOTE — Discharge Instructions (Addendum)
As discussed, today's evaluation in the ED has resulted in a diagnosis of acute bronchitis.  Please use the provided albuterol inhaler every 4 hours for the next 2 days, then as needed.  In addition, please obtain and take the prescribed steroids for the next 4 days.  Return here for concerning changes in your condition.

## 2021-08-27 NOTE — ED Triage Notes (Signed)
Pt c/o left sided chest pain and SOB x 3-4 days. Reports dizziness a few days ago, none presently. Denies n/v.

## 2021-09-16 ENCOUNTER — Ambulatory Visit (INDEPENDENT_AMBULATORY_CARE_PROVIDER_SITE_OTHER): Payer: Medicare HMO | Admitting: Family Medicine

## 2021-09-16 VITALS — BP 120/60 | HR 56 | Temp 97.8°F | Ht 71.5 in | Wt 223.8 lb

## 2021-09-16 DIAGNOSIS — Z794 Long term (current) use of insulin: Secondary | ICD-10-CM | POA: Diagnosis not present

## 2021-09-16 DIAGNOSIS — E11319 Type 2 diabetes mellitus with unspecified diabetic retinopathy without macular edema: Secondary | ICD-10-CM | POA: Diagnosis not present

## 2021-09-16 MED ORDER — TRULICITY 0.75 MG/0.5ML ~~LOC~~ SOAJ: 0.7500 mg | SUBCUTANEOUS | 3 refills | Status: DC

## 2021-09-16 NOTE — Progress Notes (Signed)
Subjective:    Patient ID: Aaron Fox, male    DOB: 17-Aug-1955, 66 y.o.   MRN: [id number]  HPI Patient went to ER 7/1 with left sided chest pain and dyspnea.  I have reviewed ER note.  Was given albuterol and started on prednisone for bronchitis.  Reviewed CXR and EKG and labs. Troponin negative x 2, BNP normal, CXR with peribronchial cuffing.  Patient states that his breathing is better.  He has not had to use his albuterol since the event that sent him to the emergency room.  This is never happened before however he does work in Biomedical scientist and he believes that the reason it happened was because it is so hot outside and he was breathing and smoke.  He does have a remote history of smoking so he may have some underlying obstructive lung disease but today he is back to his baseline.  His past medical history says that he has type 1 diabetes however his diabetes developed when he was over 61 years of age and he initially was on pills.  Therefore I believe he has type 2 diabetes.  He is inconsistent taking his insulin.  He states that he is taking 70/3010 units twice daily and that his sugars are typically around 160 however I feel that he would be a good candidate for a GLP-1 agonist try to help regulate his blood sugars due to his inconsistency with follow-up.  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 Talbert Forest   CATARACT EXTRACTION, BILATERAL     COLONOSCOPY  2004   LYMPH NODE BIOPSY Right 10/16/2014   Procedure: RIGHT INGUINAL LYMPH NODE BIOPSY;  Surgeon: Aviva Signs, MD;  Location: [facility];  Service: General;  Laterality: Right;   Current Outpatient Medications on File Prior to Visit  Medication Sig Dispense Refill   Accu-Chek Softclix Lancets lancets TEST  7  TIMES  DAILY 600 each 3   Alcohol Swabs (B-D SINGLE USE SWABS REGULAR) PADS Use to cleanse site prior to use of lancet to obtain droplet of blood two times per day; E10.49 200 each 2   aspirin 81 MG tablet Take 81 mg by mouth daily.     atorvastatin (LIPITOR) 10 MG tablet TAKE 1 TABLET EVERY DAY 90 tablet 1   Blood Glucose Monitoring Suppl (ACCU-CHEK  GUIDE ME) w/Device KIT 1 each by Does not apply route See admin instructions. Use to monitor glucose levels 7 times daily; E11.9; WILL NOT COMPLETE PA 1 kit 0   DROPLET INSULIN SYRINGE 29G X 1/2" 1 ML MISC USE IN THE MORNING AND AT BEDTIME 200 each 3   gabapentin (NEURONTIN) 300 MG capsule TAKE 2 CAPSULES THREE TIMES DAILY 540 capsule 2   glucose blood (ACCU-CHEK GUIDE) test strip TEST BLOOD SUGAR 7 TIMES DAILY 600 strip 3   losartan (COZAAR) 50 MG tablet TAKE 1 TABLET EVERY DAY 90 tablet 1   meloxicam (MOBIC) 15 MG tablet Take 1 tablet (15 mg total) by mouth daily. 30 tablet 0   MODERNA COVID-19 BIVALENT 50 MCG/0.5ML injection      mupirocin ointment (BACTROBAN) 2 % Apply 1 application topically 2 (two)  times daily. 30 g 2   NOVOLIN 70/30 KWIKPEN (70-30) 100 UNIT/ML KwikPen INJECT 10 UNITS SUBCUTANEOUSLY TWICE DAILY WITH A MEAL (BREAKFAST AND SUPPER IF GLUCOSE IS ABOVE 90 AND YOU ARE EATING) 15 mL 1   predniSONE (DELTASONE) 20 MG tablet Take 2 tablets (40 mg total) by mouth daily with breakfast. For the next four days 8 tablet 0   Tetrahydrozoline HCl (VISINE OP) Place 1 drop into both eyes daily as needed (redness).     triamcinolone cream (KENALOG) 0.1 % APPLY TO THE AFFECTED AREA(S) TOPICALLY THREE TIMES DAILY AS NEEDED FOR RASH 45 g 2   No current facility-administered medications on file prior to visit.   Allergies  Allergen Reactions   Penicillins Swelling    Swelling of the hands and feet, skin peeling Did it involve swelling of the face/tongue/throat, SOB, or low BP? No Did it involve sudden or severe rash/hives, skin peeling, or any reaction on the inside of your mouth or nose? No Did you need to seek medical attention at a hospital or doctor's office? Yes When did it last happen?      10 + years If all above answers are "NO", may proceed with cephalosporin use.    Ciprofloxacin Hives and Rash   Terbinafine Hcl Hives   Zestril [Lisinopril] Cough   Social History   Socioeconomic History   Marital status: Married    Spouse name: Not on file   Number of children: 3   Years of education: Not on file   Highest education level: Not on file  Occupational History   Occupation: Disability    Employer: [employer]  Tobacco Use   Smoking status: Former    Types: Cigarettes    Quit date: 11/02/1999    Years since quitting: 21.8   Smokeless tobacco: Never  Vaping Use   Vaping Use: Never used  Substance and Sexual Activity   Alcohol use: No    Alcohol/week: 0.0 standard drinks of alcohol   Drug use: No   Sexual activity: Not on file  Other Topics Concern   Not on file  Social History Narrative   ** Merged History Encounter **       Married, 3 kids Prior work [employer]  before disability Regular exercise-yes   Social Determinants of Health   Financial Resource Strain: [place]  (08/27/2020)   Overall Financial Resource Strain (CARDIA)    Difficulty of Paying Living Expenses: Not hard at all  Food Insecurity: No Food Insecurity (08/27/2020)   Hunger Vital Sign    Worried About Running Out of Food in the Last Year: Never true    Ran Out of  Food in the Last Year: Never true  Transportation Needs: No Transportation Needs (08/27/2020)   PRAPARE - Hydrologist (Medical): No    Lack of Transportation (Non-Medical): No  Physical Activity: Sufficiently Active (08/27/2020)   Exercise Vital Sign    Days of Exercise per Week: 7 days    Minutes of Exercise per Session: 30 min  Stress: No Stress Concern Present (08/27/2020)   [place]    Feeling of Stress : Not at all  Social Connections: Moderately Integrated (08/27/2020)   Social Connection and Isolation Panel [NHANES]    Frequency of Communication with Friends and Family: More than three times a week    Frequency of Social Gatherings with Friends and Family: Three times a week    Attends Religious Services: More than 4 times per year    Active Member of Clubs or Organizations: No    Attends Archivist Meetings: Never    Marital Status: Married  Human resources officer Violence: Not At Risk (08/27/2020)   Humiliation, Afraid, Rape, and Kick questionnaire    Fear of Current or Ex-Partner: No    Emotionally Abused: No    Physically Abused: No    Sexually Abused: No     Review of Systems  All other systems reviewed and are negative.      Objective:   Physical Exam Vitals reviewed.  Constitutional:      General: He is not in acute distress.    Appearance: Normal appearance. He is normal weight. He is not ill-appearing, toxic-appearing or diaphoretic.  Cardiovascular:     Rate and Rhythm: Normal rate and regular  rhythm.     Heart sounds: Normal heart sounds. No murmur heard. Pulmonary:     Effort: Pulmonary effort is normal. No respiratory distress.     Breath sounds: Normal breath sounds. No stridor. No wheezing, rhonchi or rales.  Chest:     Chest wall: No deformity, swelling or crepitus.  Abdominal:     General: Abdomen is flat. Bowel sounds are normal.     Palpations: Abdomen is soft.  Musculoskeletal:       Back:           Assessment & Plan:  Type 2 diabetes mellitus with retinopathy, with long-term current use of insulin, macular edema presence unspecified, unspecified laterality, unspecified retinopathy severity (HCC) Recommended adding Trulicity 2.15 mg subcu weekly and then recheck blood sugars in 1 month.  If the patient starts seeing any blood sugars below 100 we will reduce his insulin by 30%.  Regarding his most recent bronchitis, I believe he had a heat induced bronchospasm.  He has a prescription for albuterol.  We will simply monitor to see how often he needs this prior to discussing any maintenance medication

## 2021-09-25 ENCOUNTER — Other Ambulatory Visit: Payer: Self-pay | Admitting: Family Medicine

## 2021-09-28 NOTE — Progress Notes (Signed)
Chronic Care Management Pharmacy Note   Summary:  PharmD FU visit.  Patient has not started Trulicity yet due to wanting to speak with endocrine.  Glucose is up and down per his reports.  Was on prednisone until about 3 weeks ago.  Recommendations: Start your Trulicity after endo visit  FU 6 months  10/06/2021 Name:  Aaron Fox MRN:  [id number] DOB:  Dec 18, 1955  Subjective: Aaron Fox is an 66 y.o. year old male who is a primary patient of Pickard, Cammie Mcgee, MD.  The CCM team was consulted for assistance with disease management and care coordination needs.    Engaged with patient by telephone for follow up visit in response to provider referral for pharmacy case management and/or care coordination services.   Consent to Services:  The patient was given the following information about Chronic Care Management services today, agreed to services, and gave verbal consent: 1. CCM service includes personalized support from designated clinical staff supervised by the primary care provider, including individualized plan of care and coordination with other care providers 2. 24/7 contact phone numbers for assistance for urgent and routine care needs. 3. Service will only be billed when office clinical staff spend 20 minutes or more in a month to coordinate care. 4. Only one practitioner may furnish and bill the service in a calendar month. 5.The patient may stop CCM services at any time (effective at the end of the month) by phone call to the office staff. 6. The patient will be responsible for cost sharing (co-pay) of up to 20% of the service fee (after annual deductible is met). Patient agreed to services and consent obtained.  Patient Care Team: [name], MD as PCP - General (Family Medicine) Edythe Clarity, [facility] as Pharmacist (Pharmacist)  Recent office visits:  None since 12/13/20  Recent consult visits:  12/24/20 Podiatry Orland Dec, DPM. For foot ulcer.  No medication changes. 12/17/20 Endo Brita Romp, NP. For follow-up. STOPPED Doxycyline.    Hospital visits:  None since 12/13/20  Objective:  Lab Results  Component Value Date   CREATININE 1.21 08/27/2021   BUN 18 08/27/2021   GFR 79.79 10/15/2017   GFRNONAA >60 08/27/2021   GFRAA 89 05/07/2020   NA 137 08/27/2021   K 4.0 08/27/2021   CALCIUM 8.9 08/27/2021   CO2 22 08/27/2021   GLUCOSE 262 (H) 08/27/2021    Lab Results  Component Value Date/Time   HGBA1C 7.4 (A) 04/22/2021 08:36 AM   HGBA1C 7.5 (A) 12/17/2020 08:42 AM   GFR 79.79 10/15/2017 08:18 AM   GFR 85.11 04/28/2016 08:04 AM   MICROALBUR 7.9 05/07/2020 08:16 AM   MICROALBUR 17.5 (H) 10/15/2017 08:18 AM    Last diabetic Eye exam:  Lab Results  Component Value Date/Time   HMDIABEYEEXA Retinopathy (A) 03/21/2019 11:16 AM    Last diabetic Foot exam:  Lab Results  Component Value Date/Time   HMDIABFOOTEX yes 06/15/2008 12:00 AM     Lab Results  Component Value Date   CHOL 154 04/15/2021   HDL 48 04/15/2021   LDLCALC 89 04/15/2021   LDLDIRECT 120.2 04/04/2013   TRIG 89 04/15/2021   CHOLHDL 3.2 04/15/2021       Latest Ref Rng & Units 04/15/2021    8:26 AM 12/10/2020    8:47 AM 05/07/2020    8:16 AM  Hepatic Function  Total Protein 6.0 - 8.5 g/dL 7.2  7.6  7.4   Albumin 3.8 - 4.8 g/dL  4.2  4.4    AST 0 - 40 IU/L 52  52  84   ALT 0 - 44 IU/L 37  50  53   Alk Phosphatase 44 - 121 IU/L 116  110    Total Bilirubin 0.0 - 1.2 mg/dL 0.5  0.4  0.5     Lab Results  Component Value Date/Time   TSH 1.490 04/15/2021 08:26 AM   TSH 1.10 10/15/2017 08:18 AM   FREET4 1.32 04/15/2021 08:26 AM       Latest Ref Rng & Units 08/27/2021   10:08 AM 05/07/2020    8:16 AM 05/05/2019   12:36 PM  CBC  WBC 4.0 - 10.5 K/uL 4.4  3.5  4.5   Hemoglobin 13.0 - 17.0 g/dL 12.4  12.5  10.5   Hematocrit 39.0 - 52.0 % 35.6  38.5  31.2   Platelets 150 - 400 K/uL 186  240  244     No results found for:  "VD25OH"  Clinical ASCVD: No  The 10-year ASCVD risk score (Arnett DK, et al., 2019) is: 24.2%   Values used to calculate the score:     Age: 66 years     Sex: Male     Is Non-Hispanic African American: Yes     Diabetic: Yes     Tobacco smoker: No     Systolic Blood Pressure: 161 mmHg     Is BP treated: Yes     HDL Cholesterol: 48 mg/dL     Total Cholesterol: 154 mg/dL       08/27/2020    8:29 AM 05/07/2020    7:58 AM  Depression screen PHQ 2/9  Decreased Interest 0 0  Down, Depressed, Hopeless 0 0  PHQ - 2 Score 0 0     Social History   Tobacco Use  Smoking Status Former   Types: Cigarettes   Quit date: 11/02/1999   Years since quitting: 21.9  Smokeless Tobacco Never   BP Readings from Last 3 Encounters:  09/16/21 120/60  08/27/21 134/69  04/22/21 133/67   Pulse Readings from Last 3 Encounters:  09/16/21 (!) 56  08/27/21 (!) 58  04/22/21 75   Wt Readings from Last 3 Encounters:  09/16/21 223 lb 12.8 oz (101.5 kg)  08/27/21 225 lb (102.1 kg)  04/22/21 223 lb 6.4 oz (101.3 kg)   BMI Readings from Last 3 Encounters:  09/16/21 30.78 kg/m  08/27/21 30.94 kg/m  04/22/21 31.16 kg/m    Assessment/Interventions: Review of patient past medical history, allergies, medications, health status, including review of consultants reports, laboratory and other test data, was performed as part of comprehensive evaluation and provision of chronic care management services.   SDOH:  (Social Determinants of Health) assessments and interventions performed: Yes  Financial Resource Strain: Low Risk  (10/06/2021)   Overall Financial Resource Strain (CARDIA)    Difficulty of Paying Living Expenses: Not hard at all   Food Insecurity: No Food Insecurity (10/06/2021)   Hunger Vital Sign    Worried About Running Out of Food in the Last Year: Never true    Ran Out of Food in the Last Year: Never true    SDOH Screenings   Alcohol Screen: Low Risk  (08/27/2020)   Alcohol Screen     Last Alcohol Screening Score (AUDIT): 0  Depression (PHQ2-9): Low Risk  (08/27/2020)   Depression (PHQ2-9)    PHQ-2 Score: 0  Financial Resource Strain: Low Risk  (10/06/2021)   Overall Financial Resource Strain (  CARDIA)    Difficulty of Paying Living Expenses: Not hard at all  Food Insecurity: No Food Insecurity (10/06/2021)   Hunger Vital Sign    Worried About Running Out of Food in the Last Year: Never true    Ran Out of Food in the Last Year: Never true  Housing: Low Risk  (08/27/2020)   Housing    Last Housing Risk Score: 0  Physical Activity: Sufficiently Active (08/27/2020)   Exercise Vital Sign    Days of Exercise per Week: 7 days    Minutes of Exercise per Session: 30 min  Social Connections: Moderately Integrated (08/27/2020)   Social Connection and Isolation Panel [NHANES]    Frequency of Communication with Friends and Family: More than three times a week    Frequency of Social Gatherings with Friends and Family: Three times a week    Attends Religious Services: More than 4 times per year    Active Member of Clubs or Organizations: No    Attends Archivist Meetings: Never    Marital Status: Married  Stress: No Stress Concern Present (08/27/2020)   [place]    Feeling of Stress : Not at all  Tobacco Use: Medium Risk (08/27/2021)   Patient History    Smoking Tobacco Use: Former    Smokeless Tobacco Use: Never    Passive Exposure: Not on file  Transportation Needs: No Transportation Needs (08/27/2020)   PRAPARE - Hydrologist (Medical): No    Lack of Transportation (Non-Medical): No    CCM Care Plan  Allergies  Allergen Reactions   Penicillins Swelling    Swelling of the hands and feet, skin peeling Did it involve swelling of the face/tongue/throat, SOB, or low BP? No Did it involve sudden or severe rash/hives, skin peeling, or any reaction on the inside of your mouth or  nose? No Did you need to seek medical attention at a hospital or doctor's office? Yes When did it last happen?      10 + years If all above answers are "NO", may proceed with cephalosporin use.    Ciprofloxacin Hives and Rash   Terbinafine Hcl Hives   Zestril [Lisinopril] Cough    Medications Reviewed Today     Reviewed by Edythe Clarity, [facility] (Pharmacist) on 10/06/21 at 1110  Med List Status: <None>   Medication Order Taking? Sig Documenting Provider Last Dose Status Informant  Accu-Chek Softclix Lancets lancets [id number] No TEST  7  TIMES  DAILY Philemon Kingdom, MD Taking Active   Alcohol Swabs (B-D SINGLE USE SWABS REGULAR) PADS [id number] No Use to cleanse site prior to use of lancet to obtain droplet of blood two times per day; E10.49 Renato Shin, MD Taking Active Self  aspirin 81 MG tablet [id number] No Take 81 mg by mouth daily. [provider] Taking Active Self  atorvastatin (LIPITOR) 10 MG tablet [id number] No TAKE 1 TABLET EVERY DAY Pickard, Cammie Mcgee, MD Taking Active   Blood Glucose Monitoring Suppl (ACCU-CHEK GUIDE ME) w/Device KIT [id number] No 1 each by Does not apply route See admin instructions. Use to monitor glucose levels 7 times daily; E11.9; WILL NOT COMPLETE PA [name], MD Taking Active   DROPLET INSULIN SYRINGE 29G X 1/2" 1 ML MISC [id number] No USE IN THE MORNING AND AT BEDTIME Renato Shin, MD Taking Active   Dulaglutide (TRULICITY) 1.00 FH/2.1FX SOPN [id number]  Inject 0.75 mg into the skin  once a week. [name], MD  Active   gabapentin (NEURONTIN) 300 MG capsule [id number] No TAKE 2 CAPSULES THREE TIMES DAILY [name], MD Taking Active   glucose blood (ACCU-CHEK GUIDE) test strip [id number] No TEST BLOOD SUGAR 7 TIMES DAILY [name], MD Taking Active   losartan (COZAAR) 50 MG tablet [id number] No TAKE 1 TABLET EVERY DAY [name], MD Taking Active   meloxicam (MOBIC) 15 MG tablet [id number] No Take 1 tablet (15 mg  total) by mouth daily. [name], MD Taking Active   MODERNA COVID-19 BIVALENT 50 MCG/0.5ML injection [id number] No  [provider] Taking Active   mupirocin ointment (BACTROBAN) 2 % [id number] No Apply 1 application topically 2 (two) times daily. Trula Slade, DPM Taking Active   NOVOLIN 70/30 KWIKPEN (70-30) 100 UNIT/ML KwikPen [id number] No INJECT 10 UNITS SUBCUTANEOUSLY TWICE DAILY WITH A MEAL (BREAKFAST AND SUPPER IF GLUCOSE IS ABOVE 90 AND YOU ARE EATING) Brita Romp, NP Taking Active   predniSONE (DELTASONE) 20 MG tablet [id number] No Take 2 tablets (40 mg total) by mouth daily with breakfast. For the next four days Carmin Muskrat, MD Taking Active   Tetrahydrozoline HCl (VISINE OP) [id number] No Place 1 drop into both eyes daily as needed (redness). [provider] Taking Active Self  triamcinolone cream (KENALOG) 0.1 % [id number]  APPLY TO THE AFFECTED AREA(S) TOPICALLY THREE TIMES DAILY AS NEEDED FOR Baldo Ash, MD  Active             Patient Active Problem List   Diagnosis Date Noted   Osteomyelitis ([place]) 04/25/2019   Diabetic retinopathy of both eyes ([place])    Low back pain 12/17/2017   Irritant contact dermatitis 09/21/2016   Cervical radiculitis 02/08/2016   Hx of adenomatous colonic polyps 06/09/2015   Dyspnea on exertion 05/03/2015   Lymphadenopathy, retroperitoneal 09/17/2014   PSA elevation 05/27/2014   Iron deficiency anemia 05/27/2014   Wellness examination 04/20/2014   Diabetic foot ulcer ([place]) 11/21/2013   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[place]) 27/78/2423   Piriformis syndrome of left side 08/25/2013   Dyspnea 07/24/2013   Knee pain 07/24/2013   Routine general medical examination at a health care facility 07/24/2013   Numbness and tingling in hands 10/14/2012   Encounter for long-term (current) use of other medications 11/13/2011   Disturbance of skin sensation 11/13/2011   Screening for  prostate cancer 11/13/2011   Inflammatory and toxic neuropathy, 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   Shoulder pain, right 11/13/2011   BALANITIS 09/01/2009   HYPOKALEMIA 05/31/2009   Essential hypertension 05/31/2009   LEG PAIN, BILATERAL 02/15/2009   HYPERCHOLESTEROLEMIA 07/02/2008   ALLERGIC RHINITIS 07/02/2008   CHF 06/19/2008   MICROSCOPIC HEMATURIA 06/19/2008   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   Hypogonadism male 01/15/2007   Diabetes ([place]) 09/22/2006   Gout 09/22/2006   RHEUMATOID ARTHRITIS 09/22/2006    Immunization History  Administered Date(s) Administered   Influenza Whole 12/28/2008   Influenza,inj,Quad PF,6+ Mos 11/21/2013, 04/01/2015, 12/20/2015, 01/08/2017, 12/17/2017, 10/25/2018, 12/19/2019, 03/28/2021   Moderna Sars-Covid-2 Vaccination 04/29/2019, 05/27/2019, 12/29/2019   Pneumococcal Polysaccharide-23 06/11/2008   Td 09/29/2005   Tdap 10/15/2017   Zoster Recombinat (Shingrix) 06/14/2020, 08/25/2020    Conditions to be addressed/monitored:  HTN, Type I Diabetes w/ Neuropathy, Hypercholesterolemia  Care Plan : General Pharmacy (Adult)  Updates made by Edythe Clarity, RPH since 10/06/2021 12:00 AM     Problem: HTN, DM, HLD   Priority:  High  Onset Date: 06/11/2020     Long-Range Goal: Patient-Specific Goal   Start Date: 06/11/2020  Expected End Date: 12/11/2020  Recent Progress: On track  Priority: High  Note:   Current Barriers:  Unable to independently monitor therapeutic efficacy Unable to achieve control of glucose   Pharmacist Clinical Goal(s):  Patient will verbalize ability to afford treatment regimen achieve adherence to monitoring guidelines and medication adherence to achieve therapeutic efficacy adhere to prescribed medication regimen as evidenced by fill dates contact provider office for questions/concerns as evidenced notation of same in electronic health record through collaboration with PharmD and provider.   Interventions: 1:1  collaboration with [name], MD regarding development and update of comprehensive plan of care as evidenced by provider attestation and co-signature Inter-disciplinary care team collaboration (see longitudinal plan of care) Comprehensive medication review performed; medication list updated in electronic medical record  Hypertension (BP goal <130/80) 10/06/21 -Controlled -Current treatment: Losartan 43m daily Appropriate, Effective, Safe, Accessible -Medications previously tried: none noted  -Current home readings: does not have meter at home -Current dietary habits: same -Current exercise habits: still helping mow lawns -Educated on BP goals and benefits of medications for prevention of heart attack, stroke and kidney damage; Daily salt intake goal < 2300 mg; Exercise goal of 150 minutes per week; Importance of home blood pressure monitoring; -BP controlled most recently in office.  No need for changes at this time.   Update 10/07/20 Not checking BP at home Office BP's remain controlled - denies any dizziness or HA's at home. 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 Does not check BP at home. Encouraged him to remain active. Last BP in office was elevated, otherwise has been controlled. Continue to monitor for now. No changes in meds needed.  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%)  10/06/21 Uncontrolled -Current medications: Novolin 70/30 10 units BID Appropriate, Query effective, ,  Trulicity 0.2.83TDnce weekly Appropriate, Query effective, ,  -Medications previously tried: Novolin R -Current home glucose readings Glucose has been fluctuating per patient report, no specific logs provided today -Denies hypoglycemic/hyperglycemic symptoms -Current meal patterns: same see previous -Current exercise: same see previous -Educated on A1c and blood sugar goals; Complications of diabetes including kidney damage, retinal damage, and cardiovascular disease; Exercise goal of 150 minutes per week; Prevention and management of hypoglycemic episodes; Benefits of routine self-monitoring of blood sugar;  -He was recently prescribed Trulicity from Dr. PiDennard Schaumann He has not started medication yet as he says he wants to talk to Endocrine first.  Glucose has been fluctuating recently on 10 units of his mixed insulin twice daily.  Cost is not a barrier on Trulicity at this time. Recommend he start the Trulicity as directed, will discuss with endocrine. FU after Endocrine visit.  Continue to monitor glucose since stopping your prednisone. He is due for his diabetic eye exam.  Update 10/07/20 Changed to Novoling 70/30 10 units bid A1c improved to 7.0! Fasting Sugars - 90-150s He is now back to watching what he  eats and sugars are much more controlled. Congratulated on efforts and lifestyle mods. Continue current meds for now.  Update 02/10/21 Fasting Sugars - 126 this morning,  Continues to lay off the sweets Remains on 10 units BID of 70/30 insulin. Denies any hypoglycemia. Currently he states his biggest concern is that his portion sizes are too large. Suggested he use a smaller plate as this sometimes helps you decrease portion size. Last POC A1c was 7.5 which increased from 7.0. Encouraged him to work on on diet/exercise.   He is motivated to get on track and is agreeable to  plan.    Patient Goals/Self-Care Activities Patient will:  - take medications as prescribed check glucose daily, document, and provide at future appointments check blood pressure if able, document, and provide at future appointments target a minimum of 150 minutes of moderate intensity exercise weekly  Follow Up Plan: The care management team will reach out to the patient again over the next 180 days.             Compliance/Adherence/Medication fill history: Care Gaps: Eye exam  Star-Rating Drugs: Atorvastatin 76m 09/14/21 90ds Losartan 552m06/02/23 90ds  Medication Assistance: None required.  Patient affirms current coverage meets needs.  [place] [facility] [place]  [place] [id number] [place] [place] [id number]: [phone]: [phone]: [phone]: [phone] Uses pill box? No - does not miss doses Pt endorses 100% compliance  We discussed: Benefits of medication synchronization, packaging and delivery as well as enhanced pharmacist oversight with [facility]. Patient decided to: Utilize [facility] pharmacy for medication synchronization, packaging and delivery  Care Plan and Follow Up Patient Decision:  Patient agrees to Care Plan and Follow-up.  Plan: The care management team will reach out to the patient again over the next 90 days.  ChBeverly MilchPharmD Clinical Pharmacist Br[place]ountain [phone]

## 2021-10-06 ENCOUNTER — Ambulatory Visit: Payer: Medicare HMO | Admitting: Pharmacist

## 2021-10-06 DIAGNOSIS — I10 Essential (primary) hypertension: Secondary | ICD-10-CM

## 2021-10-06 DIAGNOSIS — E11319 Type 2 diabetes mellitus with unspecified diabetic retinopathy without macular edema: Secondary | ICD-10-CM

## 2021-10-06 NOTE — Patient Instructions (Addendum)
Visit Information   Goals Addressed             This Visit's Progress    Monitor and Manage My Blood Sugar-Diabetes Type II       Timeframe:  Long-Range Goal Priority:  High Start Date:   06/11/20                          Expected End Date:  12/11/20                     Follow Up Date 12/27/20   - check blood sugar at prescribed times - check blood sugar if I feel it is too high or too low - enter blood sugar readings and medication or insulin into daily log - take the blood sugar log to all doctor visits    Why is this important?   Checking your blood sugar at home helps to keep it from getting very high or very low.  Writing the results in a diary or log helps the doctor know how to care for you.  Your blood sugar log should have the time, the date and the results.  Also, write down the amount of insulin or other medicine you take.  Other information like what you ate, exercise done and how you were feeling will also be helpful..     Notes: Goal fasting 80-130  10/07/20 - A1c improved to 7.0!!       Patient Care Plan: General Pharmacy (Adult)     Problem Identified: HTN, DM, HLD   Priority: High  Onset Date: 06/11/2020     Long-Range Goal: Patient-Specific Goal   Start Date: 06/11/2020  Expected End Date: 12/11/2020  Recent Progress: On track  Priority: High  Note:   Current Barriers:  Unable to independently monitor therapeutic efficacy Unable to achieve control of glucose   Pharmacist Clinical Goal(s):  Patient will verbalize ability to afford treatment regimen achieve adherence to monitoring guidelines and medication adherence to achieve therapeutic efficacy adhere to prescribed medication regimen as evidenced by fill dates contact provider office for questions/concerns as evidenced notation of same in electronic health record through collaboration with PharmD and provider.   Interventions: 1:1 collaboration with [name], MD regarding  development and update of comprehensive plan of care as evidenced by provider attestation and co-signature Inter-disciplinary care team collaboration (see longitudinal plan of care) Comprehensive medication review performed; medication list updated in electronic medical record  Hypertension (BP goal <130/80) 10/06/21 -Controlled -Current treatment: Losartan '50mg'$  daily Appropriate, Effective, Safe, Accessible -Medications previously tried: none noted  -Current home readings: does not have meter at home -Current dietary habits: same -Current exercise habits: still helping mow lawns -Educated on BP goals and benefits of medications for prevention of heart attack, stroke and kidney damage; Daily salt intake goal < 2300 mg; Exercise goal of 150 minutes per week; Importance of home blood pressure monitoring; -BP controlled most recently in office.  No need for changes at this time.   Update 10/07/20 Not checking BP at home Office BP's remain controlled - denies any dizziness or HA's at home. 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 Does not check BP at home. Encouraged him to remain active. Last BP in office was elevated, otherwise has been controlled. Continue to monitor for now. No changes in meds needed.  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 Uncontrolled -Current medications: Novolin 70/30 10  units BID Appropriate, Query effective, ,  Trulicity 0.'75mg'$  once weekly Appropriate, Query effective, ,  -Medications previously tried: Novolin R -Current home glucose readings Glucose has been fluctuating per patient report, no specific logs provided today -Denies hypoglycemic/hyperglycemic symptoms -Current meal patterns: same see previous -Current exercise: same see previous -Educated on A1c and blood sugar goals; Complications of diabetes including kidney damage, retinal damage, and cardiovascular disease; Exercise goal of 150 minutes per week; Prevention and management of hypoglycemic episodes; Benefits of routine self-monitoring of blood sugar;  -He was recently prescribed Trulicity from Dr. Dennard Schaumann.  He has not started medication yet as he says he wants to talk to Endocrine first.  Glucose has been fluctuating recently on 10 units of his mixed insulin twice daily.  Cost is not a barrier on Trulicity at this time. Recommend he start the Trulicity as directed, will discuss with endocrine. FU after Endocrine visit.  Continue to monitor glucose since stopping your prednisone. He is due for his diabetic eye exam.  Update 10/07/20 Changed to Novoling 70/30 10 units bid A1c improved to 7.0! Fasting Sugars - 90-150s He is now back to watching what he eats and sugars are much more controlled. Congratulated on efforts and lifestyle mods. Continue current meds for now.  Update 02/10/21 Fasting Sugars - 126 this morning,  Continues to lay off the sweets Remains on 10 units BID of 70/30 insulin. Denies any hypoglycemia. Currently he states his biggest concern is that his portion sizes are too large. Suggested he use a smaller plate as this sometimes helps you decrease portion size. Last POC A1c was 7.5 which increased from 7.0. Encouraged him to work on on diet/exercise.   He is motivated to get on track and is agreeable to plan.    Patient Goals/Self-Care Activities Patient will:   - take medications as prescribed check glucose daily, document, and provide at future appointments check blood pressure if able, document, and provide at future appointments target a minimum of 150 minutes of moderate intensity exercise weekly  Follow Up Plan: The care management team will reach out to the patient again over the next 180 days.            The patient verbalized understanding of instructions, educational materials, and care plan provided today and DECLINED offer to receive copy of patient instructions, educational materials, and care plan.  Telephone follow up appointment with pharmacy team member scheduled for: 6 months  Edythe Clarity, [place], PharmD, [facility] Clinical Pharmacist Practitioner [place] [phone]

## 2021-10-20 ENCOUNTER — Ambulatory Visit: Payer: Medicare HMO | Admitting: Nurse Practitioner

## 2021-10-20 ENCOUNTER — Encounter: Payer: Self-pay | Admitting: Nurse Practitioner

## 2021-10-20 VITALS — BP 121/73 | HR 74 | Ht 71.5 in | Wt 220.0 lb

## 2021-10-20 DIAGNOSIS — E1165 Type 2 diabetes mellitus with hyperglycemia: Secondary | ICD-10-CM | POA: Diagnosis not present

## 2021-10-20 DIAGNOSIS — Z794 Long term (current) use of insulin: Secondary | ICD-10-CM | POA: Diagnosis not present

## 2021-10-20 LAB — POCT GLYCOSYLATED HEMOGLOBIN (HGB A1C): Hemoglobin A1C: 8.1 % — AB (ref 4.0–5.6)

## 2021-10-20 LAB — POCT UA - MICROALBUMIN: Microalbumin Ur, POC: 150 mg/L

## 2021-10-20 NOTE — Progress Notes (Signed)
Endocrinology Follow Up Note       10/20/2021, 8:49 AM   Subjective:    Patient ID: Aaron Fox, male    DOB: 09-Sep-1955.  Aaron Fox is being seen in follow up after being seen in consultation for management of currently uncontrolled symptomatic diabetes requested by  [name], MD.   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 Talbert Forest   CATARACT EXTRACTION, BILATERAL     COLONOSCOPY  2004   LYMPH NODE BIOPSY Right 10/16/2014   Procedure: RIGHT INGUINAL LYMPH NODE BIOPSY;  Surgeon: Aviva Signs, MD;  Location: [facility];  Service: General;  Laterality: Right;    Social History   Socioeconomic History   Marital status: Married    Spouse name: Not on file   Number of children: 3   Years of education: Not on file   Highest  education level: Not on file  Occupational History   Occupation: Disability    Employer: [employer]  Tobacco Use   Smoking status: Former    Types: Cigarettes    Quit date: 11/02/1999    Years since quitting: 21.9   Smokeless tobacco: Never  Vaping Use   Vaping Use: Never used  Substance and Sexual Activity   Alcohol use: No    Alcohol/week: 0.0 standard drinks of alcohol   Drug use: No   Sexual activity: Not on file  Other Topics Concern   Not on file  Social History Narrative   ** Merged History Encounter **       Married, 3 kids Prior work [employer] before disability Regular exercise-yes   Social Determinants of Health   Financial Resource Strain: Low Risk  (10/06/2021)   Overall Financial Resource Strain (CARDIA)    Difficulty of Paying Living Expenses: Not hard at all  Food Insecurity: No Food Insecurity (10/06/2021)   Hunger Vital Sign    Worried About Running Out of Food in the Last Year: Never true    Ran Out of  Food in the Last Year: Never true  Transportation Needs: No Transportation Needs (08/27/2020)   PRAPARE - Hydrologist (Medical): No    Lack of Transportation (Non-Medical): No  Physical Activity: Sufficiently Active (08/27/2020)   Exercise Vital Sign    Days of Exercise per Week: 7 days    Minutes of Exercise per Session: 30 min  Stress: No Stress Concern Present (08/27/2020)   [place]    Feeling of Stress : Not at all  Social Connections: Moderately Integrated (08/27/2020)   Social Connection and Isolation Panel [NHANES]    Frequency of Communication with Friends and Family: More than three times a week    Frequency of Social Gatherings with Friends and Family: Three times a week    Attends Religious Services: More than 4 times per year    Active Member of Clubs or Organizations: No    Attends Archivist Meetings: Never    Marital Status: Married     Family History  Problem Relation Age of Onset   Heart disease Other        FH of CAD   Colon polyps Paternal Uncle    Kidney disease Paternal Uncle    Diabetes Paternal Grandfather    Diabetes Maternal Uncle    Cancer Neg Hx    Colon cancer Neg Hx    Gallbladder disease Neg Hx    Esophageal cancer Neg Hx    Rectal cancer Neg Hx    Stomach cancer Neg Hx     Outpatient Encounter Medications as of 10/20/2021  Medication Sig   Accu-Chek Softclix Lancets lancets TEST  7  TIMES  DAILY   Alcohol Swabs (B-D SINGLE USE SWABS REGULAR) PADS Use to cleanse site prior to use of lancet to obtain droplet of blood two times per day; E10.49   aspirin 81 MG tablet Take 81 mg by mouth daily.   atorvastatin (LIPITOR) 10 MG tablet TAKE 1 TABLET EVERY DAY   Blood Glucose Monitoring Suppl (ACCU-CHEK GUIDE ME) w/Device KIT 1 each by Does not apply route See admin instructions. Use to monitor glucose levels 7 times daily; E11.9; WILL NOT COMPLETE PA   Dulaglutide (TRULICITY) 1.75 ZW/2.5EN SOPN Inject 0.75 mg into the skin once a week. (Patient not taking: Reported on 10/20/2021)   gabapentin (NEURONTIN) 300 MG capsule TAKE 2 CAPSULES THREE TIMES DAILY   glucose blood (ACCU-CHEK GUIDE) test strip TEST BLOOD SUGAR 7 TIMES DAILY   losartan (COZAAR) 50 MG tablet TAKE 1 TABLET EVERY DAY   meloxicam (MOBIC) 15 MG tablet Take 1 tablet (15 mg total) by mouth daily.   mupirocin ointment (BACTROBAN) 2 % Apply 1 application topically 2 (two) times daily.   NOVOLIN 70/30 KWIKPEN (70-30) 100 UNIT/ML KwikPen INJECT 10 UNITS SUBCUTANEOUSLY TWICE DAILY WITH A MEAL (BREAKFAST AND SUPPER IF GLUCOSE IS ABOVE 90 AND YOU ARE EATING)   Tetrahydrozoline HCl (VISINE OP) Place 1 drop into both eyes daily as needed (redness).   triamcinolone cream (KENALOG) 0.1 % APPLY TO THE AFFECTED AREA(S) TOPICALLY THREE TIMES DAILY AS NEEDED FOR RASH   [DISCONTINUED] DROPLET INSULIN SYRINGE 29G X 1/2" 1 ML MISC USE IN THE MORNING AND AT  BEDTIME   [DISCONTINUED] MODERNA COVID-19 BIVALENT 50 MCG/0.5ML injection    [DISCONTINUED] predniSONE (DELTASONE) 20 MG tablet Take 2 tablets (40 mg total) by mouth daily with breakfast. For the next four days   No facility-administered encounter medications  on file as of 10/20/2021.    ALLERGIES: Allergies  Allergen Reactions   Penicillins Swelling    Swelling of the hands and feet, skin peeling Did it involve swelling of the face/tongue/throat, SOB, or low BP? No Did it involve sudden or severe rash/hives, skin peeling, or any reaction on the inside of your mouth or nose? No Did you need to seek medical attention at a hospital or doctor's office? Yes When did it last happen?      10 + years If all above answers are "NO", may proceed with cephalosporin use.    Ciprofloxacin Hives and Rash   Terbinafine Hcl Hives   Zestril [Lisinopril] Cough    VACCINATION STATUS: Immunization History  Administered Date(s) Administered   Influenza Whole 12/28/2008   Influenza,inj,Quad PF,6+ Mos 11/21/2013, 04/01/2015, 12/20/2015, 01/08/2017, 12/17/2017, 10/25/2018, 12/19/2019, 03/28/2021   Moderna Sars-Covid-2 Vaccination 04/29/2019, 05/27/2019, 12/29/2019   Pneumococcal Polysaccharide-23 06/11/2008   Td 09/29/2005   Tdap 10/15/2017   Zoster Recombinat (Shingrix) 06/14/2020, 08/25/2020    Diabetes He presents for his follow-up diabetic visit. He has type 2 diabetes mellitus. Onset time: was diagnosed at approx age of 67. His disease course has been worsening. There are no hypoglycemic associated symptoms. Associated symptoms include foot paresthesias. Pertinent negatives for diabetes include no blurred vision, no chest pain, no fatigue, no polydipsia, no polyphagia, no polyuria and no weight loss. There are no hypoglycemic complications. Symptoms are stable. Diabetic complications include heart disease, impotence, nephropathy, peripheral neuropathy and retinopathy. (Had right TMA for osteomyelitis  secondary to diabetic foot ulcer) Risk factors for coronary artery disease include diabetes mellitus, dyslipidemia, male sex and hypertension. Current diabetic treatment includes insulin injections. He is compliant with treatment most of the time. His weight is decreasing steadily. He is following a generally healthy diet. When asked about meal planning, he reported none. He has not had a previous visit with a dietitian. He participates in exercise three times a week. His home blood glucose trend is increasing steadily. His breakfast blood glucose range is generally 140-180 mg/dl. His overall blood glucose range is 140-180 mg/dl. (He presents today with his meter and logs showing above target fasting glycemic profile and at goal postprandial readings. His POCT A1c today is 8.1%, increasing from last visit of 7.4%.  He did have steroid injection since last visit and has also recently lost a son unexpectedly.  Analysis of his meter shows 7-day average of 163, 14-day average of 162, 30-day average of 157, 90-day average of 161.  He denies any hypoglycemia.  He does note that his PCP wanted to start him on Trulicity but he wanted to see our opinion on this prior to starting it.) An ACE inhibitor/angiotensin II receptor blocker is being taken. He sees a podiatrist.Eye exam is current.  Hyperlipidemia This is a chronic problem. The current episode started more than 1 year ago. The problem is controlled. Recent lipid tests were reviewed and are normal. Exacerbating diseases include chronic renal disease and diabetes. There are no known factors aggravating his hyperlipidemia. Pertinent negatives include no chest pain. Current antihyperlipidemic treatment includes statins. The current treatment provides moderate improvement of lipids. There are no compliance problems.  Risk factors for coronary artery disease include diabetes mellitus, dyslipidemia, hypertension and male sex.  Hypertension This is a chronic problem. The  current episode started more than 1 year ago. The problem has been gradually improving since onset. The problem is controlled. Pertinent negatives include no blurred vision or chest pain. There  are no associated agents to hypertension. Risk factors for coronary artery disease include diabetes mellitus, dyslipidemia and male gender. Past treatments include angiotensin blockers. The current treatment provides moderate improvement. There are no compliance problems.  Hypertensive end-organ damage includes kidney disease, heart failure and retinopathy. Identifiable causes of hypertension include chronic renal disease.    Review of systems  Constitutional: + Minimally fluctuating body weight,  current Body mass index is 30.26 kg/m. , no fatigue, no subjective hyperthermia, no subjective hypothermia Eyes: no blurry vision, no xerophthalmia ENT: no sore throat, no nodules palpated in throat, no dysphagia/odynophagia, no hoarseness Cardiovascular: no chest pain, no shortness of breath, no palpitations, no leg swelling Respiratory: no cough, no shortness of breath Gastrointestinal: no nausea/vomiting/diarrhea Musculoskeletal: no muscle/joint aches Skin: no rashes, no hyperemia Neurological: no tremors, no numbness, no tingling, no dizziness Psychiatric: no depression, no anxiety, recently lost his son unexpectedly  Objective:     BP 121/73   Pulse 74   Ht 5' 11.5" (1.816 m)   Wt 220 lb (99.8 kg)   BMI 30.26 kg/m   Wt Readings from Last 3 Encounters:  10/20/21 220 lb (99.8 kg)  09/16/21 223 lb 12.8 oz (101.5 kg)  08/27/21 225 lb (102.1 kg)     BP Readings from Last 3 Encounters:  10/20/21 121/73  09/16/21 120/60  08/27/21 134/69      Physical Exam- Limited  Constitutional:  Body mass index is 30.26 kg/m. , not in acute distress, normal state of mind Eyes:  EOMI, no exophthalmos Neck: Supple Cardiovascular: RRR, no murmurs, rubs, or gallops, no edema Respiratory: Adequate breathing  efforts, no crackles, rales, rhonchi, or wheezing Musculoskeletal: no gross deformities, strength intact in all four extremities, no gross restriction of joint movements Skin:  no rashes, no hyperemia Neurological: no tremor with outstretched hands    CMP ( most recent) CMP     Component Value Date/Time   NA 137 08/27/2021 1008   NA 141 04/15/2021 0826   K 4.0 08/27/2021 1008   CL 109 08/27/2021 1008   CO2 22 08/27/2021 1008   GLUCOSE 262 (H) 08/27/2021 1008   BUN 18 08/27/2021 1008   BUN 19 04/15/2021 0826   CREATININE 1.21 08/27/2021 1008   CREATININE 1.03 05/07/2020 0816   CALCIUM 8.9 08/27/2021 1008   PROT 7.2 04/15/2021 0826   ALBUMIN 4.2 04/15/2021 0826   AST 52 (H) 04/15/2021 0826   ALT 37 04/15/2021 0826   ALKPHOS 116 04/15/2021 0826   BILITOT 0.5 04/15/2021 0826   GFRNONAA >60 08/27/2021 1008   GFRNONAA 76 05/07/2020 0816   GFRAA 89 05/07/2020 0816     Diabetic Labs (most recent): Lab Results  Component Value Date   HGBA1C 7.4 (A) 04/22/2021   HGBA1C 7.5 (A) 12/17/2020   HGBA1C 7.0 08/06/2020   MICROALBUR 7.9 05/07/2020   MICROALBUR 17.5 (H) 10/15/2017   MICROALBUR 46.1 (H) 12/20/2015     Lipid Panel ( most recent) Lipid Panel     Component Value Date/Time   CHOL 154 04/15/2021 0826   TRIG 89 04/15/2021 0826   HDL 48 04/15/2021 0826   CHOLHDL 3.2 04/15/2021 0826   CHOLHDL 2.7 05/07/2020 0816   VLDL 33.0 10/15/2017 0818   LDLCALC 89 04/15/2021 0826   LDLCALC 65 05/07/2020 0816   LDLDIRECT 120.2 04/04/2013 0918   LABVLDL 17 04/15/2021 0826      Lab Results  Component Value Date   TSH 1.490 04/15/2021   TSH 1.10 10/15/2017   TSH  1.63 04/28/2016   TSH 0.99 05/21/2014   TSH 1.43 04/04/2013   TSH 1.28 11/13/2011   TSH 0.77 03/29/2010   TSH 0.95 09/01/2009   TSH 0.71 02/08/2009   TSH 0.75 02/07/2008   FREET4 1.32 04/15/2021           Assessment & Plan:   1) Uncontrolled Type 2 Diabetes with hyperglycemia  He presents today with  his meter and logs showing above target fasting glycemic profile and at goal postprandial readings. His POCT A1c today is 8.1%, increasing from last visit of 7.4%.  He did have steroid injection since last visit and has also recently lost a son unexpectedly.  Analysis of his meter shows 7-day average of 163, 14-day average of 162, 30-day average of 157, 90-day average of 161.  He denies any hypoglycemia.  He does note that his PCP wanted to start him on Trulicity but he wanted to see our opinion on this prior to starting it.  - Aaron Fox has currently uncontrolled symptomatic type 2 DM since 66 years of age.  -Recent labs reviewed.  - I had a long discussion with him about the progressive nature of diabetes and the pathology behind its complications. -his diabetes is complicated by retinopathy, CKD, neuropathy, recent TMA and he remains at a high risk for more acute and chronic complications which include CAD, CVA, CKD, retinopathy, and neuropathy. These are all discussed in detail with him.  - Nutritional counseling repeated at each appointment due to patients tendency to fall back in to old habits.  - The patient admits there is a room for improvement in their diet and drink choices. -  Suggestion is made for the patient to avoid simple carbohydrates from their diet including Cakes, Sweet Desserts / Pastries, Ice Cream, Soda (diet and regular), Sweet Tea, Candies, Chips, Cookies, Sweet Pastries, Store Bought Juices, Alcohol in Excess of 1-2 drinks a day, Artificial Sweeteners, Coffee Creamer, and "Sugar-free" Products. This will help patient to have stable blood glucose profile and potentially avoid unintended weight gain.   - I encouraged the patient to switch to unprocessed or minimally processed complex starch and increased protein intake (animal or plant source), fruits, and vegetables.   - Patient is advised to stick to a routine mealtimes to eat 3 meals a day and avoid unnecessary  snacks (to snack only to correct hypoglycemia).  - I have approached him with the following individualized plan to manage  his diabetes and patient agrees:   -He is advised to continue his 70/30 insulin at 10 units twice daily with meals if glucose is above 90 and he is eating.  We discussed the benefits of starting Trulicity with long term goal to be able to discontinue insulin in the future.  He is advised to start Trulicity 8.33 mg SQ weekly x 1 month and then increase to therapeutic dose of 1.5 mg SQ weekly thereafter if tolerated well.  This medication is a bit more expensive for him.    -he is encouraged to continue monitoring blood glucose 3 times daily, before injecting insulin (at breakfast and supper) and before bed, and to call the clinic if he has readings less than 70 or greater than 300 for 3 tests in a row.   - he is warned not to take insulin without proper monitoring per orders. - Adjustment parameters are given to him for hypo and hyperglycemia in writing.  - Specific targets for  A1c;  LDL, HDL,  and Triglycerides  were discussed with the patient.  2) Blood Pressure /Hypertension:  his blood pressure is controlled to target.   he is advised to continue his current medications including Cozaar 50 mg p.o. daily with breakfast.  3) Lipids/Hyperlipidemia:    Review of his recent lipid panel from 04/15/21 showed controlled  LDL at 89 .  he  is advised to continue Lipitor 10 mg daily at bedtime.  Side effects and precautions discussed with him.    4)  Weight/Diet:  his Body mass index is 30.26 kg/m.  -   clearly complicating his diabetes care.   he is a candidate for weight loss. I discussed with him the fact that loss of 5 - 10% of his  current body weight will have the most impact on his diabetes management.  Exercise, and detailed carbohydrates information provided  -  detailed on discharge instructions.  5) Chronic Care/Health Maintenance: -he is on ACEI/ARB and Statin  medications and is encouraged to initiate and continue to follow up with Ophthalmology, Dentist, Podiatrist at least yearly or according to recommendations, and advised to stay away from smoking. I have recommended yearly flu vaccine and pneumonia vaccine at least every 5 years; moderate intensity exercise for up to 150 minutes weekly; and sleep for at least 7 hours a day.  - he is advised to maintain close follow up with [name], MD for primary care needs, as well as his other providers for optimal and coordinated care.     I spent 34 minutes in the care of the patient today including review of labs from [place], Lipids, Thyroid Function, Hematology (current and previous including abstractions from other facilities); face-to-face time discussing  his blood glucose readings/logs, discussing hypoglycemia and hyperglycemia episodes and symptoms, medications doses, his options of short and long term treatment based on the latest standards of care / guidelines;  discussion about incorporating lifestyle medicine;  and documenting the encounter. Risk reduction counseling performed per USPSTF guidelines to reduce obesity and cardiovascular risk factors.     Please refer to Patient Instructions for Blood Glucose Monitoring and Insulin/Medications Dosing Guide"  in media tab for additional information. Please  also refer to " Patient Self Inventory" in the Media  tab for reviewed elements of pertinent patient history.  [place] participated in the discussions, expressed understanding, and voiced agreement with the above plans.  All questions were answered to his satisfaction. he is encouraged to contact clinic should he have any questions or concerns prior to his return visit.   Follow up plan: - Return in about 3 months (around 01/20/2022) for Diabetes F/U with A1c in office, No previsit labs, Bring meter and logs.  [name], [facility] [address] [place], [place] [address] Phone: [phone] Fax: [phone]  10/20/2021, 8:49 AM

## 2021-10-29 ENCOUNTER — Other Ambulatory Visit: Payer: Self-pay | Admitting: Family Medicine

## 2021-10-29 DIAGNOSIS — E11319 Type 2 diabetes mellitus with unspecified diabetic retinopathy without macular edema: Secondary | ICD-10-CM

## 2021-11-15 ENCOUNTER — Telehealth: Payer: Self-pay | Admitting: Pharmacist

## 2021-11-15 NOTE — Progress Notes (Signed)
Chronic Care Management Pharmacy Assistant   Name: Aaron Fox  MRN: [id number] DOB: [birth year]   Reason for Encounter: Disease State - Diabetes Call      Recent office visits:  None noted.  Recent consult visits:  10/20/21 Aaron Pigg, NP - Endocrinology - Diabetes - Labs were ordered. start Trulicity 5.59 mg SQ weekly x 1 month and then increase to therapeutic dose of 1.5 mg SQ weekly  Follow up in 3 months.   Hospital visits: 08/27/21 Medication Reconciliation was completed by comparing discharge summary, patient's EMR and Pharmacy list, and upon discussion with patient.  Admitted to the hospital on 08/27/21 due to Chest pain. Discharge date was 08/27/21. Discharged from [facility]?Medications Started at [facility] Discharge:?? predniSONE (DELTASONE) 20 MG tablet  Medication Changes at Hospital Discharge: None noted  Medications Discontinued at Hospital Discharge: None noted  Medications that remain the same after Hospital Discharge:??  All other medications will remain the same.     Medications: Outpatient Encounter Medications as of 11/15/2021  Medication Sig   ACCU-CHEK GUIDE test strip TEST BLOOD SUGAR 7 TIMES DAILY   Accu-Chek Softclix Lancets lancets TEST  7  TIMES  DAILY   Alcohol Swabs (B-D SINGLE USE SWABS REGULAR) PADS Use to cleanse site prior to use of lancet to obtain droplet of blood two times per day; E10.49   aspirin 81 MG tablet Take 81 mg by mouth daily.   atorvastatin (LIPITOR) 10 MG tablet TAKE 1 TABLET EVERY DAY   Blood Glucose Monitoring Suppl (ACCU-CHEK GUIDE ME) w/Device KIT 1 each by Does not apply route See admin instructions. Use to monitor glucose levels 7 times daily; E11.9; WILL NOT COMPLETE PA   Dulaglutide (TRULICITY) 7.41 UL/8.4TX SOPN Inject 0.75 mg into the skin once a week. (Patient not taking: Reported on 10/20/2021)   gabapentin (NEURONTIN) 300 MG capsule TAKE 2 CAPSULES THREE TIMES DAILY   losartan (COZAAR) 50  MG tablet TAKE 1 TABLET EVERY DAY   meloxicam (MOBIC) 15 MG tablet Take 1 tablet (15 mg total) by mouth daily.   mupirocin ointment (BACTROBAN) 2 % Apply 1 application topically 2 (two) times daily.   NOVOLIN 70/30 KWIKPEN (70-30) 100 UNIT/ML KwikPen INJECT 10 UNITS SUBCUTANEOUSLY TWICE DAILY WITH A MEAL (BREAKFAST AND SUPPER IF GLUCOSE IS ABOVE 90 AND YOU ARE EATING)   Tetrahydrozoline HCl (VISINE OP) Place 1 drop into both eyes daily as needed (redness).   triamcinolone cream (KENALOG) 0.1 % APPLY TO THE AFFECTED AREA(S) TOPICALLY THREE TIMES DAILY AS NEEDED FOR RASH   No facility-administered encounter medications on file as of 11/15/2021.    Current antihyperglycemic regimen:  Novolin 70/30 10 units BID  Trulicity 6.46OE  What recent interventions/DTPs have been made to improve glycemic control:  Patient has recently started Trulicity 2 weeks ago and is toleration  Have there been any recent hospitalizations or ED visits since last visit with CPP?  Patient had an ED visits on 08/27/21 for Chest pain  Patient denies hypoglycemic symptoms, including Pale, Sweaty, Shaky, Hungry, Nervous/irritable, and Vision changes   Patient denies hyperglycemic symptoms, including blurry vision, excessive thirst, fatigue, polyuria, and weakness   How often are you checking your blood sugar? Patient reported checking blood sugars   What are your blood sugars ranging?  Fasting: 87-116 Before meals:  After meals: 200 Bedtime:   During the week, how often does your blood glucose drop below 70?  Patient reported no blood sugar readings  below 70.  Are you checking your feet daily/regularly? Patient reported checking feet regularly. He is in the process of getting diabetic shoes.    Adherence Review: Is the patient currently on a STATIN medication? No Is the patient currently on ACE/ARB medication? Yes Does the patient have >5 day gap between last estimated fill dates? No   Care Gaps   AWV:  done 08/27/20 Colonoscopy: done 11/13/18 DM Eye Exam: due 03/20/20 DM Foot Exam: due 04/09/21 Microalbumin: done 05/07/20 HbgAIC: done 10/20/21 (8.1) DEXA: N/A Mammogram: N/A   Star Rating Drugs: Losartan 50 mg - last filled 10/10/21 90 days   Future Appointments  Date Time Provider [place]  11/25/2021  7:30 AM Aaron Fox, DPM [facility]  01/27/2022  8:30 AM Aaron Romp, NP [facility] None  04/27/2022  2:00 PM [facility] PHARMACIST [facility]     Aaron Fox, [place] Clinical Pharmacist Assistant  [phone]

## 2021-11-25 ENCOUNTER — Ambulatory Visit: Payer: Medicare HMO | Admitting: Podiatry

## 2021-11-25 DIAGNOSIS — M79675 Pain in left toe(s): Secondary | ICD-10-CM

## 2021-11-25 DIAGNOSIS — E1149 Type 2 diabetes mellitus with other diabetic neurological complication: Secondary | ICD-10-CM | POA: Diagnosis not present

## 2021-11-25 DIAGNOSIS — B351 Tinea unguium: Secondary | ICD-10-CM

## 2021-11-25 DIAGNOSIS — L84 Corns and callosities: Secondary | ICD-10-CM

## 2021-11-25 NOTE — Progress Notes (Signed)
Subjective: 66 year old male presents the office today for thick, elongated toenails that he cannot trim himself on his left foot.  Also states he has developed a callus on the right foot.  Still awaiting diabetic shoes.  No open lesions that he reports.  No swelling or redness.  He has no other concerns.    Objective: AAO x3, NAD DP/PT pulses palpable bilaterally, CRT less than 3 seconds Hammertoes present.  Hyperkeratotic tissue noted on the plantar medial aspect of the right foot on the transmetatarsal amputation site.  There is no underlying ulceration drainage or any signs of infection. The nails on the left foot are hypertrophic, dystrophic with brown discoloration.  They do cause discomfort as they get elongated.  No edema, erythema or signs of infection.  No open lesions bilaterally. No pain with calf compression, swelling, warmth, erythema  Assessment: Symptomatic onychomycosis left side with preulcerative callus right foot  Plan: -All treatment options discussed with the patient including all alternatives, risks, complications.  -Sharply debrided the nails in the left foot x5 without any complications or bleeding. -On the right side obstructed debrided hyperkeratotic lesion x1 without any complications or bleeding.   -Wearing diabetic shoes and Hoka's to help offload the callus.  Recommended moisturizer and offloading.  RTC 3 months or sooner if needed  Trula Slade DPM

## 2021-12-01 ENCOUNTER — Telehealth: Payer: Self-pay | Admitting: Pharmacist

## 2021-12-01 NOTE — Progress Notes (Signed)
error 

## 2021-12-22 ENCOUNTER — Other Ambulatory Visit: Payer: Self-pay | Admitting: Family Medicine

## 2022-01-27 ENCOUNTER — Encounter: Payer: Self-pay | Admitting: *Deleted

## 2022-01-27 ENCOUNTER — Encounter: Payer: Self-pay | Admitting: Nurse Practitioner

## 2022-01-27 ENCOUNTER — Ambulatory Visit: Payer: Medicare HMO | Admitting: Nurse Practitioner

## 2022-01-27 ENCOUNTER — Telehealth: Payer: Self-pay | Admitting: *Deleted

## 2022-01-27 VITALS — BP 115/72 | HR 73 | Ht 71.5 in | Wt 225.4 lb

## 2022-01-27 DIAGNOSIS — Z794 Long term (current) use of insulin: Secondary | ICD-10-CM | POA: Diagnosis not present

## 2022-01-27 DIAGNOSIS — E1165 Type 2 diabetes mellitus with hyperglycemia: Secondary | ICD-10-CM | POA: Diagnosis not present

## 2022-01-27 DIAGNOSIS — E119 Type 2 diabetes mellitus without complications: Secondary | ICD-10-CM | POA: Diagnosis not present

## 2022-01-27 LAB — POCT GLYCOSYLATED HEMOGLOBIN (HGB A1C): Hemoglobin A1C: 7.5 % — AB (ref 4.0–5.6)

## 2022-01-27 MED ORDER — TRULICITY 1.5 MG/0.5ML ~~LOC~~ SOAJ: 1.5000 mg | SUBCUTANEOUS | 1 refills | Status: DC

## 2022-01-27 MED ORDER — NOVOLIN 70/30 FLEXPEN RELION (70-30) 100 UNIT/ML ~~LOC~~ SUPN: 5.0000 [IU] | PEN_INJECTOR | Freq: Two times a day (BID) | SUBCUTANEOUS | 1 refills | Status: DC

## 2022-01-27 NOTE — Patient Outreach (Signed)
  Care Coordination   Initial Visit Note   01/27/2022 Name: ANDRIEL OMALLEY MRN: [id number] DOB: Jul 11, 1955  Gail DUWANE GEWIRTZ is a 66 y.o. year old male who sees Pickard, Cammie Mcgee, MD for primary care. I spoke with  Mat Carne by phone today.  What matters to the patients health and wellness today?  NOTHING NEW, JUST KEEP TAKING CARE OF MY DM.   SDOH assessments and interventions completed:  Yes  SDOH Interventions Today    Flowsheet Row Most Recent Value  SDOH Interventions   Housing Interventions Intervention Not Indicated  Transportation Interventions Intervention Not Indicated  Utilities Interventions Intervention Not Indicated  Financial Strain Interventions Intervention Not Indicated  Physical Activity Interventions Intervention Not Indicated        Care Coordination Interventions:  Yes, provided   Follow up plan: No further intervention required.   Encounter Outcome:  Pt. Visit Completed   Kayleen Memos C. Myrtie Neither, MSN, [facility] Gerontological Nurse Practitioner [facility] Care Management [phone]

## 2022-01-27 NOTE — Progress Notes (Signed)
Endocrinology Follow Up Note       01/27/2022, 8:41 AM   Subjective:    Patient ID: Aaron Fox, male    DOB: [birth year].  Aaron Fox is being seen in follow up after being seen in consultation for management of currently uncontrolled symptomatic diabetes requested by  [name], MD.   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 Talbert Forest   CATARACT EXTRACTION, BILATERAL     COLONOSCOPY  2004   LYMPH NODE BIOPSY Right 10/16/2014   Procedure: RIGHT INGUINAL LYMPH NODE BIOPSY;  Surgeon: Aviva Signs, MD;  Location: [facility];  Service: General;  Laterality: Right;    Social History   Socioeconomic History   Marital status: Married    Spouse name: Not on file   Number of children: 3   Years of education: Not on file   Highest  education level: Not on file  Occupational History   Occupation: Disability    Employer: [employer]  Tobacco Use   Smoking status: Former    Types: Cigarettes    Quit date: 11/02/1999    Years since quitting: 22.2   Smokeless tobacco: Never  Vaping Use   Vaping Use: Never used  Substance and Sexual Activity   Alcohol use: No    Alcohol/week: 0.0 standard drinks of alcohol   Drug use: No   Sexual activity: Not on file  Other Topics Concern   Not on file  Social History Narrative   ** Merged History Encounter **       Married, 3 kids Prior work [employer] before disability Regular exercise-yes   Social Determinants of Health   Financial Resource Strain: Low Risk  (10/06/2021)   Overall Financial Resource Strain (CARDIA)    Difficulty of Paying Living Expenses: Not hard at all  Food Insecurity: No Food Insecurity (10/06/2021)   Hunger Vital Sign    Worried About Running Out of Food in the Last Year: Never true    Ran Out of  Food in the Last Year: Never true  Transportation Needs: No Transportation Needs (08/27/2020)   PRAPARE - Hydrologist (Medical): No    Lack of Transportation (Non-Medical): No  Physical Activity: Sufficiently Active (08/27/2020)   Exercise Vital Sign    Days of Exercise per Week: 7 days    Minutes of Exercise per Session: 30 min  Stress: No Stress Concern Present (08/27/2020)   [place]    Feeling of Stress : Not at all  Social Connections: Moderately Integrated (08/27/2020)   Social Connection and Isolation Panel [NHANES]    Frequency of Communication with Friends and Family: More than three times a week    Frequency of Social Gatherings with Friends and Family: Three times a week    Attends Religious Services: More than 4 times per year    Active Member of Clubs or Organizations: No    Attends Archivist Meetings: Never    Marital Status: Married     Family History  Problem Relation Age of Onset   Heart disease Other        FH of CAD   Colon polyps Paternal Uncle    Kidney disease Paternal Uncle    Diabetes Paternal Grandfather    Diabetes Maternal Uncle    Cancer Neg Hx    Colon cancer Neg Hx    Gallbladder disease Neg Hx    Esophageal cancer Neg Hx    Rectal cancer Neg Hx    Stomach cancer Neg Hx     Outpatient Encounter Medications as of 01/27/2022  Medication Sig   ACCU-CHEK GUIDE test strip TEST BLOOD SUGAR 7 TIMES DAILY   Accu-Chek Softclix Lancets lancets TEST  7  TIMES  DAILY   Alcohol Swabs (B-D SINGLE USE SWABS REGULAR) PADS Use to cleanse site prior to use of lancet to obtain droplet of blood two times per day; E10.49   aspirin 81 MG tablet Take 81 mg by mouth daily.   atorvastatin (LIPITOR) 10 MG tablet TAKE 1 TABLET EVERY DAY   Blood Glucose Monitoring Suppl (ACCU-CHEK GUIDE ME) w/Device KIT 1 each by Does not apply route See admin instructions. Use to monitor glucose levels 7 times daily; E11.9; WILL NOT COMPLETE PA   Dulaglutide (TRULICITY) 1.5 HD/6.2IW SOPN Inject 1.5 mg into the skin once a week.   gabapentin (NEURONTIN) 300 MG capsule TAKE 2 CAPSULES THREE TIMES DAILY   losartan (COZAAR) 50 MG tablet TAKE 1 TABLET EVERY DAY   meloxicam (MOBIC) 15 MG tablet Take 1 tablet (15 mg total) by mouth daily.   mupirocin ointment (BACTROBAN) 2 % Apply 1 application topically 2 (two) times daily.   Tetrahydrozoline HCl (VISINE OP) Place 1 drop into both eyes daily as needed (redness).   triamcinolone cream (KENALOG) 0.1 % APPLY TO THE AFFECTED AREA(S) TOPICALLY THREE TIMES DAILY AS NEEDED FOR RASH   [DISCONTINUED] Dulaglutide (TRULICITY) 9.79 GX/2.1JH SOPN Inject 0.75 mg into the skin once a week.   [DISCONTINUED] NOVOLIN 70/30 KWIKPEN (70-30) 100 UNIT/ML KwikPen INJECT 10 UNITS SUBCUTANEOUSLY TWICE DAILY WITH A MEAL (BREAKFAST AND SUPPER IF GLUCOSE IS ABOVE 90 AND YOU ARE EATING)   insulin isophane & regular human  KwikPen (NOVOLIN 70/30 KWIKPEN) (70-30) 100 UNIT/ML KwikPen Inject 5 Units into the skin 2 (two) times daily before a meal.   No facility-administered encounter medications on file as of 01/27/2022.    ALLERGIES: Allergies  Allergen Reactions  Penicillins Swelling    Swelling of the hands and feet, skin peeling Did it involve swelling of the face/tongue/throat, SOB, or low BP? No Did it involve sudden or severe rash/hives, skin peeling, or any reaction on the inside of your mouth or nose? No Did you need to seek medical attention at a hospital or doctor's office? Yes When did it last happen?      10 + years If all above answers are "NO", may proceed with cephalosporin use.    Ciprofloxacin Hives and Rash   Terbinafine Hcl Hives   Zestril [Lisinopril] Cough    VACCINATION STATUS: Immunization History  Administered Date(s) Administered   Influenza Whole 12/28/2008   Influenza,inj,Quad PF,6+ Mos 11/21/2013, 04/01/2015, 12/20/2015, 01/08/2017, 12/17/2017, 10/25/2018, 12/19/2019, 03/28/2021   Moderna Sars-Covid-2 Vaccination 04/29/2019, 05/27/2019, 12/29/2019   Pneumococcal Polysaccharide-23 06/11/2008   Td 09/29/2005   Tdap 10/15/2017   Zoster Recombinat (Shingrix) 06/14/2020, 08/25/2020    Diabetes He presents for his follow-up diabetic visit. He has type 2 diabetes mellitus. Onset time: was diagnosed at approx age of 14. His disease course has been improving. Hypoglycemia symptoms include sweats and tremors. Associated symptoms include foot paresthesias. Pertinent negatives for diabetes include no blurred vision, no chest pain, no fatigue, no polydipsia, no polyphagia, no polyuria and no weight loss. There are no hypoglycemic complications. Symptoms are stable. Diabetic complications include heart disease, impotence, nephropathy, peripheral neuropathy and retinopathy. 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 Current diabetic treatment includes insulin injections (and Trulicity). He is compliant with treatment most of the time. His weight is decreasing steadily. He is following a generally healthy diet. When asked about meal planning, he reported none. He has not had a previous visit with a dietitian. He participates in exercise three times a week. His home blood glucose trend is decreasing steadily. His breakfast blood glucose range is generally 130-140 mg/dl. (He presents today with his logs, no meter, showing stable, at target glycemic profile overall.  His POCT A1c today is 7.5%, improving from last visit of 8.1%.  He did note some afternoon drops in glucose right after starting the Trulicity which have resolved.  ) An ACE inhibitor/angiotensin II receptor blocker is being taken. He sees a podiatrist.Eye exam is current.  Hyperlipidemia This is a chronic problem. The current episode started more than 1 year ago. The problem is controlled. Recent lipid tests were reviewed and are normal. Exacerbating diseases include chronic renal disease and diabetes. There are no known factors aggravating his hyperlipidemia. Pertinent negatives include no chest pain. Current antihyperlipidemic treatment includes statins. The current treatment provides moderate improvement of lipids. There are no compliance problems.  Risk factors for coronary artery disease include diabetes mellitus, dyslipidemia, hypertension and male sex.  Hypertension This is a chronic problem. The current episode started more than 1 year ago. The problem has been gradually improving since onset. The problem is controlled. Associated symptoms include sweats. Pertinent negatives include no blurred vision or chest pain. There are no associated agents to hypertension. Risk factors for coronary artery disease include diabetes mellitus, dyslipidemia and male gender. Past treatments include angiotensin  blockers. The current treatment provides moderate improvement. There are no compliance problems.  Hypertensive end-organ damage includes kidney disease, heart failure and retinopathy. Identifiable causes of hypertension include chronic renal disease.    Review of systems  Constitutional: + Minimally fluctuating body weight,  current Body mass  index is 31 kg/m. , no fatigue, no subjective hyperthermia, no subjective hypothermia Eyes: no blurry vision, no xerophthalmia ENT: no sore throat, no nodules palpated in throat, no dysphagia/odynophagia, no hoarseness Cardiovascular: no chest pain, no shortness of breath, no palpitations, no leg swelling Respiratory: no cough, no shortness of breath Gastrointestinal: no nausea/vomiting/diarrhea Musculoskeletal: no muscle/joint aches Skin: no rashes, no hyperemia Neurological: no tremors, no numbness, no tingling, no dizziness Psychiatric: no depression, no anxiety  Objective:     BP 115/72 (BP Location: Left Arm, Patient Position: Sitting, Cuff Size: Large)   Pulse 73   Ht 5' 11.5" (1.816 m)   Wt 225 lb 6.4 oz (102.2 kg)   BMI 31.00 kg/m   Wt Readings from Last 3 Encounters:  01/27/22 225 lb 6.4 oz (102.2 kg)  10/20/21 220 lb (99.8 kg)  09/16/21 223 lb 12.8 oz (101.5 kg)     BP Readings from Last 3 Encounters:  01/27/22 115/72  10/20/21 121/73  09/16/21 120/60      Physical Exam- Limited  Constitutional:  Body mass index is 31 kg/m. , not in acute distress, normal state of mind Eyes:  EOMI, no exophthalmos Musculoskeletal: no gross deformities, strength intact in all four extremities, no gross restriction of joint movements Skin:  no rashes, no hyperemia Neurological: no tremor with outstretched hands    CMP ( most recent) CMP     Component Value Date/Time   NA 137 08/27/2021 1008   NA 141 04/15/2021 0826   K 4.0 08/27/2021 1008   CL 109 08/27/2021 1008   CO2 22 08/27/2021 1008   GLUCOSE 262 (H) 08/27/2021 1008   BUN  18 08/27/2021 1008   BUN 19 04/15/2021 0826   CREATININE 1.21 08/27/2021 1008   CREATININE 1.03 05/07/2020 0816   CALCIUM 8.9 08/27/2021 1008   PROT 7.2 04/15/2021 0826   ALBUMIN 4.2 04/15/2021 0826   AST 52 (H) 04/15/2021 0826   ALT 37 04/15/2021 0826   ALKPHOS 116 04/15/2021 0826   BILITOT 0.5 04/15/2021 0826   GFRNONAA >60 08/27/2021 1008   GFRNONAA 76 05/07/2020 0816   GFRAA 89 05/07/2020 0816     Diabetic Labs (most recent): Lab Results  Component Value Date   HGBA1C 7.5 (A) 01/27/2022   HGBA1C 8.1 (A) 10/20/2021   HGBA1C 7.4 (A) 04/22/2021   MICROALBUR 150 10/20/2021   MICROALBUR 7.9 05/07/2020   MICROALBUR 17.5 (H) 10/15/2017     Lipid Panel ( most recent) Lipid Panel     Component Value Date/Time   CHOL 154 04/15/2021 0826   TRIG 89 04/15/2021 0826   HDL 48 04/15/2021 0826   CHOLHDL 3.2 04/15/2021 0826   CHOLHDL 2.7 05/07/2020 0816   VLDL 33.0 10/15/2017 0818   LDLCALC 89 04/15/2021 0826   LDLCALC 65 05/07/2020 0816   LDLDIRECT 120.2 04/04/2013 0918   LABVLDL 17 04/15/2021 0826      Lab Results  Component Value Date   TSH 1.490 04/15/2021   TSH 1.10 10/15/2017   TSH 1.63 04/28/2016   TSH 0.99 05/21/2014   TSH 1.43 04/04/2013   TSH 1.28 11/13/2011   TSH 0.77 03/29/2010   TSH 0.95 09/01/2009   TSH 0.71 02/08/2009   TSH 0.75 02/07/2008   FREET4 1.32 04/15/2021           Assessment & Plan:   1) Controlled Type 2 Diabetes without complication  He presents today with his logs, no meter, showing stable, at target glycemic profile overall.  His POCT  A1c today is 7.5%, improving from last visit of 8.1%.  He did note some afternoon drops in glucose right after starting the Trulicity which have resolved.   - Aaron Fox has currently uncontrolled symptomatic type 2 DM since 66 years of age.  -Recent labs reviewed.  - I had a long discussion with him about the progressive nature of diabetes and the pathology behind its complications. -his  diabetes is complicated by retinopathy, CKD, neuropathy, recent TMA and he remains at a high risk for more acute and chronic complications which include CAD, CVA, CKD, retinopathy, and neuropathy. These are all discussed in detail with him.  - Nutritional counseling repeated at each appointment due to patients tendency to fall back in to old habits.  - The patient admits there is a room for improvement in their diet and drink choices. -  Suggestion is made for the patient to avoid simple carbohydrates from their diet including Cakes, Sweet Desserts / Pastries, Ice Cream, Soda (diet and regular), Sweet Tea, Candies, Chips, Cookies, Sweet Pastries, Store Bought Juices, Alcohol in Excess of 1-2 drinks a day, Artificial Sweeteners, Coffee Creamer, and "Sugar-free" Products. This will help patient to have stable blood glucose profile and potentially avoid unintended weight gain.   - I encouraged the patient to switch to unprocessed or minimally processed complex starch and increased protein intake (animal or plant source), fruits, and vegetables.   - Patient is advised to stick to a routine mealtimes to eat 3 meals a day and avoid unnecessary snacks (to snack only to correct hypoglycemia).  - I have approached him with the following individualized plan to manage  his diabetes and patient agrees:   -He is advised to lower his 70/30 insulin to 5 units twice daily with meals if glucose is above 90 and he is eating.  He is advised to increase his Trulicity to 1.5 mg SQ weekly, a more therapeutic dose.    -he is encouraged to continue monitoring blood glucose 3 times daily, before injecting insulin (at breakfast and supper) and before bed, and to call the clinic if he has readings less than 70 or greater than 300 for 3 tests in a row.   - he is warned not to take insulin without proper monitoring per orders. - Adjustment parameters are given to him for hypo and hyperglycemia in writing.  - Specific targets  for  A1c;  LDL, HDL,  and Triglycerides were discussed with the patient.  2) Blood Pressure /Hypertension:  his blood pressure is controlled to target.   he is advised to continue his current medications including Cozaar 50 mg p.o. daily with breakfast.  3) Lipids/Hyperlipidemia:    Review of his recent lipid panel from 04/15/21 showed controlled  LDL at 89 .  he  is advised to continue Lipitor 10 mg daily at bedtime.  Side effects and precautions discussed with him.    4)  Weight/Diet:  his Body mass index is 31 kg/m.  -   clearly complicating his diabetes care.   he is a candidate for weight loss. I discussed with him the fact that loss of 5 - 10% of his  current body weight will have the most impact on his diabetes management.  Exercise, and detailed carbohydrates information provided  -  detailed on discharge instructions.  5) Chronic Care/Health Maintenance: -he is on ACEI/ARB and Statin medications and is encouraged to initiate and continue to follow up with Ophthalmology, Dentist, Podiatrist at least yearly or  according to recommendations, and advised to stay away from smoking. I have recommended yearly flu vaccine and pneumonia vaccine at least every 5 years; moderate intensity exercise for up to 150 minutes weekly; and sleep for at least 7 hours a day.  - he is advised to maintain close follow up with [name], MD for primary care needs, as well as his other providers for optimal and coordinated care.    I spent 30 minutes in the care of the patient today including review of labs from [facility], Lipids, Thyroid Function, Hematology (current and previous including abstractions from other facilities); face-to-face time discussing  his blood glucose readings/logs, discussing hypoglycemia and hyperglycemia episodes and symptoms, medications doses, his options of short and long term treatment based on the latest standards of care / guidelines;  discussion about incorporating lifestyle  medicine;  and documenting the encounter. Risk reduction counseling performed per USPSTF guidelines to reduce obesity and cardiovascular risk factors.     Please refer to Patient Instructions for Blood Glucose Monitoring and Insulin/Medications Dosing Guide"  in media tab for additional information. Please  also refer to " Patient Self Inventory" in the Media  tab for reviewed elements of pertinent patient history.  [place] participated in the discussions, expressed understanding, and voiced agreement with the above plans.  All questions were answered to his satisfaction. he is encouraged to contact clinic should he have any questions or concerns prior to his return visit.   Follow up plan: - Return in about 4 months (around 05/29/2022) for Diabetes F/U with A1c in office, No previsit labs, Bring meter and logs.  [name], [facility] [address] [place], [place] [address] Phone: [phone] Fax: [phone]  01/27/2022, 8:41 AM

## 2022-01-30 ENCOUNTER — Ambulatory Visit (INDEPENDENT_AMBULATORY_CARE_PROVIDER_SITE_OTHER): Payer: Medicare HMO | Admitting: Family Medicine

## 2022-01-30 ENCOUNTER — Encounter: Payer: Self-pay | Admitting: Family Medicine

## 2022-01-30 VITALS — BP 128/72 | HR 76 | Temp 98.8°F | Ht 71.0 in | Wt 225.0 lb

## 2022-01-30 DIAGNOSIS — J069 Acute upper respiratory infection, unspecified: Secondary | ICD-10-CM | POA: Diagnosis not present

## 2022-01-30 MED ORDER — AZITHROMYCIN 250 MG PO TABS: ORAL_TABLET | ORAL | 0 refills | Status: DC

## 2022-01-30 MED ORDER — HYDROCODONE BIT-HOMATROP MBR 5-1.5 MG/5ML PO SOLN: 5.0000 mL | Freq: Three times a day (TID) | ORAL | 0 refills | Status: DC | PRN

## 2022-01-30 NOTE — Progress Notes (Signed)
Subjective:    Patient ID: Aaron Fox, male    DOB: 1956-01-16, 66 y.o.   MRN: [id number]  Cough Patient says his symptoms began about 9 days ago.  Symptoms include cough productive of yellow and brown sputum, chest congestion, rhinorrhea, postnasal drip.  He denies any shortness of breath.  He denies any sinus pain.  He denies any fevers or chills.  He has been taking Mucinex Past Medical History:  Diagnosis Date  . ALLERGIC RHINITIS 07/02/2008  . Allergy   . Arthritis    RA  . Atrial fibrillation ([place])   . Cataract    removed both eyes   . CHF 06/19/2008  . COUGH DUE TO ACE INHIBITORS 10/05/2008  . DIABETES MELLITUS, TYPE II 09/22/2006  . Diabetic retinopathy of both eyes ([facility])    mild nonproliferative  . ED (erectile dysfunction)   . Glaucoma   . GOUT 09/22/2006  . Hx of adenomatous colonic polyps 06/09/2015  . HYPERCHOLESTEROLEMIA 07/02/2008  . HYPERTENSION 05/31/2009  . HYPOGONADISM, MALE 01/15/2007  . Hypokalemia   . 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 Lymphadenopathy   . Peripheral neuropathy   . PUD (peptic ulcer disease)   . Rheumatoid arthritis(714.0) 09/22/2006   Past Surgical History:  Procedure Laterality Date  . ACHILLES TENDON SURGERY Right 04/25/2019   Procedure: ACHILLES LENGTHENING;  Surgeon: Trula Slade, DPM;  Location: [facility];  Service: Podiatry;  Laterality: Right;  . AMPUTATION Right 04/25/2019   Procedure: AMPUTATION Brynda Peon;  Surgeon: Trula Slade, DPM;  Location: [facility];  Service: Podiatry;  Laterality: Right;  . CATARACT EXTRACTION Bilateral    Dr. Talbert Forest  . CATARACT EXTRACTION, BILATERAL    . COLONOSCOPY  2004  . LYMPH NODE BIOPSY Right 10/16/2014   Procedure: RIGHT INGUINAL LYMPH NODE BIOPSY;  Surgeon: Aviva Signs, MD;  Location: [facility];  Service: General;  Laterality: Right;   Current Outpatient Medications on File Prior to Visit  Medication Sig Dispense Refill  . ACCU-CHEK GUIDE test strip TEST BLOOD SUGAR 7  TIMES DAILY 600 strip 3  . Accu-Chek Softclix Lancets lancets TEST  7  TIMES  DAILY 600 each 3  . Alcohol Swabs (B-D SINGLE USE SWABS REGULAR) PADS Use to cleanse site prior to use of lancet to obtain droplet of blood two times per day; E10.49 200 each 2  . aspirin 81 MG tablet Take 81 mg by mouth daily.    Marland Kitchen atorvastatin (LIPITOR) 10 MG tablet TAKE 1 TABLET EVERY DAY 90 tablet 1  . Blood Glucose Monitoring Suppl (ACCU-CHEK GUIDE ME) w/Device KIT 1 each by Does not apply route See admin instructions. Use to monitor glucose levels 7 times daily; E11.9; WILL NOT COMPLETE PA 1 kit 0  . Dulaglutide (TRULICITY) 1.5 XY/8.0XK SOPN Inject 1.5 mg into the skin once a week. 6 mL 1  . gabapentin (NEURONTIN) 300 MG capsule TAKE 2 CAPSULES THREE TIMES DAILY 540 capsule 2  . insulin isophane & regular human KwikPen (NOVOLIN 70/30 KWIKPEN) (70-30) 100 UNIT/ML KwikPen Inject 5 Units into the skin 2 (two) times daily before a meal. 15 mL 1  . losartan (COZAAR) 50 MG tablet TAKE 1 TABLET EVERY DAY 90 tablet 10  . meloxicam (MOBIC) 15 MG tablet Take 1 tablet (15 mg total) by mouth daily. 30 tablet 0  . mupirocin ointment (BACTROBAN) 2 % Apply 1 application topically 2 (two) times daily. 30 g 2  . Tetrahydrozoline HCl (VISINE OP) Place 1 drop into both  eyes daily as needed (redness).    . triamcinolone cream (KENALOG) 0.1 % APPLY TO THE AFFECTED AREA(S) TOPICALLY THREE TIMES DAILY AS NEEDED FOR RASH 45 g 2   No current facility-administered medications on file prior to visit.   Allergies  Allergen Reactions  . Penicillins Swelling    Swelling of the hands and feet, skin peeling Did it involve swelling of the face/tongue/throat, SOB, or low BP? No Did it involve sudden or severe rash/hives, skin peeling, or any reaction on the inside of your mouth or nose? No Did you need to seek medical attention at a hospital or doctor's office? Yes When did it last happen?      10 + years If all above answers are "NO", may  proceed with cephalosporin use.   . Ciprofloxacin Hives and Rash  . Terbinafine Hcl Hives  . Zestril [Lisinopril] Cough   Social History   Socioeconomic History  . Marital status: Married    Spouse name: Not on file  . Number of children: 3  . Years of education: Not on file  . Highest education level: Not on file  Occupational History  . Occupation: Disability    Employer: [employer]  Tobacco Use  . Smoking status: Former    Types: Cigarettes    Quit date: 11/02/1999    Years since quitting: 22.2  . Smokeless tobacco: Never  Vaping Use  . Vaping Use: Never used  Substance and Sexual Activity  . Alcohol use: No    Alcohol/week: 0.0 standard drinks of alcohol  . Drug use: No  . Sexual activity: Not on file  Other Topics Concern  . Not on file  Social History Narrative   ** Merged History Encounter **       Married, 3 kids Prior work [employer] before disability Regular exercise-yes   Social Determinants of Health   Financial Resource Strain: Low Risk  (01/27/2022)   Overall Financial Resource Strain (CARDIA)   . Difficulty of Paying Living Expenses: Not hard at all  Food Insecurity: No Food Insecurity (01/27/2022)   Hunger Vital Sign   . Worried About Charity fundraiser in the Last Year: Never true   . Ran Out of Food in the Last Year: Never true  Transportation Needs: No Transportation Needs (01/27/2022)   PRAPARE - Transportation   . Lack of Transportation (Medical): No   . Lack of Transportation (Non-Medical): No  Physical Activity: Sufficiently Active (01/27/2022)   Exercise Vital Sign   . Days of Exercise per Week: 7 days   . Minutes of Exercise per Session: 30 min  Stress: No Stress Concern Present (08/27/2020)   [place]   . Feeling of Stress : Not at all  Social Connections: Moderately Integrated (08/27/2020)   Social Connection and Isolation Panel [NHANES]   . Frequency of Communication with  Friends and Family: More than three times a week   . Frequency of Social Gatherings with Friends and Family: Three times a week   . Attends Religious Services: More than 4 times per year   . Active Member of Clubs or Organizations: No   . Attends Archivist Meetings: Never   . Marital Status: Married  Human resources officer Violence: Not At Risk (01/27/2022)   Humiliation, Afraid, Rape, and Kick questionnaire   . Fear of Current or Ex-Partner: No   . Emotionally Abused: No   . Physically Abused: No   . Sexually  Abused: No     Review of Systems  Respiratory:  Positive for cough.   All other systems reviewed and are negative.      Objective:   Physical Exam Vitals reviewed.  Constitutional:      General: He is not in acute distress.    Appearance: Normal appearance. He is normal weight. He is not ill-appearing, toxic-appearing or diaphoretic.  HENT:     Right Ear: Tympanic membrane and ear canal normal.     Left Ear: Tympanic membrane and ear canal normal.     Nose: Congestion and rhinorrhea present.     Mouth/Throat:     Pharynx: Posterior oropharyngeal erythema present.  Cardiovascular:     Rate and Rhythm: Normal rate and regular rhythm.     Heart sounds: Normal heart sounds. No murmur heard. Pulmonary:     Effort: Pulmonary effort is normal. No respiratory distress.     Breath sounds: Normal breath sounds. No stridor. No wheezing, rhonchi or rales.  Chest:     Chest wall: No deformity, swelling, tenderness or crepitus.    Abdominal:     General: Abdomen is flat. Bowel sounds are normal.     Palpations: Abdomen is soft.  Lymphadenopathy:     Cervical: No cervical adenopathy.          Assessment & Plan:  URI, acute I explained to the patient that I feel he most likely has a viral upper respiratory infection.  I recommended tincture of time.  I will treat his cough symptomatically with Hycodan 1 teaspoon every 6 hours as needed.  Anticipate self-limited  resolution over the next 3 to 4 days.  If he develops high fever, worsening chest congestion, chest pain or pleurisy, I did give him a Z-Pak for possible bronchitis but at the present time I do not think his symptoms warrant the antibiotic.  Patient understands and will not fill the antibiotic unless symptoms worsen

## 2022-02-02 IMAGING — CR DG CHEST 2V
2 series · 2 of 2 positions shown · non-contrast
Comparison: 07/24/2013

CLINICAL DATA: Right posterior axillary/posterior rib area pain for
the past 2 weeks following a fall. Ex-smoker.

EXAM:
CHEST - 2 VIEW

[w chest pa]
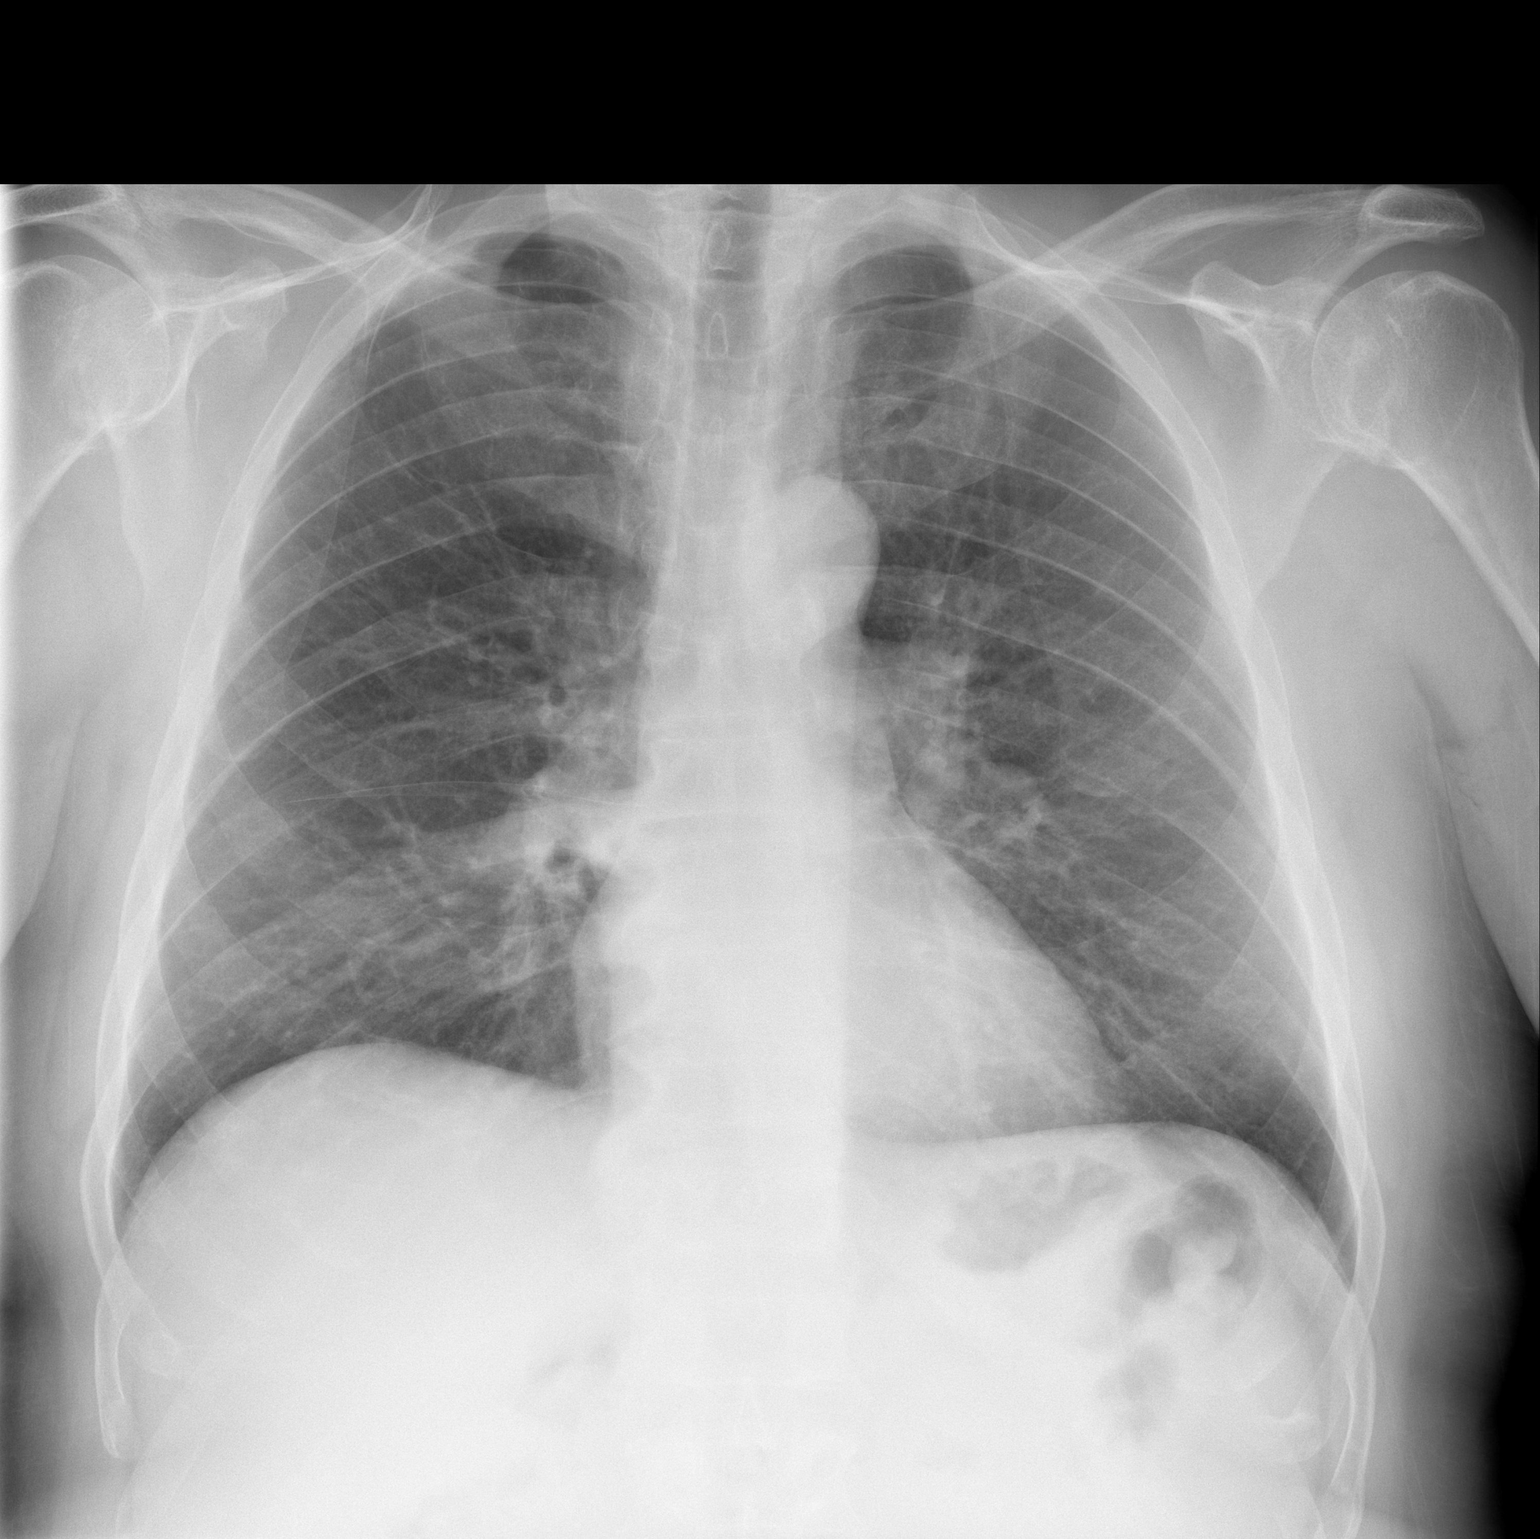

[w chest lat]
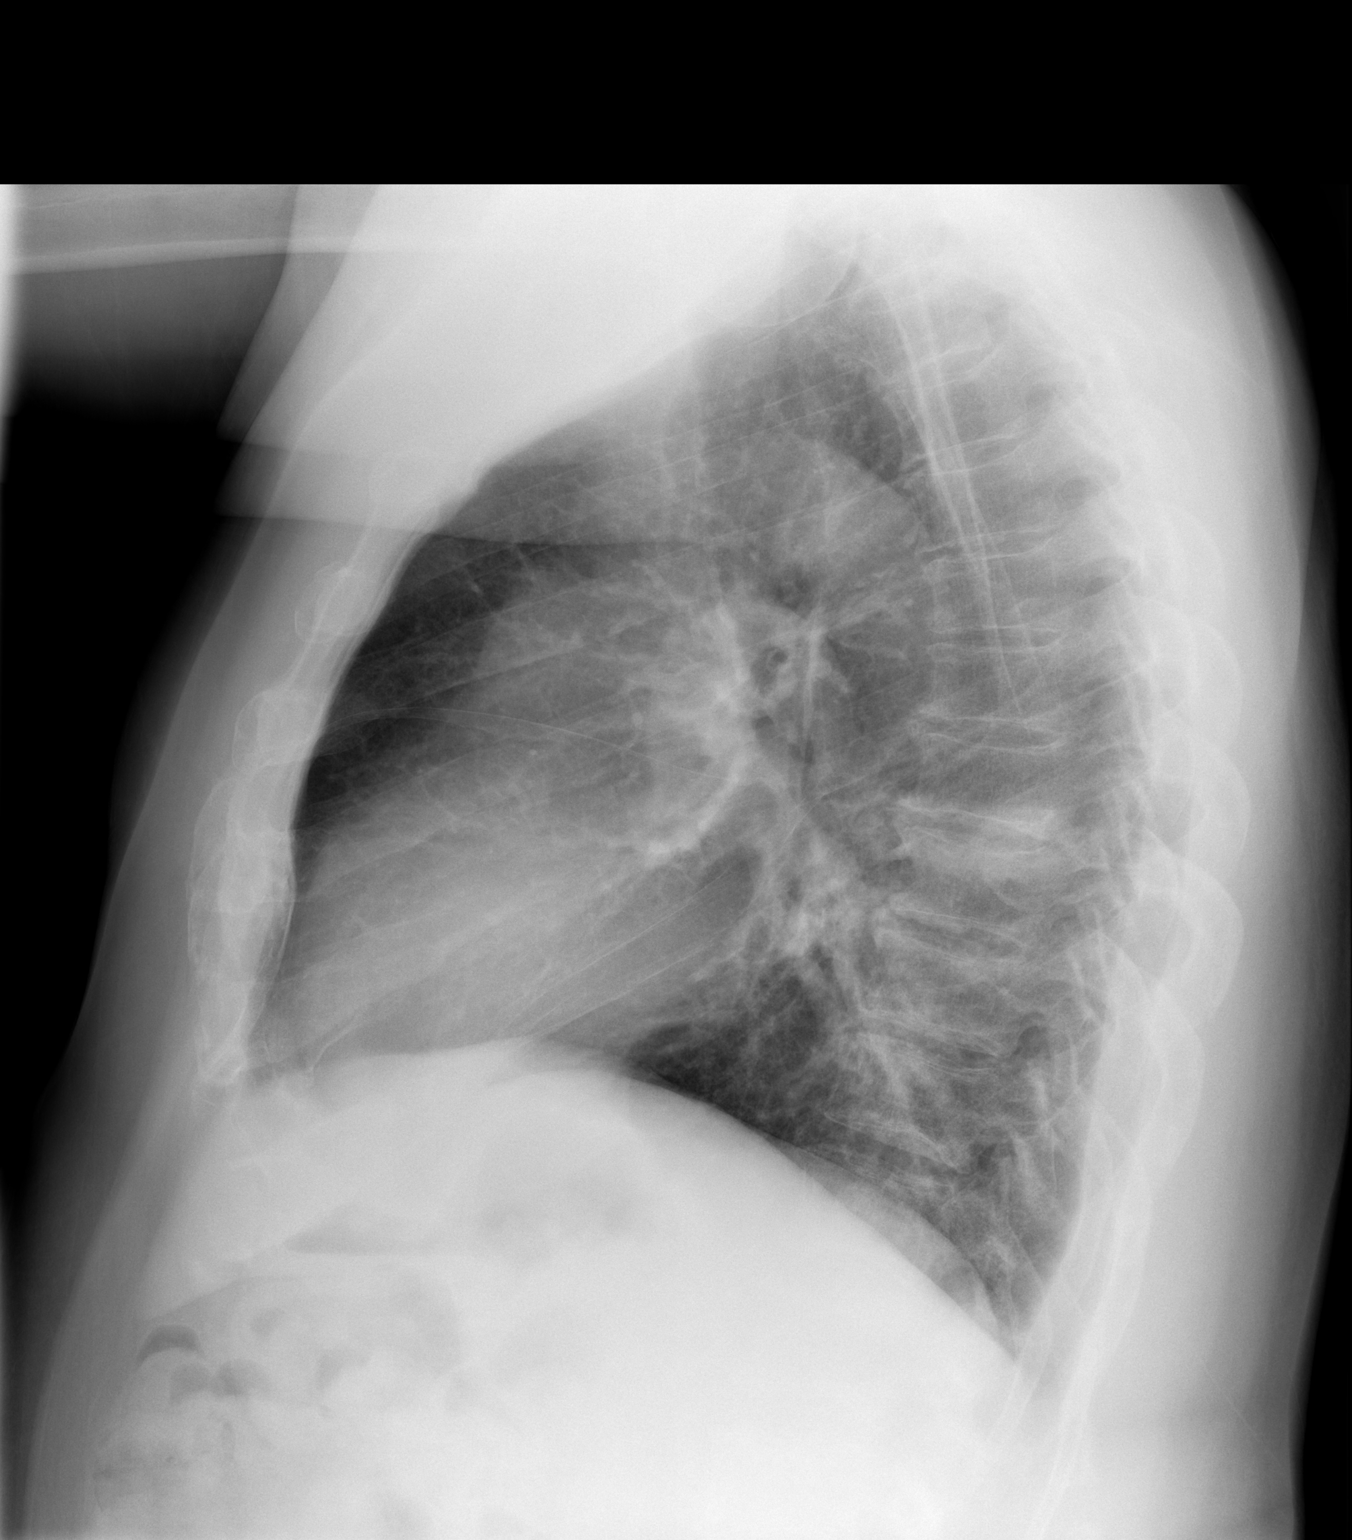

[2 of 2 positions shown; findings below may reference images not displayed]

FINDINGS: Normal sized heart. Clear lungs. Stable minimal peribronchial
thickening. No fracture or pneumothorax seen. Thoracic spine
degenerative changes.
IMPRESSION: 1. No acute abnormality.
2. Stable minimal chronic bronchitic changes, compatible with the
history of smoking.

## 2022-02-07 ENCOUNTER — Ambulatory Visit (INDEPENDENT_AMBULATORY_CARE_PROVIDER_SITE_OTHER): Payer: Medicare HMO

## 2022-02-07 VITALS — Ht 71.0 in | Wt 225.0 lb

## 2022-02-07 DIAGNOSIS — Z0000 Encounter for general adult medical examination without abnormal findings: Secondary | ICD-10-CM

## 2022-02-07 NOTE — Progress Notes (Signed)
Subjective:   Aaron Fox is a 66 y.o. male who presents for Medicare Annual/Subsequent preventive examination.  I connected with  [facility] on 02/07/22 by a audio enabled telemedicine application and verified that I am speaking with the correct person using two identifiers.  Patient Location: Home  Provider Location: Office/Clinic  I discussed the limitations of evaluation and management by telemedicine. The patient expressed understanding and agreed to proceed.  Review of Systems     Cardiac Risk Factors include: advanced age (>14mn, >>22women);diabetes mellitus;dyslipidemia;male gender;hypertension     Objective:    Today's Vitals   02/07/22 1014  Weight: 225 lb (102.1 kg)  Height: _0  (1.803 m)   Body mass index is 31.38 kg/m.     02/07/2022   10:18 AM 08/27/2021   10:05 AM 08/27/2020    8:27 AM 04/25/2019    6:45 PM 04/25/2019   11:30 AM 04/24/2019    2:41 PM 02/24/2016    9:55 AM  Advanced Directives  Does Patient Have a Medical Advance Directive? _1  No No  Would patient like information on creating a medical advance directive? Yes (MAU/Ambulatory/Procedural Areas - Information given) No - Patient declined No - Patient declined No - Patient declined No - Patient declined No - Patient declined No - Patient declined    Current Medications (verified) Outpatient Encounter Medications as of 02/07/2022  Medication Sig   ACCU-CHEK GUIDE test strip TEST BLOOD SUGAR 7 TIMES DAILY   Accu-Chek Softclix Lancets lancets TEST  7  TIMES  DAILY   Alcohol Swabs (B-D SINGLE USE SWABS REGULAR) PADS Use to cleanse site prior to use of lancet to obtain droplet of blood two times per day; E10.49   aspirin 81 MG tablet Take 81 mg by mouth daily.   atorvastatin (LIPITOR) 10 MG tablet TAKE 1 TABLET EVERY DAY   Blood Glucose Monitoring Suppl (ACCU-CHEK GUIDE ME) w/Device KIT 1 each by Does not apply route See admin instructions. Use to monitor glucose levels  7 times daily; E11.9; WILL NOT COMPLETE PA   Dulaglutide (TRULICITY) 1.5 MFV/4.9SWSOPN Inject 1.5 mg into the skin once a week.   gabapentin (NEURONTIN) 300 MG capsule TAKE 2 CAPSULES THREE TIMES DAILY   HYDROcodone bit-homatropine (HYCODAN) 5-1.5 MG/5ML syrup Take 5 mLs by mouth every 8 (eight) hours as needed for cough.   insulin isophane & regular human KwikPen (NOVOLIN 70/30 KWIKPEN) (70-30) 100 UNIT/ML KwikPen Inject 5 Units into the skin 2 (two) times daily before a meal.   losartan (COZAAR) 50 MG tablet TAKE 1 TABLET EVERY DAY   meloxicam (MOBIC) 15 MG tablet Take 1 tablet (15 mg total) by mouth daily.   mupirocin ointment (BACTROBAN) 2 % Apply 1 application topically 2 (two) times daily.   Tetrahydrozoline HCl (VISINE OP) Place 1 drop into both eyes daily as needed (redness).   triamcinolone cream (KENALOG) 0.1 % APPLY TO THE AFFECTED AREA(S) TOPICALLY THREE TIMES DAILY AS NEEDED FOR RASH   [DISCONTINUED] azithromycin (ZITHROMAX) 250 MG tablet 2 tabs poqday1, 1 tab poqday 2-5   No facility-administered encounter medications on file as of 02/07/2022.    Allergies (verified) Penicillins, Ciprofloxacin, Terbinafine hcl, and Zestril [lisinopril]   History: Past Medical History:  Diagnosis Date   ALLERGIC RHINITIS 07/02/2008   Allergy    Arthritis    RA   Atrial fibrillation (HCC)    Cataract    removed both eyes    CHF 06/19/2008  COUGH DUE TO ACE INHIBITORS 10/05/2008   DIABETES MELLITUS, TYPE II 09/22/2006   Diabetic retinopathy of both eyes ([place])    mild nonproliferative   ED (erectile dysfunction)    Glaucoma    GOUT 09/22/2006   Hx of adenomatous colonic polyps 06/09/2015   HYPERCHOLESTEROLEMIA 07/02/2008   HYPERTENSION 05/31/2009   HYPOGONADISM, MALE 01/15/2007   Hypokalemia    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 Talbert Forest   CATARACT EXTRACTION, BILATERAL     COLONOSCOPY  2004   LYMPH NODE BIOPSY Right 10/16/2014   Procedure: RIGHT INGUINAL LYMPH NODE BIOPSY;  Surgeon: Aviva Signs, MD;  Location: [facility];  Service: General;  Laterality: Right;   Family History  Problem Relation Age of Onset   Heart disease Other        FH of CAD   Colon polyps Paternal Uncle    Kidney disease Paternal Uncle    Diabetes Paternal Grandfather    Diabetes Maternal Uncle    Cancer Neg Hx    Colon cancer Neg Hx    Gallbladder disease Neg Hx    Esophageal cancer Neg Hx    Rectal cancer Neg Hx    Stomach cancer Neg Hx    Social History   Socioeconomic History   Marital status: Married    Spouse name: Not on file   Number of children: 3   Years of education: Not on file   Highest education level: Not on file  Occupational History   Occupation: Disability    Employer: [employer]  Tobacco Use   Smoking status: Former    Types: Cigarettes    Quit date: 11/02/1999    Years since quitting: 22.2   Smokeless tobacco: Never  Vaping Use   Vaping Use: Never used  Substance and Sexual Activity   Alcohol use: No    Alcohol/week: 0.0 standard drinks of alcohol   Drug use: No   Sexual activity: Not on file  Other Topics Concern   Not on file  Social History Narrative   ** Merged History Encounter ** Married, 3 kids   Prior work [employer] before disability   Regular exercise-yes   Works some doing Film/video editor Determinants of Radio broadcast assistant Strain: Low Risk  (02/07/2022)   Overall Financial Resource  Strain (CARDIA)    Difficulty of Paying Living Expenses: Not hard at all  Food Insecurity: No Food Insecurity (02/07/2022)   Hunger Vital Sign    Worried About Running Out of Food in the Last Year: Never true    Ran Out of Food in the Last Year: Never true  Transportation Needs: No Transportation Needs (02/07/2022)   PRAPARE - Hydrologist (Medical): No    Lack of Transportation (Non-Medical): No  Physical Activity: Sufficiently Active (02/07/2022)   Exercise Vital Sign    Days of Exercise per Week: 7 days    Minutes of Exercise  per Session: 60 min  Stress: No Stress Concern Present (02/07/2022)   [place]    Feeling of Stress : Not at all  Social Connections: Moderately Integrated (08/27/2020)   Social Connection and Isolation Panel [NHANES]    Frequency of Communication with Friends and Family: More than three times a week    Frequency of Social Gatherings with Friends and Family: Three times a week    Attends Religious Services: More than 4 times per year    Active Member of Clubs or Organizations: No    Attends Archivist Meetings: Never    Marital Status: Married    Tobacco Counseling Counseling given: Not Answered   Clinical Intake:  Pre-visit preparation completed: Yes  Pain : No/denies pain  BMI - recorded: 31.38 Nutritional Status: BMI > 30  Obese Nutritional Risks: None Diabetes: Yes CBG done?: No Did pt. bring in CBG monitor from home?: No  How often do you need to have someone help you when you read instructions, pamphlets, or other written materials from your doctor or pharmacy?: 1 - Never  Diabetic?Yes Nutrition Risk Assessment:  Has the patient had any N/V/D within the last 2 months?  No  Does the patient have any non-healing wounds?  No  Has the patient had any unintentional weight loss or weight gain?  No   Diabetes:  Is the patient diabetic?   Yes  If diabetic, was a CBG obtained today?  No  Did the patient bring in their glucometer from home?  No  How often do you monitor your CBG's? Twice daily   Financial Strains and Diabetes Management:  Are you having any financial strains with the device, your supplies or your medication? No .  Does the patient want to be seen by Chronic Care Management for management of their diabetes?  No  Would the patient like to be referred to a Nutritionist or for Diabetic Management?  No   Diabetic Exams:  Diabetic Eye Exam: Completed 08/05/21 Diabetic Foot Exam: Completed with podiatry   Interpreter Needed?: No  Information entered by :: Denman George LPN   Activities of Daily Living    02/07/2022   10:18 AM  In your present state of health, do you have any difficulty performing the following activities:  Hearing? 0  Vision? 0  Difficulty concentrating or making decisions? 0  Walking or climbing stairs? 0  Dressing or bathing? 0  Doing errands, shopping? 0  Preparing Food and eating ? N  Using the Toilet? N  In the past six months, have you accidently leaked urine? N  Do you have problems with loss of bowel control? N  Managing your Medications? N  Managing your Finances? N  Housekeeping or managing your Housekeeping? N    Patient Care Team: [name], MD as PCP - General (Family Medicine) Edythe Clarity, [facility] as Pharmacist (Pharmacist)  Indicate any recent Medical Services you may have received from other than [facility] providers in the past year (date may be approximate).     Assessment:   This is a routine wellness examination for Az[place]z.  Hearing/Vision screen Hearing Screening - Comments:: No concerns noted  Vision Screening - Comments:: Up to date with routine eye exams   Dietary issues and exercise activities discussed: Current Exercise Habits: The patient has a physically strenuous job, but has no regular exercise apart from work.   Goals Addressed  This Visit's Progress    Monitor and Manage My Blood Sugar-Diabetes Type II   On track    Timeframe:  Long-Range Goal Priority:  High Start Date:   06/11/20                          Expected End Date:  12/11/20                     Follow Up Date 12/27/20   - check blood sugar at prescribed times - check blood sugar if I feel it is too high or too low - enter blood sugar readings and medication or insulin into daily log - take the blood sugar log to all doctor visits    Why is this important?   Checking your blood sugar at home helps to keep it from getting very high or very low.  Writing the results in a diary or log helps the doctor know how to care for you.  Your blood sugar log should have the time, the date and the results.  Also, write down the amount of insulin or other medicine you take.  Other information like what you ate, exercise done and how you were feeling will also be helpful..     Notes: Goal fasting 80-130  10/07/20 - A1c improved to 7.0!!      Depression Screen    02/07/2022   10:17 AM 01/27/2022    1:29 PM 01/27/2022    1:27 PM 08/27/2020    8:29 AM 05/07/2020    7:58 AM  PHQ 2/9 Scores  PHQ - 2 Score 0 0 0 0 0    Fall Risk    02/07/2022   10:15 AM 08/27/2020    8:29 AM 05/07/2020    7:58 AM  Fall Risk   Falls in the past year? 1 0 0  Number falls in past yr: 0 0   Injury with Fall? 0 0   Risk for fall due to : History of fall(s) No Fall Risks No Fall Risks  Follow up Falls evaluation completed;Education provided;Falls prevention discussed Falls evaluation completed;Falls prevention discussed Falls evaluation completed    FALL RISK PREVENTION PERTAINING TO THE HOME:  Any stairs in or around the home? Yes  If so, are there any without handrails? No  Home free of loose throw rugs in walkways, pet beds, electrical cords, etc? Yes  Adequate lighting in your home to reduce risk of falls? Yes   ASSISTIVE DEVICES UTILIZED TO PREVENT  FALLS:  Life alert? No  Use of a cane, walker or w/c? No  Grab bars in the bathroom? No  Shower chair or bench in shower? No  Elevated toilet seat or a handicapped toilet? No   TIMED UP AND GO:  Was the test performed? No .  Length of time to ambulate 10 feet: telephonic visit    Cognitive Function:        02/07/2022   10:18 AM  6CIT Screen  What Year? 0 points  What month? 0 points  What time? 0 points  Count back from 20 0 points  Months in reverse 0 points  Repeat phrase 0 points  Total Score 0 points    Immunizations Immunization History  Administered Date(s) Administered   Influenza Whole 12/28/2008   Influenza,inj,Quad PF,6+ Mos 11/21/2013, 04/01/2015, 12/20/2015, 01/08/2017, 12/17/2017, 10/25/2018, 12/19/2019, 03/28/2021   Moderna Sars-Covid-2 Vaccination 04/29/2019, 05/27/2019, 12/29/2019   Pneumococcal Polysaccharide-23 06/11/2008  Td 09/29/2005   Tdap 10/15/2017   Zoster Recombinat (Shingrix) 06/14/2020, 08/25/2020    TDAP status: Up to date  Flu Vaccine status: Due, Education has been provided regarding the importance of this vaccine. Advised may receive this vaccine at local pharmacy or Health Dept. Aware to provide a copy of the vaccination record if obtained from local pharmacy or Health Dept. Verbalized acceptance and understanding.  Pneumococcal vaccine status: Due, Education has been provided regarding the importance of this vaccine. Advised may receive this vaccine at local pharmacy or Health Dept. Aware to provide a copy of the vaccination record if obtained from local pharmacy or Health Dept. Verbalized acceptance and understanding.  Covid-19 vaccine status: Information provided on how to obtain vaccines.   Qualifies for Shingles Vaccine? Yes   Zostavax completed No   Shingrix Completed?: Yes  Screening Tests Health Maintenance  Topic Date Due   COVID-19 Vaccine (4 - 2023-24 season) 02/15/2022 (Originally 10/28/2021)   Diabetic kidney  evaluation - Urine ACR  02/24/2022 (Originally 10/16/2018)   INFLUENZA VACCINE  05/28/2022 (Originally 09/27/2021)   Pneumonia Vaccine 70+ Years old (2 - PCV) 01/31/2023 (Originally 04/07/2021)   HEMOGLOBIN A1C  07/29/2022   OPHTHALMOLOGY EXAM  08/06/2022   Diabetic kidney evaluation - eGFR measurement  08/28/2022   Medicare Annual Wellness (AWV)  02/08/2023   COLONOSCOPY (Pts 45-39yr Insurance coverage will need to be confirmed)  11/13/2023   DTaP/Tdap/Td (3 - Td or Tdap) 10/16/2027   Hepatitis C Screening  Completed   HIV Screening  Completed   Zoster Vaccines- Shingrix  Completed   HPV VACCINES  Aged Out   FOOT EXAM  Discontinued    Health Maintenance  There are no preventive care reminders to display for this patient.  Colorectal cancer screening: Type of screening: Colonoscopy. Completed 11/13/18. Repeat every 5 years  Lung Cancer Screening: (Low Dose CT Chest recommended if Age 65-80years, 30 pack-year currently smoking OR have quit w/in 15years.) does not qualify.   Lung Cancer Screening Referral: n/a  Additional Screening:  Hepatitis C Screening: does qualify; Completed 05/11/20  Vision Screening: Recommended annual ophthalmology exams for early detection of glaucoma and other disorders of the eye. Is the patient up to date with their annual eye exam?  Yes  Who is the provider or what is the name of the office in which the patient attends annual eye exams? Unable to provide name  If pt is not established with a provider, would they like to be referred to a provider to establish care? No .   Dental Screening: Recommended annual dental exams for proper oral hygiene  Community Resource Referral / Chronic Care Management: CRR required this visit?  No   CCM required this visit?  No      Plan:     I have personally reviewed and noted the following in the patient's chart:   Medical and social history Use of alcohol, tobacco or illicit drugs  Current medications and  supplements including opioid prescriptions. Patient is not currently taking opioid prescriptions. Functional ability and status Nutritional status Physical activity Advanced directives List of other physicians Hospitalizations, surgeries, and ER visits in previous 12 months Vitals Screenings to include cognitive, depression, and falls Referrals and appointments  In addition, I have reviewed and discussed with patient certain preventive protocols, quality metrics, and best practice recommendations. A written personalized care plan for preventive services as well as general preventive health recommendations were provided to patient.     SVanetta Mulders  LPN   [id number]   Due to this being a virtual visit, the after visit summary with patients personalized plan was offered to patient via mail or my-chart.  Per request, patient was mailed a copy of AVS  Nurse Notes: No concerns

## 2022-02-07 NOTE — Patient Instructions (Signed)
Aaron Fox , Thank you for taking time to come for your Medicare Wellness Visit. I appreciate your ongoing commitment to your health goals. Please review the following plan we discussed and let me know if I can assist you in the future.   These are the goals we discussed:  Goals      Exercise 150 min/wk Moderate Activity     HEMOGLOBIN A1C < 7     Monitor and Manage My Blood Sugar-Diabetes Type II     Timeframe:  Long-Range Goal Priority:  High Start Date:   06/11/20                          Expected End Date:  12/11/20                     Follow Up Date 12/27/20   - check blood sugar at prescribed times - check blood sugar if I feel it is too high or too low - enter blood sugar readings and medication or insulin into daily log - take the blood sugar log to all doctor visits    Why is this important?   Checking your blood sugar at home helps to keep it from getting very high or very low.  Writing the results in a diary or log helps the doctor know how to care for you.  Your blood sugar log should have the time, the date and the results.  Also, write down the amount of insulin or other medicine you take.  Other information like what you ate, exercise done and how you were feeling will also be helpful..     Notes: Goal fasting 80-130  10/07/20 - A1c improved to 7.0!!     Weight (lb) < 200 lb (90.7 kg)        This is a list of the screening recommended for you and due dates:  Health Maintenance  Topic Date Due   COVID-19 Vaccine (4 - 2023-24 season) 02/15/2022*   Yearly kidney health urinalysis for diabetes  02/24/2022*   Flu Shot  05/28/2022*   Pneumonia Vaccine (2 - PCV) 01/31/2023*   Hemoglobin A1C  07/29/2022   Eye exam for diabetics  08/06/2022   Yearly kidney function blood test for diabetes  08/28/2022   Medicare Annual Wellness Visit  02/08/2023   Colon Cancer Screening  11/13/2023   DTaP/Tdap/Td vaccine (3 - Td or Tdap) 10/16/2027   Hepatitis C Screening:  USPSTF Recommendation to screen - Ages 16-79 yo.  Completed   HIV Screening  Completed   Zoster (Shingles) Vaccine  Completed   HPV Vaccine  Aged Out   Complete foot exam   Discontinued  *Topic was postponed. The date shown is not the original due date.    Advanced directives: Advance directive discussed with you today. I have provided a copy for you to complete at home and have notarized. Once this is complete please bring a copy in to our office so we can scan it into your chart.   Conditions/risks identified: Aim for 30 minutes of exercise or brisk walking, 6-8 glasses of water, and 5 servings of fruits and vegetables each day.   Next appointment: Follow up in one year for your annual wellness visit.   Preventive Care 65 Years and Older, Male  Preventive care refers to lifestyle choices and visits with your health care provider that can promote health and wellness. What does preventive care include? A  yearly physical exam. This is also called an annual well check. Dental exams once or twice a year. Routine eye exams. Ask your health care provider how often you should have your eyes checked. Personal lifestyle choices, including: Daily care of your teeth and gums. Regular physical activity. Eating a healthy diet. Avoiding tobacco and drug use. Limiting alcohol use. Practicing safe sex. Taking low doses of aspirin every day. Taking vitamin and mineral supplements as recommended by your health care provider. What happens during an annual well check? The services and screenings done by your health care provider during your annual well check will depend on your age, overall health, lifestyle risk factors, and family history of disease. Counseling  Your health care provider may ask you questions about your: Alcohol use. Tobacco use. Drug use. Emotional well-being. Home and relationship well-being. Sexual activity. Eating habits. History of falls. Memory and ability to  understand (cognition). Work and work Statistician. Screening  You may have the following tests or measurements: Height, weight, and BMI. Blood pressure. Lipid and cholesterol levels. These may be checked every 5 years, or more frequently if you are over 28 years old. Skin check. Lung cancer screening. You may have this screening every year starting at age 58 if you have a 30-pack-year history of smoking and currently smoke or have quit within the past 15 years. Fecal occult blood test (FOBT) of the stool. You may have this test every year starting at age 33. Flexible sigmoidoscopy or colonoscopy. You may have a sigmoidoscopy every 5 years or a colonoscopy every 10 years starting at age 67. Prostate cancer screening. Recommendations will vary depending on your family history and other risks. Hepatitis C blood test. Hepatitis B blood test. Sexually transmitted disease (STD) testing. Diabetes screening. This is done by checking your blood sugar (glucose) after you have not eaten for a while (fasting). You may have this done every 1-3 years. Abdominal aortic aneurysm (AAA) screening. You may need this if you are a current or former smoker. Osteoporosis. You may be screened starting at age 59 if you are at high risk. Talk with your health care provider about your test results, treatment options, and if necessary, the need for more tests. Vaccines  Your health care provider may recommend certain vaccines, such as: Influenza vaccine. This is recommended every year. Tetanus, diphtheria, and acellular pertussis (Tdap, Td) vaccine. You may need a Td booster every 10 years. Zoster vaccine. You may need this after age 53. Pneumococcal 13-valent conjugate (PCV13) vaccine. One dose is recommended after age 59. Pneumococcal polysaccharide (PPSV23) vaccine. One dose is recommended after age 39. Talk to your health care provider about which screenings and vaccines you need and how often you need them. This  information is not intended to replace advice given to you by your health care provider. Make sure you discuss any questions you have with your health care provider. Document Released: 03/12/2015 Document Revised: 11/03/2015 Document Reviewed: 12/15/2014 Elsevier Interactive Patient Education  2017 [place] Prevention in the Home Falls can cause injuries. They can happen to people of all ages. There are many things you can do to make your home safe and to help prevent falls. What can I do on the outside of my home? Regularly fix the edges of walkways and driveways and fix any cracks. Remove anything that might make you trip as you walk through a door, such as a raised step or threshold. Trim any bushes or trees on the path  to your home. Use bright outdoor lighting. Clear any walking paths of anything that might make someone trip, such as rocks or tools. Regularly check to see if handrails are loose or broken. Make sure that both sides of any steps have handrails. Any raised decks and porches should have guardrails on the edges. Have any leaves, snow, or ice cleared regularly. Use sand or salt on walking paths during winter. Clean up any spills in your garage right away. This includes oil or grease spills. What can I do in the bathroom? Use night lights. Install grab bars by the toilet and in the tub and shower. Do not use towel bars as grab bars. Use non-skid mats or decals in the tub or shower. If you need to sit down in the shower, use a plastic, non-slip stool. Keep the floor dry. Clean up any water that spills on the floor as soon as it happens. Remove soap buildup in the tub or shower regularly. Attach bath mats securely with double-sided non-slip rug tape. Do not have throw rugs and other things on the floor that can make you trip. What can I do in the bedroom? Use night lights. Make sure that you have a light by your bed that is easy to reach. Do not use any sheets or  blankets that are too big for your bed. They should not hang down onto the floor. Have a firm chair that has side arms. You can use this for support while you get dressed. Do not have throw rugs and other things on the floor that can make you trip. What can I do in the kitchen? Clean up any spills right away. Avoid walking on wet floors. Keep items that you use a lot in easy-to-reach places. If you need to reach something above you, use a strong step stool that has a grab bar. Keep electrical cords out of the way. Do not use floor polish or wax that makes floors slippery. If you must use wax, use non-skid floor wax. Do not have throw rugs and other things on the floor that can make you trip. What can I do with my stairs? Do not leave any items on the stairs. Make sure that there are handrails on both sides of the stairs and use them. Fix handrails that are broken or loose. Make sure that handrails are as long as the stairways. Check any carpeting to make sure that it is firmly attached to the stairs. Fix any carpet that is loose or worn. Avoid having throw rugs at the top or bottom of the stairs. If you do have throw rugs, attach them to the floor with carpet tape. Make sure that you have a light switch at the top of the stairs and the bottom of the stairs. If you do not have them, ask someone to add them for you. What else can I do to help prevent falls? Wear shoes that: Do not have high heels. Have rubber bottoms. Are comfortable and fit you well. Are closed at the toe. Do not wear sandals. If you use a stepladder: Make sure that it is fully opened. Do not climb a closed stepladder. Make sure that both sides of the stepladder are locked into place. Ask someone to hold it for you, if possible. Clearly mark and make sure that you can see: Any grab bars or handrails. First and last steps. Where the edge of each step is. Use tools that help you move around (mobility aids) if  they are  needed. These include: Canes. Walkers. Scooters. Crutches. Turn on the lights when you go into a dark area. Replace any light bulbs as soon as they burn out. Set up your furniture so you have a clear path. Avoid moving your furniture around. If any of your floors are uneven, fix them. If there are any pets around you, be aware of where they are. Review your medicines with your doctor. Some medicines can make you feel dizzy. This can increase your chance of falling. Ask your doctor what other things that you can do to help prevent falls. This information is not intended to replace advice given to you by your health care provider. Make sure you discuss any questions you have with your health care provider. Document Released: 12/10/2008 Document Revised: 07/22/2015 Document Reviewed: 03/20/2014 Elsevier Interactive Patient Education  2017 [employer].

## 2022-02-10 ENCOUNTER — Other Ambulatory Visit: Payer: Self-pay

## 2022-02-10 MED ORDER — "INSULIN SYRINGE 29G X 1/2"" 1 ML MISC": 1 refills | Status: AC

## 2022-03-03 ENCOUNTER — Ambulatory Visit: Payer: Medicare HMO | Admitting: Podiatry

## 2022-03-03 ENCOUNTER — Telehealth: Payer: Self-pay | Admitting: Podiatry

## 2022-03-03 ENCOUNTER — Ambulatory Visit (INDEPENDENT_AMBULATORY_CARE_PROVIDER_SITE_OTHER): Payer: Medicare HMO

## 2022-03-03 VITALS — BP 149/70 | HR 72

## 2022-03-03 DIAGNOSIS — E1149 Type 2 diabetes mellitus with other diabetic neurological complication: Secondary | ICD-10-CM

## 2022-03-03 DIAGNOSIS — L84 Corns and callosities: Secondary | ICD-10-CM | POA: Diagnosis not present

## 2022-03-03 DIAGNOSIS — Z89421 Acquired absence of other right toe(s): Secondary | ICD-10-CM | POA: Diagnosis not present

## 2022-03-03 DIAGNOSIS — M069 Rheumatoid arthritis, unspecified: Secondary | ICD-10-CM | POA: Diagnosis not present

## 2022-03-03 DIAGNOSIS — Z23 Encounter for immunization: Secondary | ICD-10-CM

## 2022-03-03 DIAGNOSIS — I509 Heart failure, unspecified: Secondary | ICD-10-CM | POA: Diagnosis not present

## 2022-03-03 DIAGNOSIS — M79675 Pain in left toe(s): Secondary | ICD-10-CM | POA: Diagnosis not present

## 2022-03-03 DIAGNOSIS — B351 Tinea unguium: Secondary | ICD-10-CM | POA: Diagnosis not present

## 2022-03-03 DIAGNOSIS — Z89411 Acquired absence of right great toe: Secondary | ICD-10-CM | POA: Diagnosis not present

## 2022-03-03 NOTE — Telephone Encounter (Signed)
Left message on voicemail for patient to call back to be scheduled for diabetic measurement.

## 2022-03-03 NOTE — Progress Notes (Unsigned)
Subjective: Chief Complaint  Patient presents with   Diabetes    Diabetic foot care, A1c-7.1 Bg-186, right foot callus    67 year old male presents the above concerns.  States he has a callus on the right foot but no open lesions.  Nails on the left foot are thickened discolored and has difficulty trimming them.  No open lesions.  No edema, erythema.   Objective: AAO x3, NAD DP/PT pulses palpable bilaterally, CRT less than 3 seconds Calls on the right medial forefoot without any underlying ulceration drainage or signs of infection. Nails are hypertrophic, dystrophic, brittle, discolored, elongated 5. No surrounding redness or drainage. Tenderness nails 1-5 on the left. No open lesions or pre-ulcerative lesions are identified today. Hammertoes left  Right TMA No pain with calf compression, swelling, warmth, erythema  Assessment: 67 year old male with callus right foot, symptomatic onychomycosis left foot  Plan: -All treatment options discussed with the patient including all alternatives, risks, complications.  -Sharply debrided the callus on the right foot without any complications or bleeding.  Discussed moisturizer, offloading.  Discussed stretching exercises for the Achilles tendon to hopefully take pressure off of the callus. -She will be debrided nails x 5 without any complications or bleeding. -Patient encouraged to call the office with any questions, concerns, change in symptoms.

## 2022-03-05 NOTE — Patient Instructions (Signed)

## 2022-03-06 ENCOUNTER — Ambulatory Visit (INDEPENDENT_AMBULATORY_CARE_PROVIDER_SITE_OTHER): Payer: Medicare HMO | Admitting: Family Medicine

## 2022-03-06 ENCOUNTER — Ambulatory Visit: Payer: Medicare HMO | Admitting: *Deleted

## 2022-03-06 ENCOUNTER — Other Ambulatory Visit: Payer: Self-pay | Admitting: Family Medicine

## 2022-03-06 VITALS — BP 124/62 | HR 71 | Ht 71.0 in | Wt 224.0 lb

## 2022-03-06 DIAGNOSIS — R1011 Right upper quadrant pain: Secondary | ICD-10-CM

## 2022-03-06 DIAGNOSIS — Z89439 Acquired absence of unspecified foot: Secondary | ICD-10-CM

## 2022-03-06 DIAGNOSIS — Z89431 Acquired absence of right foot: Secondary | ICD-10-CM | POA: Diagnosis not present

## 2022-03-06 DIAGNOSIS — E1149 Type 2 diabetes mellitus with other diabetic neurological complication: Secondary | ICD-10-CM

## 2022-03-06 DIAGNOSIS — G8929 Other chronic pain: Secondary | ICD-10-CM | POA: Diagnosis not present

## 2022-03-06 DIAGNOSIS — M2042 Other hammer toe(s) (acquired), left foot: Secondary | ICD-10-CM

## 2022-03-06 NOTE — Progress Notes (Signed)
Subjective:    Patient ID: Aaron Fox, male    DOB: 1955/09/20, 67 y.o.   MRN: [id number]  Patient presents today with 6 months of pain in his right upper quadrant.  The pain is located just below his ribs and is in his abdomen.  The result to palpation and palpated then thoroughly anteriorly laterally and posteriorly without eliciting any pain.  However he is tender in the right upper quadrant of his abdomen.  The pain is constant and seems to be more associated with movement.Marland Kitchen  He denies any pain with food.  He denies any melena.  He denies any hematochezia.  He denies any dysuria or urgency or frequency.  He denies any jaundice.  He denies any pleurisy.  However twisting elicits the pain in his abdomen. 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 Talbert Forest   CATARACT EXTRACTION, BILATERAL     COLONOSCOPY  2004   LYMPH  NODE BIOPSY Right 10/16/2014   Procedure: RIGHT INGUINAL LYMPH NODE BIOPSY;  Surgeon: Aviva Signs, MD;  Location: [facility];  Service: General;  Laterality: Right;   Current Outpatient Medications on File Prior to Visit  Medication Sig Dispense Refill   ACCU-CHEK GUIDE test strip TEST BLOOD SUGAR 7 TIMES DAILY 600 strip 3   Accu-Chek Softclix Lancets lancets TEST  7  TIMES  DAILY 600 each 3   Alcohol Swabs (B-D SINGLE USE SWABS REGULAR) PADS Use to cleanse site prior to use of lancet to obtain droplet of blood two times per day; E10.49 200 each 2   aspirin 81 MG tablet Take 81 mg by mouth daily.     atorvastatin (LIPITOR) 10 MG tablet TAKE 1 TABLET EVERY DAY 90 tablet 1   Blood Glucose Monitoring Suppl (ACCU-CHEK GUIDE ME) w/Device KIT 1 each by Does not apply route See admin instructions. Use to monitor glucose levels 7 times daily; E11.9; WILL NOT COMPLETE PA 1 kit 0   Dulaglutide (TRULICITY) 1.5 XV/4.0GQ SOPN Inject 1.5 mg into the skin once a week. 6 mL 1   gabapentin (NEURONTIN) 300 MG capsule TAKE 2 CAPSULES THREE TIMES DAILY 540 capsule 2   HYDROcodone bit-homatropine (HYCODAN) 5-1.5 MG/5ML syrup Take 5 mLs by mouth every 8 (eight) hours as needed for cough. 120 mL 0   insulin isophane & regular  human KwikPen (NOVOLIN 70/30 KWIKPEN) (70-30) 100 UNIT/ML KwikPen Inject 5 Units into the skin 2 (two) times daily before a meal. 15 mL 1   INSULIN SYRINGE 1CC/29G (DROPLET INSULIN SYRINGE) 29G X 1/2" 1 ML MISC Use to inject in morning and bedtime 200 each 1   losartan (COZAAR) 50 MG tablet TAKE 1 TABLET EVERY DAY 90 tablet 10   meloxicam (MOBIC) 15 MG tablet Take 1 tablet (15 mg total) by mouth daily. 30 tablet 0   mupirocin ointment (BACTROBAN) 2 % Apply 1 application topically 2 (two) times daily. 30 g 2   Tetrahydrozoline HCl (VISINE OP) Place 1 drop into both eyes daily as needed (redness).     triamcinolone cream (KENALOG) 0.1 % APPLY TO THE AFFECTED AREA(S) TOPICALLY THREE TIMES DAILY AS NEEDED  FOR RASH 45 g 2   No current facility-administered medications on file prior to visit.   Allergies  Allergen Reactions   Penicillins Swelling    Swelling of the hands and feet, skin peeling Did it involve swelling of the face/tongue/throat, SOB, or low BP? No Did it involve sudden or severe rash/hives, skin peeling, or any reaction on the inside of your mouth or nose? No Did you need to seek medical attention at a hospital or doctor's office? Yes When did it last happen?      10 + years If all above answers are "NO", may proceed with cephalosporin use.    Ciprofloxacin Hives and Rash   Terbinafine Hcl Hives   Zestril [Lisinopril] Cough   Social History   Socioeconomic History   Marital status: Married    Spouse name: Not on file   Number of children: 3   Years of education: Not on file   Highest education level: Not on file  Occupational History   Occupation: Disability    Employer: [employer]  Tobacco Use   Smoking status: Former    Types: Cigarettes    Quit date: 11/02/1999    Years since quitting: 22.3   Smokeless tobacco: Never  Vaping Use   Vaping Use: Never used  Substance and Sexual Activity   Alcohol use: No    Alcohol/week: 0.0 standard drinks of alcohol   Drug use: No   Sexual activity: Not on file  Other Topics Concern   Not on file  Social History Narrative   ** Merged History Encounter ** Married, 3 kids   Prior work [employer] before disability   Regular exercise-yes   Works some doing Film/video editor Determinants of Radio broadcast assistant Strain: [place]  (02/07/2022)   Overall Financial Resource Strain (CARDIA)    Difficulty of Paying Living Expenses: Not hard at all  Food Insecurity: No [place] (02/07/2022)   Hunger Vital Sign    Worried About Running Out of Food in the Last Year: Never true    [place] in the Last Year: Never true  Transportation Needs: No Transportation Needs (02/07/2022)   PRAPARE - Armed forces logistics/support/administrative officer (Medical): No    Lack of Transportation (Non-Medical): No  Physical Activity: Sufficiently Active (02/07/2022)   Exercise Vital Sign    Days of Exercise per Week: 7 days    Minutes of Exercise per Session: 60 min  Stress: No Stress Concern Present (02/07/2022)   [place]    Feeling of Stress : Not at all  Social Connections: Moderately Integrated (08/27/2020)  Social Connection and Isolation Panel [NHANES]    Frequency of Communication with Friends and Family: More than three times a week    Frequency of Social Gatherings with Friends and Family: Three times a week    Attends Religious Services: More than 4 times per year    Active Member of Clubs or Organizations: No    Attends Archivist Meetings: Never    Marital Status: Married  Human resources officer Violence: Not At Risk (02/07/2022)   Humiliation, Afraid, Rape, and Kick questionnaire    Fear of Current or Ex-Partner: No    Emotionally Abused: No    Physically Abused: No    Sexually Abused: No     Review of Systems  Respiratory:  Positive for cough.   All other systems reviewed and are negative.      Objective:   Physical Exam Vitals reviewed.  Constitutional:      General: He is not in acute distress.    Appearance: Normal appearance. He is normal weight. He is not ill-appearing, toxic-appearing or diaphoretic.  Cardiovascular:     Rate and Rhythm: Normal rate and regular rhythm.     Heart sounds: Normal heart sounds. No murmur heard. Pulmonary:     Effort: Pulmonary effort is normal. No respiratory distress.     Breath sounds: Normal breath sounds. No stridor. No wheezing, rhonchi or rales.  Chest:     Chest wall: No deformity, swelling, tenderness or crepitus.    Abdominal:     General: Abdomen is flat. Bowel sounds are normal. There is no distension.     Palpations: Abdomen is soft.     Tenderness: There is  abdominal tenderness. There is no guarding or rebound.    Lymphadenopathy:     Cervical: No cervical adenopathy.           Assessment & Plan:  RUQ pain - Plan: CBC with Differential/Platelet, COMPLETE METABOLIC PANEL WITH GFR Pain has been present now for more than 6 months.  Over the last 2 weeks it has gotten worse.  Given the location on the diagram, gallstones to be on the differential diagnosis however the nature and characteristic of the pain does not suggest gallstones or an ulcer or acid reflux.  It seems to be more musculoskeletal or soft tissue.  I believe a CT scan would image the liver and that area more thoroughly than an ultrasound.  Therefore I will order a CT scan of the abdomen as to evaluate further

## 2022-03-06 NOTE — Progress Notes (Unsigned)
Patient presents to the office today for diabetic shoe and insole measuring.  Patient was measured with brannock device to determine size and width for 1 pair of extra depth shoes and foam casted for 3 pair of insoles.   ABN signed.   Documentation of medical necessity will be sent to patient's treating diabetic doctor to verify and sign.   Patient's diabetic provider: DR. NIDA  Shoes and insoles will be ordered at that time and patient will be notified for an appointment for fitting when they arrive.   Brannock measurement: 12 W  Patient shoe selection-   1st   Shoe choice:   [id number]   Shoe size ordered: 12 W

## 2022-03-07 LAB — COMPLETE METABOLIC PANEL WITH GFR
AG Ratio: 1.1 (calc) (ref 1.0–2.5)
ALT: 14 U/L (ref 9–46)
AST: 21 U/L (ref 10–35)
Albumin: 4.1 g/dL (ref 3.6–5.1)
Alkaline phosphatase (APISO): 82 U/L (ref 35–144)
BUN: 11 mg/dL (ref 7–25)
CO2: 25 mmol/L (ref 20–32)
Calcium: 9.5 mg/dL (ref 8.6–10.3)
Chloride: 107 mmol/L (ref 98–110)
Creat: 1.03 mg/dL (ref 0.70–1.35)
Globulin: 3.8 g/dL (calc) — ABNORMAL HIGH (ref 1.9–3.7)
Glucose, Bld: 140 mg/dL — ABNORMAL HIGH (ref 65–99)
Potassium: 4.6 mmol/L (ref 3.5–5.3)
Sodium: 141 mmol/L (ref 135–146)
Total Bilirubin: 0.4 mg/dL (ref 0.2–1.2)
Total Protein: 7.9 g/dL (ref 6.1–8.1)
eGFR: 81 mL/min/{1.73_m2} (ref >=60)

## 2022-03-07 LAB — CBC WITH DIFFERENTIAL/PLATELET
Absolute Monocytes: 503 cells/uL (ref 200–950)
Basophils Absolute: 52 cells/uL (ref 0–200)
Basophils Relative: 1.1 %
Eosinophils Absolute: 273 cells/uL (ref 15–500)
Eosinophils Relative: 5.8 %
HCT: 35.7 % — ABNORMAL LOW (ref 38.5–50.0)
Hemoglobin: 12.3 g/dL — ABNORMAL LOW (ref 13.2–17.1)
Lymphs Abs: 1697 cells/uL (ref 850–3900)
MCH: 28.7 pg (ref 27.0–33.0)
MCHC: 34.5 g/dL (ref 32.0–36.0)
MCV: 83.4 fL (ref 80.0–100.0)
MPV: 11.6 fL (ref 7.5–12.5)
Monocytes Relative: 10.7 %
Neutro Abs: 2176 cells/uL (ref 1500–7800)
Neutrophils Relative %: 46.3 %
Platelets: 168 10*3/uL (ref 140–400)
RBC: 4.28 10*6/uL (ref 4.20–5.80)
RDW: 12.8 % (ref 11.0–15.0)
Total Lymphocyte: 36.1 %
WBC: 4.7 10*3/uL (ref 3.8–10.8)

## 2022-03-07 NOTE — Telephone Encounter (Signed)
Requested Prescriptions  Pending Prescriptions Disp Refills   atorvastatin (LIPITOR) 10 MG tablet [Pharmacy Med Name: ATORVASTATIN CALCIUM 10 MG Tablet] 90 tablet 0    Sig: TAKE 1 TABLET EVERY DAY     Cardiovascular:  Antilipid - Statins Failed - 03/06/2022 10:41 AM      Failed - Lipid Panel in normal range within the last 12 months    Cholesterol, Total  Date Value Ref Range Status  04/15/2021 154 100 - 199 mg/dL Final   LDL Cholesterol (Calc)  Date Value Ref Range Status  05/07/2020 65 mg/dL (calc) Final    Comment:    Reference range: <100 . Desirable range <100 mg/dL for primary prevention;   <70 mg/dL for patients with CHD or diabetic patients  with > or = 2 CHD risk factors. Marland Kitchen LDL-C is now calculated using the Martin-Hopkins  calculation, which is a validated novel method providing  better accuracy than the Friedewald equation in the  estimation of LDL-C.  Cresenciano Genre et al. Annamaria Helling. 3846;659(93): 2061-2068  (http://education.[facility].com/faq/FAQ164)    LDL Chol Calc (NIH)  Date Value Ref Range Status  04/15/2021 89 0 - 99 mg/dL Final   Direct LDL  Date Value Ref Range Status  04/04/2013 120.2 mg/dL Final    Comment:    Optimal:  <100 mg/dLNear or Above Optimal:  100-129 mg/dLBorderline High:  130-159 mg/dLHigh:  160-189 mg/dLVery High:  >190 mg/dL   HDL  Date Value Ref Range Status  04/15/2021 48 >39 mg/dL Final   Triglycerides  Date Value Ref Range Status  04/15/2021 89 0 - 149 mg/dL Final         Passed - Patient is not pregnant      Passed - Valid encounter within last 12 months    Recent Outpatient Visits           11 months ago [place] [name], MD   1 year ago Uncontrolled type 2 diabetes mellitus with skin complication, with long-term current use of insulin ([place])   [place] Pickard, Cammie Mcgee, MD   2 years ago Uncontrolled type 2 diabetes mellitus with skin complication, with  long-term current use of insulin ([place])   [place] [name], MD   2 years ago Uncontrolled type 2 diabetes mellitus with skin complication, with long-term current use of insulin ([place])   [place] Pickard, Cammie Mcgee, MD   3 years ago Establishing care with new doctor, encounter for   [place] Pickard, Cammie Mcgee, MD

## 2022-04-05 ENCOUNTER — Ambulatory Visit (INDEPENDENT_AMBULATORY_CARE_PROVIDER_SITE_OTHER): Payer: Medicare HMO

## 2022-04-05 DIAGNOSIS — E1149 Type 2 diabetes mellitus with other diabetic neurological complication: Secondary | ICD-10-CM

## 2022-04-05 DIAGNOSIS — Z89439 Acquired absence of unspecified foot: Secondary | ICD-10-CM

## 2022-04-05 NOTE — Progress Notes (Signed)
Patient presents today to pick up diabetic shoes and insoles.  Patient was dispensed 1 pair of diabetic shoes and 3 pairs of foam casted diabetic insoles. Fit was satisfactory. Instructions for break-in and wear was reviewed and a copy was given to the patient.   Re-appointment for regularly scheduled diabetic foot care visits or if they should experience any trouble with the shoes or insoles.  

## 2022-04-13 ENCOUNTER — Telehealth: Payer: Self-pay | Admitting: Podiatry

## 2022-04-13 NOTE — Telephone Encounter (Signed)
Prior authorization request and notes faxed to [facility] for diabetic shoes,  toe filler and inserts .  Documentation will be sent to scan center to be scanned in chart

## 2022-04-20 ENCOUNTER — Ambulatory Visit (HOSPITAL_COMMUNITY)
Admission: RE | Admit: 2022-04-20 | Discharge: 2022-04-20 | Disposition: A | Discharge status: Home / Self Care | Payer: Medicare HMO | Source: Ambulatory Visit | Attending: Family Medicine | Admitting: Family Medicine

## 2022-04-20 DIAGNOSIS — N281 Cyst of kidney, acquired: Secondary | ICD-10-CM | POA: Diagnosis not present

## 2022-04-20 DIAGNOSIS — R1011 Right upper quadrant pain: Secondary | ICD-10-CM | POA: Diagnosis not present

## 2022-04-20 DIAGNOSIS — N200 Calculus of kidney: Secondary | ICD-10-CM | POA: Diagnosis not present

## 2022-04-20 DIAGNOSIS — G8929 Other chronic pain: Secondary | ICD-10-CM | POA: Insufficient documentation

## 2022-04-20 LAB — POCT I-STAT CREATININE: Creatinine, Ser: 1.2 mg/dL (ref 0.61–1.24)

## 2022-04-20 MED ORDER — IOHEXOL 300 MG/ML  SOLN
100.0000 mL | Freq: Once | INTRAMUSCULAR | Status: AC | PRN
  Administered 2022-04-20: 100 mL via INTRAVENOUS

## 2022-04-25 ENCOUNTER — Telehealth: Payer: Self-pay | Admitting: Family Medicine

## 2022-04-25 NOTE — Telephone Encounter (Signed)
Patient called to follow up on recent CT scan. Requesting call back from provider with results and next steps.  Patient also following up on mail order for refill of   gabapentin (NEURONTIN) 300 MG capsule [id number]   Patient stated refill request was sent in about 2 weeks ago and he still hasn't received it.   Pharmacy confirmed as  [facility], [address], [place] [address] Phone: [phone]  Fax: [phone]   Please advise at [phone].

## 2022-04-26 ENCOUNTER — Telehealth: Payer: Self-pay | Admitting: Pharmacist

## 2022-04-26 ENCOUNTER — Other Ambulatory Visit: Payer: Self-pay

## 2022-04-26 NOTE — Progress Notes (Signed)
Care Management & Coordination Services Pharmacy Team  Reason for Encounter: Appointment Reminder  Contacted patient to confirm telephone appointment with Leata Mouse, PharmD on 04/27/2022 at 2 pm. Unsuccessful outreach. Left voicemail with appointment details.   Star Rating Drugs:  Atorvastatin 10 mg last filled [id number] 90 DS Trulicity last filled [id number] 28 DS Losartan 50 mg last filled 03/05/2022 90 DS   Care Gaps: Annual wellness visit in last year? Yes  If Diabetic: Last eye exam / retinopathy screening: 08/05/2021 Last diabetic foot exam: 04/09/2020   Future Appointments  Date Time Provider [place]  04/27/2022  2:00 PM Edythe Clarity, [place] None  06/02/2022  7:30 AM Trula Slade, DPM [facility]  06/02/2022  8:30 AM Brita Romp, NP [facility] None  02/13/2023 10:00 AM [facility]SE HEALTH ADVISOR [facility]   April D Calhoun, [place] Pharmacist Assistant [phone]

## 2022-04-27 ENCOUNTER — Ambulatory Visit: Payer: Medicare HMO | Admitting: Pharmacist

## 2022-04-27 ENCOUNTER — Other Ambulatory Visit: Payer: Self-pay | Admitting: Family Medicine

## 2022-04-27 MED ORDER — GABAPENTIN 300 MG PO CAPS: ORAL_CAPSULE | ORAL | 2 refills | Status: DC

## 2022-04-27 NOTE — Progress Notes (Signed)
Care Management & Coordination Services Pharmacy Note  04/28/2022 Name:  Aaron Fox MRN:  [id number] DOB:  09-18-1955  Summary: PharmD FU visit.  Patient experiencing stomach pain - he has stopped Trulicity because he feels this has been causing his stomach pain.  Reports sugars have been a little higher since then.  FU visit in April with PCP.  Recommendations/Changes made from today's visit: Recheck Lipids and A1c in April Also due to kidney eval  Follow up plan: FU 3 months   Subjective: Aaron Fox is an 67 y.o. year old male who is a primary patient of Fox, Aaron Mcgee, MD.  The care coordination team was consulted for assistance with disease management and care coordination needs.    Engaged with patient by telephone for follow up visit.  Recent office visits:  None since 12/13/20   Recent consult visits:  12/24/20 Podiatry Orland Dec, DPM. For foot ulcer. No medication changes. 12/17/20 Endo Brita Romp, NP. For follow-up. STOPPED Doxycyline.    Hospital visits:  None since 12/13/20   Objective:  Lab Results  Component Value Date   CREATININE 1.20 04/20/2022   BUN 11 03/06/2022   GFR 79.79 10/15/2017   EGFR 81 03/06/2022   GFRNONAA >60 08/27/2021   GFRAA 89 05/07/2020   NA 141 03/06/2022   K 4.6 03/06/2022   CALCIUM 9.5 03/06/2022   CO2 25 03/06/2022   GLUCOSE 140 (H) 03/06/2022    Lab Results  Component Value Date/Time   HGBA1C 7.5 (A) 01/27/2022 08:32 AM   HGBA1C 8.1 (A) 10/20/2021 08:50 AM   HGBA1C 7.4 (A) 04/22/2021 08:36 AM   HGBA1C 7.5 (A) 12/17/2020 08:42 AM   GFR 79.79 10/15/2017 08:18 AM   GFR 85.11 04/28/2016 08:04 AM   MICROALBUR 150 10/20/2021 08:50 AM   MICROALBUR 7.9 05/07/2020 08:16 AM   MICROALBUR 17.5 (H) 10/15/2017 08:18 AM    Last diabetic Eye exam:  Lab Results  Component Value Date/Time   HMDIABEYEEXA Retinopathy (A) 03/21/2019 11:16 AM    Last diabetic Foot exam:  Lab Results  Component  Value Date/Time   HMDIABFOOTEX yes 06/15/2008 12:00 AM     Lab Results  Component Value Date   CHOL 154 04/15/2021   HDL 48 04/15/2021   LDLCALC 89 04/15/2021   LDLDIRECT 120.2 04/04/2013   TRIG 89 04/15/2021   CHOLHDL 3.2 04/15/2021       Latest Ref Rng & Units 03/06/2022    4:28 PM 04/15/2021    8:26 AM 12/10/2020    8:47 AM  Hepatic Function  Total Protein 6.1 - 8.1 g/dL 7.9  7.2  7.6   Albumin 3.8 - 4.8 g/dL  4.2  4.4   AST 10 - 35 U/L 21  52  52   ALT 9 - 46 U/L 14  37  50   Alk Phosphatase 44 - 121 IU/L  116  110   Total Bilirubin 0.2 - 1.2 mg/dL 0.4  0.5  0.4     Lab Results  Component Value Date/Time   TSH 1.490 04/15/2021 08:26 AM   TSH 1.10 10/15/2017 08:18 AM   FREET4 1.32 04/15/2021 08:26 AM       Latest Ref Rng & Units 03/06/2022    4:28 PM 08/27/2021   10:08 AM 05/07/2020    8:16 AM  CBC  WBC 3.8 - 10.8 Thousand/uL 4.7  4.4  3.5   Hemoglobin 13.2 - 17.1 g/dL 12.3  12.4  12.5   Hematocrit  38.5 - 50.0 % 35.7  35.6  38.5   Platelets 140 - 400 Thousand/uL 168  186  240     Lab Results  Component Value Date/Time   VITAMINB12 313 04/04/2013 09:18 AM   VITAMINB12 283 11/13/2011 08:59 AM    Clinical ASCVD: No  The 10-year ASCVD risk score (Arnett DK, et al., 2019) is: 26.3%   Values used to calculate the score:     Age: 67 years     Sex: Male     Is Non-Hispanic African American: Yes     Diabetic: Yes     Tobacco smoker: No     Systolic Blood Pressure: A999333 mmHg     Is BP treated: Yes     HDL Cholesterol: 48 mg/dL     Total Cholesterol: 154 mg/dL        02/07/2022   10:17 AM 01/27/2022    1:29 PM 01/27/2022    1:27 PM  Depression screen PHQ 2/9  Decreased Interest 0 0 0  Down, Depressed, Hopeless 0 0 0  PHQ - 2 Score 0 0 0     Social History   Tobacco Use  Smoking Status Former   Types: Cigarettes   Quit date: 11/02/1999   Years since quitting: 22.5  Smokeless Tobacco Never   BP Readings from Last 3 Encounters:  03/06/22 124/62   03/03/22 (!) 149/70  01/30/22 128/72   Pulse Readings from Last 3 Encounters:  03/06/22 71  03/03/22 72  01/30/22 76   Wt Readings from Last 3 Encounters:  03/06/22 224 lb (101.6 kg)  02/07/22 225 lb (102.1 kg)  01/30/22 225 lb (102.1 kg)   BMI Readings from Last 3 Encounters:  03/06/22 31.24 kg/m  02/07/22 31.38 kg/m  01/30/22 31.38 kg/m    Allergies  Allergen Reactions   Penicillins Swelling    Swelling of the hands and feet, skin peeling Did it involve swelling of the face/tongue/throat, SOB, or low BP? No Did it involve sudden or severe rash/hives, skin peeling, or any reaction on the inside of your mouth or nose? No Did you need to seek medical attention at a hospital or doctor's office? Yes When did it last happen?      10 + years If all above answers are "NO", may proceed with cephalosporin use.    Ciprofloxacin Hives and Rash   Terbinafine Hcl Hives   Zestril [Lisinopril] Cough    Medications Reviewed Today     Reviewed by Edythe Clarity, [facility] (Pharmacist) on 04/28/22 at 1023  Med List Status: <None>   Medication Order Taking? Sig Documenting Provider Last Dose Status Informant  ACCU-CHEK GUIDE test strip [id number] Yes TEST BLOOD SUGAR 7 TIMES DAILY [name], MD Taking Active   Accu-Chek Softclix Lancets lancets [id number] Yes TEST  7  TIMES  DAILY Philemon Kingdom, MD Taking Active   Alcohol Swabs (B-D SINGLE USE SWABS REGULAR) PADS [id number] Yes Use to cleanse site prior to use of lancet to obtain droplet of blood two times per day; E10.49 Renato Shin, MD Taking Active Self  aspirin 81 MG tablet [id number] Yes Take 81 mg by mouth daily. [provider] Taking Active Self  atorvastatin (LIPITOR) 10 MG tablet [id number] Yes TAKE 1 TABLET EVERY DAY Fox, Aaron Mcgee, MD Taking Active   Blood Glucose Monitoring Suppl (ACCU-CHEK GUIDE ME) w/Device KIT [id number] Yes 1 each by Does not apply route See admin instructions. Use to monitor glucose  levels 7 times daily; E11.9;  WILL NOT COMPLETE PA [name], MD Taking Active   Dulaglutide (TRULICITY) 1.5 0000000 SOPN [id number] Yes Inject 1.5 mg into the skin once a week. Brita Romp, NP Taking Active   gabapentin (NEURONTIN) 300 MG capsule [id number] Yes TAKE 2 CAPSULES THREE TIMES DAILY [name], MD Taking Active   HYDROcodone bit-homatropine (HYCODAN) 5-1.5 MG/5ML syrup [id number] Yes Take 5 mLs by mouth every 8 (eight) hours as needed for cough. [name], MD Taking Active   insulin isophane & regular human KwikPen (NOVOLIN 70/30 KWIKPEN) (70-30) 100 UNIT/ML KwikPen [id number] Yes Inject 5 Units into the skin 2 (two) times daily before a meal. Reardon, Juanetta Beets, NP Taking Active   INSULIN SYRINGE 1CC/29G (DROPLET INSULIN SYRINGE) 29G X 1/2" 1 ML MISC [id number] Yes Use to inject in morning and bedtime Shamleffer, Melanie Crazier, MD Taking Active   losartan (COZAAR) 50 MG tablet [id number] Yes TAKE 1 TABLET EVERY DAY [name], MD Taking Active   meloxicam (MOBIC) 15 MG tablet [id number] Yes Take 1 tablet (15 mg total) by mouth daily. [name], MD Taking Active   mupirocin ointment (BACTROBAN) 2 % [id number] Yes Apply 1 application topically 2 (two) times daily. Trula Slade, DPM Taking Active   Tetrahydrozoline HCl (VISINE OP) [id number] Yes Place 1 drop into both eyes daily as needed (redness). [provider] Taking Active Self  triamcinolone cream (KENALOG) 0.1 % [id number] Yes APPLY TO THE AFFECTED AREA(S) TOPICALLY THREE TIMES DAILY AS NEEDED FOR RASH Fox, Aaron Mcgee, MD Taking Active             SDOH:  (Social Determinants of Health) assessments and interventions performed: No, done within the year Financial Resource Strain: Low Risk  (02/07/2022)   Overall Financial Resource Strain (CARDIA)    Difficulty of Paying Living Expenses: Not hard at all   Food Insecurity: No Food Insecurity (02/07/2022)   Hunger Vital Sign     Worried About Running Out of Food in the Last Year: Never true    Ran Out of Food in the Last Year: Never true    SDOH Interventions    Flowsheet Row Clinical Support from 02/07/2022 in [place] Telephone from 01/27/2022 in [place] from 08/27/2020 in [place]  SDOH Interventions     Food Insecurity Interventions Intervention Not Indicated -- Intervention Not Indicated  Housing Interventions Intervention Not Indicated Intervention Not Indicated Intervention Not Indicated  Transportation Interventions Intervention Not Indicated Intervention Not Indicated Intervention Not Indicated  Utilities Interventions Intervention Not Indicated Intervention Not Indicated --  Alcohol Usage Interventions Intervention Not Indicated (Score <7) -- --  Financial Strain Interventions Intervention Not Indicated Intervention Not Indicated Intervention Not Indicated  Physical Activity Interventions Intervention Not Indicated Intervention Not Indicated Intervention Not Indicated  Stress Interventions Intervention Not Indicated -- Intervention Not Indicated  Social Connections Interventions -- -- Intervention Not Indicated       Medication Assistance: None required.  Patient affirms current coverage meets needs.  Medication Access: Within the past 30 days, how often has patient missed a dose of medication? 0 Is a pillbox or other method used to improve adherence? Yes  Factors that may affect medication adherence? adverse effects of medications Are meds synced by current pharmacy? No  Are meds delivered by current pharmacy? No  Does patient experience delays in picking up medications due to transportation concerns? No   [facility] Reviewed:  Is patient disadvantaged to use [facility]?: Yes  Current Rx insurance plan: [employer] Name and location of Current pharmacy:  [place], [name] - [name] [address] [name] [address] Phone: [phone] Fax: [phone]  [place], [address] [place] [address] Phone: [phone] Fax: [phone]  [facility] services reviewed with patient today?: Yes  Patient requests to transfer care to [facility]?: No  Reason patient declined to change pharmacies: Disadvantaged due to insurance/mail order  Compliance/Adherence/Medication fill history: Annual wellness visit in last year? Yes   If Diabetic: Last eye exam / retinopathy screening: 08/05/2021 Last diabetic foot exam: 04/09/2020   Assessment/Plan     Hypertension (BP goal <130/80) 10/06/21 -Controlled, not assessed -Current treatment: Losartan '50mg'$  daily Appropriate, Effective, Safe, Accessible -Medications previously tried: none noted  -Current home readings: does not have meter at home -Current dietary habits: same -Current exercise habits: still helping mow lawns -Educated on BP goals and benefits of medications for prevention of heart attack, stroke and kidney damage; Daily salt intake goal < 2300 mg; Exercise goal of 150 minutes per week; Importance of home blood pressure monitoring; -BP controlled most recently in office.  No need for changes at this time.   Update 10/07/20 Not checking BP at home Office BP's remain controlled - denies any dizziness or HA's at home. 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 Does not check BP at home. Encouraged him to remain active. Last BP in office was elevated, otherwise has been controlled. Continue to monitor for now. No changes in meds needed.  Hyperlipidemia: (LDL goal < 70) 04/28/22 -Uncontrolled at this time - last LDL is 89 -Current treatment: Atorvastatin '10mg'$  Appropriate, Query effective, ,   -Medications previously tried: Simvastatin (efficacy?)  -Current dietary patterns: see above -Current exercise habits: see above -Educated on Cholesterol goals;  Benefits of statin for ASCVD risk reduction; Importance of limiting foods high in cholesterol;  -Due for updated lipid panel - LDL goal for patient with DM is < 70.  Recommend recheck lipid panel at next OV.  If elevated again, would recommend increasing atorvastatin to '20mg'$ .  No changes at this time.  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 Uncontrolled, most recent A1c in 7.5% -Current medications: Novolin 70/30 10 units BID Appropriate, Query effective, ,   -Medications previously tried: Novolin R, trulicity -Current home glucose readings Glucose has been fluctuating per patient report, no specific logs provided today -Denies hypoglycemic/hyperglycemic symptoms -Current meal patterns: same see previous -Current exercise: same see previous -Educated on A1c and blood sugar goals; Complications of diabetes including kidney damage, retinal damage, and cardiovascular disease; Exercise goal of 150 minutes per week; Prevention and management of hypoglycemic episodes; Benefits of routine self-monitoring of blood sugar;  -Today he complains of pain in the stomach region.  This has caused him to stop Trulicity because he believes this is what has caused his stomach pain.  It has improved slightly since stopping but still lingers.  Denies constipation.  CT scan was normal of abdomen.  He noted that his sugars have been higher off trulicity.  Unclear what is causing this pain. At this time we made no changes - work on dietary mods. If A1c is elevated at next visit may need to titrate insulin or look at other options such  as SGLT-2.  Update 10/07/20 Changed to Novoling 70/30 10 units bid A1c improved to 7.0! Fasting Sugars - 90-150s He is now back to watching what he eats and sugars are much more  controlled. Congratulated on efforts and lifestyle mods. Continue current meds for now.  Update 02/10/21 Fasting Sugars - 126 this morning,  Continues to lay off the sweets Remains on 10 units BID of 70/30 insulin. Denies any hypoglycemia. Currently he states his biggest concern is that his portion sizes are too large. Suggested he use a smaller plate as this sometimes helps you decrease portion size. Last POC A1c was 7.5 which increased from 7.0. Encouraged him to work on on diet/exercise.   He is motivated to get on track and is agreeable to plan.             Beverly Milch, PharmD, CPP Clinical Pharmacist Practitioner [place] [phone]

## 2022-06-02 ENCOUNTER — Other Ambulatory Visit: Payer: Self-pay | Admitting: Family Medicine

## 2022-06-02 ENCOUNTER — Encounter: Payer: Self-pay | Admitting: Nurse Practitioner

## 2022-06-02 ENCOUNTER — Ambulatory Visit: Payer: Medicare HMO | Admitting: Podiatry

## 2022-06-02 ENCOUNTER — Ambulatory Visit: Payer: Medicare HMO | Admitting: Nurse Practitioner

## 2022-06-02 VITALS — BP 130/71 | HR 56 | Ht 71.0 in | Wt 223.0 lb

## 2022-06-02 DIAGNOSIS — E1149 Type 2 diabetes mellitus with other diabetic neurological complication: Secondary | ICD-10-CM | POA: Diagnosis not present

## 2022-06-02 DIAGNOSIS — I110 Hypertensive heart disease with heart failure: Secondary | ICD-10-CM | POA: Diagnosis not present

## 2022-06-02 DIAGNOSIS — M069 Rheumatoid arthritis, unspecified: Secondary | ICD-10-CM | POA: Diagnosis not present

## 2022-06-02 DIAGNOSIS — L84 Corns and callosities: Secondary | ICD-10-CM | POA: Diagnosis not present

## 2022-06-02 DIAGNOSIS — E1122 Type 2 diabetes mellitus with diabetic chronic kidney disease: Secondary | ICD-10-CM | POA: Diagnosis not present

## 2022-06-02 DIAGNOSIS — Z794 Long term (current) use of insulin: Secondary | ICD-10-CM | POA: Diagnosis not present

## 2022-06-02 DIAGNOSIS — B351 Tinea unguium: Secondary | ICD-10-CM | POA: Diagnosis not present

## 2022-06-02 DIAGNOSIS — E1165 Type 2 diabetes mellitus with hyperglycemia: Secondary | ICD-10-CM

## 2022-06-02 DIAGNOSIS — E1140 Type 2 diabetes mellitus with diabetic neuropathy, unspecified: Secondary | ICD-10-CM | POA: Diagnosis not present

## 2022-06-02 DIAGNOSIS — Z89421 Acquired absence of other right toe(s): Secondary | ICD-10-CM | POA: Diagnosis not present

## 2022-06-02 DIAGNOSIS — E119 Type 2 diabetes mellitus without complications: Secondary | ICD-10-CM | POA: Diagnosis not present

## 2022-06-02 DIAGNOSIS — Z89411 Acquired absence of right great toe: Secondary | ICD-10-CM | POA: Diagnosis not present

## 2022-06-02 DIAGNOSIS — I509 Heart failure, unspecified: Secondary | ICD-10-CM | POA: Diagnosis not present

## 2022-06-02 DIAGNOSIS — M79675 Pain in left toe(s): Secondary | ICD-10-CM | POA: Diagnosis not present

## 2022-06-02 DIAGNOSIS — E11319 Type 2 diabetes mellitus with unspecified diabetic retinopathy without macular edema: Secondary | ICD-10-CM | POA: Diagnosis not present

## 2022-06-02 LAB — POCT GLYCOSYLATED HEMOGLOBIN (HGB A1C): Hemoglobin A1C: 7.8 % — AB (ref 4.0–5.6)

## 2022-06-02 MED ORDER — NOVOLIN 70/30 FLEXPEN RELION (70-30) 100 UNIT/ML ~~LOC~~ SUPN: 10.0000 [IU] | PEN_INJECTOR | Freq: Two times a day (BID) | SUBCUTANEOUS | 1 refills | Status: AC

## 2022-06-02 NOTE — Progress Notes (Signed)
Endocrinology Follow Up Note       06/02/2022, 9:33 AM   Subjective:    Patient ID: Aaron Fox, male    DOB: 12-20-1955.  Aaron Fox is being seen in follow up after being seen in consultation for management of currently uncontrolled symptomatic diabetes requested by  Donita Brooks, MD.   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 Vonna Kotyk   CATARACT EXTRACTION, BILATERAL     COLONOSCOPY  2004   LYMPH NODE BIOPSY Right 10/16/2014   Procedure: RIGHT INGUINAL LYMPH NODE BIOPSY;  Surgeon: Franky Macho, MD;  Location: [facility];  Service: General;  Laterality: Right;    Social History   Socioeconomic History   Marital status: Married    Spouse name: Not on file   Number of children: 3   Years of education: Not on file   Highest education  level: Not on file  Occupational History   Occupation: Disability    Employer: [employer]  Tobacco Use   Smoking status: Former    Types: Cigarettes    Quit date: 11/02/1999    Years since quitting: 22.5   Smokeless tobacco: Never  Vaping Use   Vaping Use: Never used  Substance and Sexual Activity   Alcohol use: No    Alcohol/week: 0.0 standard drinks of alcohol   Drug use: No   Sexual activity: Not on file  Other Topics Concern   Not on file  Social History Narrative   ** Merged History Encounter ** Married, 3 kids   Prior work [employer] before disability   Regular exercise-yes   Works some doing Air traffic controller Determinants of Corporate investment banker Strain: Low Risk  (02/07/2022)   Overall Financial Resource Strain (CARDIA)    Difficulty of Paying Living Expenses: Not hard at all  Food Insecurity: No Food Insecurity (02/07/2022)   Hunger Vital Sign    Worried About Running Out of Food in the Last Year: Never true  Ran Out of Food in the Last Year: Never true  Transportation Needs: No Transportation Needs (02/07/2022)   PRAPARE - Administrator, Civil Service (Medical): No    Lack of Transportation (Non-Medical): No  Physical Activity: Sufficiently Active (02/07/2022)   Exercise Vital Sign    Days of Exercise per Week: 7 days    Minutes of Exercise per Session: 60 min  Stress: No Stress Concern Present (02/07/2022)   [employer] of Occupational Health - Occupational Stress Questionnaire    Feeling of Stress : Not at all  Social Connections: Moderately Integrated (08/27/2020)   Social Connection and Isolation Panel [NHANES]    Frequency of Communication with Friends and Family: More than three times a week    Frequency of Social Gatherings with Friends and Family: Three times a week    Attends Religious Services: More than 4 times per year    Active Member of Clubs or Organizations: No    Attends Banker Meetings: Never     Marital Status: Married    Family History  Problem Relation Age of Onset   Heart disease Other        FH of CAD   Colon polyps Paternal Uncle    Kidney disease Paternal Uncle    Diabetes Paternal Grandfather    Diabetes Maternal Uncle    Cancer Neg Hx    Colon cancer Neg Hx    Gallbladder disease Neg Hx    Esophageal cancer Neg Hx    Rectal cancer Neg Hx    Stomach cancer Neg Hx     Outpatient Encounter Medications as of 06/02/2022  Medication Sig   ACCU-CHEK GUIDE test strip TEST BLOOD SUGAR 7 TIMES DAILY   Accu-Chek Softclix Lancets lancets TEST  7  TIMES  DAILY   Alcohol Swabs (B-D SINGLE USE SWABS REGULAR) PADS Use to cleanse site prior to use of lancet to obtain droplet of blood two times per day; E10.49   aspirin 81 MG tablet Take 81 mg by mouth daily.   atorvastatin (LIPITOR) 10 MG tablet TAKE 1 TABLET EVERY DAY   Blood Glucose Monitoring Suppl (ACCU-CHEK GUIDE ME) w/Device KIT 1 each by Does not apply route See admin instructions. Use to monitor glucose levels 7 times daily; E11.9; WILL NOT COMPLETE PA   gabapentin (NEURONTIN) 300 MG capsule TAKE 2 CAPSULES THREE TIMES DAILY   HYDROcodone bit-homatropine (HYCODAN) 5-1.5 MG/5ML syrup Take 5 mLs by mouth every 8 (eight) hours as needed for cough.   INSULIN SYRINGE 1CC/29G (DROPLET INSULIN SYRINGE) 29G X 1/2" 1 ML MISC Use to inject in morning and bedtime   losartan (COZAAR) 50 MG tablet TAKE 1 TABLET EVERY DAY   meloxicam (MOBIC) 15 MG tablet Take 1 tablet (15 mg total) by mouth daily.   mupirocin ointment (BACTROBAN) 2 % Apply 1 application topically 2 (two) times daily.   Tetrahydrozoline HCl (VISINE OP) Place 1 drop into both eyes daily as needed (redness).   triamcinolone cream (KENALOG) 0.1 % APPLY TO THE AFFECTED AREA(S) TOPICALLY THREE TIMES DAILY AS NEEDED FOR RASH   [DISCONTINUED] Dulaglutide (TRULICITY) 1.5 MG/0.5ML SOPN Inject 1.5 mg into the skin once a week.   [DISCONTINUED] insulin isophane & regular human  KwikPen (NOVOLIN 70/30 KWIKPEN) (70-30) 100 UNIT/ML KwikPen Inject 5 Units into the skin 2 (two) times daily before a meal.   insulin isophane & regular human KwikPen (NOVOLIN 70/30 KWIKPEN) (70-30) 100 UNIT/ML KwikPen Inject 10 Units into the skin 2 (  two) times daily before a meal.   No facility-administered encounter medications on file as of 06/02/2022.    ALLERGIES: Allergies  Allergen Reactions   Penicillins Swelling    Swelling of the hands and feet, skin peeling Did it involve swelling of the face/tongue/throat, SOB, or low BP? No Did it involve sudden or severe rash/hives, skin peeling, or any reaction on the inside of your mouth or nose? No Did you need to seek medical attention at a hospital or doctor's office? Yes When did it last happen?      10 + years If all above answers are "NO", may proceed with cephalosporin use.    Ciprofloxacin Hives and Rash   Terbinafine Hcl Hives   Zestril [Lisinopril] Cough    VACCINATION STATUS: Immunization History  Administered Date(s) Administered   Fluad Quad(high Dose 65+) 03/03/2022   Influenza Whole 12/28/2008   Influenza,inj,Quad PF,6+ Mos 11/21/2013, 04/01/2015, 12/20/2015, 01/08/2017, 12/17/2017, 10/25/2018, 12/19/2019, 03/28/2021   Moderna Sars-Covid-2 Vaccination 04/29/2019, 05/27/2019, 12/29/2019   Pneumococcal Polysaccharide-23 06/11/2008   Td 09/29/2005   Tdap 10/15/2017   Zoster Recombinat (Shingrix) 06/14/2020, 08/25/2020    Diabetes He presents for his follow-up diabetic visit. He has type 2 diabetes mellitus. Onset time: was diagnosed at approx age of 38. His disease course has been fluctuating. Pertinent negatives for hypoglycemia include no sweats or tremors. Associated symptoms include foot paresthesias. Pertinent negatives for diabetes include no blurred vision, no chest pain, no fatigue, no polydipsia, no polyphagia, no polyuria and no weight loss. There are no hypoglycemic complications. Symptoms are stable. Diabetic  complications include heart disease, impotence, nephropathy, peripheral neuropathy and retinopathy. 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 Current diabetic treatment includes insulin injections. He is compliant with treatment most of the time. His weight is decreasing steadily. He is following a generally healthy diet. When asked about meal planning, he reported none. He has not had a previous visit with a dietitian. He participates in exercise three times a week. His home blood glucose trend is fluctuating minimally. His breakfast blood glucose range is generally 140-180 mg/dl. His dinner blood glucose range is generally 140-180 mg/dl. (He presents today with his logs and meter showing slightly above target glycemic profile.  His POCT A1c today is 7.8%, increasing slightly from last visit of 7.5%.  He had to stop his Trulicity between appointments for abdominal cramping.  Says the cramping is still present but much better since discontinuation.  As a result, he did increase his insulin to 10 units BID with meals.  He denies any significant hypoglycemia, does report his bedtime reading is sometimes better than the next morning fasting reading.  Analysis of his meter shows 7-day average of 157, 14-day average of 165, 30-day average of 161; and 90-day average of 158.) An ACE inhibitor/angiotensin II receptor blocker is being taken. He sees a podiatrist.Eye exam is current.  Hyperlipidemia This is a chronic problem. The current episode started more than 1 year ago. The problem is controlled. Recent lipid tests were reviewed and are normal. Exacerbating diseases include chronic renal disease and diabetes. There are no known factors aggravating his hyperlipidemia. Pertinent negatives include no chest pain. Current antihyperlipidemic treatment includes statins. The current treatment  provides moderate improvement of lipids. There are no compliance problems.  Risk factors for coronary artery disease include diabetes mellitus, dyslipidemia, hypertension and male sex.  Hypertension This is a chronic  problem. The current episode started more than 1 year ago. The problem has been gradually improving since onset. The problem is controlled. Pertinent negatives include no blurred vision, chest pain or sweats. There are no associated agents to hypertension. Risk factors for coronary artery disease include diabetes mellitus, dyslipidemia and male gender. Past treatments include angiotensin blockers. The current treatment provides moderate improvement. There are no compliance problems.  Hypertensive end-organ damage includes kidney disease, heart failure and retinopathy. Identifiable causes of hypertension include chronic renal disease.    Review of systems  Constitutional: + Minimally fluctuating body weight,  current Body mass index is 31.1 kg/m. , no fatigue, no subjective hyperthermia, no subjective hypothermia Eyes: no blurry vision, no xerophthalmia ENT: no sore throat, no nodules palpated in throat, no dysphagia/odynophagia, no hoarseness Cardiovascular: no chest pain, no shortness of breath, no palpitations, no leg swelling Respiratory: no cough, no shortness of breath Gastrointestinal: no nausea/vomiting/diarrhea Musculoskeletal: no muscle/joint aches Skin: no rashes, no hyperemia Neurological: no tremors, no numbness, no tingling, no dizziness Psychiatric: no depression, no anxiety  Objective:     BP 130/71 (BP Location: Left Arm, Patient Position: Sitting, Cuff Size: Large)   Pulse (!) 56   Ht 5\' 11"  (1.803 m)   Wt 223 lb (101.2 kg)   BMI 31.10 kg/m   Wt Readings from Last 3 Encounters:  06/02/22 223 lb (101.2 kg)  03/06/22 224 lb (101.6 kg)  02/07/22 225 lb (102.1 kg)     BP Readings from Last 3 Encounters:  06/02/22 130/71  03/06/22 124/62  03/03/22 (!)  149/70      Physical Exam- Limited  Constitutional:  Body mass index is 31.1 kg/m. , not in acute distress, normal state of mind Eyes:  EOMI, no exophthalmos Musculoskeletal: no gross deformities, strength intact in all four extremities, no gross restriction of joint movements Skin:  no rashes, no hyperemia Neurological: no tremor with outstretched hands    CMP ( most recent) CMP     Component Value Date/Time   NA 141 03/06/2022 1628   NA 141 04/15/2021 0826   K 4.6 03/06/2022 1628   CL 107 03/06/2022 1628   CO2 25 03/06/2022 1628   GLUCOSE 140 (H) 03/06/2022 1628   BUN 11 03/06/2022 1628   BUN 19 04/15/2021 0826   CREATININE 1.20 04/20/2022 0809   CREATININE 1.03 03/06/2022 1628   CALCIUM 9.5 03/06/2022 1628   PROT 7.9 03/06/2022 1628   PROT 7.2 04/15/2021 0826   ALBUMIN 4.2 04/15/2021 0826   AST 21 03/06/2022 1628   ALT 14 03/06/2022 1628   ALKPHOS 116 04/15/2021 0826   BILITOT 0.4 03/06/2022 1628   BILITOT 0.5 04/15/2021 0826   GFRNONAA >60 08/27/2021 1008   GFRNONAA 76 05/07/2020 0816   GFRAA 89 05/07/2020 0816     Diabetic Labs (most recent): Lab Results  Component Value Date   HGBA1C 7.8 (A) 06/02/2022   HGBA1C 7.5 (A) 01/27/2022   HGBA1C 8.1 (A) 10/20/2021   MICROALBUR 150 10/20/2021   MICROALBUR 7.9 05/07/2020   MICROALBUR 17.5 (H) 10/15/2017     Lipid Panel ( most recent) Lipid Panel     Component Value Date/Time   CHOL 154 04/15/2021 0826   TRIG 89 04/15/2021 0826   HDL 48 04/15/2021 0826   CHOLHDL 3.2 04/15/2021 0826   CHOLHDL 2.7 05/07/2020 0816   VLDL 33.0 10/15/2017 0818   LDLCALC 89 04/15/2021 0826   LDLCALC 65 05/07/2020 0816   LDLDIRECT 120.2 04/04/2013 [id number]  LABVLDL 17 04/15/2021 0826      Lab Results  Component Value Date   TSH 1.490 04/15/2021   TSH 1.10 10/15/2017   TSH 1.63 04/28/2016   TSH 0.99 05/21/2014   TSH 1.43 04/04/2013   TSH 1.28 11/13/2011   TSH 0.77 03/29/2010   TSH 0.95 09/01/2009   TSH 0.71  02/08/2009   TSH 0.75 02/07/2008   FREET4 1.32 04/15/2021           Assessment & Plan:   1) Controlled Type 2 Diabetes without complication  He presents today with his logs and meter showing slightly above target glycemic profile.  His POCT A1c today is 7.8%, increasing slightly from last visit of 7.5%.  He had to stop his Trulicity between appointments for abdominal cramping.  Says the cramping is still present but much better since discontinuation.  As a result, he did increase his insulin to 10 units BID with meals.  He denies any significant hypoglycemia, does report his bedtime reading is sometimes better than the next morning fasting reading.  Analysis of his meter shows 7-day average of 157, 14-day average of 165, 30-day average of 161; and 90-day average of 158.  - Aaron Fox has currently uncontrolled symptomatic type 2 DM since 67 years of age.  -Recent labs reviewed.  - I had a long discussion with him about the progressive nature of diabetes and the pathology behind its complications. -his diabetes is complicated by retinopathy, CKD, neuropathy, recent TMA and he remains at a high risk for more acute and chronic complications which include CAD, CVA, CKD, retinopathy, and neuropathy. These are all discussed in detail with him.  - Nutritional counseling repeated at each appointment due to patients tendency to fall back in to old habits.  - The patient admits there is a room for improvement in their diet and drink choices. -  Suggestion is made for the patient to avoid simple carbohydrates from their diet including Cakes, Sweet Desserts / Pastries, Ice Cream, Soda (diet and regular), Sweet Tea, Candies, Chips, Cookies, Sweet Pastries, Store Bought Juices, Alcohol in Excess of 1-2 drinks a day, Artificial Sweeteners, Coffee Creamer, and "Sugar-free" Products. This will help patient to have stable blood glucose profile and potentially avoid unintended weight gain.   - I  encouraged the patient to switch to unprocessed or minimally processed complex starch and increased protein intake (animal or plant source), fruits, and vegetables.   - Patient is advised to stick to a routine mealtimes to eat 3 meals a day and avoid unnecessary snacks (to snack only to correct hypoglycemia).  - I have approached him with the following individualized plan to manage  his diabetes and patient agrees:   -He is advised to adjust his 70/30 insulin to 10 units with breakfast and 5 units with supper if glucose is above 90 and he is eating.  He is advised to stay off the Trulicity at this time due to GI SE.  -he is encouraged to continue monitoring blood glucose 3 times daily, before injecting insulin (at breakfast and supper) and before bed, and to call the clinic if he has readings less than 70 or greater than 300 for 3 tests in a row.   - he is warned not to take insulin without proper monitoring per orders. - Adjustment parameters are given to him for hypo and hyperglycemia in writing.  - Specific targets for  A1c;  LDL, HDL,  and Triglycerides were discussed with the patient.  2) Blood Pressure /Hypertension:  his blood pressure is controlled to target.   he is advised to continue his current medications including Cozaar 50 mg p.o. daily with breakfast.    3) Lipids/Hyperlipidemia:    Review of his recent lipid panel from 04/15/21 showed controlled  LDL at 89 .  he  is advised to continue Lipitor 10 mg daily at bedtime.  Side effects and precautions discussed with him.  Will recheck lipid panel prior to next visit.  4)  Weight/Diet:  his Body mass index is 31.1 kg/m.  -   clearly complicating his diabetes care.   he is a candidate for weight loss. I discussed with him the fact that loss of 5 - 10% of his  current body weight will have the most impact on his diabetes management.  Exercise, and detailed carbohydrates information provided  -  detailed on discharge instructions.  5)  Chronic Care/Health Maintenance: -he is on ACEI/ARB and Statin medications and is encouraged to initiate and continue to follow up with Ophthalmology, Dentist, Podiatrist at least yearly or according to recommendations, and advised to stay away from smoking. I have recommended yearly flu vaccine and pneumonia vaccine at least every 5 years; moderate intensity exercise for up to 150 minutes weekly; and sleep for at least 7 hours a day.  - he is advised to maintain close follow up with Donita Brooks, MD for primary care needs, as well as his other providers for optimal and coordinated care.     I spent  39  minutes in the care of the patient today including review of labs from CMP, Lipids, Thyroid Function, Hematology (current and previous including abstractions from other facilities); face-to-face time discussing  his blood glucose readings/logs, discussing hypoglycemia and hyperglycemia episodes and symptoms, medications doses, his options of short and long term treatment based on the latest standards of care / guidelines;  discussion about incorporating lifestyle medicine;  and documenting the encounter. Risk reduction counseling performed per USPSTF guidelines to reduce obesity and cardiovascular risk factors.     Please refer to Patient Instructions for Blood Glucose Monitoring and Insulin/Medications Dosing Guide"  in media tab for additional information. Please  also refer to " Patient Self Inventory" in the Media  tab for reviewed elements of pertinent patient history.  Aaron Fox participated in the discussions, expressed understanding, and voiced agreement with the above plans.  All questions were answered to his satisfaction. he is encouraged to contact clinic should he have any questions or concerns prior to his return visit.   Follow up plan: - Return in about 4 months (around 10/02/2022) for Diabetes F/U with A1c in office, No previsit labs, Bring meter and logs.  Ronny Bacon, [facility] [address] [place], [place] [address] Phone: [phone] Fax: [phone]  06/02/2022, 9:33 AM

## 2022-06-02 NOTE — Telephone Encounter (Signed)
Requested Prescriptions  Pending Prescriptions Disp Refills   atorvastatin (LIPITOR) 10 MG tablet [Pharmacy Med Name: ATORVASTATIN CALCIUM 10 MG Tablet] 90 tablet 0    Sig: TAKE 1 TABLET EVERY DAY     Cardiovascular:  Antilipid - Statins Failed - 06/02/2022 10:58 AM      Failed - Valid encounter within last 12 months    Recent Outpatient Visits           1 year ago Pleurisy   [facility] Donita Brooks, MD   2 years ago Uncontrolled type 2 diabetes mellitus with skin complication, with long-term current use of insulin (HCC)   [facility] Pickard, Priscille Heidelberg, MD   3 years ago Uncontrolled type 2 diabetes mellitus with skin complication, with long-term current use of insulin (HCC)   [facility] Pickard, Priscille Heidelberg, MD   3 years ago Uncontrolled type 2 diabetes mellitus with skin complication, with long-term current use of insulin (HCC)   [facility] Pickard, Priscille Heidelberg, MD   3 years ago Establishing care with new doctor, encounter for   [facility] Donita Brooks, MD              Failed - Lipid Panel in normal range within the last 12 months    Cholesterol, Total  Date Value Ref Range Status  04/15/2021 154 100 - 199 mg/dL Final   LDL Cholesterol (Calc)  Date Value Ref Range Status  05/07/2020 65 mg/dL (calc) Final    Comment:    Reference range: <100 . Desirable range <100 mg/dL for primary prevention;   <70 mg/dL for patients with CHD or diabetic patients  with > or = 2 CHD risk factors. Marland Kitchen LDL-C is now calculated using the Martin-Hopkins  calculation, which is a validated novel method providing  better accuracy than the Friedewald equation in the  estimation of LDL-C.  Horald Pollen et al. Lenox Ahr. 0867;619(50): 2061-2068  (http://education.[facility].com/faq/FAQ164)    LDL Chol Calc (NIH)  Date Value Ref Range Status  04/15/2021 89 0 - 99 mg/dL Final   Direct LDL  Date Value Ref  Range Status  04/04/2013 120.2 mg/dL Final    Comment:    Optimal:  <100 mg/dLNear or Above Optimal:  100-129 mg/dLBorderline High:  130-159 mg/dLHigh:  160-189 mg/dLVery High:  >190 mg/dL   HDL  Date Value Ref Range Status  04/15/2021 48 >39 mg/dL Final   Triglycerides  Date Value Ref Range Status  04/15/2021 89 0 - 149 mg/dL Final         Passed - Patient is not pregnant

## 2022-06-03 NOTE — Progress Notes (Signed)
Subjective: Chief Complaint  Patient presents with   Nail Problem    Thick painful toenails, 3 month follow up    67 year old male presents the above concerns.  States he has a callus on the right foot but no open lesions.  Nails on left foot are hypertrophic, dystrophic and causing discomfort is not able to trim himself.   Objective: AAO x3, NAD DP/PT pulses palpable bilaterally, CRT less than 3 seconds Callus on the right medial forefoot without any underlying ulceration drainage or signs of infection. Nails are hypertrophic, dystrophic, brittle, discolored, elongated 5. No surrounding redness or drainage. Tenderness nails 1-5 on the left. No open lesions or pre-ulcerative lesions are identified today. Hammertoes left  Right TMA No pain with calf compression, swelling, warmth, erythema  Assessment: 67 year old male with callus right foot, symptomatic onychomycosis left foot  Plan: -All treatment options discussed with the patient including all alternatives, risks, complications.  -Sharply debrided the callus on the right foot without any complications or bleeding.  Discussed moisturizer, offloading.  Discussed stretching exercises for the Achilles tendon to hopefully take pressure off of the callus. -She will be debrided nails x 5 without any complications or bleeding. -Patient encouraged to call the office with any questions, concerns, change in symptoms.   Return in about 3 months (around 09/01/2022).  Vivi Barrack DPM

## 2022-06-20 ENCOUNTER — Ambulatory Visit (INDEPENDENT_AMBULATORY_CARE_PROVIDER_SITE_OTHER): Payer: Medicare HMO | Admitting: Family Medicine

## 2022-06-20 ENCOUNTER — Encounter: Payer: Self-pay | Admitting: Family Medicine

## 2022-06-20 VITALS — BP 124/64 | HR 72 | Temp 98.5°F | Ht 71.0 in | Wt 227.0 lb

## 2022-06-20 DIAGNOSIS — I509 Heart failure, unspecified: Secondary | ICD-10-CM | POA: Diagnosis not present

## 2022-06-20 DIAGNOSIS — M25561 Pain in right knee: Secondary | ICD-10-CM

## 2022-06-20 DIAGNOSIS — Z89411 Acquired absence of right great toe: Secondary | ICD-10-CM | POA: Diagnosis not present

## 2022-06-20 DIAGNOSIS — M069 Rheumatoid arthritis, unspecified: Secondary | ICD-10-CM | POA: Diagnosis not present

## 2022-06-20 DIAGNOSIS — Z89421 Acquired absence of other right toe(s): Secondary | ICD-10-CM | POA: Diagnosis not present

## 2022-06-20 NOTE — Progress Notes (Signed)
Subjective:    Patient ID: Aaron Fox, male    DOB: [birth year], 67 y.o.   MRN: [id number]  Patient reports bilateral knee pain right greater than left.  The pain seems to be worse over the medial compartment bilaterally.  He reports crepitus bilaterally.  There is no laxity to varus or valgus stress.  He has negative anterior posterior drawer sign bilaterally.  He denies any locking or catching.  However he does report some stiffness and fluid in his knee. 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 Vonna Kotyk   CATARACT EXTRACTION, BILATERAL     COLONOSCOPY  2004   LYMPH NODE BIOPSY Right 10/16/2014   Procedure: RIGHT INGUINAL LYMPH NODE BIOPSY;  Surgeon: Franky Macho, MD;  Location: [facility];  Service: General;  Laterality: Right;   Current Outpatient Medications on File Prior to Visit  Medication Sig Dispense Refill   ACCU-CHEK  GUIDE test strip TEST BLOOD SUGAR 7 TIMES DAILY 600 strip 3   Accu-Chek Softclix Lancets lancets TEST  7  TIMES  DAILY 600 each 3   Alcohol Swabs (B-D SINGLE USE SWABS REGULAR) PADS Use to cleanse site prior to use of lancet to obtain droplet of blood two times per day; E10.49 200 each 2   aspirin 81 MG tablet Take 81 mg by mouth daily.     atorvastatin (LIPITOR) 10 MG tablet TAKE 1 TABLET EVERY DAY 90 tablet 0   Blood Glucose Monitoring Suppl (ACCU-CHEK GUIDE ME) w/Device KIT 1 each by Does not apply route See admin instructions. Use to monitor glucose levels 7 times daily; E11.9; WILL NOT COMPLETE PA 1 kit 0   gabapentin (NEURONTIN) 300 MG capsule TAKE 2 CAPSULES THREE TIMES DAILY 540 capsule 2   HYDROcodone bit-homatropine (HYCODAN) 5-1.5 MG/5ML syrup Take 5 mLs by mouth every 8 (eight) hours as needed for cough. 120 mL 0   insulin isophane & regular human KwikPen (NOVOLIN 70/30 KWIKPEN) (70-30) 100 UNIT/ML KwikPen Inject 10 Units into the skin 2 (two) times daily before a meal. 15 mL 1   INSULIN SYRINGE 1CC/29G (DROPLET INSULIN SYRINGE) 29G X 1/2" 1 ML MISC Use to inject in morning and bedtime 200 each 1   losartan (COZAAR) 50 MG tablet TAKE 1 TABLET EVERY DAY 90 tablet 10   meloxicam (MOBIC) 15 MG tablet Take  1 tablet (15 mg total) by mouth daily. 30 tablet 0   mupirocin ointment (BACTROBAN) 2 % Apply 1 application topically 2 (two) times daily. 30 g 2   Tetrahydrozoline HCl (VISINE OP) Place 1 drop into both eyes daily as needed (redness).     triamcinolone cream (KENALOG) 0.1 % APPLY TO THE AFFECTED AREA(S) TOPICALLY THREE TIMES DAILY AS NEEDED FOR RASH 45 g 2   No current facility-administered medications on file prior to visit.   Allergies  Allergen Reactions   Penicillins Swelling    Swelling of the hands and feet, skin peeling Did it involve swelling of the face/tongue/throat, SOB, or low BP? No Did it involve sudden or severe rash/hives, skin peeling, or any reaction on the inside  of your mouth or nose? No Did you need to seek medical attention at a hospital or doctor's office? Yes When did it last happen?      10 + years If all above answers are "NO", may proceed with cephalosporin use.    Ciprofloxacin Hives and Rash   Terbinafine Hcl Hives   Zestril [Lisinopril] Cough   Social History   Socioeconomic History   Marital status: Married    Spouse name: Not on file   Number of children: 3   Years of education: Not on file   Highest education level: Not on file  Occupational History   Occupation: Disability    Employer: [employer]  Tobacco Use   Smoking status: Former    Types: Cigarettes    Quit date: 11/02/1999    Years since quitting: 22.6   Smokeless tobacco: Never  Vaping Use   Vaping Use: Never used  Substance and Sexual Activity   Alcohol use: No    Alcohol/week: 0.0 standard drinks of alcohol   Drug use: No   Sexual activity: Not on file  Other Topics Concern   Not on file  Social History Narrative   ** Merged History Encounter ** Married, 3 kids   Prior work [employer] before disability   Regular exercise-yes   Works some doing Air traffic controller Determinants of Corporate investment banker Strain: Low Risk  (02/07/2022)   Overall Financial Resource Strain (CARDIA)    Difficulty of Paying Living Expenses: Not hard at all  Food Insecurity: No Food Insecurity (02/07/2022)   Hunger Vital Sign    Worried About Running Out of Food in the Last Year: Never true    Ran Out of Food in the Last Year: Never true  Transportation Needs: No Transportation Needs (02/07/2022)   PRAPARE - Administrator, Civil Service (Medical): No    Lack of Transportation (Non-Medical): No  Physical Activity: Sufficiently Active (02/07/2022)   Exercise Vital Sign    Days of Exercise per Week: 7 days    Minutes of Exercise per Session: 60 min  Stress: No Stress Concern Present (02/07/2022)   [employer] of Occupational Health - Occupational  Stress Questionnaire    Feeling of Stress : Not at all  Social Connections: Moderately Integrated (08/27/2020)   Social Connection and Isolation Panel [NHANES]    Frequency of Communication with Friends and Family: More than three times a week    Frequency of Social Gatherings with Friends and Family: Three times a week    Attends Religious Services: More than 4 times per year    Active Member of Clubs or Organizations: No    Attends Banker Meetings: Never  Marital Status: Married  Catering manager Violence: Not At Risk (02/07/2022)   Humiliation, Afraid, Rape, and Kick questionnaire    Fear of Current or Ex-Partner: No    Emotionally Abused: No    Physically Abused: No    Sexually Abused: No     Review of Systems  All other systems reviewed and are negative.      Objective:   Physical Exam Vitals reviewed.  Constitutional:      General: He is not in acute distress.    Appearance: Normal appearance. He is normal weight. He is not ill-appearing, toxic-appearing or diaphoretic.  Cardiovascular:     Rate and Rhythm: Normal rate and regular rhythm.     Heart sounds: Normal heart sounds. No murmur heard. Pulmonary:     Effort: Pulmonary effort is normal. No respiratory distress.     Breath sounds: Normal breath sounds. No stridor. No wheezing, rhonchi or rales.  Musculoskeletal:     Right knee: Decreased range of motion. Tenderness present over the medial joint line.     Left knee: Decreased range of motion. Tenderness present over the medial joint line.           Assessment & Plan:  Acute pain of right knee - Plan: DG Knee Complete 4 Views Right Patient has been complaining of bilateral knee pain.  I suspect osteoarthritis.  We discussed a trial of NSAIDs versus a cortisone injection.  The patient states the pain is so bad he would like to try cortisone injection in his right knee.  Using sterile technique, I injected the right knee with 2 cc lidocaine, 2 cc  Marcaine, and 2 cc of 40 mg/mL Kenalog.  Patient tolerated the procedure well without complication.  I did recommend that he get an x-ray of his right knee to determine the severity of the arthritis.

## 2022-06-22 ENCOUNTER — Ambulatory Visit (HOSPITAL_COMMUNITY)
Admission: RE | Admit: 2022-06-22 | Discharge: 2022-06-22 | Disposition: A | Discharge status: Home / Self Care | Payer: Medicare HMO | Source: Ambulatory Visit | Attending: Family Medicine | Admitting: Family Medicine

## 2022-06-22 DIAGNOSIS — M25561 Pain in right knee: Secondary | ICD-10-CM | POA: Diagnosis not present

## 2022-07-14 ENCOUNTER — Ambulatory Visit (INDEPENDENT_AMBULATORY_CARE_PROVIDER_SITE_OTHER): Payer: Medicare HMO | Admitting: Family Medicine

## 2022-07-14 ENCOUNTER — Encounter: Payer: Self-pay | Admitting: Family Medicine

## 2022-07-14 VITALS — BP 122/62 | HR 63 | Temp 97.7°F | Ht 71.0 in | Wt 223.0 lb

## 2022-07-14 DIAGNOSIS — G8929 Other chronic pain: Secondary | ICD-10-CM

## 2022-07-14 DIAGNOSIS — M25562 Pain in left knee: Secondary | ICD-10-CM

## 2022-07-14 NOTE — Progress Notes (Signed)
Subjective:    Patient ID: Aaron Fox, male    DOB: May 29, 1955, 67 y.o.   MRN: [id number] I recently performed a cortisone shot in his right knee.  The patient saw substantial benefit from the cortisone shot in his right knee.  He is here today requesting a cortisone shot in his left knee.  He reports anterior knee pain on a daily basis.  It is located in the medial and lateral joint lines.  He states that when he rises from a seated position he can feel a stabbing pain in his knee around his kneecap.  It hurts to squat.  It hurts to walk.  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 Vonna Kotyk   CATARACT EXTRACTION, BILATERAL     COLONOSCOPY  2004   LYMPH NODE BIOPSY Right 10/16/2014   Procedure: RIGHT INGUINAL LYMPH NODE BIOPSY;  Surgeon: Franky Macho, MD;  Location: [facility];  Service:  General;  Laterality: Right;   Current Outpatient Medications on File Prior to Visit  Medication Sig Dispense Refill   ACCU-CHEK GUIDE test strip TEST BLOOD SUGAR 7 TIMES DAILY 600 strip 3   Accu-Chek Softclix Lancets lancets TEST  7  TIMES  DAILY 600 each 3   Alcohol Swabs (B-D SINGLE USE SWABS REGULAR) PADS Use to cleanse site prior to use of lancet to obtain droplet of blood two times per day; E10.49 200 each 2   aspirin 81 MG tablet Take 81 mg by mouth daily.     atorvastatin (LIPITOR) 10 MG tablet TAKE 1 TABLET EVERY DAY 90 tablet 0   Blood Glucose Monitoring Suppl (ACCU-CHEK GUIDE ME) w/Device KIT 1 each by Does not apply route See admin instructions. Use to monitor glucose levels 7 times daily; E11.9; WILL NOT COMPLETE PA 1 kit 0   gabapentin (NEURONTIN) 300 MG capsule TAKE 2 CAPSULES THREE TIMES DAILY 540 capsule 2   HYDROcodone bit-homatropine (HYCODAN) 5-1.5 MG/5ML syrup Take 5 mLs by mouth every 8 (eight) hours as needed for cough. 120 mL 0   insulin isophane & regular human KwikPen (NOVOLIN 70/30 KWIKPEN) (70-30) 100 UNIT/ML KwikPen Inject 10 Units into the skin 2 (two) times daily before a meal. 15 mL 1   INSULIN SYRINGE 1CC/29G (DROPLET INSULIN SYRINGE) 29G X 1/2" 1  ML MISC Use to inject in morning and bedtime 200 each 1   losartan (COZAAR) 50 MG tablet TAKE 1 TABLET EVERY DAY 90 tablet 10   meloxicam (MOBIC) 15 MG tablet Take 1 tablet (15 mg total) by mouth daily. 30 tablet 0   mupirocin ointment (BACTROBAN) 2 % Apply 1 application topically 2 (two) times daily. 30 g 2   Tetrahydrozoline HCl (VISINE OP) Place 1 drop into both eyes daily as needed (redness).     triamcinolone cream (KENALOG) 0.1 % APPLY TO THE AFFECTED AREA(S) TOPICALLY THREE TIMES DAILY AS NEEDED FOR RASH 45 g 2   No current facility-administered medications on file prior to visit.   Allergies  Allergen Reactions   Penicillins Swelling    Swelling of the hands and feet, skin peeling Did it involve swelling of  the face/tongue/throat, SOB, or low BP? No Did it involve sudden or severe rash/hives, skin peeling, or any reaction on the inside of your mouth or nose? No Did you need to seek medical attention at a hospital or doctor's office? Yes When did it last happen?      10 + years If all above answers are "NO", may proceed with cephalosporin use.    Ciprofloxacin Hives and Rash   Terbinafine Hcl Hives   Zestril [Lisinopril] Cough   Social History   Socioeconomic History   Marital status: Married    Spouse name: Not on file   Number of children: 3   Years of education: Not on file   Highest education level: Not on file  Occupational History   Occupation: Disability    Employer: [employer]  Tobacco Use   Smoking status: Former    Types: Cigarettes    Quit date: 11/02/1999    Years since quitting: 22.7   Smokeless tobacco: Never  Vaping Use   Vaping Use: Never used  Substance and Sexual Activity   Alcohol use: No    Alcohol/week: 0.0 standard drinks of alcohol   Drug use: No   Sexual activity: Not on file  Other Topics Concern   Not on file  Social History Narrative   ** Merged History Encounter ** Married, 3 kids   Prior work [employer] before disability   Regular exercise-yes   Works some doing Air traffic controller Determinants of Corporate investment banker Strain: Low Risk  (02/07/2022)   Overall Financial Resource Strain (CARDIA)    Difficulty of Paying Living Expenses: Not hard at all  Food Insecurity: No Food Insecurity (02/07/2022)   Hunger Vital Sign    Worried About Running Out of Food in the Last Year: Never true    Ran Out of Food in the Last Year: Never true  Transportation Needs: No Transportation Needs (02/07/2022)   PRAPARE - Administrator, Civil Service (Medical): No    Lack of Transportation (Non-Medical): No  Physical Activity: Sufficiently Active (02/07/2022)   Exercise Vital Sign    Days of Exercise per Week: 7 days    Minutes of Exercise  per Session: 60 min  Stress: No Stress Concern Present (02/07/2022)   [employer] of Occupational Health - Occupational Stress Questionnaire    Feeling of Stress : Not at all  Social Connections: Moderately Integrated (08/27/2020)   Social Connection and Isolation Panel [NHANES]    Frequency of Communication with Friends and Family: More than three times a week    Frequency of Social Gatherings with Friends and Family: Three times  a week    Attends Religious Services: More than 4 times per year    Active Member of Clubs or Organizations: No    Attends Banker Meetings: Never    Marital Status: Married  Catering manager Violence: Not At Risk (02/07/2022)   Humiliation, Afraid, Rape, and Kick questionnaire    Fear of Current or Ex-Partner: No    Emotionally Abused: No    Physically Abused: No    Sexually Abused: No     Review of Systems  All other systems reviewed and are negative.      Objective:   Physical Exam Vitals reviewed.  Constitutional:      General: He is not in acute distress.    Appearance: Normal appearance. He is normal weight. He is not ill-appearing, toxic-appearing or diaphoretic.  Cardiovascular:     Rate and Rhythm: Normal rate and regular rhythm.     Heart sounds: Normal heart sounds. No murmur heard. Pulmonary:     Effort: Pulmonary effort is normal. No respiratory distress.     Breath sounds: Normal breath sounds. No stridor. No wheezing, rhonchi or rales.  Musculoskeletal:     Left knee: Decreased range of motion. Tenderness present over the medial joint line and lateral joint line.           Assessment & Plan:  Chronic pain of left knee   I suspect osteoarthritis.  Patient saw substantial benefit from his previous cortisone injection.  He would like to repeat cortisone injection in his left knee.  Using sterile technique, I injected the left knee with 2 cc lidocaine, 2 cc Marcaine, and 2 cc of 40 mg/mL Kenalog.  Patient  tolerated the procedure well without complication.

## 2022-07-16 ENCOUNTER — Other Ambulatory Visit: Payer: Self-pay | Admitting: Family Medicine

## 2022-07-17 NOTE — Telephone Encounter (Signed)
Requested medication (s) are due for refill today - yes  Requested medication (s) are on the active medication list -yes  Future visit scheduled -no  Last refill: 09/26/21 45g 2RF  Notes to clinic: non delegated Rx  Requested Prescriptions  Pending Prescriptions Disp Refills   triamcinolone cream (KENALOG) 0.1 % [Pharmacy Med Name: TRIAMCINOLONE ACETONIDE 0.1 % Cream] 45 g 3    Sig: APPLY TO THE AFFECTED AREA(S) TOPICALLY THREE TIMES DAILY AS NEEDED FOR RASH     Not Delegated - Dermatology:  Corticosteroids Failed - 07/16/2022  1:48 AM      Failed - This refill cannot be delegated      Failed - Valid encounter within last 12 months    Recent Outpatient Visits           1 year ago Pleurisy   [facility] Donita Brooks, MD   2 years ago Uncontrolled type 2 diabetes mellitus with skin complication, with long-term current use of insulin (HCC)   [facility] Pickard, Priscille Heidelberg, MD   3 years ago Uncontrolled type 2 diabetes mellitus with skin complication, with long-term current use of insulin (HCC)   [facility] Pickard, Priscille Heidelberg, MD   3 years ago Uncontrolled type 2 diabetes mellitus with skin complication, with long-term current use of insulin (HCC)   [facility] Pickard, Priscille Heidelberg, MD   3 years ago Establishing care with new doctor, encounter for   [facility] Donita Brooks, MD                 Requested Prescriptions  Pending Prescriptions Disp Refills   triamcinolone cream (KENALOG) 0.1 % [Pharmacy Med Name: TRIAMCINOLONE ACETONIDE 0.1 % Cream] 45 g 3    Sig: APPLY TO THE AFFECTED AREA(S) TOPICALLY THREE TIMES DAILY AS NEEDED FOR RASH     Not Delegated - Dermatology:  Corticosteroids Failed - 07/16/2022  1:48 AM      Failed - This refill cannot be delegated      Failed - Valid encounter within last 12 months    Recent Outpatient Visits           1 year ago Pleurisy   [facility] Donita Brooks, MD   2 years ago Uncontrolled type 2 diabetes mellitus with skin complication, with long-term current use of insulin (HCC)   [facility] Pickard, Priscille Heidelberg, MD   3 years ago Uncontrolled type 2 diabetes mellitus with skin complication, with long-term current use of insulin (HCC)   [facility] Donita Brooks, MD   3 years ago Uncontrolled type 2 diabetes mellitus with skin complication, with long-term current use of insulin (HCC)   [facility] Pickard, Priscille Heidelberg, MD   3 years ago Establishing care with new doctor, encounter for   [facility] Pickard, Priscille Heidelberg, MD

## 2022-08-03 ENCOUNTER — Ambulatory Visit: Payer: Medicare HMO | Admitting: Pharmacist

## 2022-08-03 ENCOUNTER — Telehealth: Payer: Self-pay | Admitting: Pharmacist

## 2022-08-03 NOTE — Progress Notes (Signed)
Care Management & Coordination Services Pharmacy Note  08/03/2022 Name:  Aaron Fox MRN:  [id number] DOB:  04/26/1955  Summary: PharmD FU visit.  Stopped Trulicity due to abdominal pain.  Sugars continue to fluctuate and A1c has increased to 7.8 at endocrine visit.  Reviewed medication history and DM history - still unclear from chart if he has Type I or Type II DM.  Last time urine microalbumin was checked was slightly elevated, he is due for recheck.  Also history of CHF.  Recommendations/Changes made from today's visit: Consult endo to see thoughts on starting Farxiga 10mg  for DM, CHF, albuminuria  Follow up plan: FU 3 months CMA to assess glucose next month   Subjective: Aaron Fox is an 67 y.o. year old male who is a primary patient of Pickard, Priscille Heidelberg, MD.  The care coordination team was consulted for assistance with disease management and care coordination needs.    Engaged with patient by telephone for follow up visit.  Recent office visits:  None since 12/13/20   Recent consult visits:  12/24/20 Podiatry Quinn Axe, DPM. For foot ulcer. No medication changes. 12/17/20 Endo Dani Gobble, NP. For follow-up. STOPPED Doxycyline.    Hospital visits:  None since 12/13/20   Objective:  Lab Results  Component Value Date   CREATININE 1.20 04/20/2022   BUN 11 03/06/2022   GFR 79.79 10/15/2017   EGFR 81 03/06/2022   GFRNONAA >60 08/27/2021   GFRAA 89 05/07/2020   NA 141 03/06/2022   K 4.6 03/06/2022   CALCIUM 9.5 03/06/2022   CO2 25 03/06/2022   GLUCOSE 140 (H) 03/06/2022    Lab Results  Component Value Date/Time   HGBA1C 7.8 (A) 06/02/2022 08:42 AM   HGBA1C 7.5 (A) 01/27/2022 08:32 AM   HGBA1C 7.4 (A) 04/22/2021 08:36 AM   HGBA1C 7.5 (A) 12/17/2020 08:42 AM   GFR 79.79 10/15/2017 08:18 AM   GFR 85.11 04/28/2016 08:04 AM   MICROALBUR 150 10/20/2021 08:50 AM   MICROALBUR 7.9 05/07/2020 08:16 AM   MICROALBUR 17.5 (H) 10/15/2017  08:18 AM    Last diabetic Eye exam:  Lab Results  Component Value Date/Time   HMDIABEYEEXA Retinopathy (A) 03/21/2019 11:16 AM    Last diabetic Foot exam:  Lab Results  Component Value Date/Time   HMDIABFOOTEX yes 06/15/2008 12:00 AM     Lab Results  Component Value Date   CHOL 154 04/15/2021   HDL 48 04/15/2021   LDLCALC 89 04/15/2021   LDLDIRECT 120.2 04/04/2013   TRIG 89 04/15/2021   CHOLHDL 3.2 04/15/2021       Latest Ref Rng & Units 03/06/2022    4:28 PM 04/15/2021    8:26 AM 12/10/2020    8:47 AM  Hepatic Function  Total Protein 6.1 - 8.1 g/dL 7.9  7.2  7.6   Albumin 3.8 - 4.8 g/dL  4.2  4.4   AST 10 - 35 U/L 21  52  52   ALT 9 - 46 U/L 14  37  50   Alk Phosphatase 44 - 121 IU/L  116  110   Total Bilirubin 0.2 - 1.2 mg/dL 0.4  0.5  0.4     Lab Results  Component Value Date/Time   TSH 1.490 04/15/2021 08:26 AM   TSH 1.10 10/15/2017 08:18 AM   FREET4 1.32 04/15/2021 08:26 AM       Latest Ref Rng & Units 03/06/2022    4:28 PM 08/27/2021   10:08 AM 05/07/2020  8:16 AM  CBC  WBC 3.8 - 10.8 Thousand/uL 4.7  4.4  3.5   Hemoglobin 13.2 - 17.1 g/dL 14.7  82.9  56.2   Hematocrit 38.5 - 50.0 % 35.7  35.6  38.5   Platelets 140 - 400 Thousand/uL 168  186  240     Lab Results  Component Value Date/Time   VITAMINB12 313 04/04/2013 09:18 AM   VITAMINB12 283 11/13/2011 08:59 AM    Clinical ASCVD: No  The 10-year ASCVD risk score (Arnett DK, et al., 2019) is: 40.1%   Values used to calculate the score:     Age: 67 years     Sex: Male     Is Non-Hispanic African American: Yes     Diabetic: Yes     Tobacco smoker: Yes     Systolic Blood Pressure: 122 mmHg     Is BP treated: Yes     HDL Cholesterol: 48 mg/dL     Total Cholesterol: 154 mg/dL        13/09/6576   46:96 AM 01/27/2022    1:29 PM 01/27/2022    1:27 PM  Depression screen PHQ 2/9  Decreased Interest 0 0 0  Down, Depressed, Hopeless 0 0 0  PHQ - 2 Score 0 0 0     Social History   Tobacco Use   Smoking Status Former   Types: Cigarettes   Quit date: 11/02/1999   Years since quitting: 22.7  Smokeless Tobacco Never   BP Readings from Last 3 Encounters:  07/14/22 122/62  06/20/22 124/64  06/02/22 130/71   Pulse Readings from Last 3 Encounters:  07/14/22 63  06/20/22 72  06/02/22 (!) 56   Wt Readings from Last 3 Encounters:  07/14/22 223 lb (101.2 kg)  06/20/22 227 lb (103 kg)  06/02/22 223 lb (101.2 kg)   BMI Readings from Last 3 Encounters:  07/14/22 31.10 kg/m  06/20/22 31.66 kg/m  06/02/22 31.10 kg/m    Allergies  Allergen Reactions   Penicillins Swelling    Swelling of the hands and feet, skin peeling Did it involve swelling of the face/tongue/throat, SOB, or low BP? No Did it involve sudden or severe rash/hives, skin peeling, or any reaction on the inside of your mouth or nose? No Did you need to seek medical attention at a hospital or doctor's office? Yes When did it last happen?      10 + years If all above answers are "NO", may proceed with cephalosporin use.    Ciprofloxacin Hives and Rash   Terbinafine Hcl Hives   Zestril [Lisinopril] Cough    Medications Reviewed Today     Reviewed by Erroll Luna, [facility] (Pharmacist) on 08/03/22 at 1506  Med List Status: <None>   Medication Order Taking? Sig Documenting Provider Last Dose Status Informant  ACCU-CHEK GUIDE test strip [id number] No TEST BLOOD SUGAR 7 TIMES DAILY Donita Brooks, MD Taking Active   Accu-Chek Softclix Lancets lancets [id number] No TEST  7  TIMES  DAILY Carlus Pavlov, MD Taking Active   Alcohol Swabs (B-D SINGLE USE SWABS REGULAR) PADS [id number] No Use to cleanse site prior to use of lancet to obtain droplet of blood two times per day; E10.49 Romero Belling, MD Taking Active Self  aspirin 81 MG tablet [id number] No Take 81 mg by mouth daily. [provider] Taking Active Self  atorvastatin (LIPITOR) 10 MG tablet [id number] No TAKE 1 TABLET EVERY DAY Pickard, Priscille Heidelberg, MD  Taking Active  Blood Glucose Monitoring Suppl (ACCU-CHEK GUIDE ME) w/Device KIT [id number] No 1 each by Does not apply route See admin instructions. Use to monitor glucose levels 7 times daily; E11.9; WILL NOT COMPLETE PA Donita Brooks, MD Taking Active   gabapentin (NEURONTIN) 300 MG capsule [id number] No TAKE 2 CAPSULES THREE TIMES DAILY Donita Brooks, MD Taking Active   HYDROcodone bit-homatropine (HYCODAN) 5-1.5 MG/5ML syrup [id number] No Take 5 mLs by mouth every 8 (eight) hours as needed for cough. Donita Brooks, MD Taking Active   insulin isophane & regular human KwikPen (NOVOLIN 70/30 KWIKPEN) (70-30) 100 UNIT/ML KwikPen [id number] No Inject 10 Units into the skin 2 (two) times daily before a meal. Reardon, Freddi Starr, NP Taking Active   INSULIN SYRINGE 1CC/29G (DROPLET INSULIN SYRINGE) 29G X 1/2" 1 ML MISC [id number] No Use to inject in morning and bedtime Shamleffer, Konrad Dolores, MD Taking Active   losartan (COZAAR) 50 MG tablet [id number] No TAKE 1 TABLET EVERY DAY Donita Brooks, MD Taking Active   meloxicam (MOBIC) 15 MG tablet [id number] No Take 1 tablet (15 mg total) by mouth daily. Donita Brooks, MD Taking Active   mupirocin ointment (BACTROBAN) 2 % [id number] No Apply 1 application topically 2 (two) times daily. Vivi Barrack, DPM Taking Active   Tetrahydrozoline HCl (VISINE OP) [id number] No Place 1 drop into both eyes daily as needed (redness). [provider] Taking Active Self  triamcinolone cream (KENALOG) 0.1 % [id number]  APPLY TO THE AFFECTED AREA(S) TOPICALLY THREE TIMES DAILY AS NEEDED FOR Wilder Glade, Binnie Rail, FNP  Active             SDOH:  (Social Determinants of Health) assessments and interventions performed: No, done within the year Financial Resource Strain: Low Risk  (02/07/2022)   Overall Financial Resource Strain (CARDIA)    Difficulty of Paying Living Expenses: Not hard at all   Food Insecurity: No Food Insecurity (02/07/2022)    Hunger Vital Sign    Worried About Running Out of Food in the Last Year: Never true    Ran Out of Food in the Last Year: Never true    SDOH Interventions    Flowsheet Row Clinical Support from 02/07/2022 in [facility] Telephone from 01/27/2022 in [facility] Coordination Clinical Support from 08/27/2020 in [facility]  SDOH Interventions     Food Insecurity Interventions Intervention Not Indicated -- Intervention Not Indicated  Housing Interventions Intervention Not Indicated Intervention Not Indicated Intervention Not Indicated  Transportation Interventions Intervention Not Indicated Intervention Not Indicated Intervention Not Indicated  Utilities Interventions Intervention Not Indicated Intervention Not Indicated --  Alcohol Usage Interventions Intervention Not Indicated (Score <7) -- --  Financial Strain Interventions Intervention Not Indicated Intervention Not Indicated Intervention Not Indicated  Physical Activity Interventions Intervention Not Indicated Intervention Not Indicated Intervention Not Indicated  Stress Interventions Intervention Not Indicated -- Intervention Not Indicated  Social Connections Interventions -- -- Intervention Not Indicated       Medication Assistance: None required.  Patient affirms current coverage meets needs.  Medication Access: Within the past 30 days, how often has patient missed a dose of medication? 0 Is a pillbox or other method used to improve adherence? Yes  Factors that may affect medication adherence? adverse effects of medications Are meds synced by current pharmacy? No  Are meds delivered by current pharmacy? No  Does patient experience delays in picking up medications due to transportation concerns?  No   [facility] Services Reviewed: Is patient disadvantaged to use [facility]?: Yes  Current Rx insurance plan: [employer] Name and location of Current pharmacy:   [facility] [address] [place] - [address] [place] [address] [place] [address] Phone: [phone] Fax: [phone]  [facility] - [place], [place] - [address] [place] [address] Phone: [phone] Fax: [phone]  [facility] services reviewed with patient today?: Yes  Patient requests to transfer care to [facility]?: No  Reason patient declined to change pharmacies: Disadvantaged due to insurance/mail order  Compliance/Adherence/Medication fill history: Annual wellness visit in last year? Yes   If Diabetic: Last eye exam / retinopathy screening: 08/05/2021 Last diabetic foot exam: 04/09/2020  STAR MEDS: Losartan 50mg  07/29/22 90ds Atorvastatin 10mg  06/02/22 90ds  Assessment/Plan     Hypertension (BP goal <130/80) 10/06/21 -Controlled, not assessed -Current treatment: Losartan 50mg  daily Appropriate, Effective, Safe, Accessible -Medications previously tried: none noted  -Current home readings: does not have meter at home -Current dietary habits: same -Current exercise habits: still helping mow lawns -Educated on BP goals and benefits of medications for prevention of heart attack, stroke and kidney damage; Daily salt intake goal < 2300 mg; Exercise goal of 150 minutes per week; Importance of home blood pressure monitoring; -BP controlled most recently in office.  No need for changes at this time.   Update 10/07/20 Not checking BP at home Office BP's remain controlled - denies any dizziness or HA's at home. 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 Does not check BP at home. Encouraged him to remain active. Last BP in office was elevated, otherwise has been controlled. Continue to monitor for now. No changes in meds needed.  Hyperlipidemia: (LDL goal < 70) 04/28/22 -Uncontrolled at this time -  last LDL is 89 -Current treatment: Atorvastatin 10mg  Appropriate, Query effective, ,  -Medications previously tried: Simvastatin (efficacy?)  -Current dietary patterns: see above -Current exercise habits: see above -Educated on Cholesterol goals;  Benefits of statin for ASCVD risk reduction; Importance of limiting foods high in cholesterol;  -Due for updated lipid panel - LDL goal for patient with DM is < 70.  Recommend recheck lipid panel at next OV.  If elevated again, would recommend increasing atorvastatin to 20mg .  No changes at this time.  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 Uncontrolled, most recent A1c in 7.8% -Current medications: Novolin 70/30 10 units BID Appropriate, Query effective, ,   -Medications previously tried: Novolin R, trulicity -Current home glucose readings Glucose has been fluctuating per patient report, no specific logs provided today -Denies hypoglycemic/hyperglycemic symptoms -Current meal patterns: same see previous -Current exercise: same see previous -Educated on A1c and blood sugar goals; Complications of diabetes including kidney damage, retinal damage, and cardiovascular disease; Exercise goal of 150 minutes per week; Prevention and management of hypoglycemic episodes; Benefits of routine self-monitoring of blood sugar;  -Officially stopped Trulicity due to cramping in stomach.  He has had a few episodes of hypoglycemia where he does not eat for a while.  He knows this and is aware he needs to eat consistent meals.  A1c still elevated.  I question if he would benefit from SGLT-2.  Does have history of CHF as well as microalbuminuria last time his kidney labs were done.   Will consult and defer this decision to endocrinology since this  is managed by them. Continue current labs for now - consult with endo.  Update 10/07/20 Changed to Novoling 70/30 10 units bid A1c improved to 7.0! Fasting Sugars - 90-150s He is now back to watching  what he eats and sugars are much more controlled. Congratulated on efforts and lifestyle mods. Continue current meds for now.  Update 02/10/21 Fasting Sugars - 126 this morning,  Continues to lay off the sweets Remains on 10 units BID of 70/30 insulin. Denies any hypoglycemia. Currently he states his biggest concern is that his portion sizes are too large. Suggested he use a smaller plate as this sometimes helps you decrease portion size. Last POC A1c was 7.5 which increased from 7.0. Encouraged him to work on on diet/exercise.   He is motivated to get on track and is agreeable to plan.             Willa Frater, PharmD, CPP Clinical Pharmacist Practitioner [facility] [phone]

## 2022-08-03 NOTE — Progress Notes (Signed)
Care Management & Coordination Services Pharmacy Team  Reason for Encounter: Appointment Reminder  Contacted patient to confirm telephone appointment with Erskine Emery, PharmD on 08/03/2022 at 2:45 pm. Unsuccessful outreach. Left voicemail with appointment details.   Star Rating Drugs:  Atorvastatin 10 mg last filled 06/02/2022 90 DS Losartan 50 mg last filled 07/29/2022 90 DS   Care Gaps: Annual wellness visit in last year? Yes  If Diabetic: Last eye exam / retinopathy screening: 08/05/2021 Last diabetic foot exam: 04/09/2020   Future Appointments  Date Time Provider Department Center  08/03/2022  2:45 PM Erroll Luna, [facility] None  09/08/2022  7:30 AM Vivi Barrack, DPM [facility]  10/06/2022  8:00 AM Dani Gobble, NP [facility] None  02/13/2023 10:00 AM [facility]LL[facility] VISIT [facility]   April D Calhoun, [facility] Clinical Pharmacist Assistant [phone]

## 2022-08-09 ENCOUNTER — Other Ambulatory Visit: Payer: Self-pay | Admitting: Internal Medicine

## 2022-08-30 ENCOUNTER — Other Ambulatory Visit: Payer: Self-pay | Admitting: Internal Medicine

## 2022-08-30 ENCOUNTER — Other Ambulatory Visit: Payer: Self-pay | Admitting: Family Medicine

## 2022-08-30 DIAGNOSIS — E11319 Type 2 diabetes mellitus with unspecified diabetic retinopathy without macular edema: Secondary | ICD-10-CM

## 2022-09-08 ENCOUNTER — Ambulatory Visit: Payer: Medicare HMO | Admitting: Podiatry

## 2022-09-22 ENCOUNTER — Telehealth: Payer: Self-pay | Admitting: Podiatry

## 2022-09-22 ENCOUNTER — Other Ambulatory Visit: Payer: Self-pay | Admitting: Podiatry

## 2022-09-22 MED ORDER — DOXYCYCLINE HYCLATE 100 MG PO TABS: 100.0000 mg | ORAL_TABLET | Freq: Two times a day (BID) | ORAL | 0 refills | Status: AC

## 2022-09-22 NOTE — Progress Notes (Signed)
Abx sent

## 2022-09-22 NOTE — Telephone Encounter (Signed)
Pt sees Dr. Ardelle Anton, stated that the bottom of is foot is sore wanted a Rx for antibiotics. He thinks an infection may be developing. Please advise

## 2022-09-29 ENCOUNTER — Ambulatory Visit (INDEPENDENT_AMBULATORY_CARE_PROVIDER_SITE_OTHER): Payer: Medicare HMO

## 2022-09-29 ENCOUNTER — Other Ambulatory Visit: Payer: Self-pay | Admitting: Podiatry

## 2022-09-29 ENCOUNTER — Ambulatory Visit: Payer: Medicare HMO | Admitting: Podiatry

## 2022-09-29 DIAGNOSIS — T148XXA Other injury of unspecified body region, initial encounter: Secondary | ICD-10-CM

## 2022-09-29 DIAGNOSIS — B351 Tinea unguium: Secondary | ICD-10-CM

## 2022-09-29 DIAGNOSIS — E1149 Type 2 diabetes mellitus with other diabetic neurological complication: Secondary | ICD-10-CM

## 2022-09-29 DIAGNOSIS — M79671 Pain in right foot: Secondary | ICD-10-CM

## 2022-09-29 DIAGNOSIS — M79675 Pain in left toe(s): Secondary | ICD-10-CM

## 2022-09-29 DIAGNOSIS — L03115 Cellulitis of right lower limb: Secondary | ICD-10-CM

## 2022-09-29 NOTE — Addendum Note (Signed)
Addended by: Mindi Curling on: 09/29/2022 12:35 PM   Modules accepted: Orders

## 2022-09-29 NOTE — Addendum Note (Signed)
Addended by: Mindi Curling on: 09/29/2022 12:38 PM   Modules accepted: Orders

## 2022-09-29 NOTE — Progress Notes (Signed)
Subjective: Chief Complaint  Patient presents with   Nail Problem    Routine Foot Care-nail trim    Blister    Blister to right arch. Patient continues to take the antibiotics. Patient stated he is doing a lot better since the antibiotics.    67 year old male presents the office today with the above concerns.  He had a blister which she noticed last Friday.  He was started on doxycycline send seen has improved.  Is not sure how it started.  Does not report any fevers or chills.  He also has thick, long nails on his left foot which are causing discomfort.  No open lesions otherwise.  Last A1c was 7.8 on 06/02/2022  Objective: AAO x3, NAD DP/PT pulses palpable bilaterally, CRT less than 3 seconds Right transmetatarsal amputation noted.  Hyperkeratotic lesion but distal aspect and there is posterior, to the plantar aspect of the foot and there is bloody to clear drainage noted coming from the blister.  There is no malodor noted.  There is no crepitation. The toenails on the left foot are hypertrophic, dystrophic with yellow, brown discoloration.  No edema no erythema.  Some of the dried blood in the distal aspect of the left second toe without any ulcerations.  Hammertoes are present. No pain with calf compression, swelling, warmth, erythema    Assessment: 67 year old male blister right foot blister, symptomatic onychosis with hammertoes  Plan: -All treatment options discussed with the patient including all alternatives, risks, complications.  -X-rays obtained reviewed of the right foot.  3 views of the foot were obtained.  No evidence of acute fracture.  No soft tissue emphysema.  No cortical destruction suggest osteomyelitis at this time. -I cleaned the blister with alcohol and I did puncture this in order to remove the fluid present as well.  Recommended Betadine dressing changes daily.  Debrided the callus on the right foot any complications or bleeding.  No ulceration noted otherwise.  Finish course of antibiotics.  -Sharply debrided the nails on left foot x 5 without any complications or bleeding.  Offloading pads. -Daily foot inspection, glucose control -Monitor for any clinical signs or symptoms of infection and directed to call the office immediately should any occur or go to the ER. -Patient encouraged to call the office with any questions, concerns, change in symptoms.   Vivi Barrack DPM

## 2022-10-06 ENCOUNTER — Ambulatory Visit: Payer: Medicare HMO | Admitting: Nurse Practitioner

## 2022-10-06 ENCOUNTER — Encounter: Payer: Self-pay | Admitting: Nurse Practitioner

## 2022-10-06 VITALS — BP 120/68 | HR 62 | Ht 71.0 in | Wt 229.0 lb

## 2022-10-06 DIAGNOSIS — Z794 Long term (current) use of insulin: Secondary | ICD-10-CM | POA: Diagnosis not present

## 2022-10-06 DIAGNOSIS — E119 Type 2 diabetes mellitus without complications: Secondary | ICD-10-CM | POA: Diagnosis not present

## 2022-10-06 LAB — POCT GLYCOSYLATED HEMOGLOBIN (HGB A1C): Hemoglobin A1C: 7.3 % — AB (ref 4.0–5.6)

## 2022-10-06 MED ORDER — EMPAGLIFLOZIN 10 MG PO TABS: 10.0000 mg | ORAL_TABLET | Freq: Every day | ORAL | 1 refills | Status: DC

## 2022-10-06 NOTE — Progress Notes (Signed)
Endocrinology Follow Up Note       10/06/2022, 9:06 AM   Subjective:    Patient ID: Aaron Fox, male    DOB: [birth year].  Aaron Fox is being seen in follow up after being seen in consultation for management of currently uncontrolled symptomatic diabetes requested by  Donita Brooks, MD.   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 Vonna Kotyk   CATARACT EXTRACTION, BILATERAL     COLONOSCOPY  2004   LYMPH NODE BIOPSY Right 10/16/2014   Procedure: RIGHT INGUINAL LYMPH NODE BIOPSY;  Surgeon: Franky Macho, MD;  Location: [facility];  Service: General;  Laterality: Right;    Social History   Socioeconomic History   Marital status: Married    Spouse name: Not on file   Number of children: 3   Years of education: Not on file   Highest  education level: Not on file  Occupational History   Occupation: Disability    Employer: [employer]  Tobacco Use   Smoking status: Former    Current packs/day: 0.00    Types: Cigarettes    Quit date: 11/02/1999    Years since quitting: 22.9   Smokeless tobacco: Never  Vaping Use   Vaping status: Never Used  Substance and Sexual Activity   Alcohol use: No    Alcohol/week: 0.0 standard drinks of alcohol   Drug use: No   Sexual activity: Not on file  Other Topics Concern   Not on file  Social History Narrative   ** Merged History Encounter ** Married, 3 kids   Prior work [employer] before disability   Regular exercise-yes   Works some doing Air traffic controller Determinants of Corporate investment banker Strain: Low Risk  (02/07/2022)   Overall Financial Resource Strain (CARDIA)    Difficulty of Paying Living Expenses: Not hard at all  Food Insecurity: No Food Insecurity (02/07/2022)   Hunger Vital Sign    Worried About Programme researcher, broadcasting/film/video in  the Last Year: Never true    Ran Out of Food in the Last Year: Never true  Transportation Needs: No Transportation Needs (02/07/2022)   PRAPARE - Administrator, Civil Service (Medical): No    Lack of Transportation (Non-Medical): No  Physical Activity: Sufficiently Active (02/07/2022)   Exercise Vital Sign    Days of Exercise per Week: 7 days    Minutes of Exercise per Session: 60 min  Stress: No Stress Concern Present (02/07/2022)   [employer] of Occupational Health - Occupational Stress Questionnaire    Feeling of Stress : Not at all  Social Connections: Moderately Integrated (08/27/2020)   Social Connection and Isolation Panel [NHANES]    Frequency of Communication with Friends and Family: More than three times a week    Frequency of Social Gatherings with Friends and Family: Three times a week    Attends Religious Services: More than 4 times per year    Active Member of Clubs or Organizations: No    Attends Occupational hygienist Meetings: Never    Marital Status: Married    Family History  Problem Relation Age of Onset   Heart disease Other        FH of CAD   Colon polyps Paternal Uncle    Kidney disease Paternal Uncle    Diabetes Paternal Grandfather    Diabetes Maternal Uncle    Cancer Neg Hx    Colon cancer Neg Hx    Gallbladder disease Neg Hx    Esophageal cancer Neg Hx    Rectal cancer Neg Hx    Stomach cancer Neg Hx     Outpatient Encounter Medications as of 10/06/2022  Medication Sig   ACCU-CHEK GUIDE test strip TEST BLOOD SUGAR 7 TIMES DAILY   Accu-Chek Softclix Lancets lancets TEST BLOOD SUGAR 7 TIMES DAILY   Alcohol Swabs (B-D SINGLE USE SWABS REGULAR) PADS Use to cleanse site prior to use of lancet to obtain droplet of blood two times per day; E10.49   aspirin 81 MG tablet Take 81 mg by mouth daily.   atorvastatin (LIPITOR) 10 MG tablet TAKE 1 TABLET EVERY DAY   Blood Glucose Monitoring Suppl (ACCU-CHEK GUIDE ME) w/Device KIT 1 each by Does not apply route See admin instructions. Use to monitor glucose levels 7 times daily; E11.9; WILL NOT COMPLETE PA   empagliflozin (JARDIANCE) 10 MG TABS tablet Take 1 tablet (10 mg total) by mouth daily before breakfast.   gabapentin (NEURONTIN) 300 MG capsule TAKE 2 CAPSULES THREE TIMES DAILY   HYDROcodone bit-homatropine (HYCODAN) 5-1.5 MG/5ML syrup Take 5 mLs by mouth every 8 (eight) hours as needed for cough.   insulin isophane & regular human KwikPen (NOVOLIN 70/30 KWIKPEN) (70-30) 100 UNIT/ML KwikPen Inject 10 Units into the skin 2 (two) times daily before a meal.   INSULIN SYRINGE 1CC/29G (DROPLET INSULIN SYRINGE) 29G X 1/2" 1 ML MISC Use to inject in morning and bedtime   losartan (COZAAR) 50 MG tablet TAKE 1 TABLET EVERY DAY   meloxicam (MOBIC) 15 MG tablet Take 1 tablet (15 mg total) by mouth daily.   mupirocin ointment (BACTROBAN) 2 % Apply 1 application topically 2 (two) times daily.   Tetrahydrozoline HCl (VISINE OP) Place 1 drop  into both eyes daily as needed (redness).   triamcinolone cream (KENALOG) 0.1 % APPLY TO THE AFFECTED AREA(S) TOPICALLY THREE TIMES DAILY AS NEEDED FOR RASH   No facility-administered encounter medications on file as of 10/06/2022.  ALLERGIES: Allergies  Allergen Reactions   Penicillins Swelling    Swelling of the hands and feet, skin peeling Did it involve swelling of the face/tongue/throat, SOB, or low BP? No Did it involve sudden or severe rash/hives, skin peeling, or any reaction on the inside of your mouth or nose? No Did you need to seek medical attention at a hospital or doctor's office? Yes When did it last happen?      10 + years If all above answers are "NO", may proceed with cephalosporin use.    Ciprofloxacin Hives and Rash   Terbinafine Hcl Hives   Zestril [Lisinopril] Cough    VACCINATION STATUS: Immunization History  Administered Date(s) Administered   Fluad Quad(high Dose 65+) 03/03/2022   Influenza Whole 12/28/2008   Influenza,inj,Quad PF,6+ Mos 11/21/2013, 04/01/2015, 12/20/2015, 01/08/2017, 12/17/2017, 10/25/2018, 12/19/2019, 03/28/2021   Moderna Sars-Covid-2 Vaccination 04/29/2019, 05/27/2019, 12/29/2019   Pneumococcal Polysaccharide-23 06/11/2008   Td 09/29/2005   Tdap 10/15/2017   Zoster Recombinant(Shingrix) 06/14/2020, 08/25/2020    Diabetes He presents for his follow-up diabetic visit. He has type 2 diabetes mellitus. Onset time: was diagnosed at approx age of 44. His disease course has been improving. Hypoglycemia symptoms include nervousness/anxiousness, sweats and tremors. Associated symptoms include foot paresthesias. Pertinent negatives for diabetes include no blurred vision, no chest pain, no fatigue, no polydipsia, no polyphagia, no polyuria and no weight loss. There are no hypoglycemic complications. Symptoms are stable. Diabetic complications include heart disease, impotence, nephropathy, peripheral neuropathy and retinopathy. (Had right TMA for  osteomyelitis secondary to diabetic foot ulcer) Risk factors for coronary artery disease include diabetes mellitus, dyslipidemia, male sex and hypertension. Current diabetic treatment includes insulin injections. He is compliant with treatment most of the time. His weight is fluctuating minimally. He is following a generally healthy diet. When asked about meal planning, he reported none. He has not had a previous visit with a dietitian. He participates in exercise three times a week. His home blood glucose trend is fluctuating minimally. His overall blood glucose range is 110-130 mg/dl. (He presents today with his meter and logs showing tightening glycemic profile.  His POCT A1c today is 7.3%, improving from last visit of 7.8%.  He does have frequent hypoglycemia, at supper and bedtime mostly.  Analysis of his meter shows 7-day average of 106, 14-day average of 110, 30-day average of 124, 90-day average of 126.) An ACE inhibitor/angiotensin II receptor blocker is being taken. He sees a podiatrist.Eye exam is current.  Hyperlipidemia This is a chronic problem. The current episode started more than 1 year ago. The problem is controlled. Recent lipid tests were reviewed and are normal. Exacerbating diseases include chronic renal disease and diabetes. There are no known factors aggravating his hyperlipidemia. Pertinent negatives include no chest pain. Current antihyperlipidemic treatment includes statins. The current treatment provides moderate improvement of lipids. There are no compliance problems.  Risk factors for coronary artery disease include diabetes mellitus, dyslipidemia, hypertension and male sex.  Hypertension This is a chronic problem. The current episode started more than 1 year ago. The problem has been gradually improving since onset. The problem is controlled. Associated symptoms include sweats. Pertinent negatives include no blurred vision or chest pain. There are no associated agents to  hypertension. Risk factors for coronary artery disease include diabetes mellitus, dyslipidemia and male gender. Past treatments include angiotensin blockers. The current treatment provides moderate improvement. There are no compliance problems.  Hypertensive end-organ damage includes kidney disease, heart failure and retinopathy. Identifiable causes of  hypertension include chronic renal disease.    Review of systems  Constitutional: + Minimally fluctuating body weight,  current Body mass index is 31.94 kg/m. , no fatigue, no subjective hyperthermia, no subjective hypothermia Eyes: no blurry vision, no xerophthalmia ENT: no sore throat, no nodules palpated in throat, no dysphagia/odynophagia, no hoarseness Cardiovascular: no chest pain, no shortness of breath, no palpitations, no leg swelling Respiratory: no cough, no shortness of breath Gastrointestinal: no nausea/vomiting/diarrhea Musculoskeletal: no muscle/joint aches Skin: no rashes, no hyperemia Neurological: no tremors, no numbness, no tingling, no dizziness Psychiatric: no depression, no anxiety  Objective:     BP 120/68 (BP Location: Left Arm, Patient Position: Sitting, Cuff Size: Large)   Pulse 62   Ht 5\' 11"  (1.803 m)   Wt 229 lb (103.9 kg)   BMI 31.94 kg/m   Wt Readings from Last 3 Encounters:  10/06/22 229 lb (103.9 kg)  07/14/22 223 lb (101.2 kg)  06/20/22 227 lb (103 kg)     BP Readings from Last 3 Encounters:  10/06/22 120/68  07/14/22 122/62  06/20/22 124/64      Physical Exam- Limited  Constitutional:  Body mass index is 31.94 kg/m. , not in acute distress, normal state of mind Eyes:  EOMI, no exophthalmos Musculoskeletal: no gross deformities, strength intact in all four extremities, no gross restriction of joint movements Skin:  no rashes, no hyperemia Neurological: no tremor with outstretched hands   Diabetic Foot Exam - Simple   No data filed     CMP ( most recent) CMP     Component Value  Date/Time   NA 141 03/06/2022 1628   NA 141 04/15/2021 0826   K 4.6 03/06/2022 1628   CL 107 03/06/2022 1628   CO2 25 03/06/2022 1628   GLUCOSE 140 (H) 03/06/2022 1628   BUN 11 03/06/2022 1628   BUN 19 04/15/2021 0826   CREATININE 1.20 04/20/2022 0809   CREATININE 1.03 03/06/2022 1628   CALCIUM 9.5 03/06/2022 1628   PROT 7.9 03/06/2022 1628   PROT 7.2 04/15/2021 0826   ALBUMIN 4.2 04/15/2021 0826   AST 21 03/06/2022 1628   ALT 14 03/06/2022 1628   ALKPHOS 116 04/15/2021 0826   BILITOT 0.4 03/06/2022 1628   BILITOT 0.5 04/15/2021 0826   GFRNONAA >60 08/27/2021 1008   GFRNONAA 76 05/07/2020 0816   GFRAA 89 05/07/2020 0816     Diabetic Labs (most recent): Lab Results  Component Value Date   HGBA1C 7.3 (A) 10/06/2022   HGBA1C 7.8 (A) 06/02/2022   HGBA1C 7.5 (A) 01/27/2022   MICROALBUR 150 10/20/2021   MICROALBUR 7.9 05/07/2020   MICROALBUR 17.5 (H) 10/15/2017     Lipid Panel ( most recent) Lipid Panel     Component Value Date/Time   CHOL 154 04/15/2021 0826   TRIG 89 04/15/2021 0826   HDL 48 04/15/2021 0826   CHOLHDL 3.2 04/15/2021 0826   CHOLHDL 2.7 05/07/2020 0816   VLDL 33.0 10/15/2017 0818   LDLCALC 89 04/15/2021 0826   LDLCALC 65 05/07/2020 0816   LDLDIRECT 120.2 04/04/2013 0918   LABVLDL 17 04/15/2021 0826      Lab Results  Component Value Date   TSH 1.490 04/15/2021   TSH 1.10 10/15/2017   TSH 1.63 04/28/2016   TSH 0.99 05/21/2014   TSH 1.43 04/04/2013   TSH 1.28 11/13/2011   TSH 0.77 03/29/2010   TSH 0.95 09/01/2009   TSH 0.71 02/08/2009   TSH 0.75 02/07/2008   FREET4 1.32 04/15/2021  Assessment & Plan:   1) Controlled Type 2 Diabetes without complication  He presents today with his meter and logs showing tightening glycemic profile.  His POCT A1c today is 7.3%, improving from last visit of 7.8%.  He does have frequent hypoglycemia, at supper and bedtime mostly.  Analysis of his meter shows 7-day average of 106, 14-day average  of 110, 30-day average of 124, 90-day average of 126.  - Torrion K Dillow has currently uncontrolled symptomatic type 2 DM since 67 years of age.  -Recent labs reviewed.  - I had a long discussion with him about the progressive nature of diabetes and the pathology behind its complications. -his diabetes is complicated by retinopathy, CKD, neuropathy, recent TMA and he remains at a high risk for more acute and chronic complications which include CAD, CVA, CKD, retinopathy, and neuropathy. These are all discussed in detail with him.  - Nutritional counseling repeated at each appointment due to patients tendency to fall back in to old habits.  - The patient admits there is a room for improvement in their diet and drink choices. -  Suggestion is made for the patient to avoid simple carbohydrates from their diet including Cakes, Sweet Desserts / Pastries, Ice Cream, Soda (diet and regular), Sweet Tea, Candies, Chips, Cookies, Sweet Pastries, Store Bought Juices, Alcohol in Excess of 1-2 drinks a day, Artificial Sweeteners, Coffee Creamer, and "Sugar-free" Products. This will help patient to have stable blood glucose profile and potentially avoid unintended weight gain.   - I encouraged the patient to switch to unprocessed or minimally processed complex starch and increased protein intake (animal or plant source), fruits, and vegetables.   - Patient is advised to stick to a routine mealtimes to eat 3 meals a day and avoid unnecessary snacks (to snack only to correct hypoglycemia).  - I have approached him with the following individualized plan to manage  his diabetes and patient agrees:   -We discussed changing his regimen today to help both his CHF and diabetes and given his hypoglycemia with insulin, I think now is the time to do so.  I stopped his insulin today and started him on low dose Jardiance 10 mg po daily x 30 days.  We did discuss potential side effects of this medication today and he is  advised to reach out to me with any concerns moving forward.  -he is encouraged to continue monitoring blood glucose 2 times daily, before breakfast and before bed, and to call the clinic if he has readings less than 70 or greater than 300 for 3 tests in a row.   - he is warned not to take insulin without proper monitoring per orders. - Adjustment parameters are given to him for hypo and hyperglycemia in writing.  -He did not tolerate GLP1- had significant GI symptoms.   - Specific targets for  A1c;  LDL, HDL,  and Triglycerides were discussed with the patient.  2) Blood Pressure /Hypertension:  his blood pressure is controlled to target.   he is advised to continue his current medications including Cozaar 50 mg p.o. daily with breakfast.    3) Lipids/Hyperlipidemia:    Review of his recent lipid panel from 04/15/21 showed controlled  LDL at 89 .  he  is advised to continue Lipitor 10 mg daily at bedtime.  Side effects and precautions discussed with him.   4)  Weight/Diet:  his Body mass index is 31.94 kg/m.  -   clearly complicating his diabetes care.  he is a candidate for weight loss. I discussed with him the fact that loss of 5 - 10% of his  current body weight will have the most impact on his diabetes management.  Exercise, and detailed carbohydrates information provided  -  detailed on discharge instructions.  5) Chronic Care/Health Maintenance: -he is on ACEI/ARB and Statin medications and is encouraged to initiate and continue to follow up with Ophthalmology, Dentist, Podiatrist at least yearly or according to recommendations, and advised to stay away from smoking. I have recommended yearly flu vaccine and pneumonia vaccine at least every 5 years; moderate intensity exercise for up to 150 minutes weekly; and sleep for at least 7 hours a day.  - he is advised to maintain close follow up with Donita Brooks, MD for primary care needs, as well as his other providers for optimal and  coordinated care.     I spent  38  minutes in the care of the patient today including review of labs from CMP, Lipids, Thyroid Function, Hematology (current and previous including abstractions from other facilities); face-to-face time discussing  his blood glucose readings/logs, discussing hypoglycemia and hyperglycemia episodes and symptoms, medications doses, his options of short and long term treatment based on the latest standards of care / guidelines;  discussion about incorporating lifestyle medicine;  and documenting the encounter. Risk reduction counseling performed per USPSTF guidelines to reduce obesity and cardiovascular risk factors.     Please refer to Patient Instructions for Blood Glucose Monitoring and Insulin/Medications Dosing Guide"  in media tab for additional information. Please  also refer to " Patient Self Inventory" in the Media  tab for reviewed elements of pertinent patient history.  Goku K Beckett participated in the discussions, expressed understanding, and voiced agreement with the above plans.  All questions were answered to his satisfaction. he is encouraged to contact clinic should he have any questions or concerns prior to his return visit.   Follow up plan: - Return in about 4 months (around 02/05/2023) for Diabetes F/U with A1c in office, No previsit labs, Bring meter and logs.  Ronny Bacon, [facility] [address] [place], [place] [address] Phone: [phone] Fax: [phone]  10/06/2022, 9:06 AM

## 2022-10-13 ENCOUNTER — Ambulatory Visit: Payer: Medicare HMO | Admitting: Podiatry

## 2022-10-13 DIAGNOSIS — T148XXA Other injury of unspecified body region, initial encounter: Secondary | ICD-10-CM

## 2022-10-13 NOTE — Patient Instructions (Signed)

## 2022-10-13 NOTE — Progress Notes (Signed)
Subjective: 67 year old male presents as above evaluation of blister on the right foot.  States he is doing much better.  No pain, swelling or any drainage.  No fevers or chills that he reports.  He has no concerns today.  He  Objective: AAO x3, NAD DP/PT pulses palpable bilaterally, CRT less than 3 seconds Hyperkeratotic lesion at the distal aspect of right foot along the TMA site.  Upon debridement there is no underlying ulceration, drainage or any signs of infection noted.  The blister on the plantar aspect of the foot seems to have resolved. There is some dry skin present but no skin breakdown.  No pain with calf compression, swelling, warmth, erythema  Assessment: Resolved blister right foot, callus  Plan: -All treatment options discussed with the patient including all alternatives, risks, complications.  -She will be debrided the callus with any complications or bleeding.  Moisturizer daily.  Monitoring signs or symptoms of infection or reoccurrence. -Daily foot inspection. -Patient encouraged to call the office with any questions, concerns, change in symptoms.   Vivi Barrack DPM

## 2022-10-31 ENCOUNTER — Telehealth: Payer: Self-pay | Admitting: *Deleted

## 2022-10-31 MED ORDER — LINAGLIPTIN 5 MG PO TABS: 5.0000 mg | ORAL_TABLET | Freq: Every day | ORAL | 1 refills | Status: DC

## 2022-10-31 NOTE — Addendum Note (Signed)
Addended by: Dani Gobble on: 10/31/2022 09:56 AM   Modules accepted: Orders

## 2022-10-31 NOTE — Telephone Encounter (Signed)
Patient left a voicemail. He shares that since his change from the insulin to the Jardiance, 10 mg daily, his blood sugars have been running a lot higher.  Returned call to the patient. He does not have his meter with him.Blood Glucose running 160 - 170 daily. These readings are in the mornings and afternoons. Noted rash bottom of stomach, he also states that he is experiencing itching.He shares that he is not having any other symptoms.

## 2022-10-31 NOTE — Telephone Encounter (Signed)
No, it shouldn't affect his kidney function.  Although we will most certainly keep an eye on kidney function as uncontrolled diabetes can affect his kidneys.  I sent in Tradjenta 5 mg po daily for him.  I did a 30 day prescription just to see how he tolerates it.

## 2022-10-31 NOTE — Telephone Encounter (Signed)
Talked with the patient. He is in agreement with either one of the 2 medications. He just wants to be very sure that he will not have any issues with his kidneys in the future. Patient will use the [facility] in [place].

## 2022-10-31 NOTE — Telephone Encounter (Signed)
Patient was called and made aware. 

## 2022-10-31 NOTE — Telephone Encounter (Signed)
Aaron Fox!  Sounds like yeast formation.  Have him stop the Jardiance for now.  He didn't tolerate insulin in the past as it caused significant hypoglycemia but he also didn't tolerate Trulicity either.  We can try a different class of medication that in general does not have unpleasant side effects like Januvia or Tradjenta.  Is he willing to try something new?

## 2022-11-09 ENCOUNTER — Encounter: Payer: Medicare HMO | Admitting: Pharmacist

## 2022-11-16 ENCOUNTER — Telehealth: Payer: Self-pay | Admitting: Family Medicine

## 2022-11-16 NOTE — Telephone Encounter (Signed)
Patient called to request a referral to an orthopedic specialist regarding ongoing issue with knee pain; stated he's tired of getting cortisone shots.  Please advise at [phone].

## 2022-11-17 ENCOUNTER — Other Ambulatory Visit: Payer: Self-pay | Admitting: Family Medicine

## 2022-11-17 DIAGNOSIS — G8929 Other chronic pain: Secondary | ICD-10-CM

## 2022-12-01 ENCOUNTER — Ambulatory Visit (INDEPENDENT_AMBULATORY_CARE_PROVIDER_SITE_OTHER): Payer: Medicare HMO | Admitting: Orthopedic Surgery

## 2022-12-01 ENCOUNTER — Other Ambulatory Visit (INDEPENDENT_AMBULATORY_CARE_PROVIDER_SITE_OTHER): Payer: Self-pay

## 2022-12-01 ENCOUNTER — Encounter: Payer: Self-pay | Admitting: Orthopedic Surgery

## 2022-12-01 VITALS — BP 115/72 | HR 83 | Ht 66.0 in | Wt 262.0 lb

## 2022-12-01 DIAGNOSIS — Z6841 Body Mass Index (BMI) 40.0 and over, adult: Secondary | ICD-10-CM | POA: Diagnosis not present

## 2022-12-01 DIAGNOSIS — G8929 Other chronic pain: Secondary | ICD-10-CM

## 2022-12-01 DIAGNOSIS — M1712 Unilateral primary osteoarthritis, left knee: Secondary | ICD-10-CM

## 2022-12-01 DIAGNOSIS — Z89431 Acquired absence of right foot: Secondary | ICD-10-CM | POA: Diagnosis not present

## 2022-12-01 DIAGNOSIS — M25462 Effusion, left knee: Secondary | ICD-10-CM

## 2022-12-01 DIAGNOSIS — M170 Bilateral primary osteoarthritis of knee: Secondary | ICD-10-CM | POA: Diagnosis not present

## 2022-12-01 DIAGNOSIS — M1711 Unilateral primary osteoarthritis, right knee: Secondary | ICD-10-CM

## 2022-12-01 MED ORDER — METHYLPREDNISOLONE ACETATE 40 MG/ML IJ SUSP
40.0000 mg | Freq: Once | INTRAMUSCULAR | Status: AC
  Administered 2022-12-01: 40 mg via INTRA_ARTICULAR

## 2022-12-01 NOTE — Progress Notes (Signed)
Chief Complaint  Patient presents with   Knee Pain    No injuries  bil knee pain x 2d years left knee worse than right knee   Body mass index is 42.29 kg/m.  BP 115/72   Pulse 83   Ht 5\' 6"  (1.676 m)   Wt 262 lb (118.8 kg)   BMI 42.29 kg/m   Patient: Aaron Fox           Date of Birth: 08/30/1955           MRN: [id number] Visit Date: 12/01/2022 Requested by: Donita Brooks, MD [address] [place],  [place] [address] PCP: Donita Brooks, MD    Encounter Diagnoses  Name Primary?   Chronic pain of left knee Yes   Primary osteoarthritis of left knee    Primary osteoarthritis of right knee    Effusion of knee joint, left     Plan:  67 year old male had injections in his knees tried some anti-inflammatories but was concerned about its effect on his kidneys and preferred not to take that medication  Recommend Tylenol Extra Strength 500 mg every 6  Aspiration injection left knee  Injection right knee    Chief Complaint  Patient presents with   Knee Pain    No injuries  bil knee pain x 2d years left knee worse than right knee     67 year old male longstanding osteoarthritis of both knees longstanding pain medial side of both knees.  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   Problem list, medical hx, medications and allergies reviewed   Review of Systems  Constitutional:  Negative for fever.  Respiratory:  Negative for shortness of breath.   Cardiovascular:  Negative for chest pain.  Skin: Negative.   Neurological:  Negative for tingling and sensory change.     Allergies  Allergen Reactions   Penicillins Swelling     Swelling of the hands and feet, skin peeling Did it involve swelling of the face/tongue/throat, SOB, or low BP? No Did it involve sudden or severe rash/hives, skin peeling, or any reaction on the inside of your mouth or nose? No Did you need to seek medical attention at a hospital or doctor's office? Yes When did it last happen?      10 + years If all above answers are "NO", may proceed with cephalosporin use.    Ciprofloxacin Hives and Rash   Terbinafine Hcl Hives   Zestril [Lisinopril] Cough    BP 115/72   Pulse 83   Ht 5\' 6"  (1.676 m)   Wt 262 lb (118.8 kg)   BMI 42.29 kg/m    Physical exam: Physical Exam Vitals and nursing note reviewed.  Constitutional:      Appearance: Normal appearance.  HENT:     Head: Normocephalic and atraumatic.  Eyes:     General: No scleral icterus.       Right eye: No discharge.        Left eye: No discharge.     Extraocular Movements: Extraocular movements intact.     Conjunctiva/sclera: Conjunctivae normal.  Pupils: Pupils are equal, round, and reactive to light.  Cardiovascular:     Rate and Rhythm: Normal rate.     Pulses: Normal pulses.  Skin:    General: Skin is warm and dry.     Capillary Refill: Capillary refill takes less than 2 seconds.  Neurological:     General: No focal deficit present.     Mental Status: He is alert and oriented to person, place, and time.  Psychiatric:        Mood and Affect: Mood normal.        Behavior: Behavior normal.        Thought Content: Thought content normal.        Judgment: Judgment normal.     Ortho Exam  MSK:  Right knee no effusion slight flexion contracture less than 5 degrees flexion arc is 5 degrees - 125 degrees slight varus  No instability  Left knee large effusion again slight flexion contracture flexion arc 120 degrees from 5 degrees No instability slight varus  Data reviewed:   Image(s) reviewed with personal interpretation:  Outside imaging #1 shows a right knee  dated September 29, 2022 with mild OA left knee today in the office shows more severe OA grade 3  Assessment and plan:  Encounter Diagnoses  Name Primary?   Chronic pain of left knee Yes   Primary osteoarthritis of left knee    Primary osteoarthritis of right knee    Effusion of knee joint, left        Meds ordered this encounter  Medications   methylPREDNISolone acetate (DEPO-MEDROL) injection 40 mg   methylPREDNISolone acetate (DEPO-MEDROL) injection 40 mg    Procedures:   #1  Procedure note right knee injection   verbal consent was obtained to inject right knee joint  Timeout was completed to confirm the site of injection  The medications used were depomedrol 40 mg and 1% lidocaine 3 cc Anesthesia was provided by ethyl chloride and the skin was prepped with alcohol.  After cleaning the skin with alcohol a 20-gauge needle was used to inject the right knee joint. There were no complications. A sterile bandage was applied.    #2  Procedure note injection and aspiration left knee joint  Verbal consent was obtained to aspirate and inject the left knee joint   Timeout was completed to confirm the site of aspiration and injection  An 18-gauge needle was used to aspirate the left knee joint from a suprapatellar lateral approach.  The medications used were 40 mg of Depo-Medrol and 1% lidocaine 3 cc  Anesthesia was provided by ethyl chloride and the skin was prepped with alcohol.  After cleaning the skin with alcohol an 18-gauge needle was used to aspirate the right knee joint.  We obtained 45 cc of fluid CLR YELL  We followed this by injection of 40 mg of Depo-Medrol and 3 cc 1% lidocaine.  There were no complications. A sterile bandage was applied.

## 2022-12-01 NOTE — Patient Instructions (Signed)
Tylenol XS (500mg ) 1 ev 6 hrs   You have received an injection of steroids into the joint. 15% of patients will have increased pain within the 24 hours postinjection.   This is transient and will go away.   We recommend that you use ice packs on the injection site for 20 minutes every 2 hours and extra strength Tylenol 2 tablets every 8 as needed until the pain resolves.  If you continue to have pain after taking the Tylenol and using the ice please call the office for further instructions.'

## 2022-12-11 ENCOUNTER — Other Ambulatory Visit: Payer: Self-pay | Admitting: Family Medicine

## 2022-12-11 DIAGNOSIS — E11319 Type 2 diabetes mellitus with unspecified diabetic retinopathy without macular edema: Secondary | ICD-10-CM

## 2022-12-12 NOTE — Telephone Encounter (Signed)
Due to a system glitch the last office visit for this practice is not detected correctly.   LOV 07/14/2022.   Criteria met.  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 Donita Brooks, MD   3 years ago Uncontrolled type 2 diabetes mellitus with skin complication, with long-term current use of insulin (HCC)   [facility] Donita Brooks, MD   3 years ago Uncontrolled type 2 diabetes mellitus with skin complication, with long-term current use of insulin (HCC)   [facility] Pickard, Priscille Heidelberg, MD   4 years ago Establishing care with new doctor, encounter for   [facility] Pickard, Priscille Heidelberg, MD

## 2022-12-23 ENCOUNTER — Other Ambulatory Visit: Payer: Self-pay | Admitting: Family Medicine

## 2022-12-25 NOTE — Telephone Encounter (Signed)
Requested medications are due for refill today.  yes  Requested medications are on the active medications list.  yes  Last refill. 12/22/2021 #90 10 rf  Future visit scheduled.   no  Notes to clinic.  Pt is overdue for a cpe.  Requested Prescriptions  Pending Prescriptions Disp Refills   losartan (COZAAR) 50 MG tablet [Pharmacy Med Name: Losartan Potassium Oral Tablet 50 MG] 90 tablet 3    Sig: TAKE 1 TABLET EVERY DAY     Cardiovascular:  Angiotensin Receptor Blockers Failed - 12/23/2022  9:02 AM      Failed - Cr in normal range and within 180 days    Creat  Date Value Ref Range Status  03/06/2022 1.03 0.70 - 1.35 mg/dL Final   Creatinine, Ser  Date Value Ref Range Status  04/20/2022 1.20 0.61 - 1.24 mg/dL Final   Creatinine,U  Date Value Ref Range Status  10/15/2017 209.4 mg/dL Final         Failed - K in normal range and within 180 days    Potassium  Date Value Ref Range Status  03/06/2022 4.6 3.5 - 5.3 mmol/L Final         Failed - Valid encounter within last 6 months    Recent Outpatient Visits           1 year ago Pleurisy   [facility] Donita Brooks, MD   2 years ago Uncontrolled type 2 diabetes mellitus with skin complication, with long-term current use of insulin (HCC)   [facility] Donita Brooks, MD   3 years ago Uncontrolled type 2 diabetes mellitus with skin complication, with long-term current use of insulin (HCC)   [facility] Pickard, Priscille Heidelberg, MD   3 years ago Uncontrolled type 2 diabetes mellitus with skin complication, with long-term current use of insulin (HCC)   [facility] Pickard, Priscille Heidelberg, MD   4 years ago Establishing care with new doctor, encounter for   [facility] Pickard, Priscille Heidelberg, MD              Passed - Patient is not pregnant      Passed - Last BP in normal range    BP Readings from Last 1 Encounters:  12/01/22 115/72

## 2023-01-08 ENCOUNTER — Telehealth: Payer: Self-pay

## 2023-01-08 ENCOUNTER — Other Ambulatory Visit: Payer: Self-pay | Admitting: Family Medicine

## 2023-01-08 MED ORDER — GABAPENTIN 300 MG PO CAPS: ORAL_CAPSULE | ORAL | 2 refills | Status: DC

## 2023-01-08 NOTE — Telephone Encounter (Signed)
Copied from CRM [id number]. 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

## 2023-01-12 ENCOUNTER — Ambulatory Visit (INDEPENDENT_AMBULATORY_CARE_PROVIDER_SITE_OTHER): Payer: Medicare HMO

## 2023-01-12 DIAGNOSIS — Z23 Encounter for immunization: Secondary | ICD-10-CM | POA: Diagnosis not present

## 2023-01-12 NOTE — Progress Notes (Cosign Needed Addendum)
Patient is in office today for a nurse visit for Immunization. Patient Injection was given in the  Left deltoid. Patient tolerated injection well.

## 2023-01-19 ENCOUNTER — Encounter: Payer: Self-pay | Admitting: Podiatry

## 2023-01-19 ENCOUNTER — Ambulatory Visit: Payer: Medicare HMO | Admitting: Podiatry

## 2023-01-19 DIAGNOSIS — B351 Tinea unguium: Secondary | ICD-10-CM | POA: Diagnosis not present

## 2023-01-19 DIAGNOSIS — E1149 Type 2 diabetes mellitus with other diabetic neurological complication: Secondary | ICD-10-CM

## 2023-01-19 DIAGNOSIS — M79675 Pain in left toe(s): Secondary | ICD-10-CM

## 2023-01-19 DIAGNOSIS — L84 Corns and callosities: Secondary | ICD-10-CM

## 2023-01-19 NOTE — Progress Notes (Signed)
  Subjective: Chief Complaint  Patient presents with   [facility]    RM#[id number] [facility] patient states is doing well blood sugars under control no concerns at this time.   67 year old male presents with the above concerns. No open lesions. He gets a callus on the right foot.   Last A1c was 7.3 on 10/06/2022.   Objective: AAO x3, NAD DP/PT pulses palpable bilaterally, CRT less than 3 seconds Sensation decreased with SWMF Nails are hypertrophic, dystrophic, brittle, discolored, elongated 10. No surrounding redness or drainage. Tenderness nails 1-5 on left. TMA on right  Hyperkeratotic lesion right foot distal medial without any underlying ulceration, drainage, or signs of infection.  No pain with calf compression, swelling, warmth, erythema  Assessment: Symptomatic onychomycosis, per-ulcerative callus  Plan: -All treatment options discussed with the patient including all alternatives, risks, complications.  -Nails sharply debrided x 5 without complications or bleeding -Hyperkeratotic lesion sharply debrided x 1 without any complications or bleeding. Moisturizer daily -Daily foot inspection, glucose control  -Patient encouraged to call the office with any questions, concerns, change in symptoms.   Vivi Barrack DPM

## 2023-02-02 ENCOUNTER — Ambulatory Visit: Payer: Medicare HMO | Admitting: Nurse Practitioner

## 2023-02-02 ENCOUNTER — Encounter: Payer: Self-pay | Admitting: Nurse Practitioner

## 2023-02-02 VITALS — BP 139/78 | HR 66 | Ht 66.0 in | Wt 236.0 lb

## 2023-02-02 DIAGNOSIS — E119 Type 2 diabetes mellitus without complications: Secondary | ICD-10-CM | POA: Diagnosis not present

## 2023-02-02 DIAGNOSIS — Z794 Long term (current) use of insulin: Secondary | ICD-10-CM

## 2023-02-02 DIAGNOSIS — E1165 Type 2 diabetes mellitus with hyperglycemia: Secondary | ICD-10-CM | POA: Diagnosis not present

## 2023-02-02 LAB — POCT GLYCOSYLATED HEMOGLOBIN (HGB A1C): Hemoglobin A1C: 9 % — AB (ref 4.0–5.6)

## 2023-02-02 MED ORDER — LINAGLIPTIN 5 MG PO TABS: 5.0000 mg | ORAL_TABLET | Freq: Every day | ORAL | 1 refills | Status: DC

## 2023-02-02 MED ORDER — DEXCOM G7 SENSOR MISC: 1.0000 | 3 refills | Status: DC

## 2023-02-02 MED ORDER — ACCU-CHEK GUIDE TEST VI STRP: ORAL_STRIP | 6 refills | Status: DC

## 2023-02-02 NOTE — Progress Notes (Signed)
Endocrinology Follow Up Note       02/02/2023, 8:37 AM   Subjective:    Patient ID: Aaron Fox, male    DOB: [birth year].  Aaron Fox is being seen in follow up after being seen in consultation for management of currently uncontrolled symptomatic diabetes requested by  Donita Brooks, MD.   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 Vonna Kotyk   CATARACT EXTRACTION, BILATERAL     COLONOSCOPY  2004   LYMPH NODE BIOPSY Right 10/16/2014   Procedure: RIGHT INGUINAL LYMPH NODE BIOPSY;  Surgeon: Franky Macho, MD;  Location: [facility];  Service: General;  Laterality: Right;    Social History   Socioeconomic History   Marital status: Married    Spouse name: Not on file   Number of children: 3   Years of education: Not on file   Highest  education level: Not on file  Occupational History   Occupation: Disability    Employer: [employer]  Tobacco Use   Smoking status: Former    Current packs/day: 0.00    Types: Cigarettes    Quit date: 11/02/1999    Years since quitting: 23.2   Smokeless tobacco: Never  Vaping Use   Vaping status: Never Used  Substance and Sexual Activity   Alcohol use: No    Alcohol/week: 0.0 standard drinks of alcohol   Drug use: No   Sexual activity: Not on file  Other Topics Concern   Not on file  Social History Narrative   ** Merged History Encounter ** Married, 3 kids   Prior work [employer] before disability   Regular exercise-yes   Works some doing Air traffic controller Determinants of Corporate investment banker Strain: Low Risk  (02/07/2022)   Overall Financial Resource Strain (CARDIA)    Difficulty of Paying Living Expenses: Not hard at all  Food Insecurity: No Food Insecurity (02/07/2022)   Hunger Vital Sign    Worried About Programme researcher, broadcasting/film/video in  the Last Year: Never true    Ran Out of Food in the Last Year: Never true  Transportation Needs: No Transportation Needs (02/07/2022)   PRAPARE - Administrator, Civil Service (Medical): No    Lack of Transportation (Non-Medical): No  Physical Activity: Sufficiently Active (02/07/2022)   Exercise Vital Sign    Days of Exercise per Week: 7 days    Minutes of Exercise per Session: 60 min  Stress: No Stress Concern Present (02/07/2022)   [employer] of Occupational Health - Occupational Stress Questionnaire    Feeling of Stress : Not at all  Social Connections: Moderately Integrated (08/27/2020)   Social Connection and Isolation Panel [NHANES]    Frequency of Communication with Friends and Family: More than three times a week    Frequency of Social Gatherings with Friends and Family: Three times a week    Attends Religious Services: More than 4 times per year    Active Member of Clubs or Organizations: No    Attends Occupational hygienist Meetings: Never    Marital Status: Married    Family History  Problem Relation Age of Onset   Heart disease Other        FH of CAD   Colon polyps Paternal Uncle    Kidney disease Paternal Uncle    Diabetes Paternal Grandfather    Diabetes Maternal Uncle    Cancer Neg Hx    Colon cancer Neg Hx    Gallbladder disease Neg Hx    Esophageal cancer Neg Hx    Rectal cancer Neg Hx    Stomach cancer Neg Hx     Outpatient Encounter Medications as of 02/02/2023  Medication Sig   ACCU-CHEK GUIDE test strip TEST BLOOD SUGAR 7 TIMES DAILY   Accu-Chek Softclix Lancets lancets TEST BLOOD SUGAR 7 TIMES DAILY   Alcohol Swabs (B-D SINGLE USE SWABS REGULAR) PADS Use to cleanse site prior to use of lancet to obtain droplet of blood two times per day; E10.49   aspirin 81 MG tablet Take 81 mg by mouth daily.   atorvastatin (LIPITOR) 10 MG tablet TAKE 1 TABLET EVERY DAY   Blood Glucose Monitoring Suppl (ACCU-CHEK GUIDE ME) w/Device KIT 1 each by Does not apply route See admin instructions. Use to monitor glucose levels 7 times daily; E11.9; WILL NOT COMPLETE PA   empagliflozin (JARDIANCE) 10 MG TABS tablet Take 1 tablet (10 mg total) by mouth daily before breakfast.   gabapentin (NEURONTIN) 300 MG capsule TAKE 2 CAPSULES THREE TIMES DAILY   HYDROcodone bit-homatropine (HYCODAN) 5-1.5 MG/5ML syrup Take 5 mLs by mouth every 8 (eight) hours as needed for cough.   insulin isophane & regular human KwikPen (NOVOLIN 70/30 KWIKPEN) (70-30) 100 UNIT/ML KwikPen Inject 10 Units into the skin 2 (two) times daily before a meal.   INSULIN SYRINGE 1CC/29G (DROPLET INSULIN SYRINGE) 29G X 1/2" 1 ML MISC Use to inject in morning and bedtime   linagliptin (TRADJENTA) 5 MG TABS tablet Take 1 tablet (5 mg total) by mouth daily.   losartan (COZAAR) 50 MG tablet TAKE 1 TABLET EVERY DAY   meloxicam (MOBIC) 15 MG tablet Take 1 tablet (15 mg total) by mouth daily.   mupirocin ointment (BACTROBAN) 2 % Apply 1  application topically 2 (two) times daily.   Tetrahydrozoline HCl (VISINE OP) Place 1 drop into both eyes daily as needed (redness).   triamcinolone cream (KENALOG) 0.1 % APPLY TO THE AFFECTED AREA(S) TOPICALLY THREE TIMES DAILY AS  NEEDED FOR RASH   No facility-administered encounter medications on file as of 02/02/2023.    ALLERGIES: Allergies  Allergen Reactions   Penicillins Swelling    Swelling of the hands and feet, skin peeling Did it involve swelling of the face/tongue/throat, SOB, or low BP? No Did it involve sudden or severe rash/hives, skin peeling, or any reaction on the inside of your mouth or nose? No Did you need to seek medical attention at a hospital or doctor's office? Yes When did it last happen?      10 + years If all above answers are "NO", may proceed with cephalosporin use.    Ciprofloxacin Hives and Rash   Terbinafine Hcl Hives   Zestril [Lisinopril] Cough    VACCINATION STATUS: Immunization History  Administered Date(s) Administered   Fluad Quad(high Dose 65+) 03/03/2022   Fluad Trivalent(High Dose 65+) 01/12/2023   Influenza Whole 12/28/2008   Influenza,inj,Quad PF,6+ Mos 11/21/2013, 04/01/2015, 12/20/2015, 01/08/2017, 12/17/2017, 10/25/2018, 12/19/2019, 03/28/2021   Moderna Sars-Covid-2 Vaccination 04/29/2019, 05/27/2019, 12/29/2019   Pneumococcal Polysaccharide-23 06/11/2008   Td 09/29/2005   Tdap 10/15/2017   Zoster Recombinant(Shingrix) 06/14/2020, 08/25/2020    Diabetes He presents for his follow-up diabetic visit. He has type 2 diabetes mellitus. Onset time: was diagnosed at approx age of 60. His disease course has been worsening. Hypoglycemia symptoms include nervousness/anxiousness, sweats and tremors. Associated symptoms include foot paresthesias. Pertinent negatives for diabetes include no blurred vision, no chest pain, no fatigue, no polydipsia, no polyphagia, no polyuria and no weight loss. There are no hypoglycemic complications. Symptoms are  stable. Diabetic complications include heart disease, impotence, nephropathy, peripheral neuropathy and retinopathy. 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 Current diabetic treatment includes oral agent (monotherapy). He is compliant with treatment most of the time. His weight is fluctuating minimally. He is following a generally healthy diet. When asked about meal planning, he reported none. He has not had a previous visit with a dietitian. He participates in exercise three times a week. His home blood glucose trend is fluctuating dramatically. (He presents today with his meter and logs showing dramatically fluctuating glycemic profile.  His POCT A1c today is 9%, increasing from last visit of 7.3%.  He was taken off insulin last visit and put on Jardiance but shortly thereafter he developed rash, thus was changed to [place].  He admits he does not eat on routine pattern which contributes to his fluctuations.  He does have mild hypoglycemia noted as a result of skipping meals.  Analysis of his meter shows 7-day average of 170, 14-day average of 145, 30-day average of 153, 90-day average of 153.) An ACE inhibitor/angiotensin II receptor blocker is being taken. He sees a podiatrist.Eye exam is current.  Hyperlipidemia This is a chronic problem. The current episode started more than 1 year ago. The problem is controlled. Recent lipid tests were reviewed and are normal. Exacerbating diseases include chronic renal disease and diabetes. There are no known factors aggravating his hyperlipidemia. Pertinent negatives include no chest pain. Current antihyperlipidemic treatment includes statins. The current treatment provides moderate improvement of lipids. There are no compliance problems.  Risk factors for coronary artery disease include diabetes mellitus, dyslipidemia, hypertension and  male sex.  Hypertension This is a chronic problem. The current episode started more than 1 year ago. The problem has been gradually improving since onset. The problem is controlled. Associated symptoms include sweats. Pertinent  negatives include no blurred vision or chest pain. There are no associated agents to hypertension. Risk factors for coronary artery disease include diabetes mellitus, dyslipidemia and male gender. Past treatments include angiotensin blockers. The current treatment provides moderate improvement. There are no compliance problems.  Hypertensive end-organ damage includes kidney disease, heart failure and retinopathy. Identifiable causes of hypertension include chronic renal disease.    Review of systems  Constitutional: + Minimally fluctuating body weight,  current Body mass index is 38.09 kg/m. , no fatigue, no subjective hyperthermia, no subjective hypothermia Eyes: no blurry vision, no xerophthalmia ENT: no sore throat, no nodules palpated in throat, no dysphagia/odynophagia, no hoarseness Cardiovascular: no chest pain, no shortness of breath, no palpitations, no leg swelling Respiratory: no cough, no shortness of breath Gastrointestinal: no nausea/vomiting/diarrhea Musculoskeletal: no muscle/joint aches Skin: no rashes, no hyperemia Neurological: no tremors, no numbness, no tingling, no dizziness Psychiatric: no depression, no anxiety  Objective:     BP 139/78 (BP Location: Left Arm, Patient Position: Sitting, Cuff Size: Large)   Pulse 66   Ht 5\' 6"  (1.676 m)   Wt 236 lb (107 kg)   BMI 38.09 kg/m   Wt Readings from Last 3 Encounters:  02/02/23 236 lb (107 kg)  12/01/22 262 lb (118.8 kg)  10/06/22 229 lb (103.9 kg)     BP Readings from Last 3 Encounters:  02/02/23 139/78  12/01/22 115/72  10/06/22 120/68      Physical Exam- Limited  Constitutional:  Body mass index is 38.09 kg/m. , not in acute distress, normal state of mind Eyes:  EOMI, no  exophthalmos Musculoskeletal: no gross deformities, strength intact in all four extremities, no gross restriction of joint movements Skin:  no rashes, no hyperemia Neurological: no tremor with outstretched hands   Diabetic Foot Exam - Simple   No data filed     CMP ( most recent) CMP     Component Value Date/Time   NA 141 03/06/2022 1628   NA 141 04/15/2021 0826   K 4.6 03/06/2022 1628   CL 107 03/06/2022 1628   CO2 25 03/06/2022 1628   GLUCOSE 140 (H) 03/06/2022 1628   BUN 11 03/06/2022 1628   BUN 19 04/15/2021 0826   CREATININE 1.20 04/20/2022 0809   CREATININE 1.03 03/06/2022 1628   CALCIUM 9.5 03/06/2022 1628   PROT 7.9 03/06/2022 1628   PROT 7.2 04/15/2021 0826   ALBUMIN 4.2 04/15/2021 0826   AST 21 03/06/2022 1628   ALT 14 03/06/2022 1628   ALKPHOS 116 04/15/2021 0826   BILITOT 0.4 03/06/2022 1628   BILITOT 0.5 04/15/2021 0826   GFRNONAA >60 08/27/2021 1008   GFRNONAA 76 05/07/2020 0816   GFRAA 89 05/07/2020 0816     Diabetic Labs (most recent): Lab Results  Component Value Date   HGBA1C 7.3 (A) 10/06/2022   HGBA1C 7.8 (A) 06/02/2022   HGBA1C 7.5 (A) 01/27/2022   MICROALBUR 150 10/20/2021   MICROALBUR 7.9 05/07/2020   MICROALBUR 17.5 (H) 10/15/2017     Lipid Panel ( most recent) Lipid Panel     Component Value Date/Time   CHOL 154 04/15/2021 0826   TRIG 89 04/15/2021 0826   HDL 48 04/15/2021 0826   CHOLHDL 3.2 04/15/2021 0826   CHOLHDL 2.7 05/07/2020 0816   VLDL 33.0 10/15/2017 0818   LDLCALC 89 04/15/2021 0826   LDLCALC 65 05/07/2020 0816   LDLDIRECT 120.2 04/04/2013 0918   LABVLDL 17 04/15/2021 0826      Lab Results  Component  Value Date   TSH 1.490 04/15/2021   TSH 1.10 10/15/2017   TSH 1.63 04/28/2016   TSH 0.99 05/21/2014   TSH 1.43 04/04/2013   TSH 1.28 11/13/2011   TSH 0.77 03/29/2010   TSH 0.95 09/01/2009   TSH 0.71 02/08/2009   TSH 0.75 02/07/2008   FREET4 1.32 04/15/2021           Assessment & Plan:   1)  Controlled Type 2 Diabetes without complication  He presents today with his meter and logs showing dramatically fluctuating glycemic profile.  His POCT A1c today is 9%, increasing from last visit of 7.3%.  He was taken off insulin last visit and put on Jardiance but shortly thereafter he developed rash, thus was changed to [place].  He admits he does not eat on routine pattern which contributes to his fluctuations.  He does have mild hypoglycemia noted as a result of skipping meals.  Analysis of his meter shows 7-day average of 170, 14-day average of 145, 30-day average of 153, 90-day average of 153.  - Zykee K Brandenberger has currently uncontrolled symptomatic type 2 DM since 67 years of age.  -Recent labs reviewed.  - I had a long discussion with him about the progressive nature of diabetes and the pathology behind its complications. -his diabetes is complicated by retinopathy, CKD, neuropathy, recent TMA and he remains at a high risk for more acute and chronic complications which include CAD, CVA, CKD, retinopathy, and neuropathy. These are all discussed in detail with him.  - Nutritional counseling repeated at each appointment due to patients tendency to fall back in to old habits.  - The patient admits there is a room for improvement in their diet and drink choices. -  Suggestion is made for the patient to avoid simple carbohydrates from their diet including Cakes, Sweet Desserts / Pastries, Ice Cream, Soda (diet and regular), Sweet Tea, Candies, Chips, Cookies, Sweet Pastries, Store Bought Juices, Alcohol in Excess of 1-2 drinks a day, Artificial Sweeteners, Coffee Creamer, and "Sugar-free" Products. This will help patient to have stable blood glucose profile and potentially avoid unintended weight gain.   - I encouraged the patient to switch to unprocessed or minimally processed complex starch and increased protein intake (animal or plant source), fruits, and vegetables.   - Patient is  advised to stick to a routine mealtimes to eat 3 meals a day and avoid unnecessary snacks (to snack only to correct hypoglycemia).  - I have approached him with the following individualized plan to manage  his diabetes and patient agrees:   -He is advised to continue Tradjenta 5 mg po daily but will restart his premixed insulin 70/30 (he buys this OTC at [facility]) at 10 units twice daily if glucose is above 90 and he is eating.   -he is encouraged to continue monitoring blood glucose 2 times daily, before breakfast and before bed, and to call the clinic if he has readings less than 70 or greater than 300 for 3 tests in a row.  I approached him about trying a CGM to help monitor glucose since we are re-initiating treatment with insulin.  I sent for Dexcom G7 sensor to [facility].  I told him to reach out once he had the product and schedule nurse visit for assistance with application for the first time.  - he is warned not to take insulin without proper monitoring per orders. - Adjustment parameters are given to him for hypo and hyperglycemia in writing.  -  He did not tolerate GLP1- had significant GI symptoms.  He did not tolerate SGLT2i due to yeast infection in abdominal folds.  - Specific targets for  A1c;  LDL, HDL,  and Triglycerides were discussed with the patient.  2) Blood Pressure /Hypertension:  his blood pressure is controlled to target.   he is advised to continue his current medications including Cozaar 50 mg p.o. daily with breakfast.    3) Lipids/Hyperlipidemia:    Review of his recent lipid panel from 04/15/21 showed controlled  LDL at 89 .  he  is advised to continue Lipitor 10 mg daily at bedtime.  Side effects and precautions discussed with him.   4)  Weight/Diet:  his Body mass index is 38.09 kg/m.  -   clearly complicating his diabetes care.   he is a candidate for weight loss. I discussed with him the fact that loss of 5 - 10% of his  current body weight will have the most  impact on his diabetes management.  Exercise, and detailed carbohydrates information provided  -  detailed on discharge instructions.  5) Chronic Care/Health Maintenance: -he is on ACEI/ARB and Statin medications and is encouraged to initiate and continue to follow up with Ophthalmology, Dentist, Podiatrist at least yearly or according to recommendations, and advised to stay away from smoking. I have recommended yearly flu vaccine and pneumonia vaccine at least every 5 years; moderate intensity exercise for up to 150 minutes weekly; and sleep for at least 7 hours a day.  - he is advised to maintain close follow up with Donita Brooks, MD for primary care needs, as well as his other providers for optimal and coordinated care.     I spent  41  minutes in the care of the patient today including review of labs from CMP, Lipids, Thyroid Function, Hematology (current and previous including abstractions from other facilities); face-to-face time discussing  his blood glucose readings/logs, discussing hypoglycemia and hyperglycemia episodes and symptoms, medications doses, his options of short and long term treatment based on the latest standards of care / guidelines;  discussion about incorporating lifestyle medicine;  and documenting the encounter. Risk reduction counseling performed per USPSTF guidelines to reduce obesity and cardiovascular risk factors.     Please refer to Patient Instructions for Blood Glucose Monitoring and Insulin/Medications Dosing Guide"  in media tab for additional information. Please  also refer to " Patient Self Inventory" in the Media  tab for reviewed elements of pertinent patient history.  Keontre K Kist participated in the discussions, expressed understanding, and voiced agreement with the above plans.  All questions were answered to his satisfaction. he is encouraged to contact clinic should he have any questions or concerns prior to his return visit.   Follow up  plan: - No follow-ups on file.   Ronny Bacon, [facility] [address] [place], [place] [address] Phone: [phone] Fax: [phone]  02/02/2023, 8:37 AM

## 2023-02-06 ENCOUNTER — Ambulatory Visit: Payer: Medicare HMO | Admitting: Nurse Practitioner

## 2023-02-15 ENCOUNTER — Ambulatory Visit: Payer: Medicare HMO | Admitting: *Deleted

## 2023-02-15 DIAGNOSIS — Z0000 Encounter for general adult medical examination without abnormal findings: Secondary | ICD-10-CM

## 2023-02-15 NOTE — Progress Notes (Signed)
Subjective:   Aaron Fox is a 67 y.o. male who presents for Medicare Annual/Subsequent preventive examination.  Visit Complete: Virtual I connected with  [name] on 02/15/23 by a audio enabled telemedicine application and verified that I am speaking with the correct person using two identifiers.  Patient Location: Home  Provider Location: Home Office  I discussed the limitations of evaluation and management by telemedicine. The patient expressed understanding and agreed to proceed.  Vital Signs: Because this visit was a virtual/telehealth visit, some criteria may be missing or patient reported. Any vitals not documented were not able to be obtained and vitals that have been documented are patient reported.   Cardiac Risk Factors include: advanced age (>35men, >102 women);diabetes mellitus;male gender;hypertension     Objective:    There were no vitals filed for this visit. There is no height or weight on file to calculate BMI.     02/15/2023   11:05 AM 02/07/2022   10:18 AM 08/27/2021   10:05 AM 08/27/2020    8:27 AM 04/25/2019    6:45 PM 04/25/2019   11:30 AM 04/24/2019    2:41 PM  Advanced Directives  Does Patient Have a Medical Advance Directive? No No No No No No No  Would patient like information on creating a medical advance directive? No - Patient declined Yes (MAU/Ambulatory/Procedural Areas - Information given) No - Patient declined No - Patient declined No - Patient declined No - Patient declined No - Patient declined    Current Medications (verified) Outpatient Encounter Medications as of 02/15/2023  Medication Sig   Accu-Chek Softclix Lancets lancets TEST BLOOD SUGAR 7 TIMES DAILY   Alcohol Swabs (B-D SINGLE USE SWABS REGULAR) PADS Use to cleanse site prior to use of lancet to obtain droplet of blood two times per day; E10.49   aspirin 81 MG tablet Take 81 mg by mouth daily.   atorvastatin (LIPITOR) 10 MG tablet TAKE 1 TABLET EVERY DAY   Blood  Glucose Monitoring Suppl (ACCU-CHEK GUIDE ME) w/Device KIT 1 each by Does not apply route See admin instructions. Use to monitor glucose levels 7 times daily; E11.9; WILL NOT COMPLETE PA   Continuous Glucose Sensor (DEXCOM G7 SENSOR) MISC Inject 1 Application into the skin as directed. Change sensor every 10 days as directed.   gabapentin (NEURONTIN) 300 MG capsule TAKE 2 CAPSULES THREE TIMES DAILY   glucose blood (ACCU-CHEK GUIDE TEST) test strip Use as instructed to monitor glucose 4 times daily   HYDROcodone bit-homatropine (HYCODAN) 5-1.5 MG/5ML syrup Take 5 mLs by mouth every 8 (eight) hours as needed for cough.   insulin isophane & regular human KwikPen (NOVOLIN 70/30 KWIKPEN) (70-30) 100 UNIT/ML KwikPen Inject 10 Units into the skin 2 (two) times daily before a meal.   INSULIN SYRINGE 1CC/29G (DROPLET INSULIN SYRINGE) 29G X 1/2" 1 ML MISC Use to inject in morning and bedtime   linagliptin (TRADJENTA) 5 MG TABS tablet Take 1 tablet (5 mg total) by mouth daily.   losartan (COZAAR) 50 MG tablet TAKE 1 TABLET EVERY DAY   meloxicam (MOBIC) 15 MG tablet Take 1 tablet (15 mg total) by mouth daily.   mupirocin ointment (BACTROBAN) 2 % Apply 1 application topically 2 (two) times daily.   Tetrahydrozoline HCl (VISINE OP) Place 1 drop into both eyes daily as needed (redness).   triamcinolone cream (KENALOG) 0.1 % APPLY TO THE AFFECTED AREA(S) TOPICALLY THREE TIMES DAILY AS NEEDED FOR RASH   No facility-administered encounter medications on  file as of 02/15/2023.    Allergies (verified) Penicillins, Ciprofloxacin, Terbinafine hcl, and Zestril [lisinopril]   History: Past Medical History:  Diagnosis Date   ALLERGIC RHINITIS 07/02/2008   Allergy    Arthritis    RA   Atrial fibrillation (HCC)    Cataract    removed both eyes    CHF 06/19/2008   COUGH DUE TO ACE INHIBITORS 10/05/2008   DIABETES MELLITUS, TYPE II 09/22/2006   Diabetic retinopathy of both eyes (HCC)    mild nonproliferative   ED  (erectile dysfunction)    Glaucoma    GOUT 09/22/2006   Hx of adenomatous colonic polyps 06/09/2015   HYPERCHOLESTEROLEMIA 07/02/2008   HYPERTENSION 05/31/2009   HYPOGONADISM, MALE 01/15/2007   Hypokalemia    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 Vonna Kotyk   CATARACT EXTRACTION, BILATERAL     COLONOSCOPY  2004   LYMPH NODE BIOPSY Right 10/16/2014   Procedure: RIGHT INGUINAL LYMPH NODE BIOPSY;  Surgeon: Franky Macho, MD;  Location: [facility];  Service: General;  Laterality: Right;   Family History  Problem Relation Age of Onset   Heart disease Other        FH of CAD   Colon polyps Paternal Uncle    Kidney disease Paternal Uncle    Diabetes Paternal Grandfather    Diabetes Maternal Uncle    Cancer Neg Hx    Colon cancer Neg Hx    Gallbladder disease Neg Hx    Esophageal cancer Neg Hx    Rectal cancer Neg Hx    Stomach cancer Neg Hx    Social History   Socioeconomic History   Marital status: Married    Spouse name: Not on file   Number of children: 3   Years of education: Not on file   Highest education level: Not on file  Occupational History   Occupation: Disability    Employer: [employer]  Tobacco Use   Smoking status: Former    Current packs/day: 0.00    Types: Cigarettes    Quit date: 11/02/1999    Years since quitting: 23.3   Smokeless tobacco: Never  Vaping Use   Vaping status: Never Used  Substance and Sexual Activity    Alcohol use: No    Alcohol/week: 0.0 standard drinks of alcohol   Drug use: No   Sexual activity: Not Currently  Other Topics Concern   Not on file  Social History Narrative   ** Merged History Encounter ** Married, 3 kids   Prior work [employer] before disability   Regular exercise-yes   Works some doing Aeronautical engineer    Social Drivers of Corporate investment banker Strain: Low Risk  (02/15/2023)   Overall Financial Resource Strain (CARDIA)    Difficulty of Paying Living Expenses: Not hard at all  Food Insecurity: No Food Insecurity (02/15/2023)   Hunger Vital Sign    Worried About Running Out of Food in the Last Year: Never true  Ran Out of Food in the Last Year: Never true  Transportation Needs: No Transportation Needs (02/15/2023)   PRAPARE - Administrator, Civil Service (Medical): No    Lack of Transportation (Non-Medical): No  Physical Activity: Sufficiently Active (02/15/2023)   Exercise Vital Sign    Days of Exercise per Week: 5 days    Minutes of Exercise per Session: 80 min  Stress: No Stress Concern Present (02/15/2023)   [employer] of Occupational Health - Occupational Stress Questionnaire    Feeling of Stress : Not at all  Social Connections: Moderately Integrated (02/15/2023)   Social Connection and Isolation Panel [NHANES]    Frequency of Communication with Friends and Family: More than three times a week    Frequency of Social Gatherings with Friends and Family: Twice a week    Attends Religious Services: More than 4 times per year    Active Member of [employer] or Organizations: No    Attends Engineer, structural: Never    Marital Status: Married    Tobacco Counseling Counseling given: Not Answered   Clinical Intake:  Pre-visit preparation completed: Yes  Pain : No/denies pain     Diabetes: Yes CBG done?: No Did pt. bring in CBG monitor from home?: No  How often do you need to have someone help you when you read instructions,  pamphlets, or other written materials from your doctor or pharmacy?: 1 - Never  Interpreter Needed?: No  Information entered by :: Remi Haggard LPN   Activities of Daily Living    02/15/2023   11:10 AM  In your present state of health, do you have any difficulty performing the following activities:  Hearing? 0  Vision? 0  Difficulty concentrating or making decisions? 0  Walking or climbing stairs? 0  Dressing or bathing? 0  Doing errands, shopping? 0  Preparing Food and eating ? N  Using the Toilet? N  In the past six months, have you accidently leaked urine? N  Do you have problems with loss of bowel control? N  Managing your Medications? N  Managing your Finances? N  Housekeeping or managing your Housekeeping? N    Patient Care Team: Donita Brooks, MD as PCP - General (Family Medicine) Erroll Luna, [facility] (Inactive) as Pharmacist (Pharmacist)  Indicate any recent Medical Services you may have received from other than [facility] providers in the past year (date may be approximate).     Assessment:   This is a routine wellness examination for Aaron Fox.  Hearing/Vision screen Hearing Screening - Comments:: No trouble hearing Vision Screening - Comments:: Up to date Looking or a new one   Goals Addressed             This Visit's Progress    Exercise 150 min/wk Moderate Activity   On track    Patient Stated       Would like to eat healthier        Depression Screen    02/15/2023   11:10 AM 02/07/2022   10:17 AM 01/27/2022    1:29 PM 01/27/2022    1:27 PM 08/27/2020    8:29 AM 05/07/2020    7:58 AM  PHQ 2/9 Scores  PHQ - 2 Score 0 0 0 0 0 0  PHQ- 9 Score 0         Fall Risk    02/15/2023   11:05 AM 02/07/2022   10:15 AM 08/27/2020    8:29 AM 05/07/2020  7:58 AM  Fall Risk   Falls in the past year? 0 1 0 0  Number falls in past yr: 0 0 0   Injury with Fall? 0 0 0   Risk for fall due to :  History of fall(s) No Fall Risks No Fall Risks  Follow up  Falls evaluation completed;Education provided;Falls prevention discussed Falls evaluation completed;Education provided;Falls prevention discussed Falls evaluation completed;Falls prevention discussed Falls evaluation completed    MEDICARE RISK AT HOME: Medicare Risk at Home Any stairs in or around the home?: No If so, are there any without handrails?: No Home free of loose throw rugs in walkways, pet beds, electrical cords, etc?: Yes Adequate lighting in your home to reduce risk of falls?: Yes Life alert?: No Use of a cane, walker or w/c?: No Grab bars in the bathroom?: Yes Shower chair or bench in shower?: Yes Elevated toilet seat or a handicapped toilet?: No  TIMED UP AND GO:  Was the test performed?  No    Cognitive Function:        02/15/2023   11:07 AM 02/07/2022   10:18 AM  6CIT Screen  What Year? 0 points 0 points  What month? 0 points 0 points  What time? 0 points 0 points  Count back from 20 0 points 0 points  Months in reverse 0 points 0 points  Repeat phrase 0 points 0 points  Total Score 0 points 0 points    Immunizations Immunization History  Administered Date(s) Administered   Fluad Quad(high Dose 65+) 03/03/2022   Fluad Trivalent(High Dose 65+) 01/12/2023   Influenza Whole 12/28/2008   Influenza,inj,Quad PF,6+ Mos 11/21/2013, 04/01/2015, 12/20/2015, 01/08/2017, 12/17/2017, 10/25/2018, 12/19/2019, 03/28/2021   Moderna Sars-Covid-2 Vaccination 04/29/2019, 05/27/2019, 12/29/2019   Pneumococcal Polysaccharide-23 06/11/2008   Td 09/29/2005   Tdap 10/15/2017   Zoster Recombinant(Shingrix) 06/14/2020, 08/25/2020    TDAP status: Up to date  Flu Vaccine status: Up to date  Pneumococcal vaccine status: Due, Education has been provided regarding the importance of this vaccine. Advised may receive this vaccine at local pharmacy or Health Dept. Aware to provide a copy of the vaccination record if obtained from local pharmacy or Health Dept. Verbalized  acceptance and understanding.  Covid-19 vaccine status: Declined, Education has been provided regarding the importance of this vaccine but patient still declined. Advised may receive this vaccine at local pharmacy or Health Dept.or vaccine clinic. Aware to provide a copy of the vaccination record if obtained from local pharmacy or Health Dept. Verbalized acceptance and understanding.  Qualifies for Shingles Vaccine? Yes   Zostavax completed No   Shingrix Completed?: Yes  Screening Tests Health Maintenance  Topic Date Due   Diabetic kidney evaluation - Urine ACR  10/16/2018   COVID-19 Vaccine (4 - 2024-25 season) 10/29/2022   Diabetic kidney evaluation - eGFR measurement  03/07/2023   OPHTHALMOLOGY EXAM  02/15/2023 (Originally 08/06/2022)   Pneumonia Vaccine 4+ Years old (2 of 2 - PCV) 02/15/2024 (Originally 06/11/2009)   HEMOGLOBIN A1C  08/03/2023   Colonoscopy  11/13/2023   Medicare Annual Wellness (AWV)  02/15/2024   DTaP/Tdap/Td (3 - Td or Tdap) 10/16/2027   INFLUENZA VACCINE  Completed   Hepatitis C Screening  Completed   Zoster Vaccines- Shingrix  Completed   HPV VACCINES  Aged Out   FOOT EXAM  Discontinued    Health Maintenance  Health Maintenance Due  Topic Date Due   Diabetic kidney evaluation - Urine ACR  10/16/2018   COVID-19 Vaccine (4 -  2024-25 season) 10/29/2022   Diabetic kidney evaluation - eGFR measurement  03/07/2023    Colorectal cancer screening: Type of screening: Colonoscopy. Completed 2020. Repeat every 5 years  Lung Cancer Screening: (Low Dose CT Chest recommended if Age 61-80 years, 20 pack-year currently smoking OR have quit w/in 15years.) does not qualify.   Lung Cancer Screening Referral:   Additional Screening:  Hepatitis C Screening:Completed  2022  Vision Screening: Recommended annual ophthalmology exams for early detection of glaucoma and other disorders of the eye. Is the patient up to date with their annual eye exam?  No  Who is the  provider or what is the name of the office in which the patient attends annual eye exams? Education provided If pt is not established with a provider, would they like to be referred to a provider to establish care? No .   Dental Screening: Recommended annual dental exams for proper oral hygiene  Nutrition Risk Assessment:  Has the patient had any N/V/D within the last 2 months?  No  Does the patient have any non-healing wounds?  No  Has the patient had any unintentional weight loss or weight gain?  Yes   Diabetes:  Is the patient diabetic?  Yes  If diabetic, was a CBG obtained today?  No  Did the patient bring in their glucometer from home?  No  How often do you monitor your CBG's? 3 times a day.   Financial Strains and Diabetes Management:  Are you having any financial strains with the device, your supplies or your medication? No .  Does the patient want to be seen by Chronic Care Management for management of their diabetes?  No  Would the patient like to be referred to a Nutritionist or for Diabetic Management?  No   Diabetic Exams:  Diabetic Eye Exam:. Overdue for diabetic eye exam. Pt has been advised about the importance in completing this exam. . Message sent to referral coordinator for scheduling purposes.  Diabetic Foot Exam: . Pt has been advised about the importance in completing this exam.  Community Resource Referral / Chronic Care Management: CRR required this visit?  No   CCM required this visit?  No     Plan:     I have personally reviewed and noted the following in the patient's chart:   Medical and social history Use of alcohol, tobacco or illicit drugs  Current medications and supplements including opioid prescriptions. Patient is not currently taking opioid prescriptions. Functional ability and status Nutritional status Physical activity Advanced directives List of other physicians Hospitalizations, surgeries, and ER visits in previous 12  months Vitals Screenings to include cognitive, depression, and falls Referrals and appointments  In addition, I have reviewed and discussed with patient certain preventive protocols, quality metrics, and best practice recommendations. A written personalized care plan for preventive services as well as general preventive health recommendations were provided to patient.     Remi Haggard, LPN   [id number]   After Visit Summary: (MyChart) Due to this being a telephonic visit, the after visit summary with patients personalized plan was offered to patient via MyChart   Nurse Notes:

## 2023-02-15 NOTE — Patient Instructions (Signed)
[name] , Thank you for taking time to come for your Medicare Wellness Visit. I appreciate your ongoing commitment to your health goals. Please review the following plan we discussed and let me know if I can assist you in the future.   Screening recommendations/referrals: Colonoscopy: up to date Recommended yearly ophthalmology/optometry visit for glaucoma screening and checkup Recommended yearly dental visit for hygiene and checkup  Vaccinations: Influenza vaccine: up to date Pneumococcal vaccine: Education provided Tdap vaccine: up to date Shingles vaccine: up to date    Advanced directives: Education provided      Preventive Care 65 Years and Older, Male Preventive care refers to lifestyle choices and visits with your health care provider that can promote health and wellness. What does preventive care include? A yearly physical exam. This is also called an annual well check. Dental exams once or twice a year. Routine eye exams. Ask your health care provider how often you should have your eyes checked. Personal lifestyle choices, including: Daily care of your teeth and gums. Regular physical activity. Eating a healthy diet. Avoiding tobacco and drug use. Limiting alcohol use. Practicing safe sex. Taking low doses of aspirin every day. Taking vitamin and mineral supplements as recommended by your health care provider. What happens during an annual well check? The services and screenings done by your health care provider during your annual well check will depend on your age, overall health, lifestyle risk factors, and family history of disease. Counseling  Your health care provider may ask you questions about your: Alcohol use. Tobacco use. Drug use. Emotional well-being. Home and relationship well-being. Sexual activity. Eating habits. History of falls. Memory and ability to understand (cognition). Work and work Astronomer. Screening  You may have the  following tests or measurements: Height, weight, and BMI. Blood pressure. Lipid and cholesterol levels. These may be checked every 5 years, or more frequently if you are over 19 years old. Skin check. Lung cancer screening. You may have this screening every year starting at age 13 if you have a 30-pack-year history of smoking and currently smoke or have quit within the past 15 years. Fecal occult blood test (FOBT) of the stool. You may have this test every year starting at age 67. Flexible sigmoidoscopy or colonoscopy. You may have a sigmoidoscopy every 5 years or a colonoscopy every 10 years starting at age 24. Prostate cancer screening. Recommendations will vary depending on your family history and other risks. Hepatitis C blood test. Hepatitis B blood test. Sexually transmitted disease (STD) testing. Diabetes screening. This is done by checking your blood sugar (glucose) after you have not eaten for a while (fasting). You may have this done every 1-3 years. Abdominal aortic aneurysm (AAA) screening. You may need this if you are a current or former smoker. Osteoporosis. You may be screened starting at age 68 if you are at high risk. Talk with your health care provider about your test results, treatment options, and if necessary, the need for more tests. Vaccines  Your health care provider may recommend certain vaccines, such as: Influenza vaccine. This is recommended every year. Tetanus, diphtheria, and acellular pertussis (Tdap, Td) vaccine. You may need a Td booster every 10 years. Zoster vaccine. You may need this after age 60. Pneumococcal 13-valent conjugate (PCV13) vaccine. One dose is recommended after age 17. Pneumococcal polysaccharide (PPSV23) vaccine. One dose is recommended after age 63. Talk to your health care provider about which screenings and vaccines you need and how often you need  them. This information is not intended to replace advice given to you by your health care  provider. Make sure you discuss any questions you have with your health care provider. Document Released: 03/12/2015 Document Revised: 11/03/2015 Document Reviewed: 12/15/2014 Elsevier Interactive Patient Education  2017 [employer].  Fall Prevention in the Home Falls can cause injuries. They can happen to people of all ages. There are many things you can do to make your home safe and to help prevent falls. What can I do on the outside of my home? Regularly fix the edges of walkways and driveways and fix any cracks. Remove anything that might make you trip as you walk through a door, such as a raised step or threshold. Trim any bushes or trees on the path to your home. Use bright outdoor lighting. Clear any walking paths of anything that might make someone trip, such as rocks or tools. Regularly check to see if handrails are loose or broken. Make sure that both sides of any steps have handrails. Any raised decks and porches should have guardrails on the edges. Have any leaves, snow, or ice cleared regularly. Use sand or salt on walking paths during winter. Clean up any spills in your garage right away. This includes oil or grease spills. What can I do in the bathroom? Use night lights. Install grab bars by the toilet and in the tub and shower. Do not use towel bars as grab bars. Use non-skid mats or decals in the tub or shower. If you need to sit down in the shower, use a plastic, non-slip stool. Keep the floor dry. Clean up any water that spills on the floor as soon as it happens. Remove soap buildup in the tub or shower regularly. Attach bath mats securely with double-sided non-slip rug tape. Do not have throw rugs and other things on the floor that can make you trip. What can I do in the bedroom? Use night lights. Make sure that you have a light by your bed that is easy to reach. Do not use any sheets or blankets that are too big for your bed. They should not hang down onto the  floor. Have a firm chair that has side arms. You can use this for support while you get dressed. Do not have throw rugs and other things on the floor that can make you trip. What can I do in the kitchen? Clean up any spills right away. Avoid walking on wet floors. Keep items that you use a lot in easy-to-reach places. If you need to reach something above you, use a strong step stool that has a grab bar. Keep electrical cords out of the way. Do not use floor polish or wax that makes floors slippery. If you must use wax, use non-skid floor wax. Do not have throw rugs and other things on the floor that can make you trip. What can I do with my stairs? Do not leave any items on the stairs. Make sure that there are handrails on both sides of the stairs and use them. Fix handrails that are broken or loose. Make sure that handrails are as long as the stairways. Check any carpeting to make sure that it is firmly attached to the stairs. Fix any carpet that is loose or worn. Avoid having throw rugs at the top or bottom of the stairs. If you do have throw rugs, attach them to the floor with carpet tape. Make sure that you have a light switch at  the top of the stairs and the bottom of the stairs. If you do not have them, ask someone to add them for you. What else can I do to help prevent falls? Wear shoes that: Do not have high heels. Have rubber bottoms. Are comfortable and fit you well. Are closed at the toe. Do not wear sandals. If you use a stepladder: Make sure that it is fully opened. Do not climb a closed stepladder. Make sure that both sides of the stepladder are locked into place. Ask someone to hold it for you, if possible. Clearly mark and make sure that you can see: Any grab bars or handrails. First and last steps. Where the edge of each step is. Use tools that help you move around (mobility aids) if they are needed. These include: Canes. Walkers. Scooters. Crutches. Turn on the  lights when you go into a dark area. Replace any light bulbs as soon as they burn out. Set up your furniture so you have a clear path. Avoid moving your furniture around. If any of your floors are uneven, fix them. If there are any pets around you, be aware of where they are. Review your medicines with your doctor. Some medicines can make you feel dizzy. This can increase your chance of falling. Ask your doctor what other things that you can do to help prevent falls. This information is not intended to replace advice given to you by your health care provider. Make sure you discuss any questions you have with your health care provider. Document Released: 12/10/2008 Document Revised: 07/22/2015 Document Reviewed: 03/20/2014 Elsevier Interactive Patient Education  2017 [employer].

## 2023-02-26 ENCOUNTER — Telehealth: Payer: Self-pay | Admitting: *Deleted

## 2023-02-26 NOTE — Telephone Encounter (Signed)
He can just stick to fingersticks for now.  With his insulin, he will need to check at least 3 times per day (before injecting insulin at breakfast and supper, and before bed).

## 2023-02-26 NOTE — Telephone Encounter (Signed)
Patient was called and made aware. 

## 2023-02-26 NOTE — Telephone Encounter (Signed)
Patient left a message that he had checked into the CGM's and what his part would be and he cannot afford it. He ask to make Whitney aware and see if there are any other recommendations.

## 2023-04-27 ENCOUNTER — Ambulatory Visit: Payer: Medicare HMO | Admitting: Podiatry

## 2023-04-27 DIAGNOSIS — M79675 Pain in left toe(s): Secondary | ICD-10-CM | POA: Diagnosis not present

## 2023-04-27 DIAGNOSIS — B351 Tinea unguium: Secondary | ICD-10-CM | POA: Diagnosis not present

## 2023-04-27 DIAGNOSIS — E1149 Type 2 diabetes mellitus with other diabetic neurological complication: Secondary | ICD-10-CM | POA: Diagnosis not present

## 2023-04-27 NOTE — Progress Notes (Signed)
  Subjective: Chief Complaint  Patient presents with   [facility]    RM#[id number] DFC   68 year old male presents with the above concerns. No open lesions. He has been using moisturizer on the feet daily. No open lesions.   Last A1c was 9 on 02/02/2023.   Objective: AAO x3, NAD DP/PT pulses palpable bilaterally, CRT less than 3 seconds Sensation decreased with SWMF Nails are hypertrophic, dystrophic, brittle, discolored, elongated 10. No surrounding redness or drainage. Tenderness nails 1-5 on left. TMA on right  Dry skin present without any skin breakdown or ulceration bilaterally.  No pain with calf compression, swelling, warmth, erythema  Assessment: Symptomatic onychomycosis, per-ulcerative callus  Plan: -All treatment options discussed with the patient including all alternatives, risks, complications.  -Nails sharply debrided x 5 without complications or bleeding -Daily foot inspection, glucose control  -Patient encouraged to call the office with any questions, concerns, change in symptoms.   Vivi Barrack DPM

## 2023-05-09 ENCOUNTER — Other Ambulatory Visit: Payer: Self-pay | Admitting: Family Medicine

## 2023-05-11 ENCOUNTER — Encounter: Payer: Self-pay | Admitting: Nurse Practitioner

## 2023-05-11 ENCOUNTER — Ambulatory Visit: Payer: Medicare HMO | Admitting: Nurse Practitioner

## 2023-05-11 VITALS — BP 134/78 | HR 60 | Ht 71.5 in | Wt 239.2 lb

## 2023-05-11 DIAGNOSIS — Z794 Long term (current) use of insulin: Secondary | ICD-10-CM

## 2023-05-11 DIAGNOSIS — E782 Mixed hyperlipidemia: Secondary | ICD-10-CM | POA: Diagnosis not present

## 2023-05-11 DIAGNOSIS — I10 Essential (primary) hypertension: Secondary | ICD-10-CM | POA: Diagnosis not present

## 2023-05-11 DIAGNOSIS — E119 Type 2 diabetes mellitus without complications: Secondary | ICD-10-CM | POA: Diagnosis not present

## 2023-05-11 LAB — POCT GLYCOSYLATED HEMOGLOBIN (HGB A1C): Hemoglobin A1C: 7.4 % — AB (ref 4.0–5.6)

## 2023-05-11 NOTE — Progress Notes (Signed)
 Endocrinology Follow Up Note       05/11/2023, 8:11 AM   Subjective:    Patient ID: Aaron Fox, male    DOB: [birth year].  Aaron Fox is being seen in follow up after being seen in consultation for management of currently uncontrolled symptomatic diabetes requested by  Donita Brooks, MD.   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 Vonna Kotyk   CATARACT EXTRACTION, BILATERAL     COLONOSCOPY  2004   LYMPH NODE BIOPSY Right 10/16/2014   Procedure: RIGHT INGUINAL LYMPH NODE BIOPSY;  Surgeon: Franky Macho, MD;  Location: [facility];  Service: General;  Laterality: Right;    Social History   Socioeconomic History   Marital status: Married    Spouse name: Not on file   Number of children: 3   Years of education: Not on file   Highest  education level: Not on file  Occupational History   Occupation: Disability    Employer: [employer]  Tobacco Use   Smoking status: Former    Current packs/day: 0.00    Types: Cigarettes    Quit date: 11/02/1999    Years since quitting: 23.5   Smokeless tobacco: Never  Vaping Use   Vaping status: Never Used  Substance and Sexual Activity   Alcohol use: No    Alcohol/week: 0.0 standard drinks of alcohol   Drug use: No   Sexual activity: Not Currently  Other Topics Concern   Not on file  Social History Narrative   ** Merged History Encounter ** Married, 3 kids   Prior work [employer] before disability   Regular exercise-yes   Works some doing Aeronautical engineer    Social Drivers of Corporate investment banker Strain: Low Risk  (02/15/2023)   Overall Financial Resource Strain (CARDIA)    Difficulty of Paying Living Expenses: Not hard at all  Food Insecurity: No Food Insecurity (02/15/2023)   Hunger Vital Sign    Worried About Running Out of Food in the  Last Year: Never true    Ran Out of Food in the Last Year: Never true  Transportation Needs: No Transportation Needs (02/15/2023)   PRAPARE - Administrator, Civil Service (Medical): No    Lack of Transportation (Non-Medical): No  Physical Activity: Sufficiently Active (02/15/2023)   Exercise Vital Sign    Days of Exercise per Week: 5 days    Minutes of Exercise per Session: 80 min  Stress: No Stress Concern Present (02/15/2023)   [employer] of Occupational Health - Occupational Stress Questionnaire    Feeling of Stress : Not at all  Social Connections: Moderately Integrated (02/15/2023)   Social Connection and Isolation Panel [NHANES]    Frequency of Communication with Friends and Family: More than three times a week    Frequency of Social Gatherings with Friends and Family: Twice a week    Attends Religious Services: More than 4 times per year    Active Member of [employer] or Organizations: No    Attends Museum/gallery exhibitions officer: Never    Marital Status: Married    Family History  Problem Relation Age of Onset   Heart disease Other        FH of CAD   Colon polyps Paternal Uncle    Kidney disease Paternal Uncle    Diabetes Paternal Grandfather    Diabetes Maternal Uncle    Cancer Neg Hx    Colon cancer Neg Hx    Gallbladder disease Neg Hx    Esophageal cancer Neg Hx    Rectal cancer Neg Hx    Stomach cancer Neg Hx     Outpatient Encounter Medications as of 05/11/2023  Medication Sig   Accu-Chek Softclix Lancets lancets TEST BLOOD SUGAR 7 TIMES DAILY   Alcohol Swabs (B-D SINGLE USE SWABS REGULAR) PADS Use to cleanse site prior to use of lancet to obtain droplet of blood two times per day; E10.49   aspirin 81 MG tablet Take 81 mg by mouth daily.   atorvastatin (LIPITOR) 10 MG tablet TAKE 1 TABLET EVERY DAY   Blood Glucose Monitoring Suppl (ACCU-CHEK GUIDE ME) w/Device KIT 1 each by Does not apply route See admin instructions. Use to monitor glucose levels 7 times daily; E11.9; WILL NOT COMPLETE PA   gabapentin (NEURONTIN) 300 MG capsule TAKE 2 CAPSULES THREE TIMES DAILY   glucose blood (ACCU-CHEK GUIDE TEST) test strip Use as instructed to monitor glucose 4 times daily   HYDROcodone bit-homatropine (HYCODAN) 5-1.5 MG/5ML syrup Take 5 mLs by mouth every 8 (eight) hours as needed for cough.   insulin isophane & regular human KwikPen (NOVOLIN 70/30 KWIKPEN) (70-30) 100 UNIT/ML KwikPen Inject 10 Units into the skin 2 (two) times daily before a meal.   INSULIN SYRINGE 1CC/29G (DROPLET INSULIN SYRINGE) 29G X 1/2" 1 ML MISC Use to inject in morning and bedtime   linagliptin (TRADJENTA) 5 MG TABS tablet Take 1 tablet (5 mg total) by mouth daily.   losartan (COZAAR) 50 MG tablet TAKE 1 TABLET EVERY DAY   meloxicam (MOBIC) 15 MG tablet Take 1 tablet (15 mg total) by mouth daily.   mupirocin ointment (BACTROBAN) 2 % Apply 1 application topically 2 (two) times daily.   Tetrahydrozoline HCl (VISINE  OP) Place 1 drop into both eyes daily as needed (redness).   triamcinolone cream (KENALOG) 0.1 % APPLY TO THE AFFECTED AREA(S) TOPICALLY THREE TIMES DAILY AS NEEDED FOR RASH   Continuous Glucose Sensor (DEXCOM G7 SENSOR) MISC Inject 1 Application  into the skin as directed. Change sensor every 10 days as directed. (Patient not taking: Reported on 05/11/2023)   No facility-administered encounter medications on file as of 05/11/2023.    ALLERGIES: Allergies  Allergen Reactions   Penicillins Swelling    Swelling of the hands and feet, skin peeling Did it involve swelling of the face/tongue/throat, SOB, or low BP? No Did it involve sudden or severe rash/hives, skin peeling, or any reaction on the inside of your mouth or nose? No Did you need to seek medical attention at a hospital or doctor's office? Yes When did it last happen?      10 + years If all above answers are "NO", may proceed with cephalosporin use.    Ciprofloxacin Hives and Rash   Terbinafine Hcl Hives   Zestril [Lisinopril] Cough    VACCINATION STATUS: Immunization History  Administered Date(s) Administered   Fluad Quad(high Dose 65+) 03/03/2022   Fluad Trivalent(High Dose 65+) 01/12/2023   Influenza Whole 12/28/2008   Influenza,inj,Quad PF,6+ Mos 11/21/2013, 04/01/2015, 12/20/2015, 01/08/2017, 12/17/2017, 10/25/2018, 12/19/2019, 03/28/2021   Moderna Sars-Covid-2 Vaccination 04/29/2019, 05/27/2019, 12/29/2019   Pneumococcal Polysaccharide-23 06/11/2008   Td 09/29/2005   Tdap 10/15/2017   Zoster Recombinant(Shingrix) 06/14/2020, 08/25/2020    Diabetes He presents for his follow-up diabetic visit. He has type 2 diabetes mellitus. Onset time: was diagnosed at approx age of 87. His disease course has been improving. There are no hypoglycemic associated symptoms. Associated symptoms include foot paresthesias. Pertinent negatives for diabetes include no blurred vision, no chest pain, no fatigue, no polydipsia, no polyphagia, no  polyuria and no weight loss. There are no hypoglycemic complications. Symptoms are stable. Diabetic complications include heart disease, impotence, nephropathy, peripheral neuropathy and retinopathy. 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 Current diabetic treatment includes oral agent (monotherapy) and insulin injections. He is compliant with treatment most of the time. His weight is fluctuating minimally. He is following a generally healthy diet. When asked about meal planning, he reported none. He has not had a previous visit with a dietitian. He participates in exercise three times a week. His home blood glucose trend is decreasing steadily. His overall blood glucose range is 110-130 mg/dl. (He presents today with his logs showing at target glycemic profile overall.  His POCT A1c today is 7.4%, improving from last visit of 9%.  He does have occasional drops in glucose likely due to meal timing being off.  Analysis of his meter shows 7-day average of 126, 14-day average of 118, 30-day average of 130, 90-day average of 136.  He also notes he had the Flu since last visit as well and noticed sugar readings were higher during that time.) An ACE inhibitor/angiotensin II receptor blocker is being taken. He sees a podiatrist.Eye exam is current.  Hyperlipidemia This is a chronic problem. The current episode started more than 1 year ago. The problem is controlled. Recent lipid tests were reviewed and are normal. Exacerbating diseases include chronic renal disease and diabetes. There are no known factors aggravating his hyperlipidemia. Pertinent negatives include no chest pain. Current antihyperlipidemic treatment includes statins. The current treatment provides moderate improvement of lipids. There are no compliance problems.  Risk factors for coronary artery disease include diabetes  mellitus, dyslipidemia, hypertension and male sex.  Hypertension This is a chronic problem. The current episode started more than 1 year ago. The problem has been gradually improving since  onset. The problem is controlled. Pertinent negatives include no blurred vision or chest pain. There are no associated agents to hypertension. Risk factors for coronary artery disease include diabetes mellitus, dyslipidemia and male gender. Past treatments include angiotensin blockers. The current treatment provides moderate improvement. There are no compliance problems.  Hypertensive end-organ damage includes kidney disease, heart failure and retinopathy. Identifiable causes of hypertension include chronic renal disease.    Review of systems  Constitutional: + Minimally fluctuating body weight,  current Body mass index is 32.9 kg/m. , no fatigue, no subjective hyperthermia, no subjective hypothermia Eyes: no blurry vision, no xerophthalmia ENT: no sore throat, no nodules palpated in throat, no dysphagia/odynophagia, no hoarseness Cardiovascular: no chest pain, no shortness of breath, no palpitations, no leg swelling Respiratory: no cough, no shortness of breath Gastrointestinal: no nausea/vomiting/diarrhea Musculoskeletal: no muscle/joint aches Skin: no rashes, no hyperemia Neurological: no tremors, no numbness, no tingling, no dizziness Psychiatric: no depression, no anxiety  Objective:     BP 134/78 (BP Location: Left Arm, Patient Position: Sitting, Cuff Size: Large)   Pulse 60   Ht 5' 11.5" (1.816 m)   Wt 239 lb 3.2 oz (108.5 kg)   BMI 32.90 kg/m   Wt Readings from Last 3 Encounters:  05/11/23 239 lb 3.2 oz (108.5 kg)  02/02/23 236 lb (107 kg)  12/01/22 262 lb (118.8 kg)     BP Readings from Last 3 Encounters:  05/11/23 134/78  02/02/23 139/78  12/01/22 115/72      Physical Exam- Limited  Constitutional:  Body mass index is 32.9 kg/m. , not in acute distress, normal state of  mind Eyes:  EOMI, no exophthalmos Musculoskeletal: no gross deformities, strength intact in all four extremities, no gross restriction of joint movements Skin:  no rashes, no hyperemia Neurological: no tremor with outstretched hands   Diabetic Foot Exam - Simple   No data filed     CMP ( most recent) CMP     Component Value Date/Time   NA 141 03/06/2022 1628   NA 141 04/15/2021 0826   K 4.6 03/06/2022 1628   CL 107 03/06/2022 1628   CO2 25 03/06/2022 1628   GLUCOSE 140 (H) 03/06/2022 1628   BUN 11 03/06/2022 1628   BUN 19 04/15/2021 0826   CREATININE 1.20 04/20/2022 0809   CREATININE 1.03 03/06/2022 1628   CALCIUM 9.5 03/06/2022 1628   PROT 7.9 03/06/2022 1628   PROT 7.2 04/15/2021 0826   ALBUMIN 4.2 04/15/2021 0826   AST 21 03/06/2022 1628   ALT 14 03/06/2022 1628   ALKPHOS 116 04/15/2021 0826   BILITOT 0.4 03/06/2022 1628   BILITOT 0.5 04/15/2021 0826   GFRNONAA >60 08/27/2021 1008   GFRNONAA 76 05/07/2020 0816   GFRAA 89 05/07/2020 0816     Diabetic Labs (most recent): Lab Results  Component Value Date   HGBA1C 7.4 (A) 05/11/2023   HGBA1C 9.0 (A) 02/02/2023   HGBA1C 7.3 (A) 10/06/2022   MICROALBUR 150 10/20/2021   MICROALBUR 7.9 05/07/2020   MICROALBUR 17.5 (H) 10/15/2017     Lipid Panel ( most recent) Lipid Panel     Component Value Date/Time   CHOL 154 04/15/2021 0826   TRIG 89 04/15/2021 0826   HDL 48 04/15/2021 0826   CHOLHDL 3.2 04/15/2021 0826   CHOLHDL 2.7 05/07/2020 0816   VLDL 33.0 10/15/2017 0818   LDLCALC 89 04/15/2021 0826   LDLCALC 65 05/07/2020 0816   LDLDIRECT 120.2 04/04/2013 0918   LABVLDL 17 04/15/2021  1610      Lab Results  Component Value Date   TSH 1.490 04/15/2021   TSH 1.10 10/15/2017   TSH 1.63 04/28/2016   TSH 0.99 05/21/2014   TSH 1.43 04/04/2013   TSH 1.28 11/13/2011   TSH 0.77 03/29/2010   TSH 0.95 09/01/2009   TSH 0.71 02/08/2009   TSH 0.75 02/07/2008   FREET4 1.32 04/15/2021           Assessment &  Plan:   1) Controlled Type 2 Diabetes without complication  He presents today with his logs showing at target glycemic profile overall.  His POCT A1c today is 7.4%, improving from last visit of 9%.  He does have occasional drops in glucose likely due to meal timing being off.  Analysis of his meter shows 7-day average of 126, 14-day average of 118, 30-day average of 130, 90-day average of 136.  He also notes he had the Flu since last visit as well and noticed sugar readings were higher during that time.  - Pelham K Cianci has currently uncontrolled symptomatic type 2 DM since 68 years of age.  -Recent labs reviewed.  - I had a long discussion with him about the progressive nature of diabetes and the pathology behind its complications. -his diabetes is complicated by retinopathy, CKD, neuropathy, recent TMA and he remains at a high risk for more acute and chronic complications which include CAD, CVA, CKD, retinopathy, and neuropathy. These are all discussed in detail with him.  - Nutritional counseling repeated at each appointment due to patients tendency to fall back in to old habits.  - The patient admits there is a room for improvement in their diet and drink choices. -  Suggestion is made for the patient to avoid simple carbohydrates from their diet including Cakes, Sweet Desserts / Pastries, Ice Cream, Soda (diet and regular), Sweet Tea, Candies, Chips, Cookies, Sweet Pastries, Store Bought Juices, Alcohol in Excess of 1-2 drinks a day, Artificial Sweeteners, Coffee Creamer, and "Sugar-free" Products. This will help patient to have stable blood glucose profile and potentially avoid unintended weight gain.   - I encouraged the patient to switch to unprocessed or minimally processed complex starch and increased protein intake (animal or plant source), fruits, and vegetables.   - Patient is advised to stick to a routine mealtimes to eat 3 meals a day and avoid unnecessary snacks (to snack  only to correct hypoglycemia).  - I have approached him with the following individualized plan to manage  his diabetes and patient agrees:   -He is advised to continue Tradjenta 5 mg po daily and premixed 70/30 insulin (he buys this OTC at [facility]) at 10 units twice daily if glucose is above 90 and he is eating.   -he is encouraged to continue monitoring blood glucose 2 times daily, before breakfast and before bed, and to call the clinic if he has readings less than 70 or greater than 300 for 3 tests in a row.  I approached him about trying a CGM to help monitor glucose since we are re-initiating treatment with insulin.  I sent for Dexcom G7 sensor to [facility] but it was too expensive for him to afford.  - he is warned not to take insulin without proper monitoring per orders. - Adjustment parameters are given to him for hypo and hyperglycemia in writing.  -He did not tolerate GLP1- had significant GI symptoms.  He did not tolerate SGLT2i due to yeast infection in abdominal folds.  -  Specific targets for  A1c;  LDL, HDL,  and Triglycerides were discussed with the patient.  2) Blood Pressure /Hypertension:  his blood pressure is controlled to target.   he is advised to continue his current medications including Cozaar 50 mg p.o. daily with breakfast.    3) Lipids/Hyperlipidemia:    Review of his recent lipid panel from 04/15/21 showed controlled  LDL at 89 .  he  is advised to continue Lipitor 10 mg daily at bedtime.  Side effects and precautions discussed with him.  Will recheck lipid panel prior to next visit.  4)  Weight/Diet:  his Body mass index is 32.9 kg/m.  -   clearly complicating his diabetes care.   he is a candidate for weight loss. I discussed with him the fact that loss of 5 - 10% of his  current body weight will have the most impact on his diabetes management.  Exercise, and detailed carbohydrates information provided  -  detailed on discharge instructions.  5) Chronic  Care/Health Maintenance: -he is on ACEI/ARB and Statin medications and is encouraged to initiate and continue to follow up with Ophthalmology, Dentist, Podiatrist at least yearly or according to recommendations, and advised to stay away from smoking. I have recommended yearly flu vaccine and pneumonia vaccine at least every 5 years; moderate intensity exercise for up to 150 minutes weekly; and sleep for at least 7 hours a day.  - he is advised to maintain close follow up with Donita Brooks, MD for primary care needs, as well as his other providers for optimal and coordinated care.     I spent  22  minutes in the care of the patient today including review of labs from CMP, Lipids, Thyroid Function, Hematology (current and previous including abstractions from other facilities); face-to-face time discussing  his blood glucose readings/logs, discussing hypoglycemia and hyperglycemia episodes and symptoms, medications doses, his options of short and long term treatment based on the latest standards of care / guidelines;  discussion about incorporating lifestyle medicine;  and documenting the encounter. Risk reduction counseling performed per USPSTF guidelines to reduce obesity and cardiovascular risk factors.     Please refer to Patient Instructions for Blood Glucose Monitoring and Insulin/Medications Dosing Guide"  in media tab for additional information. Please  also refer to " Patient Self Inventory" in the Media  tab for reviewed elements of pertinent patient history.  Jeydan K Kowalczyk participated in the discussions, expressed understanding, and voiced agreement with the above plans.  All questions were answered to his satisfaction. he is encouraged to contact clinic should he have any questions or concerns prior to his return visit.   Follow up plan: - Return in about 4 months (around 09/10/2023) for Diabetes F/U with A1c in office, Previsit labs, Bring meter and logs.   Ronny Bacon,  [facility] [address] [place], [place] [address] Phone: [phone] Fax: [phone]  05/11/2023, 8:11 AM

## 2023-06-06 ENCOUNTER — Other Ambulatory Visit: Payer: Self-pay | Admitting: Internal Medicine

## 2023-06-06 DIAGNOSIS — E11319 Type 2 diabetes mellitus with unspecified diabetic retinopathy without macular edema: Secondary | ICD-10-CM

## 2023-06-21 ENCOUNTER — Ambulatory Visit: Payer: Self-pay

## 2023-06-21 NOTE — Telephone Encounter (Signed)
 Copied from CRM [id number]. Topic: Clinical - Red Word Triage >> Jun 21, 2023  8:27 AM Oddis Bench wrote: Red Word that prompted transfer to Nurse Triage: patient has a bite not painful sore to touch , no puss.  Chief Complaint: Insect bite Symptoms: Redness, mild pain and itching Frequency: A week Pertinent Negatives: Patient denies fever Disposition: [] ED /[] Urgent Care (no appt availability in office) / [x] Appointment(In office/virtual)/ []  [facility]/ [] Home Care/ [] Refused Recommended Disposition /[] [facility]/ []  Follow-up with PCP Additional Notes: Patient called in to report an insect bite on his left wrist. Patient stated the bite occurred a week ago and is increasing in size. Patient stated redness is present. Patient stated the area is mildly painful and itchy. Patient expressed concerned and desire to be seen by a provider this week. Advised patient to be seen within 24 hours, per protocol. No availability with PCP/PCP office. Please contact patient to schedule.   Reason for Disposition  [1] Red or very tender (to touch) area AND [2] getting larger over 48 hours after the bite  Answer Assessment - Initial Assessment Questions 1. TYPE of INSECT: "What type of insect was it?"      Unknown 2. ONSET: "When did you get bitten?"      End of last week, states bite has not improved 3. LOCATION: "Where is the insect bite located?"      Left wrist 4. REDNESS: "Is the area red or pink?" If Yes, ask: "What size is area of redness?" (inches or cm). "When did the redness start?"     Redness about the size of "50 cent piece" 5. PAIN: "Is there any pain?" If Yes, ask: "How bad is it?"  (Scale 1-10; or mild, moderate, severe)     Mild pain to the touch 6. ITCHING: "Does it itch?" If Yes, ask: "How bad is the itch?"    - MILD: doesn't interfere with normal activities   - MODERATE-SEVERE: interferes with work, school, sleep, or other activities      Mild itching 7.  SWELLING: "How big is the swelling?" (inches, cm, or compare to coins)     Slight swelling 8. OTHER SYMPTOMS: "Do you have any other symptoms?"  (e.g., difficulty breathing, hives)     Denies difficulty breathing, denies rash, denies discharge  Protocols used: Insect Bite-A-AH

## 2023-06-22 ENCOUNTER — Encounter: Payer: Self-pay | Admitting: Family Medicine

## 2023-06-22 ENCOUNTER — Ambulatory Visit (INDEPENDENT_AMBULATORY_CARE_PROVIDER_SITE_OTHER): Admitting: Family Medicine

## 2023-06-22 VITALS — BP 120/60 | HR 57 | Temp 98.6°F | Ht 71.5 in | Wt 235.2 lb

## 2023-06-22 DIAGNOSIS — L03114 Cellulitis of left upper limb: Secondary | ICD-10-CM

## 2023-06-22 DIAGNOSIS — E1165 Type 2 diabetes mellitus with hyperglycemia: Secondary | ICD-10-CM | POA: Diagnosis not present

## 2023-06-22 DIAGNOSIS — Z794 Long term (current) use of insulin: Secondary | ICD-10-CM

## 2023-06-22 MED ORDER — DOXYCYCLINE HYCLATE 100 MG PO TABS: 100.0000 mg | ORAL_TABLET | Freq: Two times a day (BID) | ORAL | 0 refills | Status: DC

## 2023-06-22 NOTE — Progress Notes (Signed)
 Patient Office Visit  Assessment & Plan:  Cellulitis of left upper extremity -     Doxycycline  Hyclate; Take 1 tablet (100 mg total) by mouth 2 (two) times daily.  Dispense: 10 tablet; Refill: 0  Type 2 diabetes mellitus with hyperglycemia, with long-term current use of insulin  [facility])   Patient will let us  know if not improving or worsening.  If he has worsening symptoms over the weekend he should go to urgent care/ER for further evaluation.  Patient will keep us  updated. No follow-ups on file.   Subjective:    Patient ID: Aaron Fox, male    DOB: 1956-02-17  Age: 68 y.o. MRN: [id number]  Chief Complaint  Patient presents with   Insect Bite    X 1 week. Left forearm.     HPI Left wrist Skin infection- since last Friday noticed a red area on the flexor surface of his wrist.  Patient was bit by something but does not know what it was.  Patient has noticed the redness and induration getting worse in the last week.  It is a little sore to the touch.  Use over-the-counter cream but not helping.  Patient was not sure if it was an antibiotic cream or not.  Patient does have type 2 diabetes and is on insulin .  Last A1c was in the 7 range.  It is not itchy, patient has had no fever or chills. Was doing outdoor work, has a Sales executive. The area has increased in size - 2x bigger compared to last week. Had an opening in the middle of it at one point and had a little bit of pus.  Patient has not noticed any purulence from it recently.   It is slightly warm to the touch.  No streaking noted. Pt has updated Tetanus shot.  Patient is allergic to amoxicillin and Cipro .  The 10-year ASCVD risk score (Arnett DK, et al., 2019) is: 25.8%  Past Medical History:  Diagnosis Date   ALLERGIC RHINITIS 07/02/2008   Allergy    Arthritis    RA   Atrial fibrillation (HCC)    Cataract    removed both eyes    CHF 06/19/2008   COUGH DUE TO ACE INHIBITORS 10/05/2008   DIABETES MELLITUS, TYPE II 09/22/2006    Diabetic retinopathy of both eyes (HCC)    mild nonproliferative   ED (erectile dysfunction)    Glaucoma    GOUT 09/22/2006   Hx of adenomatous colonic polyps 06/09/2015   HYPERCHOLESTEROLEMIA 07/02/2008   HYPERTENSION 05/31/2009   HYPOGONADISM, MALE 01/15/2007   Hypokalemia    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 Lenna Quinton   CATARACT EXTRACTION, BILATERAL     COLONOSCOPY  2004   LYMPH NODE BIOPSY Right 10/16/2014   Procedure: RIGHT INGUINAL LYMPH NODE BIOPSY;  Surgeon: Alanda Allegra, MD;  Location: [facility];  Service: General;  Laterality: Right;   Social History   Tobacco Use   Smoking status: Former  Current packs/day: 0.00    Types: Cigarettes    Quit date: 11/02/1999    Years since quitting: 23.6   Smokeless tobacco: Never  Vaping Use   Vaping status: Never Used  Substance Use Topics   Alcohol use: No    Alcohol/week: 0.0 standard drinks of alcohol   Drug use: No   Family History  Problem Relation Age of Onset   Heart disease Other        FH of CAD   Colon polyps Paternal Uncle    Kidney disease Paternal Uncle    Diabetes Paternal Grandfather    Diabetes Maternal Uncle    Cancer Neg Hx    Colon cancer Neg Hx    Gallbladder disease Neg Hx    Esophageal cancer Neg Hx    Rectal cancer Neg Hx    Stomach cancer Neg Hx    Allergies  Allergen Reactions   Penicillins Swelling    Swelling of the hands and feet, skin peeling Did it  involve swelling of the face/tongue/throat, SOB, or low BP? No Did it involve sudden or severe rash/hives, skin peeling, or any reaction on the inside of your mouth or nose? No Did you need to seek medical attention at a hospital or doctor's office? Yes When did it last happen?      10 + years If all above answers are "NO", may proceed with cephalosporin use.    Ciprofloxacin  Hives and Rash   Terbinafine Hcl Hives   Zestril [Lisinopril] Cough    ROS    Objective:    BP 120/60   Pulse (!) 57   Temp 98.6 F (37 C)   Ht 5' 11.5" (1.816 m)   Wt 235 lb 4 oz (106.7 kg)   SpO2 99%   BMI 32.35 kg/m  BP Readings from Last 3 Encounters:  06/22/23 120/60  05/11/23 134/78  02/02/23 139/78   Wt Readings from Last 3 Encounters:  06/22/23 235 lb 4 oz (106.7 kg)  05/11/23 239 lb 3.2 oz (108.5 kg)  02/02/23 236 lb (107 kg)    Physical Exam Vitals and nursing note reviewed.  Constitutional:      General: He is not in acute distress. Skin:    Findings: Erythema, lesion and rash present.     Comments: Left wrist- 7 cm x 5 cm erythematous indurated area in the left flexor surface of the left wrist.  A little tender to the touch in the middle.  No opening noted no purulence.  It is warm to the touch.  No streaking noted.  Patient is able to use his left hand and make a fist without difficulty.  Neurovascularly intact.  Neurological:     Mental Status: He is alert.  Psychiatric:        Mood and Affect: Mood normal.        Behavior: Behavior normal.      No results found for any visits on 06/22/23.

## 2023-07-24 ENCOUNTER — Other Ambulatory Visit: Payer: Self-pay | Admitting: Podiatry

## 2023-07-24 MED ORDER — DOXYCYCLINE HYCLATE 100 MG PO TABS: 100.0000 mg | ORAL_TABLET | Freq: Two times a day (BID) | ORAL | 0 refills | Status: DC

## 2023-07-24 NOTE — Progress Notes (Signed)
 Patient called stating he has a spot on the toe. He states he has swelling to the foot but no fevers or chills. I have sent doxycyline to the pharmacy and he has a scheduled follow up this week. Discussed if anything changes to go the ER or let us  know. Verbilized understanding. No further questions or concerns.

## 2023-07-25 ENCOUNTER — Ambulatory Visit

## 2023-07-27 ENCOUNTER — Inpatient Hospital Stay (HOSPITAL_COMMUNITY)

## 2023-07-27 ENCOUNTER — Inpatient Hospital Stay (HOSPITAL_COMMUNITY)
Admission: EM | Admit: 2023-07-27 | Discharge: 2023-07-31 | DRG: 617 | Disposition: A | Discharge status: Home / Self Care | Source: Ambulatory Visit | Attending: Obstetrics and Gynecology | Admitting: Obstetrics and Gynecology

## 2023-07-27 ENCOUNTER — Other Ambulatory Visit: Payer: Self-pay | Admitting: Family Medicine

## 2023-07-27 ENCOUNTER — Encounter: Payer: Self-pay | Admitting: Podiatry

## 2023-07-27 ENCOUNTER — Other Ambulatory Visit: Payer: Self-pay

## 2023-07-27 ENCOUNTER — Emergency Department (HOSPITAL_COMMUNITY)

## 2023-07-27 ENCOUNTER — Ambulatory Visit (INDEPENDENT_AMBULATORY_CARE_PROVIDER_SITE_OTHER)

## 2023-07-27 ENCOUNTER — Ambulatory Visit (INDEPENDENT_AMBULATORY_CARE_PROVIDER_SITE_OTHER): Payer: Medicare HMO | Admitting: Podiatry

## 2023-07-27 DIAGNOSIS — I4891 Unspecified atrial fibrillation: Secondary | ICD-10-CM | POA: Diagnosis present

## 2023-07-27 DIAGNOSIS — E1169 Type 2 diabetes mellitus with other specified complication: Secondary | ICD-10-CM | POA: Diagnosis not present

## 2023-07-27 DIAGNOSIS — I509 Heart failure, unspecified: Secondary | ICD-10-CM | POA: Diagnosis present

## 2023-07-27 DIAGNOSIS — E10621 Type 1 diabetes mellitus with foot ulcer: Secondary | ICD-10-CM | POA: Diagnosis not present

## 2023-07-27 DIAGNOSIS — Z87891 Personal history of nicotine dependence: Secondary | ICD-10-CM | POA: Diagnosis not present

## 2023-07-27 DIAGNOSIS — I110 Hypertensive heart disease with heart failure: Secondary | ICD-10-CM | POA: Diagnosis not present

## 2023-07-27 DIAGNOSIS — Z881 Allergy status to other antibiotic agents status: Secondary | ICD-10-CM | POA: Diagnosis not present

## 2023-07-27 DIAGNOSIS — Z8711 Personal history of peptic ulcer disease: Secondary | ICD-10-CM

## 2023-07-27 DIAGNOSIS — L97529 Non-pressure chronic ulcer of other part of left foot with unspecified severity: Secondary | ICD-10-CM | POA: Diagnosis not present

## 2023-07-27 DIAGNOSIS — Z83719 Family history of colon polyps, unspecified: Secondary | ICD-10-CM | POA: Diagnosis not present

## 2023-07-27 DIAGNOSIS — M069 Rheumatoid arthritis, unspecified: Secondary | ICD-10-CM | POA: Diagnosis present

## 2023-07-27 DIAGNOSIS — M19072 Primary osteoarthritis, left ankle and foot: Secondary | ICD-10-CM | POA: Diagnosis not present

## 2023-07-27 DIAGNOSIS — R609 Edema, unspecified: Secondary | ICD-10-CM | POA: Diagnosis not present

## 2023-07-27 DIAGNOSIS — M799 Soft tissue disorder, unspecified: Secondary | ICD-10-CM | POA: Diagnosis not present

## 2023-07-27 DIAGNOSIS — Z794 Long term (current) use of insulin: Secondary | ICD-10-CM

## 2023-07-27 DIAGNOSIS — E10319 Type 1 diabetes mellitus with unspecified diabetic retinopathy without macular edema: Secondary | ICD-10-CM | POA: Diagnosis present

## 2023-07-27 DIAGNOSIS — Z791 Long term (current) use of non-steroidal anti-inflammatories (NSAID): Secondary | ICD-10-CM | POA: Diagnosis not present

## 2023-07-27 DIAGNOSIS — M86172 Other acute osteomyelitis, left ankle and foot: Principal | ICD-10-CM | POA: Diagnosis present

## 2023-07-27 DIAGNOSIS — Z888 Allergy status to other drugs, medicaments and biological substances status: Secondary | ICD-10-CM

## 2023-07-27 DIAGNOSIS — E1042 Type 1 diabetes mellitus with diabetic polyneuropathy: Secondary | ICD-10-CM | POA: Diagnosis present

## 2023-07-27 DIAGNOSIS — Z7982 Long term (current) use of aspirin: Secondary | ICD-10-CM

## 2023-07-27 DIAGNOSIS — Z7984 Long term (current) use of oral hypoglycemic drugs: Secondary | ICD-10-CM | POA: Diagnosis not present

## 2023-07-27 DIAGNOSIS — Z833 Family history of diabetes mellitus: Secondary | ICD-10-CM

## 2023-07-27 DIAGNOSIS — E1065 Type 1 diabetes mellitus with hyperglycemia: Secondary | ICD-10-CM | POA: Diagnosis present

## 2023-07-27 DIAGNOSIS — Z9889 Other specified postprocedural states: Secondary | ICD-10-CM | POA: Diagnosis not present

## 2023-07-27 DIAGNOSIS — Z880 Allergy status to penicillin: Secondary | ICD-10-CM | POA: Diagnosis not present

## 2023-07-27 DIAGNOSIS — Z860101 Personal history of adenomatous and serrated colon polyps: Secondary | ICD-10-CM | POA: Diagnosis not present

## 2023-07-27 DIAGNOSIS — M869 Osteomyelitis, unspecified: Secondary | ICD-10-CM | POA: Diagnosis not present

## 2023-07-27 DIAGNOSIS — E1069 Type 1 diabetes mellitus with other specified complication: Secondary | ICD-10-CM | POA: Diagnosis not present

## 2023-07-27 DIAGNOSIS — Z8249 Family history of ischemic heart disease and other diseases of the circulatory system: Secondary | ICD-10-CM | POA: Diagnosis not present

## 2023-07-27 DIAGNOSIS — L97522 Non-pressure chronic ulcer of other part of left foot with fat layer exposed: Secondary | ICD-10-CM | POA: Diagnosis not present

## 2023-07-27 DIAGNOSIS — M2042 Other hammer toe(s) (acquired), left foot: Secondary | ICD-10-CM | POA: Diagnosis not present

## 2023-07-27 DIAGNOSIS — I96 Gangrene, not elsewhere classified: Secondary | ICD-10-CM | POA: Diagnosis not present

## 2023-07-27 LAB — HEMOGLOBIN A1C
Hgb A1c MFr Bld: 6.9 % — ABNORMAL HIGH (ref 4.8–5.6)
Mean Plasma Glucose: 151.33 mg/dL

## 2023-07-27 LAB — CBC WITH DIFFERENTIAL/PLATELET
Abs Immature Granulocytes: 0 10*3/uL (ref 0.00–0.07)
Basophils Absolute: 0.1 10*3/uL (ref 0.0–0.1)
Basophils Relative: 2 %
Eosinophils Absolute: 0.3 10*3/uL (ref 0.0–0.5)
Eosinophils Relative: 6 %
HCT: 32.6 % — ABNORMAL LOW (ref 39.0–52.0)
Hemoglobin: 10.9 g/dL — ABNORMAL LOW (ref 13.0–17.0)
Immature Granulocytes: 0 %
Lymphocytes Relative: 30 %
Lymphs Abs: 1.5 10*3/uL (ref 0.7–4.0)
MCH: 28.1 pg (ref 26.0–34.0)
MCHC: 33.4 g/dL (ref 30.0–36.0)
MCV: 84 fL (ref 80.0–100.0)
Monocytes Absolute: 0.6 10*3/uL (ref 0.1–1.0)
Monocytes Relative: 12 %
Neutro Abs: 2.5 10*3/uL (ref 1.7–7.7)
Neutrophils Relative %: 50 %
Platelets: 242 10*3/uL (ref 150–400)
RBC: 3.88 MIL/uL — ABNORMAL LOW (ref 4.22–5.81)
RDW: 13.2 % (ref 11.5–15.5)
WBC: 4.9 10*3/uL (ref 4.0–10.5)
nRBC: 0 % (ref 0.0–0.2)

## 2023-07-27 LAB — GLUCOSE, CAPILLARY
Glucose-Capillary: 89 mg/dL (ref 70–99)
Glucose-Capillary: 234 mg/dL — ABNORMAL HIGH (ref 70–99)
Glucose-Capillary: 108 mg/dL — ABNORMAL HIGH (ref 70–99)

## 2023-07-27 LAB — BASIC METABOLIC PANEL WITH GFR
Anion gap: 9 (ref 5–15)
BUN: 27 mg/dL — ABNORMAL HIGH (ref 8–23)
CO2: 20 mmol/L — ABNORMAL LOW (ref 22–32)
Calcium: 9.2 mg/dL (ref 8.9–10.3)
Chloride: 106 mmol/L (ref 98–111)
Creatinine, Ser: 1.38 mg/dL — ABNORMAL HIGH (ref 0.61–1.24)
GFR, Estimated: 56 mL/min — ABNORMAL LOW (ref >60)
Glucose, Bld: 214 mg/dL — ABNORMAL HIGH (ref 70–99)
Potassium: 4.2 mmol/L (ref 3.5–5.1)
Sodium: 135 mmol/L (ref 135–145)

## 2023-07-27 LAB — SEDIMENTATION RATE: Sed Rate: 110 mm/h — ABNORMAL HIGH (ref 0–16)

## 2023-07-27 LAB — HIV ANTIBODY (ROUTINE TESTING W REFLEX): HIV Screen 4th Generation wRfx: NONREACTIVE

## 2023-07-27 LAB — C-REACTIVE PROTEIN: CRP: 4.2 mg/dL — ABNORMAL HIGH (ref <1.0)

## 2023-07-27 LAB — PREALBUMIN: Prealbumin: 15 mg/dL — ABNORMAL LOW (ref 18–38)

## 2023-07-27 LAB — I-STAT CG4 LACTIC ACID, ED: Lactic Acid, Venous: 1 mmol/L (ref 0.5–1.9)

## 2023-07-27 MED ORDER — VITAMIN C 500 MG PO TABS
500.0000 mg | ORAL_TABLET | Freq: Two times a day (BID) | ORAL | Status: DC
  Administered 2023-07-27 – 2023-07-31 (×8): 500 mg via ORAL
  Filled 2023-07-27 (×8): qty 1

## 2023-07-27 MED ORDER — GABAPENTIN 300 MG PO CAPS
600.0000 mg | ORAL_CAPSULE | Freq: Three times a day (TID) | ORAL | Status: DC
  Administered 2023-07-27 – 2023-07-31 (×12): 600 mg via ORAL
  Filled 2023-07-27 (×12): qty 2

## 2023-07-27 MED ORDER — INSULIN ASPART PROT & ASPART (70-30 MIX) 100 UNIT/ML ~~LOC~~ SUSP
10.0000 [IU] | Freq: Two times a day (BID) | SUBCUTANEOUS | Status: DC
  Administered 2023-07-27 – 2023-07-31 (×8): 10 [IU] via SUBCUTANEOUS
  Filled 2023-07-27: qty 10

## 2023-07-27 MED ORDER — VANCOMYCIN HCL 2000 MG/400ML IV SOLN
2000.0000 mg | Freq: Once | INTRAVENOUS | Status: AC
  Administered 2023-07-27: 2000 mg via INTRAVENOUS
  Filled 2023-07-27: qty 400

## 2023-07-27 MED ORDER — SODIUM CHLORIDE 0.9 % IV SOLN
2.0000 g | Freq: Once | INTRAVENOUS | Status: AC
  Administered 2023-07-27: 2 g via INTRAVENOUS
  Filled 2023-07-27: qty 20

## 2023-07-27 MED ORDER — ENOXAPARIN SODIUM 40 MG/0.4ML IJ SOSY
40.0000 mg | PREFILLED_SYRINGE | Freq: Every day | INTRAMUSCULAR | Status: DC
  Administered 2023-07-27 – 2023-07-31 (×4): 40 mg via SUBCUTANEOUS
  Filled 2023-07-27 (×4): qty 0.4

## 2023-07-27 MED ORDER — NAPHAZOLINE-PHENIRAMINE 0.025-0.3 % OP SOLN: 1.0000 [drp] | Freq: Every day | OPHTHALMIC | Status: DC | PRN

## 2023-07-27 MED ORDER — ENSURE MAX PROTEIN PO LIQD
11.0000 [oz_av] | Freq: Every day | ORAL | Status: DC
  Administered 2023-07-27 – 2023-07-30 (×3): 11 [oz_av] via ORAL
  Filled 2023-07-27 (×5): qty 330

## 2023-07-27 MED ORDER — VANCOMYCIN HCL 1.25 G IV SOLR
1250.0000 mg | INTRAVENOUS | Status: DC
  Filled 2023-07-27: qty 25

## 2023-07-27 MED ORDER — METRONIDAZOLE 500 MG/100ML IV SOLN
500.0000 mg | Freq: Two times a day (BID) | INTRAVENOUS | Status: DC
  Administered 2023-07-27 – 2023-07-30 (×6): 500 mg via INTRAVENOUS
  Filled 2023-07-27 (×6): qty 100

## 2023-07-27 MED ORDER — INSULIN ASPART 100 UNIT/ML IJ SOLN
0.0000 [IU] | Freq: Three times a day (TID) | INTRAMUSCULAR | Status: DC
  Administered 2023-07-28: 3 [IU] via SUBCUTANEOUS
  Administered 2023-07-28: 4 [IU] via SUBCUTANEOUS
  Administered 2023-07-29: 3 [IU] via SUBCUTANEOUS
  Administered 2023-07-29: 7 [IU] via SUBCUTANEOUS
  Administered 2023-07-30: 3 [IU] via SUBCUTANEOUS
  Administered 2023-07-30: 15 [IU] via SUBCUTANEOUS
  Administered 2023-07-31: 4 [IU] via SUBCUTANEOUS
  Administered 2023-07-31: 7 [IU] via SUBCUTANEOUS

## 2023-07-27 MED ORDER — SODIUM CHLORIDE 0.9 % IV SOLN
2.0000 g | INTRAVENOUS | Status: DC
  Administered 2023-07-28 – 2023-07-30 (×3): 2 g via INTRAVENOUS
  Filled 2023-07-27 (×3): qty 20

## 2023-07-27 MED ORDER — ATORVASTATIN CALCIUM 10 MG PO TABS
10.0000 mg | ORAL_TABLET | Freq: Every day | ORAL | Status: DC
  Administered 2023-07-28 – 2023-07-31 (×4): 10 mg via ORAL
  Filled 2023-07-27 (×5): qty 1

## 2023-07-27 MED ORDER — ADULT MULTIVITAMIN W/MINERALS CH
1.0000 | ORAL_TABLET | Freq: Every day | ORAL | Status: DC
  Administered 2023-07-28 – 2023-07-31 (×4): 1 via ORAL
  Filled 2023-07-27 (×5): qty 1

## 2023-07-27 MED ORDER — ASPIRIN 81 MG PO CHEW
81.0000 mg | CHEWABLE_TABLET | Freq: Every day | ORAL | Status: DC
  Administered 2023-07-28 – 2023-07-31 (×3): 81 mg via ORAL
  Filled 2023-07-27 (×4): qty 1

## 2023-07-27 MED ORDER — JUVEN PO PACK
1.0000 | PACK | Freq: Two times a day (BID) | ORAL | Status: DC
  Administered 2023-07-27 – 2023-07-31 (×8): 1 via ORAL
  Filled 2023-07-27 (×8): qty 1

## 2023-07-27 MED ORDER — LOSARTAN POTASSIUM 50 MG PO TABS
50.0000 mg | ORAL_TABLET | Freq: Every day | ORAL | Status: DC
  Administered 2023-07-28 – 2023-07-31 (×4): 50 mg via ORAL
  Filled 2023-07-27 (×5): qty 1

## 2023-07-27 MED ORDER — ZINC SULFATE 220 (50 ZN) MG PO CAPS
220.0000 mg | ORAL_CAPSULE | Freq: Every day | ORAL | Status: DC
  Administered 2023-07-27 – 2023-07-31 (×5): 220 mg via ORAL
  Filled 2023-07-27 (×5): qty 1

## 2023-07-27 NOTE — ED Provider Notes (Signed)
 [place] EMERGENCY DEPARTMENT AT [facility] Provider Note   CSN: [id number] Arrival date & time: 07/27/23  4098     History  Chief Complaint  Patient presents with   Nail Problem    Aaron Fox is a 68 y.o. male.  Patient is a 68 year old male with a past medical history of diabetes, hypertension, CHF presenting to the emergency department with a wound to his left toe.  Patient states about 2 weeks ago he got new work boots and started to develop a wound and discomfort in his left toe.  He spoke with his podiatrist and was started on doxycycline  and was seen in the office today.  He states that he had x-rays that were concerning for osteomyelitis and he was recommended to come to the ER for IV antibiotics.  He states that he has had no fevers.  He states that he has had increased soreness and swelling to his left leg.  He denies any nausea or vomiting.  The history is provided by the patient and medical records.       Home Medications Prior to Admission medications   Medication Sig Start Date End Date Taking? Authorizing Provider  Accu-Chek Softclix Lancets lancets TEST BLOOD SUGAR 7 TIMES DAILY 08/30/22   Emilie Harden, MD  Alcohol Swabs (B-D SINGLE USE SWABS REGULAR) PADS Use to cleanse site prior to use of lancet to obtain droplet of blood two times per day; E10.49 06/03/18   Gwyndolyn Lerner, MD  aspirin  81 MG tablet Take 81 mg by mouth daily.    [provider]  atorvastatin  (LIPITOR) 10 MG tablet TAKE 1 TABLET EVERY DAY 08/30/22   Austine Lefort, MD  Blood Glucose Monitoring Suppl (ACCU-CHEK GUIDE ME) w/Device KIT 1 each by Does not apply route See admin instructions. Use to monitor glucose levels 7 times daily; E11.9; WILL NOT COMPLETE PA 12/14/20   Austine Lefort, MD  Continuous Glucose Sensor (DEXCOM G7 SENSOR) MISC Inject 1 Application into the skin as directed. Change sensor every 10 days as directed. 02/02/23   Wendel Hals, NP   doxycycline  (VIBRA -TABS) 100 MG tablet Take 1 tablet (100 mg total) by mouth 2 (two) times daily. 06/22/23   Amadeo June, MD  doxycycline  (VIBRA -TABS) 100 MG tablet Take 1 tablet (100 mg total) by mouth 2 (two) times daily. 07/24/23   Charity Conch, DPM  gabapentin  (NEURONTIN ) 300 MG capsule TAKE 2 CAPSULES THREE TIMES DAILY 01/08/23   Austine Lefort, MD  glucose blood (ACCU-CHEK GUIDE TEST) test strip Use as instructed to monitor glucose 4 times daily 02/02/23   Wendel Hals, NP  HYDROcodone  bit-homatropine (HYCODAN) 5-1.5 MG/5ML syrup Take 5 mLs by mouth every 8 (eight) hours as needed for cough. 01/30/22   Austine Lefort, MD  insulin  isophane & regular human KwikPen (NOVOLIN  70/30 KWIKPEN) (70-30) 100 UNIT/ML KwikPen Inject 10 Units into the skin 2 (two) times daily before a meal. 06/02/22   Reardon, Arminda Landmark, NP  INSULIN  SYRINGE 1CC/29G (DROPLET INSULIN  SYRINGE) 29G X 1/2" 1 ML MISC Use to inject in morning and bedtime 02/10/22   Shamleffer, Ibtehal Jaralla, MD  linagliptin  (TRADJENTA ) 5 MG TABS tablet Take 1 tablet (5 mg total) by mouth daily. 02/02/23   Wendel Hals, NP  losartan  (COZAAR ) 50 MG tablet TAKE 1 TABLET EVERY DAY 12/22/21   Austine Lefort, MD  meloxicam  (MOBIC ) 15 MG tablet Take 1 tablet (15 mg total) by mouth daily. 03/28/21  Austine Lefort, MD  mupirocin  ointment (BACTROBAN ) 2 % Apply 1 application topically 2 (two) times daily. 12/13/20   Charity Conch, DPM  Tetrahydrozoline HCl (VISINE OP) Place 1 drop into both eyes daily as needed (redness).    [provider]  triamcinolone  cream (KENALOG ) 0.1 % APPLY TO THE AFFECTED AREA(S) TOPICALLY THREE TIMES DAILY AS NEEDED FOR RASH 07/19/22   Jenelle Mis, FNP      Allergies    Penicillins, Ciprofloxacin , Terbinafine hcl, and Zestril [lisinopril]    Review of Systems   Review of Systems  Physical Exam Updated Vital Signs BP 128/66 (BP Location: Right Arm)   Pulse 74   Temp 98.3 F  (36.8 C) (Oral)   Resp 16   SpO2 97%  Physical Exam Vitals and nursing note reviewed.  Constitutional:      General: He is not in acute distress.    Appearance: Normal appearance.  HENT:     Head: Normocephalic and atraumatic.     Nose: Nose normal.     Mouth/Throat:     Mouth: Mucous membranes are moist.  Eyes:     Extraocular Movements: Extraocular movements intact.     Conjunctiva/sclera: Conjunctivae normal.  Cardiovascular:     Rate and Rhythm: Normal rate and regular rhythm.     Heart sounds: Normal heart sounds.  Pulmonary:     Effort: Pulmonary effort is normal.     Breath sounds: Normal breath sounds.  Abdominal:     General: Abdomen is flat.     Palpations: Abdomen is soft.     Tenderness: There is no abdominal tenderness.  Musculoskeletal:        General: Normal range of motion.     Cervical back: Normal range of motion.     Right lower leg: No edema.     Left lower leg: Edema (L foot, ankle, shin) present.  Skin:    General: Skin is warm and dry.     Comments: ~1.5 cm ulceration to muscle tissue on tip of L toe, no active drainage, mild erythema, significant swelling, no significant warmth to touch  Neurological:     General: No focal deficit present.     Mental Status: He is alert and oriented to person, place, and time.  Psychiatric:        Mood and Affect: Mood normal.        Behavior: Behavior normal.     ED Results / Procedures / Treatments   Labs (all labs ordered are listed, but only abnormal results are displayed) Labs Reviewed  BASIC METABOLIC PANEL WITH GFR - Abnormal; Notable for the following components:      Result Value   CO2 20 (*)    Glucose, Bld 214 (*)    BUN 27 (*)    Creatinine, Ser 1.38 (*)    GFR, Estimated 56 (*)    All other components within normal limits  CBC WITH DIFFERENTIAL/PLATELET - Abnormal; Notable for the following components:   RBC 3.88 (*)    Hemoglobin 10.9 (*)    HCT 32.6 (*)    All other components within  normal limits  CULTURE, BLOOD (ROUTINE X 2)  CULTURE, BLOOD (ROUTINE X 2)  I-STAT CG4 LACTIC ACID, ED    EKG None  Radiology DG Foot Complete Left Result Date: 07/27/2023 Please see detailed radiograph report in office note.   Procedures Procedures    Medications Ordered in ED Medications  cefTRIAXone (ROCEPHIN) 2 g in sodium  chloride 0.9 % 100 mL IVPB (has no administration in time range)  vancomycin  (VANCOREADY) IVPB 2000 mg/400 mL (has no administration in time range)    ED Course/ Medical Decision Making/ A&P Clinical Course as of 07/27/23 0949  Fri Jul 27, 2023  0931 I spoke with Dr. Rosemarie Conquest with podiatry who will follow, recommended MRI and ABI. [VK]  0946 Mild hyperglycemia and mildly increased Cr. Normal WBC count. Will call hospitalist for admission. [VK]    Clinical Course User Index [VK] Kingsley, Yaneisy Wenz K, DO                                 Medical Decision Making This patient presents to the ED with chief complaint(s) of diabetic foot wound with pertinent past medical history of DM, HTN, CHF which further complicates the presenting complaint. The complaint involves an extensive differential diagnosis and also carries with it a high risk of complications and morbidity.    The differential diagnosis includes cellulitis, osteomyelitis, however no signs of sepsis on exam, hyperglycemia  Additional history obtained: Additional history obtained from N/A Records reviewed outpatient podiatry records  ED Course and Reassessment: On patient's arrival he is hemodynamically stable in no acute distress.  Does have a wound to his left first toe down to muscle belly with significant swelling to his left foot and lower leg.  Patient was recommended by podiatry for IV antibiotics as he had signs of osteomyelitis on outpatient x-ray this morning.  Patient will have labs and antibiotics ordered.  Will notify podiatry that he is here and he will require  admission.  Independent labs interpretation:  The following labs were independently interpreted: mild hyperglycemia without DKA, mildly increased Cr  Independent visualization of imaging: - N/A  Consultation: - Consulted or discussed management/test interpretation w/ external professional: podiatry, hospitalist  Consideration for admission or further workup: patient requires admission for osteomyelitis Social Determinants of health: N/A     Amount and/or Complexity of Data Reviewed Labs: ordered.  Risk Prescription drug management. Decision regarding hospitalization.          Final Clinical Impression(s) / ED Diagnoses Final diagnoses:  Other acute osteomyelitis of left foot [facility])    Rx / DC Orders ED Discharge Orders     None         Kingsley, Cassey Bacigalupo K, DO 07/27/23 [phone]

## 2023-07-27 NOTE — Progress Notes (Signed)
 Initial Nutrition Assessment  DOCUMENTATION CODES:   Obesity unspecified  INTERVENTION:   -Continue with carb modified diet -MVI with minerals daily -500 mg vitamin C BID -220 mg zinc sulfate daily -1 packet Juven BID, each packet provides 95 calories, 2.5 grams of protein (collagen), and 9.8 grams of carbohydrate (3 grams sugar); also contains 7 grams of L-arginine and L-glutamine, 300 mg vitamin C, 15 mg vitamin E, 1.2 mcg vitamin B-12, 9.5 mg zinc, 200 mg calcium , and 1.5 g  Calcium  Beta-hydroxy-Beta-methylbutyrate to support wound healing  -Ensure Max po daily, each supplement provides 150 kcal and 30 grams of protein.    NUTRITION DIAGNOSIS:   Increased nutrient needs related to post-op healing as evidenced by estimated needs.  GOAL:   Patient will meet greater than or equal to 90% of their needs  MONITOR:   PO intake, Supplement acceptance  REASON FOR ASSESSMENT:   Consult Assessment of nutrition requirement/status, Hip fracture protocol  ASSESSMENT:   Pt with PMHx significant for  T1DM with neuroapthy with Left hallux ulceration with concern for underlying osteomyelitis.  Pt admitted with lt hallux ulcer with concern for osteomyelitis.   Reviewed I/O's: +100 ml x 24 hours  Pt unavailable at time of visit. Attempted to speak with pt via call to hospital room phone, however, unable to reach. RD unable to obtain further nutrition-related history or complete nutrition-focused physical exam at this time.    Reached out to RN via secure chat to discuss pt.   Per podiatry notes, likely plan for surgery on 07/30/23. Recommending MRI of lt toes to evaluate for osteomyelitis as well as ABI.   Pt has not been seen by RD previously.   Pt with increased nutritional needs for wound healing and possible surgery; would benefit from oral nutrition supplements and vitamin supplementation to promote healing.   No wt loss noted over the past year.   Medications reviewed and  include lovenox, neurontin , and MVI.   Lab Results  Component Value Date   HGBA1C 6.9 (H) 07/27/2023   PTA DM medications are 10 units novolin  70/30 BID.   Labs reviewed: CBGS: 173 (inpatient orders for glycemic control are 0-20 units insulin  aspart TID with meals and 10 units insulin  aspart protamine-aspart BID).    Diet Order:   Diet Order             Diet Carb Modified Fluid consistency: Thin; Room service appropriate? Yes  Diet effective now                   EDUCATION NEEDS:   No education needs have been identified at this time  Skin:  Skin Assessment: Skin Integrity Issues: Skin Integrity Issues:: Diabetic Ulcer Diabetic Ulcer: lt hallux  Last BM:  Unknown  Height:   Ht Readings from Last 1 Encounters:  06/22/23 5' 11.5" (1.816 m)    Weight:   Wt Readings from Last 1 Encounters:  07/27/23 106.7 kg    Ideal Body Weight:  79.8 kg  BMI:  Body mass index is 32.35 kg/m.  Estimated Nutritional Needs:   Kcal:  2200-2400  Protein:  120-135 grams  Fluid:  > 2 L    Herschel Lords, RD, LDN, CDCES Registered Dietitian III Certified Diabetes Care and Education Specialist If unable to reach this RD, please use "RD Inpatient" group chat on secure chat between hours of 8am-4 pm daily

## 2023-07-27 NOTE — Progress Notes (Signed)
 ED Pharmacy Antibiotic Sign Off An antibiotic consult was received from an ED provider for vancomycin  per pharmacy dosing for possible foot osteomyelitis. A chart review was completed to assess appropriateness.   The following one time order(s) were placed:  Vancomycin  IV 2,000 mg   Further antibiotic and/or antibiotic pharmacy consults should be ordered by the admitting provider if indicated.   Thank you for allowing pharmacy to be a part of this patient's care.   Adaline Ada, [facility]  Clinical Pharmacist 07/27/23 9:31 AM

## 2023-07-27 NOTE — ED Provider Triage Note (Signed)
 Emergency Medicine Provider Triage Evaluation Note  Aaron Fox , a 68 y.o. male  was evaluated in triage.  Pt complains of toe infection.  Review of Systems  Positive: wound Negative: fever  Physical Exam  BP 128/66 (BP Location: Right Arm)   Pulse 74   Temp 98.3 F (36.8 C) (Oral)   Resp 16   SpO2 97%  Ulcer of the left great toe.  Medical Decision Making  Medically screening exam initiated at 8:37 AM.  Appropriate orders placed.  Aaron Fox was informed that the remainder of the evaluation will be completed by another provider, this initial triage assessment does not replace that evaluation, and the importance of remaining in the ED until their evaluation is complete.  Patient with left great toe wound.  Seen by podiatry and sent in.  Osteomyelitis on x-ray.  Will require admission hospital.   Aaron Arias, MD 07/27/23 [phone]

## 2023-07-27 NOTE — Progress Notes (Signed)
 Pharmacy Antibiotic Note  Aaron Fox is a 68 y.o. male admitted on 07/27/2023 with possible foot osteomyelitis.  Pharmacy has been consulted for vancomycin  dosing.  WBC WNL and CrCl 65.1 mL/min.   Plan: Vancomycin  2,000 mg IV bolus, then vancomycin  1250 mg IV q24h (eAUC 465, Vd 0.5, Scr 1.38) Ceftriaxone 2 g IV q24h  Flagyl 500 mg q12h  Monitor renal function, signs of clinical improvement, and CBC Vancomycin  levels as indicated   Weight: 106.7 kg (235 lb 3.7 oz)  Temp (24hrs), Avg:98.3 F (36.8 C), Min:98.3 F (36.8 C), Max:98.3 F (36.8 C)  Recent Labs  Lab 07/27/23 0836 07/27/23 1008  WBC 4.9  --   CREATININE 1.38*  --   LATICACIDVEN  --  1.0    Estimated Creatinine Clearance: 65.1 mL/min (A) (by C-G formula based on SCr of 1.38 mg/dL (H)).    Allergies  Allergen Reactions   Penicillins Swelling and Other (See Comments)    Swelling of hands and feet Skin peeling   Lamisil [Terbinafine] Hives   Zestril [Lisinopril] Cough   Cipro  [Ciprofloxacin  Hcl] Hives and Rash    Antimicrobials this admission: CTX 5/30 >> Vanc 5/30 >> Flagyl 5/30 >>   Microbiology results: 5/30 Bcx:   Thank you for allowing pharmacy to be a part of this patient's care.  Adaline Ada, PharmD PGY1 Pharmacy Resident 07/27/2023 10:37 AM

## 2023-07-27 NOTE — ED Triage Notes (Signed)
 Pt. Stated, I got a new pair of shoes and I guess it was rubbing and it became infected. Left big toe.

## 2023-07-27 NOTE — TOC CM/SW Note (Signed)
 Transition of Care [facility]) - Inpatient Brief Assessment   Patient Details  Name: Aaron Fox MRN: [id number] Date of Birth: 03/14/1955  Transition of Care [facility]) CM/SW Contact:    Juliane Och, LCSW Phone Number: 07/27/2023, 4:06 PM   Clinical Narrative:  4:06 PM Per chart review, patient resides at home. Patient has a PCP and insurance. Patient does not have SNF or HH history. Patient has DME history (rolling walker, diabetic shoe). TOC consulted was placed by hospitalist for HH/DME needs and medication assistance. TOC will continue to follow.  Transition of Care Asessment: Insurance and Status: Insurance coverage has been reviewed Patient has primary care physician: Yes Home environment has been reviewed: Private Residence Prior level of function:: N/A Prior/Current Home Services: No current home services Social Drivers of Health Review: SDOH reviewed no interventions necessary Readmission risk has been reviewed: Yes Transition of care needs: transition of care needs identified, TOC will continue to follow

## 2023-07-27 NOTE — Progress Notes (Signed)
  Subjective:  Patient ID: Aaron Fox, male    DOB: 1956-01-16,  MRN: [id number]  Chief Complaint  Patient presents with   Foot Ulcer    RM#11 Left foot great toe ulcer with drainage patient states started when started wearing new shoes a week ago.Experiencing foot swelling and discomfort.    Discussed the use of AI scribe software for clinical note transcription with the patient, who gave verbal consent to proceed.  History of Present Illness Aaron Fox is a 68 year old male who presents with a sore and swollen toe, suspected to be infected.  Symptoms began a week ago after wearing new work shoes, starting with a small bruise on the toe, progressing to soreness and swelling. The toe has a slight odor and minimal drainage. No fevers or chills are present. He has been taking oral antibiotics for the past few days, but swelling persists. Current imaging shows differences in bone structure compared to x-rays from two years ago.      Objective:    Physical Exam General: AAO x3, NAD  Dermatological: Ulceration noted to the distal aspect of the left foot with malodor.  There is no fluctuation or crepitation.  Vascular: Dorsalis Pedis artery and Posterior Tibial artery pedal pulses are palpable bilateral with immedate capillary fill time.There is no pain with calf compression, swelling, warmth, erythema.   Neruologic: Sensation decreased  Musculoskeletal: He has diffuse tenderness to the left foot as well as to the leg and there is edema present to the foot and leg with increased temperature noted.    No images are attached to the encounter.    Results RADIOLOGY Right foot X-ray: Osteomyelitis suggested along the distal phalanx.  (07/27/2023)   Assessment:   1. Foot ulcer with fat layer exposed, left (HCC)   2. Osteomyelitis of great toe of left foot (HCC)      Plan:  Patient was evaluated and treated and all questions answered.  Assessment and  Plan Assessment & Plan Osteomyelitis of toe Suspected osteomyelitis due to recent infection, exacerbated by footwear. Persistent symptoms and radiographic changes suggest bone infection. -Recommend the patient to go to the emergency room given the infection and likely to be admitted for the infection.  [facility] emergency department. - Admit for IV antibiotics. - Order MRI of affected toe to assess infection extent. - Coordinate with [facility] for admission. - Podiatry will follow while admitted.    No follow-ups on file.   Charity Conch DPM

## 2023-07-27 NOTE — H&P (Signed)
 History and Physical    Patient: Aaron Fox [id number]:[id number] DOB: 1955-07-26 DOA: 07/27/2023 DOS: the patient was seen and examined on 07/27/2023 PCP: Austine Lefort, MD  Patient coming from: Podiatrist's office  Chief Complaint:  Chief Complaint  Patient presents with   Nail Problem   HPI: Aaron Fox is a 68 y.o. male with medical history significant for DM, HTN, Cellulitis of left upper extremity, onychomycosis, hyperlipidemia, Morbid obesity. The patient states that 2 weeks ago he began wearing a new pair of steel toed shoes to work. He soon found them uncomfortable, but continued to wear them. He went to his podiatrist today as the toe looked strange and had a foul odor. The patient states that the toe itself never really felt painful. He states that it is not painful now. On imagery the toe was found to have changes in the toe consistent with osteomyelitis. He was directed to come to the ED for evaluation and treatment of osteomyelitis of the left great toe.  He denies fevers, chills, nausea, vomiting, hematemesis, melena, or hematochezia. No neurological changes. No headaches. No rhinorhea, sore throat, or cough. No chest pain, shortness of breath or diaphoresis. No diarrhea of constipation. No abdominal pain.   Review of Systems: As mentioned in the history of present illness. All other systems reviewed and are negative. 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 Lenna Quinton   CATARACT EXTRACTION, BILATERAL     COLONOSCOPY  2004   LYMPH NODE BIOPSY Right 10/16/2014   Procedure: RIGHT INGUINAL LYMPH NODE BIOPSY;  Surgeon: Alanda Allegra, MD;  Location: [facility];  Service: General;  Laterality: Right;   Social History:  reports that he quit smoking about 23 years ago. His smoking use included cigarettes. He has never used smokeless tobacco. He reports that he does not drink alcohol and does not use drugs.  Allergies  Allergen Reactions   Penicillins Swelling and Other (See Comments)    Swelling of hands and feet Skin peeling   Lamisil [Terbinafine] Hives   Zestril [Lisinopril] Cough   Cipro  [Ciprofloxacin  Hcl] Hives and Rash    Family History  Problem Relation Age of Onset   Heart disease Other        FH of CAD   Colon polyps Paternal Uncle    Kidney disease  Paternal Uncle    Diabetes Paternal Grandfather    Diabetes Maternal Uncle    Cancer Neg Hx    Colon cancer Neg Hx    Gallbladder disease Neg Hx    Esophageal cancer Neg Hx    Rectal cancer Neg Hx    Stomach cancer Neg Hx     Prior to Admission medications   Medication Sig Start Date End Date Taking? Authorizing Provider  aspirin  81 MG tablet Take 81 mg by mouth daily.   Yes [provider]  atorvastatin  (LIPITOR) 10 MG tablet TAKE 1 TABLET EVERY DAY 08/30/22  Yes Austine Lefort, MD  doxycycline  (VIBRA -TABS) 100  MG tablet Take 1 tablet (100 mg total) by mouth 2 (two) times daily. 07/24/23  Yes Charity Conch, DPM  gabapentin  (NEURONTIN ) 300 MG capsule TAKE 2 CAPSULES THREE TIMES DAILY 01/08/23  Yes Austine Lefort, MD  insulin  isophane & regular human KwikPen (NOVOLIN  70/30 KWIKPEN) (70-30) 100 UNIT/ML KwikPen Inject 10 Units into the skin 2 (two) times daily before a meal. 06/02/22  Yes Wendel Hals, NP  losartan  (COZAAR ) 50 MG tablet TAKE 1 TABLET EVERY DAY 12/22/21  Yes Austine Lefort, MD  Multiple Vitamins-Minerals (MULTIVITAMIN MEN 50+) TABS Take 1 tablet by mouth daily.   Yes [provider]  Tetrahydrozoline HCl (VISINE OP) Place 1 drop into both eyes daily as needed (redness).   Yes [provider]  triamcinolone  cream (KENALOG ) 0.1 % APPLY TO THE AFFECTED AREA(S) TOPICALLY THREE TIMES DAILY AS NEEDED FOR RASH Patient taking differently: Apply 1 Application topically 2 (two) times daily as needed (skin irritation). 07/19/22  Yes Jenelle Mis, FNP  Accu-Chek Softclix Lancets lancets TEST BLOOD SUGAR 7 TIMES DAILY 08/30/22   Emilie Harden, MD  glucose blood (ACCU-CHEK GUIDE TEST) test strip Use as instructed to monitor glucose 4 times daily 02/02/23   Wendel Hals, NP  INSULIN  SYRINGE 1CC/29G (DROPLET INSULIN  SYRINGE) 29G X 1/2" 1 ML MISC Use to inject in morning and bedtime 02/10/22   Shamleffer, Julian Obey, MD    Physical Exam: Vitals:   07/27/23 0821 07/27/23 1000 07/27/23 1517  BP: 128/66  128/61  Pulse: 74  (!) 58  Resp: 16  16  Temp: 98.3 F (36.8 C)  97.6 F (36.4 C)  TempSrc: Oral  Oral  SpO2: 97%  100%  Weight:  106.7 kg    Exam:  Constitutional:  The patient is awake, alert, and oriented x 3. No acute distress. Eyes:  pupils and irises appear normal Normal lids and conjunctivae ENMT:  grossly normal hearing  Lips appear normal external ears, nose appear normal Oropharynx: mucosa, tongue,posterior pharynx appear normal Neck:   neck appears normal, no masses, normal ROM, supple no thyromegaly Respiratory:  No increased work of breathing. No wheezes, rales, or rhonchi No tactile fremitus Cardiovascular:  Regular rate and rhythm No murmurs, ectopy, or gallups. No lateral PMI. No thrills. Abdomen:  Abdomen is soft, non-tender, non-distended No hernias, masses, or organomegaly Normoactive bowel sounds.  Musculoskeletal:  No cyanosis, clubbing, or edema Left great toe is swollen, discolored and has a foul odor. There seems to be a small amount of drainage.  Skin:  No rashes, lesions, ulcers palpation of skin: no induration or nodules Neurologic:  CN 2-12 intact Sensation all 4 extremities intact Psychiatric:  Mental status Mood, affect appropriate Orientation to person, place, time  judgment and insight appear intact  Data Reviewed:  X-ray of left foot CBC BMP  Assessment and Plan: Osteomyelitis of left great toe: The patient has been admitted to a medical bed. Blood cultures x 2 have been obtained. He has been started on IV Ceftriaxone, metronidazole, and vancomycin . Podiatry to follow. ABI's have been ordered as has an MRI of the left foot to guide further therapy.  DM II:  At home the patient's glucoses are managed with 70/30 bid. We will continue this regimen along with correction lispro. Will check a HbA1c and monitor glucoses. Insulin  regimen will be adjusted as necessary.  Hyperlipidemia:  Continue Lipitor 10 mg as at home.  Hypertension: At home his blood pressures are controlled on Cozaar . This will be continued.   I have seen and examined this patient myself. I have spent 52 minutes in his evaluation and care.   Advance Care Planning:   Code Status: Full Code   Consults: Podiatry  Family Communication: Family at bedside. All questions answered as possible.  Severity of Illness: The patient is appropriate for inpatient status due to the liklihood of infection of the bone of  the left great toe, the possibility of bacteremic spread of the infection and the likely need for surgery, long term antibiotics, and investigation of possible cardiac sequelae.   Author: Vashaun Osmon, DO 07/27/2023 7:57 PM  For on call review [email or web address].

## 2023-07-27 NOTE — Consult Note (Signed)
 PODIATRY CONSULTATION  NAME Aaron Fox MRN [id number] DOB 05-Sep-1955 DOA 07/27/2023   Reason for consult: Left hallux ulceration  Attending/Consulting physician: Dr. Nora Beal  History of present illness: 68 y.o. male with history of type 1 diabetes with peripheral neuropathy, HLD, HTN, sent to the ED due to concern for Left hallux ulceration and concern for underlying osteomyelitis. Saw Dr. Clydia Dart this AM and he was concerned about the wound as well as edema in the LLE. Started due to rubbing of toe in shoe 2/2 neuropathy.   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   Lymphadenopathy    Peripheral neuropathy    PUD (peptic ulcer disease)    Rheumatoid arthritis(714.0) 09/22/2006       Latest Ref Rng & Units 03/06/2022    4:28 PM 08/27/2021   10:08 AM 05/07/2020    8:16 AM  CBC  WBC 3.8 - 10.8 Thousand/uL 4.7  4.4  3.5   Hemoglobin 13.2 - 17.1 g/dL 88.4  16.6  06.3   Hematocrit 38.5 - 50.0 % 35.7  35.6  38.5   Platelets 140 - 400 Thousand/uL 168  186  240        Latest Ref Rng & Units 04/20/2022    8:09 AM 03/06/2022    4:28 PM 08/27/2021   10:08 AM  BMP  Glucose 65 - 99 mg/dL  016  010   BUN 7 - 25 mg/dL  11  18   Creatinine 9.32 - 1.24 mg/dL 3.55  7.32  2.02   BUN/Creat Ratio 6 - 22 (calc)  SEE NOTE:    Sodium 135 - 146 mmol/L  141  137   Potassium 3.5 - 5.3 mmol/L  4.6  4.0   Chloride 98 - 110 mmol/L  107  109   CO2 20 - 32 mmol/L  25   22   Calcium  8.6 - 10.3 mg/dL  9.5  8.9       Physical Exam: Lower Extremity Exam General: AAO x3, NAD   Dermatological: Ulceration noted to the distal aspect of the left great toe with malodor.  There is no fluctuation or crepitation. Dark discoloration of the distal hallux.   Vascular: Dorsalis Pedis artery and Posterior Tibial artery pedal pulses are non palpable on left foot 2/2 edema, does feel warm. There is no pain with calf compression, swelling, warmth, erythema.    Neruologic: Sensation decreased   Musculoskeletal: He has diffuse tenderness to the left foot as well as to the leg and there is edema present to the foot and leg with increased temperature noted.  XR Foot Left 3 views: Question osseous erosion at the distal tuft of the left distal phalanx concerning for osteomyelitis  ASSESSMENT/PLAN OF CARE 68 y.o. male with PMHx significant for  T1DM with neuroapthy with Left hallux ulceration with concern for underlying osteomyelitis.  WBC  4.9  MRI Toes L WO contrast: pending  - Recommend MRI L toes to eval for extent of hallux osteomyelitis. Surgical plan likely for Monday unless vascular eval/intervention needed. Npo p MN Sunday night.  - Recommend check ABI given toe ulcer DM, difficult to palpate DP/PT pulses - Continue IV abx broad spectrum pending further culture data - Anticoagulation: Hold day prior to OR  - Wound care: Betadine gauze dsg to left foot - WB status: WBAT in post op shoe - Will continue to follow, Dr. Michalene Agee will see over the weekend   Thank you for the consult.  Please contact me directly with any questions or concerns.           Maridee Shoemaker, DPM [facility] / [facility]    [address] [place], [place] [address]                Office [phone]  Fax [phone]

## 2023-07-28 ENCOUNTER — Encounter (HOSPITAL_COMMUNITY)

## 2023-07-28 DIAGNOSIS — M869 Osteomyelitis, unspecified: Secondary | ICD-10-CM | POA: Diagnosis not present

## 2023-07-28 LAB — CBC WITH DIFFERENTIAL/PLATELET
Abs Immature Granulocytes: 0.02 10*3/uL (ref 0.00–0.07)
Basophils Absolute: 0 10*3/uL (ref 0.0–0.1)
Basophils Relative: 1 %
Eosinophils Absolute: 0.3 10*3/uL (ref 0.0–0.5)
Eosinophils Relative: 6 %
HCT: 31.6 % — ABNORMAL LOW (ref 39.0–52.0)
Hemoglobin: 10.7 g/dL — ABNORMAL LOW (ref 13.0–17.0)
Immature Granulocytes: 0 %
Lymphocytes Relative: 32 %
Lymphs Abs: 1.5 10*3/uL (ref 0.7–4.0)
MCH: 28.6 pg (ref 26.0–34.0)
MCHC: 33.9 g/dL (ref 30.0–36.0)
MCV: 84.5 fL (ref 80.0–100.0)
Monocytes Absolute: 0.6 10*3/uL (ref 0.1–1.0)
Monocytes Relative: 13 %
Neutro Abs: 2.3 10*3/uL (ref 1.7–7.7)
Neutrophils Relative %: 48 %
Platelets: 244 10*3/uL (ref 150–400)
RBC: 3.74 MIL/uL — ABNORMAL LOW (ref 4.22–5.81)
RDW: 13.1 % (ref 11.5–15.5)
WBC: 4.8 10*3/uL (ref 4.0–10.5)
nRBC: 0 % (ref 0.0–0.2)

## 2023-07-28 LAB — COMPREHENSIVE METABOLIC PANEL WITH GFR
ALT: 19 U/L (ref 0–44)
AST: 21 U/L (ref 15–41)
Albumin: 2.8 g/dL — ABNORMAL LOW (ref 3.5–5.0)
Alkaline Phosphatase: 57 U/L (ref 38–126)
Anion gap: 5 (ref 5–15)
BUN: 28 mg/dL — ABNORMAL HIGH (ref 8–23)
CO2: 23 mmol/L (ref 22–32)
Calcium: 8.9 mg/dL (ref 8.9–10.3)
Chloride: 109 mmol/L (ref 98–111)
Creatinine, Ser: 1.17 mg/dL (ref 0.61–1.24)
GFR, Estimated: >60 mL/min (ref >60)
Glucose, Bld: 149 mg/dL — ABNORMAL HIGH (ref 70–99)
Potassium: 4.9 mmol/L (ref 3.5–5.1)
Sodium: 137 mmol/L (ref 135–145)
Total Bilirubin: 0.6 mg/dL (ref 0.0–1.2)
Total Protein: 6.8 g/dL (ref 6.5–8.1)

## 2023-07-28 LAB — GLUCOSE, CAPILLARY
Glucose-Capillary: 139 mg/dL — ABNORMAL HIGH (ref 70–99)
Glucose-Capillary: 141 mg/dL — ABNORMAL HIGH (ref 70–99)
Glucose-Capillary: 103 mg/dL — ABNORMAL HIGH (ref 70–99)
Glucose-Capillary: 158 mg/dL — ABNORMAL HIGH (ref 70–99)
Glucose-Capillary: 67 mg/dL — ABNORMAL LOW (ref 70–99)
Glucose-Capillary: 138 mg/dL — ABNORMAL HIGH (ref 70–99)

## 2023-07-28 MED ORDER — VANCOMYCIN HCL 1500 MG/300ML IV SOLN
1500.0000 mg | INTRAVENOUS | Status: DC
  Administered 2023-07-28 – 2023-07-30 (×3): 1500 mg via INTRAVENOUS
  Filled 2023-07-28 (×4): qty 300

## 2023-07-28 NOTE — Progress Notes (Signed)
 Pharmacy Antibiotic Note  Aaron Fox is a 68 y.o. male admitted on 07/27/2023 with L great toe osteomyelitis.  Patient continues on Vancomycin , Rocephin , and Flagyl .  Renal function has improved requiring Vancomycin  dose adjustment.  SCr 1.38 > 1.17 (baseline 1.1)  Plan: Change Vancomycin  to 1500 mg IV q24h (eAUC 473, Vd 0.5, Scr 1.17) Ceftriaxone  2 g IV q24h  Flagyl  500 mg q12h  Monitor renal function, signs of clinical improvement, and CBC Vancomycin  levels as indicated   Weight: 106.7 kg (235 lb 3.7 oz)  Temp (24hrs), Avg:98 F (36.7 C), Min:97.6 F (36.4 C), Max:98.4 F (36.9 C)  Recent Labs  Lab 07/27/23 0836 07/27/23 1008 07/28/23 0452  WBC 4.9  --  4.8  CREATININE 1.38*  --  1.17  LATICACIDVEN  --  1.0  --     Estimated Creatinine Clearance: 76.8 mL/min (by C-G formula based on SCr of 1.17 mg/dL).    Allergies  Allergen Reactions   Penicillins Swelling and Other (See Comments)    Swelling of hands and feet Skin peeling   Lamisil [Terbinafine] Hives   Zestril [Lisinopril] Cough   Cipro  [Ciprofloxacin  Hcl] Hives and Rash    Antimicrobials this admission: CTX 5/30 >> Vanc 5/30 >> Flagyl  5/30 >>   Microbiology results: 5/30 BCx: ngtd  Thank you for allowing pharmacy to be a part of this patient's care.  [employer], Pharm.D., BCPS Clinical Pharmacist  **Pharmacist phone directory can be found on [email or web address] listed under [facility].  07/28/2023 11:03 AM

## 2023-07-28 NOTE — Plan of Care (Signed)

## 2023-07-28 NOTE — Progress Notes (Addendum)
  Subjective:  Patient ID: Aaron Fox, male    DOB: May 06, 1955,  MRN: [id number]  Feeling well no issues overnight  Negative for chest pain and shortness of breath Fever: no Night sweats: no Weight loss: no Constitutional signs: no Objective:   Vitals:   07/28/23 0415 07/28/23 0752  BP: 116/62 118/61  Pulse: 61 68  Resp: 18   Temp: 98 F (36.7 C) 98 F (36.7 C)  SpO2: 94% 94%   General AA&O x3. Normal mood and affect.  Vascular Dorsalis pedis and posterior tibial pulses weak, foot warmer at ankle than toes  Neurologic Epicritic sensation grossly reduced.  Dermatologic Full-thickness ulcer with necrosis and drainage distal hallux left  Orthopedic: MMT 5/5 in dorsiflexion, plantarflexion, inversion, and eversion. Normal joint ROM without pain or crepitus.   IMPRESSION: 1. Findings compatible with osteomyelitis of the distal phalanx of the great toe. Trace fluid in the interphalangeal joint of the great toe, septic arthritis can not be excluded. 2. Soft tissue ulceration at the tip of the first distal phalanx. No fluid collection. 3. Degenerative changes of the forefoot, as above.     Electronically Signed   By: Mannie Seek M.D.   On: 07/27/2023 14:12    Assessment & Plan:  Patient was evaluated and treated and all questions answered.  Osteomyelitis left hallux, PAD, diabetes - Continue broad-spectrum antibiotics for now -Plan for OR for left hallux partial amputation Monday with Dr. Rosemarie Conquest -ABIs ordered and scheduled for today -Weightbearing as tolerated -Place dressing with Xeroform 4 x 4's Kling and tape  Floyce Hutching, DPM  Accessible via secure chat for questions or concerns.

## 2023-07-28 NOTE — Progress Notes (Signed)
  Progress Note   Patient: Aaron Fox WUJ:[id number] DOB: 20-Jun-1955 DOA: 07/27/2023     1 DOS: the patient was seen and examined on 07/28/2023   Brief hospital course: No notes on file  Assessment and Plan: No notes have been filed under this hospital service. Service: Hospitalist     {Tip this will not be part of the note when signed Body mass index is 32.35 kg/m. ,  Nutrition Documentation    Flowsheet Row ED to Hosp-Admission (Current) from 07/27/2023 in [facility] GENERAL MED/SURG UNIT  Nutrition Problem Increased nutrient needs  Etiology post-op healing  Nutrition Goal Patient will meet greater than or equal to 90% of their needs  Interventions MVI, Premier Protein, Juven     ,  (Optional):26781}  Subjective: ***  Physical Exam: Vitals:   07/27/23 1517 07/27/23 2025 07/28/23 0415 07/28/23 0752  BP: 128/61 (!) 120/58 116/62 118/61  Pulse: (!) 58 67 61 68  Resp: 16 17 18    Temp: 97.6 F (36.4 C) 98.4 F (36.9 C) 98 F (36.7 C) 98 F (36.7 C)  TempSrc: Oral Oral Oral   SpO2: 100% 94% 94% 94%  Weight:       *** Data Reviewed: {Tip this will not be part of the note when signed- Document your independent interpretation of telemetry tracing, EKG, lab, Radiology test or any other diagnostic tests. Add any new diagnostic test ordered today. (Optional):26781} {Results:26384}  Family Communication: ***  Disposition: Status is: Inpatient {Inpatient:23812}  Planned Discharge Destination: {DISCHARGE DESTINATION_TRH:27031} {Tip this will not be part of the note when signed  DVT Prophylaxis  ., Enoxaparin  (lovenox ) injection 40 mg  (Optional):26781}   Time spent: *** minutes  Author: Keiland Pickering, DO 07/28/2023 7:04 PM  For on call review [email or web address].

## 2023-07-29 ENCOUNTER — Inpatient Hospital Stay (HOSPITAL_COMMUNITY)

## 2023-07-29 ENCOUNTER — Encounter (HOSPITAL_COMMUNITY): Payer: Self-pay

## 2023-07-29 DIAGNOSIS — M869 Osteomyelitis, unspecified: Secondary | ICD-10-CM | POA: Diagnosis not present

## 2023-07-29 LAB — CBC WITH DIFFERENTIAL/PLATELET
Abs Immature Granulocytes: 0.01 10*3/uL (ref 0.00–0.07)
Basophils Absolute: 0.1 10*3/uL (ref 0.0–0.1)
Basophils Relative: 1 %
Eosinophils Absolute: 0.3 10*3/uL (ref 0.0–0.5)
Eosinophils Relative: 6 %
HCT: 32.1 % — ABNORMAL LOW (ref 39.0–52.0)
Hemoglobin: 10.8 g/dL — ABNORMAL LOW (ref 13.0–17.0)
Immature Granulocytes: 0 %
Lymphocytes Relative: 26 %
Lymphs Abs: 1.2 10*3/uL (ref 0.7–4.0)
MCH: 28.3 pg (ref 26.0–34.0)
MCHC: 33.6 g/dL (ref 30.0–36.0)
MCV: 84.3 fL (ref 80.0–100.0)
Monocytes Absolute: 0.6 10*3/uL (ref 0.1–1.0)
Monocytes Relative: 13 %
Neutro Abs: 2.5 10*3/uL (ref 1.7–7.7)
Neutrophils Relative %: 54 %
Platelets: 237 10*3/uL (ref 150–400)
RBC: 3.81 MIL/uL — ABNORMAL LOW (ref 4.22–5.81)
RDW: 13 % (ref 11.5–15.5)
WBC: 4.7 10*3/uL (ref 4.0–10.5)
nRBC: 0 % (ref 0.0–0.2)

## 2023-07-29 LAB — BASIC METABOLIC PANEL WITH GFR
Anion gap: 9 (ref 5–15)
BUN: 27 mg/dL — ABNORMAL HIGH (ref 8–23)
CO2: 21 mmol/L — ABNORMAL LOW (ref 22–32)
Calcium: 9 mg/dL (ref 8.9–10.3)
Chloride: 107 mmol/L (ref 98–111)
Creatinine, Ser: 1.15 mg/dL (ref 0.61–1.24)
GFR, Estimated: >60 mL/min (ref >60)
Glucose, Bld: 132 mg/dL — ABNORMAL HIGH (ref 70–99)
Potassium: 4.5 mmol/L (ref 3.5–5.1)
Sodium: 137 mmol/L (ref 135–145)

## 2023-07-29 LAB — GLUCOSE, CAPILLARY
Glucose-Capillary: 130 mg/dL — ABNORMAL HIGH (ref 70–99)
Glucose-Capillary: 219 mg/dL — ABNORMAL HIGH (ref 70–99)
Glucose-Capillary: 74 mg/dL (ref 70–99)
Glucose-Capillary: 142 mg/dL — ABNORMAL HIGH (ref 70–99)
Glucose-Capillary: 101 mg/dL — ABNORMAL HIGH (ref 70–99)

## 2023-07-29 NOTE — Plan of Care (Signed)
 Pt alert and oriented x4. sitting in the bedside recliner. Pt's spouse visited this pm. No complaints of pain. Lt great toe with swelling. No drainage noted. Tolerated po meds. Blood glucose being monitored. Pt is ambulatory within the room. Encouraged to call for assistance as needed. Call light in reach. Sr x2 elevated. Bed in low position.,

## 2023-07-29 NOTE — Anesthesia Preprocedure Evaluation (Addendum)
 Anesthesia Evaluation    Reviewed: Allergy & Precautions, Patient's Chart, lab work & pertinent test results  Airway Mallampati: II  TM Distance: >3 FB Neck ROM: Full    Dental  (+) Poor Dentition, Edentulous Upper   Pulmonary neg shortness of breath, former smoker   Pulmonary exam normal breath sounds clear to auscultation       Cardiovascular hypertension, +CHF  (-) Past MI Normal cardiovascular exam Rhythm:Regular Rate:Normal     Neuro/Psych neg Seizures    GI/Hepatic PUD,,,  Endo/Other  diabetes, Type 2, Insulin  Dependent    Renal/GU      Musculoskeletal  (+) Arthritis , Rheumatoid disorders,    Abdominal   Peds  Hematology  (+) Blood dyscrasia, anemia On lovenox    Anesthesia Other Findings All: Cipro  PCN, Lamisil, Zestril  Reproductive/Obstetrics                             Anesthesia Physical Anesthesia Plan  ASA: 3  Anesthesia Plan: MAC   Post-op Pain Management: Minimal or no pain anticipated   Induction: Intravenous  PONV Risk Score and Plan: 2 and Treatment may vary due to age or medical condition, Midazolam , Ondansetron , Dexamethasone and Propofol  infusion  Airway Management Planned: Nasal Cannula and Natural Airway  Additional Equipment: None  Intra-op Plan:   Post-operative Plan:   Informed Consent:      Dental advisory given  Plan Discussed with: CRNA and Surgeon  Anesthesia Plan Comments:        Anesthesia Quick Evaluation

## 2023-07-29 NOTE — Plan of Care (Signed)

## 2023-07-29 NOTE — Progress Notes (Signed)
 Progress Note   Patient: Aaron Fox:[id number] DOB: March 12, 1955 DOA: 07/27/2023     2 DOS: the patient was seen and examined on 07/29/2023   Brief hospital course:  Shelley DURANTE VIOLETT is a 68 y.o. male with medical history significant for DM, HTN, Cellulitis of left upper extremity, onychomycosis, hyperlipidemia, Morbid obesity. The patient states that 2 weeks ago he began wearing a new pair of steel toed shoes to work. He soon found them uncomfortable, but continued to wear them. He went to his podiatrist today as the toe looked strange and had a foul odor. The patient states that the toe itself never really felt painful. He states that it is not painful now. On imagery the toe was found to have changes in the toe consistent with osteomyelitis. He was directed to come to the ED for evaluation and treatment of osteomyelitis of the left great toe.   He denies fevers, chills, nausea, vomiting, hematemesis, melena, or hematochezia. No neurological changes. No headaches. No rhinorhea, sore throat, or cough. No chest pain, shortness of breath or diaphoresis. No diarrhea of constipation. No abdominal pain.   The patient was admitted to a med/surg bed. MRI has confirmed osteomyelitis of the left great toe. Plan is for surgery on Monday per podiatry. ABI's are pending.  The patient is doing well and awaiting surgery on 07/30/2023.  Assessment and Plan:  Osteomyelitis of left great toe: The patient has been admitted to a medical bed. Blood cultures x 2 have been obtained. He has been started on IV Ceftriaxone , metronidazole , and vancomycin . Podiatry to follow. ABI'sare pending. MRI of the left foot confirmed osteomyelitis of the left great toe. Per podiatry surgery is pending for 07/30/2023.   DM II:  At home the patient's glucoses are managed with 70/30 bid. We will continue this regimen along with correction lispro. Will check a HbA1c and monitor glucoses. Insulin  regimen will be adjusted as  necessary. Glucoses for the past 24 hours appear to be under good control.   Hyperlipidemia:  Continue Lipitor 10 mg as at home.   Hypertension: At home his blood pressures are controlled on Cozaar . This will be continued.    I have seen and examined this patient myself. I have spent 34 minutes in his evaluation and care.    Advance Care Planning:   Code Status: Full Code    Consults: Podiatry   Family Communication: Family at bedside. All questions answered as possible.   Severity of Illness: The patient is appropriate for inpatient status due to the liklihood of infection of the bone of the left great toe, the possibility of bacteremic spread of the infection and the likely need for surgery, long term antibiotics, and investigation of possible cardiac sequelae.        Subjective: The patient is resting quietly. No new complaints.   Physical Exam: Vitals:   07/29/23 0449 07/29/23 0451 07/29/23 0809 07/29/23 1527  BP: 121/73 115/66 (!) 117/59 132/61  Pulse: 73 64 64 65  Resp: 20 20 18 18   Temp: 99 F (37.2 C) 99 F (37.2 C) 98.1 F (36.7 C) (!) 97.4 F (36.3 C)  TempSrc: Oral Oral Oral Oral  SpO2: 94% 96% 97% 97%  Weight:       Exam:  Constitutional:  The patient is awake, alert, and oriented x 3. No acute distress. Respiratory:  No increased work of breathing. No wheezes, rales, or rhonchi No tactile fremitus Cardiovascular:  Regular rate and rhythm No murmurs, ectopy,  or gallups. No lateral PMI. No thrills. Abdomen:  Abdomen is soft, non-tender, non-distended No hernias, masses, or organomegaly Normoactive bowel sounds.  Musculoskeletal:  No cyanosis, clubbing, or edema Left great toe with ulceration and discoloration. Skin:  No rashes, lesions, ulcers palpation of skin: no induration or nodules Neurologic:  CN 2-12 intact Sensation all 4 extremities intact Psychiatric:  Mental status Mood, affect appropriate Orientation to person, place, time   judgment and insight appear intact  Data Reviewed:  CBC BMP  Family Communication: None available  Disposition: Status is: Inpatient Remains inpatient appropriate because: Need for work of osteomyelitis in great toe with high risk of vascular etiology. Need for surgical intervention and IV antibiotics.  Planned Discharge Destination: Home    Time spent: 34 minutes  Author: Waverley Krempasky, DO 07/29/2023 6:44 PM  For on call review [email or web address].

## 2023-07-30 ENCOUNTER — Inpatient Hospital Stay (HOSPITAL_COMMUNITY): Payer: Self-pay | Admitting: Anesthesiology

## 2023-07-30 ENCOUNTER — Encounter (HOSPITAL_COMMUNITY)
Admission: EM | Disposition: A | Discharge status: Home / Self Care | Payer: Self-pay | Source: Ambulatory Visit | Attending: Internal Medicine

## 2023-07-30 ENCOUNTER — Encounter (HOSPITAL_COMMUNITY): Payer: Self-pay | Admitting: Internal Medicine

## 2023-07-30 ENCOUNTER — Encounter (HOSPITAL_COMMUNITY)

## 2023-07-30 ENCOUNTER — Other Ambulatory Visit: Payer: Self-pay

## 2023-07-30 ENCOUNTER — Inpatient Hospital Stay (HOSPITAL_COMMUNITY)

## 2023-07-30 DIAGNOSIS — E1169 Type 2 diabetes mellitus with other specified complication: Secondary | ICD-10-CM | POA: Diagnosis not present

## 2023-07-30 DIAGNOSIS — I509 Heart failure, unspecified: Secondary | ICD-10-CM

## 2023-07-30 DIAGNOSIS — I110 Hypertensive heart disease with heart failure: Secondary | ICD-10-CM | POA: Diagnosis not present

## 2023-07-30 DIAGNOSIS — M869 Osteomyelitis, unspecified: Secondary | ICD-10-CM | POA: Diagnosis not present

## 2023-07-30 DIAGNOSIS — M86172 Other acute osteomyelitis, left ankle and foot: Secondary | ICD-10-CM | POA: Diagnosis not present

## 2023-07-30 HISTORY — PX: AMPUTATION TOE: SHX6595

## 2023-07-30 LAB — CBC WITH DIFFERENTIAL/PLATELET
Abs Immature Granulocytes: 0.01 10*3/uL (ref 0.00–0.07)
Basophils Absolute: 0.1 10*3/uL (ref 0.0–0.1)
Basophils Relative: 2 %
Eosinophils Absolute: 0.3 10*3/uL (ref 0.0–0.5)
Eosinophils Relative: 7 %
HCT: 31.3 % — ABNORMAL LOW (ref 39.0–52.0)
Hemoglobin: 10.6 g/dL — ABNORMAL LOW (ref 13.0–17.0)
Immature Granulocytes: 0 %
Lymphocytes Relative: 32 %
Lymphs Abs: 1.3 10*3/uL (ref 0.7–4.0)
MCH: 28.7 pg (ref 26.0–34.0)
MCHC: 33.9 g/dL (ref 30.0–36.0)
MCV: 84.8 fL (ref 80.0–100.0)
Monocytes Absolute: 0.5 10*3/uL (ref 0.1–1.0)
Monocytes Relative: 12 %
Neutro Abs: 2 10*3/uL (ref 1.7–7.7)
Neutrophils Relative %: 47 %
Platelets: 249 10*3/uL (ref 150–400)
RBC: 3.69 MIL/uL — ABNORMAL LOW (ref 4.22–5.81)
RDW: 12.8 % (ref 11.5–15.5)
WBC: 4.1 10*3/uL (ref 4.0–10.5)
nRBC: 0 % (ref 0.0–0.2)

## 2023-07-30 LAB — BASIC METABOLIC PANEL WITH GFR
Anion gap: 8 (ref 5–15)
BUN: 32 mg/dL — ABNORMAL HIGH (ref 8–23)
CO2: 20 mmol/L — ABNORMAL LOW (ref 22–32)
Calcium: 8.8 mg/dL — ABNORMAL LOW (ref 8.9–10.3)
Chloride: 107 mmol/L (ref 98–111)
Creatinine, Ser: 1.27 mg/dL — ABNORMAL HIGH (ref 0.61–1.24)
GFR, Estimated: >60 mL/min (ref >60)
Glucose, Bld: 125 mg/dL — ABNORMAL HIGH (ref 70–99)
Potassium: 4.3 mmol/L (ref 3.5–5.1)
Sodium: 135 mmol/L (ref 135–145)

## 2023-07-30 LAB — GLUCOSE, CAPILLARY
Glucose-Capillary: 141 mg/dL — ABNORMAL HIGH (ref 70–99)
Glucose-Capillary: 142 mg/dL — ABNORMAL HIGH (ref 70–99)
Glucose-Capillary: 162 mg/dL — ABNORMAL HIGH (ref 70–99)
Glucose-Capillary: 308 mg/dL — ABNORMAL HIGH (ref 70–99)
Glucose-Capillary: 311 mg/dL — ABNORMAL HIGH (ref 70–99)

## 2023-07-30 SURGERY — AMPUTATION, TOE
Anesthesia: Regional | Site: Toe | Laterality: Left

## 2023-07-30 MED ORDER — ONDANSETRON HCL 4 MG/2ML IJ SOLN
INTRAMUSCULAR | Status: DC | PRN
  Administered 2023-07-30: 4 mg via INTRAVENOUS

## 2023-07-30 MED ORDER — LIDOCAINE 2% (20 MG/ML) 5 ML SYRINGE
INTRAMUSCULAR | Status: AC
Start: 2023-07-30 — End: ?
  Filled 2023-07-30: qty 5

## 2023-07-30 MED ORDER — ONDANSETRON HCL 4 MG/2ML IJ SOLN: 4.0000 mg | Freq: Once | INTRAMUSCULAR | Status: DC | PRN

## 2023-07-30 MED ORDER — ONDANSETRON HCL 4 MG/2ML IJ SOLN
INTRAMUSCULAR | Status: AC
  Filled 2023-07-30: qty 2

## 2023-07-30 MED ORDER — LIDOCAINE HCL (CARDIAC) PF 100 MG/5ML IV SOSY
PREFILLED_SYRINGE | INTRAVENOUS | Status: DC | PRN
  Administered 2023-07-30: 40 mg via INTRAVENOUS

## 2023-07-30 MED ORDER — DEXAMETHASONE SODIUM PHOSPHATE 10 MG/ML IJ SOLN
INTRAMUSCULAR | Status: DC | PRN
  Administered 2023-07-30: 5 mg via INTRAVENOUS

## 2023-07-30 MED ORDER — PROPOFOL 500 MG/50ML IV EMUL
INTRAVENOUS | Status: DC | PRN
  Administered 2023-07-30: 50 ug/kg/min via INTRAVENOUS
  Administered 2023-07-30 (×3): 20 mg via INTRAVENOUS

## 2023-07-30 MED ORDER — SULFAMETHOXAZOLE-TRIMETHOPRIM 800-160 MG PO TABS
2.0000 | ORAL_TABLET | Freq: Two times a day (BID) | ORAL | Status: DC
  Administered 2023-07-30 – 2023-07-31 (×2): 2 via ORAL
  Filled 2023-07-30 (×3): qty 2

## 2023-07-30 MED ORDER — PHENYLEPHRINE 80 MCG/ML (10ML) SYRINGE FOR IV PUSH (FOR BLOOD PRESSURE SUPPORT)
PREFILLED_SYRINGE | INTRAVENOUS | Status: AC
Start: 2023-07-30 — End: ?
  Filled 2023-07-30: qty 10

## 2023-07-30 MED ORDER — PHENYLEPHRINE 80 MCG/ML (10ML) SYRINGE FOR IV PUSH (FOR BLOOD PRESSURE SUPPORT)
PREFILLED_SYRINGE | INTRAVENOUS | Status: DC | PRN
  Administered 2023-07-30 (×2): 80 ug via INTRAVENOUS

## 2023-07-30 MED ORDER — BUPIVACAINE HCL (PF) 0.5 % IJ SOLN
INTRAMUSCULAR | Status: DC | PRN
  Administered 2023-07-30: 5 mL

## 2023-07-30 MED ORDER — LACTATED RINGERS IV SOLN: INTRAVENOUS | Status: DC

## 2023-07-30 MED ORDER — FENTANYL CITRATE (PF) 100 MCG/2ML IJ SOLN: 25.0000 ug | INTRAMUSCULAR | Status: DC | PRN

## 2023-07-30 MED ORDER — MIDAZOLAM HCL 2 MG/2ML IJ SOLN
INTRAMUSCULAR | Status: AC
  Filled 2023-07-30: qty 2

## 2023-07-30 MED ORDER — DEXAMETHASONE SODIUM PHOSPHATE 10 MG/ML IJ SOLN
INTRAMUSCULAR | Status: AC
Start: 2023-07-30 — End: ?
  Filled 2023-07-30: qty 1

## 2023-07-30 MED ORDER — MIDAZOLAM HCL 2 MG/2ML IJ SOLN
INTRAMUSCULAR | Status: DC | PRN
Start: 2023-07-30 — End: 2023-07-30
  Administered 2023-07-30: 2 mg via INTRAVENOUS

## 2023-07-30 MED ORDER — ACETAMINOPHEN 10 MG/ML IV SOLN
1000.0000 mg | Freq: Once | INTRAVENOUS | Status: DC | PRN
Start: 2023-07-30 — End: 2023-07-30

## 2023-07-30 MED ORDER — LIDOCAINE HCL (PF) 1 % IJ SOLN
INTRAMUSCULAR | Status: AC
  Filled 2023-07-30: qty 30

## 2023-07-30 MED ORDER — LIDOCAINE HCL (PF) 1 % IJ SOLN
INTRAMUSCULAR | Status: DC | PRN
  Administered 2023-07-30: 5 mL

## 2023-07-30 MED ORDER — ORAL CARE MOUTH RINSE: 15.0000 mL | Freq: Once | OROMUCOSAL | Status: AC

## 2023-07-30 MED ORDER — SODIUM CHLORIDE 0.9 % IR SOLN
Status: DC | PRN
  Administered 2023-07-30: 1000 mL

## 2023-07-30 MED ORDER — INSULIN ASPART 100 UNIT/ML IJ SOLN
0.0000 [IU] | Freq: Every day | INTRAMUSCULAR | Status: DC
  Administered 2023-07-30: 4 [IU] via SUBCUTANEOUS

## 2023-07-30 MED ORDER — PROPOFOL 10 MG/ML IV BOLUS
INTRAVENOUS | Status: AC
  Filled 2023-07-30: qty 20

## 2023-07-30 MED ORDER — CHLORHEXIDINE GLUCONATE 0.12 % MT SOLN
OROMUCOSAL | Status: AC
  Administered 2023-07-30: 15 mL via OROMUCOSAL
  Filled 2023-07-30: qty 15

## 2023-07-30 MED ORDER — CHLORHEXIDINE GLUCONATE 0.12 % MT SOLN: 15.0000 mL | Freq: Once | OROMUCOSAL | Status: AC

## 2023-07-30 MED ORDER — BUPIVACAINE HCL (PF) 0.5 % IJ SOLN
INTRAMUSCULAR | Status: AC
  Filled 2023-07-30: qty 30

## 2023-07-30 SURGICAL SUPPLY — 36 items
BLADE AVERAGE 25X9 (BLADE) IMPLANT
BLADE SURG 10 STRL SS (BLADE) ×2 IMPLANT
BLADE SURG 15 STRL LF DISP TIS (BLADE) ×2 IMPLANT
BNDG COHESIVE 3X5 TAN ST LF (GAUZE/BANDAGES/DRESSINGS) ×2 IMPLANT
BNDG COMPR ESMARK 4X3 LF (GAUZE/BANDAGES/DRESSINGS) ×2 IMPLANT
BNDG ELASTIC 3INX 5YD STR LF (GAUZE/BANDAGES/DRESSINGS) ×2 IMPLANT
BNDG ELASTIC 4INX 5YD STR LF (GAUZE/BANDAGES/DRESSINGS) IMPLANT
BNDG GAUZE DERMACEA FLUFF 4 (GAUZE/BANDAGES/DRESSINGS) IMPLANT
CHLORAPREP W/TINT 26 (MISCELLANEOUS) IMPLANT
DRSG ADAPTIC 3X8 NADH LF (GAUZE/BANDAGES/DRESSINGS) IMPLANT
DRSG XEROFORM 1X8 (GAUZE/BANDAGES/DRESSINGS) IMPLANT
ELECTRODE REM PT RTRN 9FT ADLT (ELECTROSURGICAL) ×2 IMPLANT
GAUZE PAD ABD 8X10 STRL (GAUZE/BANDAGES/DRESSINGS) IMPLANT
GAUZE SPONGE 2X2 STRL 8-PLY (GAUZE/BANDAGES/DRESSINGS) IMPLANT
GAUZE SPONGE 4X4 12PLY STRL (GAUZE/BANDAGES/DRESSINGS) ×2 IMPLANT
GAUZE STRETCH 2X75IN STRL (MISCELLANEOUS) ×2 IMPLANT
GAUZE XEROFORM 1X8 LF (GAUZE/BANDAGES/DRESSINGS) ×2 IMPLANT
GLOVE BIO SURGEON STRL SZ7.5 (GLOVE) ×2 IMPLANT
GLOVE BIOGEL PI IND STRL 7.5 (GLOVE) ×2 IMPLANT
GOWN STRL REUS W/ TWL LRG LVL3 (GOWN DISPOSABLE) ×4 IMPLANT
KIT BASIN OR (CUSTOM PROCEDURE TRAY) ×2 IMPLANT
NDL HYPO 25X1 1.5 SAFETY (NEEDLE) ×2 IMPLANT
NEEDLE HYPO 25X1 1.5 SAFETY (NEEDLE) ×1 IMPLANT
PACK ORTHO EXTREMITY (CUSTOM PROCEDURE TRAY) ×2 IMPLANT
PADDING CAST ABS COTTON 4X4 ST (CAST SUPPLIES) ×4 IMPLANT
SET HNDPC FAN SPRY TIP SCT (DISPOSABLE) IMPLANT
SPIKE FLUID TRANSFER (MISCELLANEOUS) IMPLANT
STOCKINETTE 4X48 STRL (DRAPES) IMPLANT
SUT ETHILON 3 0 FSLX (SUTURE) IMPLANT
SUT PROLENE 3 0 PS 2 (SUTURE) IMPLANT
SUT PROLENE 4 0 PS 2 18 (SUTURE) IMPLANT
SYR CONTROL 10ML LL (SYRINGE) ×2 IMPLANT
TUBE CONNECTING 12X1/4 (SUCTIONS) IMPLANT
UNDERPAD 30X36 HEAVY ABSORB (UNDERPADS AND DIAPERS) ×2 IMPLANT
WATER STERILE IRR 1000ML POUR (IV SOLUTION) ×2 IMPLANT
YANKAUER SUCT BULB TIP NO VENT (SUCTIONS) IMPLANT

## 2023-07-30 NOTE — Progress Notes (Signed)
  Progress Note   Patient: Aaron Fox:[id number] DOB: [birth year] DOA: 07/27/2023     3 DOS: the patient was seen and examined on 07/30/2023   Brief hospital course:  Aaron Fox is a 68 y.o. male with medical history significant for DM, HTN, Cellulitis of left upper extremity, onychomycosis, hyperlipidemia, Morbid obesity. The patient states that 2 weeks ago he began wearing a new pair of steel toed shoes to work. He soon found them uncomfortable, but continued to wear them. He went to his podiatrist today as the toe looked strange and had a foul odor. The patient states that the toe itself never really felt painful. He states that it is not painful now. On imagery the toe was found to have changes in the toe consistent with osteomyelitis. He was directed to come to the ED for evaluation and treatment of osteomyelitis of the left great toe.   He denies fevers, chills, nausea, vomiting, hematemesis, melena, or hematochezia. No neurological changes. No headaches. No rhinorhea, sore throat, or cough. No chest pain, shortness of breath or diaphoresis. No diarrhea of constipation. No abdominal pain.   The patient was admitted to a med/surg bed. MRI has confirmed osteomyelitis of the left great toe. Plan is for surgery on Monday per podiatry. ABI's are pending.  The patient is doing well and awaiting surgery on 07/30/2023.  Assessment and Plan:  Osteomyelitis of left great toe: The patient has been admitted to a medical bed. Blood cultures x 2 have been obtained. He has been started on IV Ceftriaxone , metronidazole , and vancomycin . Podiatry to follow. ABI'sare pending. MRI of the left foot confirmed osteomyelitis of the left great toe. S/p partial toe amputation today Will transition to oral abx F/u path Pt/ot consult ABIs pending   DM II:  At home the patient's glucoses are managed with 70/30 bid. We will continue this regimen along with correction lispro.   Insulin  regimen will  be adjusted as necessary. A1c 6.9% Glucoses for the past 24 hours appear to be under good control.   Hyperlipidemia:  Continue Lipitor 10 mg as at home.   Hypertension: At home his blood pressures are controlled on Cozaar . This will be continued.        Advance Care Planning:   Code Status: Full Code    Consults: Podiatry   Family Communication: wife at bedside 6/2         Subjective: The patient is resting quietly. No new complaints. Mild pain left foot  Physical Exam: Vitals:   07/30/23 0950 07/30/23 1000 07/30/23 1015 07/30/23 1036  BP: 98/60 (!) 103/57 106/64 114/61  Pulse: 73 65 63 62  Resp: 13 14 15 17   Temp: 97.9 F (36.6 C)  97.9 F (36.6 C) 97.6 F (36.4 C)  TempSrc:      SpO2: 94% 90% 91% 94%  Weight:      Height:       Exam:  Constitutional:  The patient is awake, alert, and oriented x 3. No acute distress. Respiratory:  No increased work of breathing. No wheezes, rales, or rhonchi  Cardiovascular:  Regular rate and rhythm  Abdomen:  Abdomen is soft, non-tender, non-distended  Musculoskeletal:  Normal bulk Skin:  Left foot wrapped Neurologic:  Moving all 4 Psychiatric:  calm  Data Reviewed:  CBC BMP   Disposition: Status is: Inpatient Remains inpatient appropriate because: monitoring   Author: Raymonde Calico, MD 07/30/2023 2:53 PM  For on call review [email or web address].

## 2023-07-30 NOTE — Progress Notes (Signed)
 Called to get report from floor nurse for patient's surgery. No answer. Transport sent for pt.

## 2023-07-30 NOTE — Care Management Important Message (Signed)
 Important Message  Patient Details  Name: ODEAN FESTER MRN: [id number] Date of Birth: [birth year]   Important Message Given:  Yes - Medicare IM     Wynonia Hedges 07/30/2023, 2:32 PM

## 2023-07-30 NOTE — Telephone Encounter (Signed)
 Requested medications are due for refill today.  yes  Requested medications are on the active medications list.  yes  Last refill. 7/3/20224 #90 3 rf  Future visit scheduled.   no  Notes to clinic.  Labs are expired.    Requested Prescriptions  Pending Prescriptions Disp Refills   atorvastatin  (LIPITOR) 10 MG tablet [Pharmacy Med Name: Atorvastatin  Calcium  Oral Tablet 10 MG] 90 tablet 3    Sig: TAKE 1 TABLET EVERY DAY     Cardiovascular:  Antilipid - Statins Failed - 07/30/2023 11:49 AM      Failed - Lipid Panel in normal range within the last 12 months    Cholesterol, Total  Date Value Ref Range Status  04/15/2021 154 100 - 199 mg/dL Final   LDL Cholesterol (Calc)  Date Value Ref Range Status  05/07/2020 65 mg/dL (calc) Final    Comment:    Reference range: <100 . Desirable range <100 mg/dL for primary prevention;   <70 mg/dL for patients with CHD or diabetic patients  with > or = 2 CHD risk factors. Aaron Aas LDL-C is now calculated using the Martin-Hopkins  calculation, which is a validated novel method providing  better accuracy than the Friedewald equation in the  estimation of LDL-C.  Melinda Sprawls et al. Erroll Heard. 1610;960(45): 2061-2068  (http://education.[employer].com/faq/FAQ164)    LDL Chol Calc (NIH)  Date Value Ref Range Status  04/15/2021 89 0 - 99 mg/dL Final   Direct LDL  Date Value Ref Range Status  04/04/2013 120.2 mg/dL Final    Comment:    Optimal:  <100 mg/dLNear or Above Optimal:  100-129 mg/dLBorderline High:  130-159 mg/dLHigh:  160-189 mg/dLVery High:  >190 mg/dL   HDL  Date Value Ref Range Status  04/15/2021 48 >39 mg/dL Final   Triglycerides  Date Value Ref Range Status  04/15/2021 89 0 - 149 mg/dL Final         Passed - Patient is not pregnant      Passed - Valid encounter within last 12 months    Recent Outpatient Visits           1 month ago Cellulitis of left upper extremity   [place] [facility] Amadeo June, MD   1 year ago Chronic pain of left knee   [place] [facility] Austine Lefort, MD   1 year ago Acute pain of right knee   [place] [facility] Austine Lefort, MD   1 year ago RUQ pain   [facility] Austine Lefort, MD   1 year ago URI, acute    [facility] Pickard, Cisco Crest, MD

## 2023-07-30 NOTE — Progress Notes (Signed)
 Pharmacy Antibiotic Note  Aaron Fox is a 68 y.o. male admitted on 07/27/2023 with osteomyelitis of L great toe.  Pharmacy has been consulted for vancomycin  dosing.  Plan: Vancomycin  1500mg  q24h for eAUC: 503, Vd: 0.5, Scr: 1.27.  Ceftriaxone  / flagyl  per MD, 7 day stop date placed when ordered.  Follow culture data for de-escalation.  Monitor renal function for dose adjustments as indicated.  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   Weight: 106.7 kg (235 lb 3.7 oz)  Temp (24hrs), Avg:98 F (36.7 C), Min:97.4 F (36.3 C), Max:98.5 F (36.9 C)  Recent Labs  Lab 07/27/23 0836 07/27/23 1008 07/28/23 0452 07/29/23 0402 07/30/23 0610  WBC 4.9  --  4.8 4.7 4.1  CREATININE 1.38*  --  1.17 1.15 1.27*  LATICACIDVEN  --  1.0  --   --   --     Estimated Creatinine Clearance: 70.7 mL/min (A) (by C-G formula based on SCr of 1.27 mg/dL (H)).    Allergies  Allergen Reactions   Penicillins Swelling and Other (See Comments)    Swelling of hands and feet Skin peeling   Lamisil [Terbinafine] Hives   Zestril [Lisinopril] Cough   Cipro  [Ciprofloxacin  Hcl] Hives and Rash    Antimicrobials this admission: Vancomycin  5/30 >>  Ceftriaxone  5/30 >>  Flagyl  5/30 >>   Microbiology results: 5/30 BCx: NGTD x2d   Thank you for allowing pharmacy to be a part of this patient's care.  Mamie Searles, PharmD, BCCCP  07/30/2023 7:34 AM

## 2023-07-30 NOTE — Plan of Care (Signed)

## 2023-07-30 NOTE — Transfer of Care (Signed)
 Immediate Anesthesia Transfer of Care Note  Patient: Aaron Fox  Procedure(s) Performed: AMPUTATION, TOE (Left: Toe)  Patient Location: PACU  Anesthesia Type:MAC  Level of Consciousness: awake, alert , oriented, drowsy, and patient cooperative  Airway & Oxygen Therapy: Patient Spontanous Breathing  Post-op Assessment: Report given to RN and Post -op Vital signs reviewed and stable  Post vital signs: Reviewed and stable  Last Vitals:  Vitals Value Taken Time  BP 98/60 07/30/23 0950  Temp    Pulse 72 07/30/23 0951  Resp 13 07/30/23 0951  SpO2 95 % 07/30/23 0951  Vitals shown include unfiled device data.  Last Pain:  Vitals:   07/30/23 0838  TempSrc:   PainSc: 0-No pain      Patients Stated Pain Goal: 0 (07/30/23 1610)  Complications: No notable events documented.

## 2023-07-30 NOTE — Anesthesia Postprocedure Evaluation (Signed)
 Anesthesia Post Note  Patient: Aaron Fox  Procedure(s) Performed: AMPUTATION, TOE (Left: Toe)     Patient location during evaluation: PACU Anesthesia Type: MAC Level of consciousness: awake and alert Pain management: pain level controlled Vital Signs Assessment: post-procedure vital signs reviewed and stable Respiratory status: spontaneous breathing, nonlabored ventilation, respiratory function stable and patient connected to nasal cannula oxygen Cardiovascular status: blood pressure returned to baseline and stable Postop Assessment: no apparent nausea or vomiting Anesthetic complications: no   No notable events documented.  Last Vitals:  Vitals:   07/30/23 1015 07/30/23 1036  BP: 106/64 114/61  Pulse: 63 62  Resp: 15 17  Temp: 36.6 C 36.4 C  SpO2: 91% 94%    Last Pain:  Vitals:   07/30/23 0950  TempSrc:   PainSc: 0-No pain                 Aaron Fox

## 2023-07-30 NOTE — Op Note (Signed)
 Full Operative Report  Date of Operation: 9:12 AM, 07/30/2023   Patient: Aaron Fox - 68 y.o. male  Surgeon: Evertt Hoe, DPM   Assistant: None  Diagnosis: acute osteomyelitis of left foot  Procedure:  1. Amputation of left great toe mid proximal phalanx level, left foot    Anesthesia: Anesthesia type not filed in the log.  No responsible provider has been recorded for the case.  CRNA: Grier Leber, CRNA   Estimated Blood Loss: Minimal   Hemostasis: 1) Anatomical dissection, mechanical compression, electrocautery 2) No tourniquet was used  Implants: * No implants in log *  Materials: prolene 3-0  Injectables: 1) Pre-operatively: 10 cc of 50:50 mixture 1%lidocaine  plain and 0.5% marcaine  plain 2) Post-operatively: None   Specimens: - Pathology: L hallux for pathology - Microbiology: Bone culture distal phalanx    Antibiotics: IV antibiotics given per schedule on the floor  Drains: None  Complications: Patient tolerated the procedure well without complication.   Operative findings: As below in detailed report  Indications for Procedure: [name] presents to [facility], [place] with a chief complaint of chronic wound and underlying acute osteomyelitis of left great toe. The patient has failed conservative treatments of various modalities. At this time the patient has elected to proceed with surgical correction. All alternatives, risks, and complications of the procedures were thoroughly explained to the patient. Patient exhibits appropriate understanding of all discussion points and informed consent was signed and obtained in the chart with no guarantees to surgical outcome given or implied.  Description of Procedure: Patient was brought to the operating room. Patient remained on their hospital bed in the supine position. A surgical timeout was performed and all members of the operating room, the procedure, and the surgical  site were identified. anesthesia occurred as per anesthesia record. Local anesthetic as previously described was then injected about the operative field in a local infiltrative block.  The operative lower extremity as noted above was then prepped and draped in the usual sterile manner. The following procedure then began.  Attention was directed to the FIRST digit on the LEFT foot. A full-thickness incision encompassing the entire digit was made using a #15 blade. Dissection was carried down to bone. The toe was secured with a towel clamp, further dissected in its entirety, and disarticulated at the IPJ and passed to the back table as a gross specimen. This was then labled and sent to pathology. The bone was noted to be soft and eroded, and consistent with osteomyelitis. A bone culture was harvested with rongeur from the distal phalanx. All remaining necrotic and devitalized soft tissue structures were visualized and dissected away using sharp and dull dissection. A sagittal saw then used to make a transverse osteotomy through the mid proximal phalanx for closure purposes, this head of proximal phalanx bone was passed and sent with pathology. Care was taken to protect all neurovascular structures throughout the dissection. All bleeders were cauterized as necessary.  The area was then flushed with copious amounts of sterile saline. Then using the suture materials previously described, the site was closed in anatomic layers and the skin was well approximated under minimal tension.  The surgical site was then dressed with xerform 4x4 kerlix and ace wrap. The patient tolerated both the procedure and anesthesia well with vital signs stable throughout. The patient was transferred in good condition and all vital signs stable  from the OR to recovery under the discretion of anesthesia.  Condition: Vital  signs stable, neurovascular status unchanged from preoperative   Surgical plan:  Expect clean margins. Decent  bleeding noted intra operative, should be enough for amp site healing but still want to eval ABI studies while here. Short course PO abx. Will be stable for DC from my standpoint by tmrw.   The patient will be WBAT in a post op shoe to the operative limb until further instructed. The dressing is to remain clean, dry, and intact. Will continue to follow unless noted elsewhere.   Russ Course, DPM [facility]

## 2023-07-30 NOTE — Progress Notes (Addendum)
 History and Physical Interval Note:  07/30/2023 9:10 AM  Aaron Fox  has presented today for surgery, with the diagnosis of osteomyelitis of great toe left foot.  The various methods of treatment have been discussed with the patient and family. After consideration of risks, benefits and other options for treatment, the patient has consented to   Procedure(s) with comments: AMPUTATION, TOE (Left) - LEFT GREAT TOE AMPUTAION as a surgical intervention.  The patient's history has been reviewed, patient examined, no change in status, stable for surgery.  I have reviewed the patient's chart and labs.  Questions were answered to the patient's satisfaction.     Karlene Overcast Jairus Tonne

## 2023-07-30 NOTE — Progress Notes (Signed)
 [facility] night cross cover note:   I added HS sliding scale novolog  for evening cbg of 311.     Camelia Cavalier, DO Hospitalist

## 2023-07-30 NOTE — Progress Notes (Signed)
 Orthopedic Tech Progress Note Patient Details:  Aaron Fox May 31, 1955 [id number]  Ortho Devices Type of Ortho Device: Postop shoe/boot Ortho Device/Splint Location: LLE Ortho Device/Splint Interventions: Ordered, Application, Adjustment   Post Interventions Patient Tolerated: Well Instructions Provided: Care of device  Kermitt Pedlar 07/30/2023, 12:01 PM

## 2023-07-31 ENCOUNTER — Encounter (HOSPITAL_COMMUNITY): Payer: Self-pay | Admitting: Podiatry

## 2023-07-31 ENCOUNTER — Other Ambulatory Visit (HOSPITAL_COMMUNITY): Payer: Self-pay

## 2023-07-31 DIAGNOSIS — M869 Osteomyelitis, unspecified: Secondary | ICD-10-CM | POA: Diagnosis not present

## 2023-07-31 LAB — BASIC METABOLIC PANEL WITH GFR
Anion gap: 8 (ref 5–15)
BUN: 41 mg/dL — ABNORMAL HIGH (ref 8–23)
CO2: 20 mmol/L — ABNORMAL LOW (ref 22–32)
Calcium: 9.4 mg/dL (ref 8.9–10.3)
Chloride: 108 mmol/L (ref 98–111)
Creatinine, Ser: 1.19 mg/dL (ref 0.61–1.24)
GFR, Estimated: >60 mL/min (ref >60)
Glucose, Bld: 172 mg/dL — ABNORMAL HIGH (ref 70–99)
Potassium: 4.4 mmol/L (ref 3.5–5.1)
Sodium: 136 mmol/L (ref 135–145)

## 2023-07-31 LAB — GLUCOSE, CAPILLARY
Glucose-Capillary: 194 mg/dL — ABNORMAL HIGH (ref 70–99)
Glucose-Capillary: 207 mg/dL — ABNORMAL HIGH (ref 70–99)

## 2023-07-31 LAB — CBC
HCT: 32.6 % — ABNORMAL LOW (ref 39.0–52.0)
Hemoglobin: 11 g/dL — ABNORMAL LOW (ref 13.0–17.0)
MCH: 28.3 pg (ref 26.0–34.0)
MCHC: 33.7 g/dL (ref 30.0–36.0)
MCV: 83.8 fL (ref 80.0–100.0)
Platelets: 270 10*3/uL (ref 150–400)
RBC: 3.89 MIL/uL — ABNORMAL LOW (ref 4.22–5.81)
RDW: 12.8 % (ref 11.5–15.5)
WBC: 6.6 10*3/uL (ref 4.0–10.5)
nRBC: 0 % (ref 0.0–0.2)

## 2023-07-31 MED ORDER — SULFAMETHOXAZOLE-TRIMETHOPRIM 800-160 MG PO TABS
2.0000 | ORAL_TABLET | Freq: Two times a day (BID) | ORAL | 0 refills | Status: AC
  Filled 2023-07-31: qty 16, 4d supply, fill #0

## 2023-07-31 NOTE — Progress Notes (Signed)
  Subjective:  Patient ID: Aaron Fox, male    DOB: 07/29/1955,  MRN: [id number]   DOS: 07/30/23 Procedure: 1. Amputation of left great toe mid proximal phalanx level, left foot   68 y.o. male seen for post op check. He reports doing well no pain this AM. Worked with PT and did very well. Eager to go home. Discussed findings from surgery and plans for follow up.  Review of Systems: Negative except as noted in the HPI. Denies N/V/F/Ch.   Objective:   Constitutional Well developed. Well nourished.  Vascular Foot warm and well perfused. Capillary refill normal to all digits.   No calf pain with palpation  Neurologic Normal speech. Oriented to person, place, and time. Epicritic sensation diminished to digit level  Dermatologic Dressing C/D/I to L foot  Orthopedic: S/p L hallux amputation through mid proximal phalanx   Radiographs: S/p amputation of L hallux mid proximal phalanx level  Pathology: Pending  Micro: Few GPC, Rare GNR, pending  Assessment:   1. 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  POD # 1 s/p L hallux amputation -Progressing well post op no issues - If we can get ABI/PVR study completed prior to DC that would be appreciated, otherwise follow up outpatient for this. Decent bleeding noted intraoperatively at amputation site.  -XR: Expected post op changes -WB Status: WBAT in post op shoe -Sutures: remain intact 2-3 weeks. -Medications/ABX: Recommend 5 days Augmentin on dc, expect surgical cure of infection from amputation -Dressing: Reamin clean dry intact until follow up next week - F/u Plan: Follow up in office next week Tuesday 6/10 for amp site check / dsg change, office will call to arrange. Pt stable for DC home as soon as later this afternoon from my perspective. Will sign off        Maridee Shoemaker,  DPM [facility] / [facility]

## 2023-07-31 NOTE — Evaluation (Signed)
 Occupational Therapy Evaluation Patient Details Name: Aaron Fox MRN: [id number] DOB: 18-Dec-1955 Today's Date: 07/31/2023   History of Present Illness   Pt is a 68 y/o male admitted for osteomyelitis of L great toe. Underwent partial amputation of L great toe on 6/2. 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 Pt presents now without pain but with minor deficits in dynamic standing balance. Overall, pt able to manage ADLs with Supervision-Modified Independence but benefits from CGA for hallway mobility w/o AD d/t intermittent swaying. Pt w/ prior R transmet amp, reports having diabetic shoes and inserts at home that may improve stability. Pt able to manage post op shoe w/o issues and denies concerns with AD mgmt. Pt functionally appropriate for DC home once medically cleared. Anticipate no OT needs at DC but will follow while admitted.      If plan is discharge home, recommend the following:   Other (comment) (PRN)     Functional Status Assessment   Patient has had a recent decline in their functional status and demonstrates the ability to make significant improvements in function in a reasonable and predictable amount of time.     Equipment Recommendations   None recommended by OT     Recommendations for Other Services         Precautions/Restrictions   Precautions Precautions: Fall Required Braces or Orthoses: Other Brace Other Brace: L post op shoe Restrictions Weight Bearing Restrictions Per Provider Order: Yes LLE Weight Bearing Per Provider Order: Weight bearing as tolerated Other Position/Activity Restrictions: WBAT in post op shoe     Mobility Bed Mobility               General bed mobility comments: in recliner on entry    Transfers Overall transfer level:  Independent Equipment used: None                      Balance Overall balance assessment: Mild deficits observed, not formally tested                                         ADL either performed or assessed with clinical judgement   ADL Overall ADL's : Needs assistance/impaired Eating/Feeding: Independent   Grooming: Modified independent;Standing   Upper Body Bathing: Modified independent   Lower Body Bathing: Supervison/ safety   Upper Body Dressing : Modified independent   Lower Body Dressing: Supervision/safety Lower Body Dressing Details (indicate cue type and reason): able to don post op shoe with increased time Toilet Transfer: Contact guard assist;Ambulation   Toileting- Clothing Manipulation and Hygiene: Modified independent;Sit to/from stand;Sitting/lateral lean       Functional mobility during ADLs: Contact guard assist General ADL Comments: Hx of R transmet amp- has diabetic shoes and inserts at home but only croc shoes at bedside. Some unsteadiness with hallway mobility without AD but pt correcting and feels gait would be better if he had his other shoes. Discussed covering L foot with bags for showering if cleared by doctor and post op shoe wear to protect surgical site     Vision Ability to See in Adequate Light: 0 Adequate Patient Visual Report: No change from baseline Vision Assessment?: No apparent visual deficits  Perception         Praxis         Pertinent Vitals/Pain Pain Assessment Pain Assessment: No/denies pain     Extremity/Trunk Assessment Upper Extremity Assessment Upper Extremity Assessment: Overall WFL for tasks assessed;Right hand dominant   Lower Extremity Assessment Lower Extremity Assessment: Defer to PT evaluation   Cervical / Trunk Assessment Cervical / Trunk Assessment: Normal   Communication Communication Communication: No apparent difficulties   Cognition Arousal: Alert Behavior During  Therapy: WFL for tasks assessed/performed Cognition: No apparent impairments                               Following commands: Intact       Cueing  General Comments   Cueing Techniques: Verbal cues      Exercises     Shoulder Instructions      Home Living Family/patient expects to be discharged to:: Private residence Living Arrangements: Spouse/significant other Available Help at Discharge: Family Type of Home: House Home Access: Stairs to enter Secretary/administrator of Steps: 2 Entrance Stairs-Rails: Right Home Layout: One level     Bathroom Shower/Tub: Producer, television/film/video: Standard                Prior Functioning/Environment Prior Level of Function : Independent/Modified Independent;Driving;Working/employed             Mobility Comments: no AD, active at baseline ADLs Comments: Indep with ADLs, IADLs. 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    OT Problem List: Impaired balance (sitting and/or standing)   OT Treatment/Interventions: Self-care/ADL training;Therapeutic exercise;Energy conservation;DME and/or AE instruction;Therapeutic activities;Patient/family education;Balance training      OT Goals(Current goals can be found in the care plan section)   Acute Rehab OT Goals Patient Stated Goal: go home today OT Goal Formulation: With patient Time For Goal Achievement: 08/14/23 Potential to Achieve Goals: Good ADL Goals Additional ADL Goal #1: Pt to gather ADL/IADL items Independently without LOB or safety concerns   OT Frequency:  Min 1X/week    Co-evaluation              AM-PAC OT "6 Clicks" Daily Activity     Outcome Measure Help from another person eating meals?: None Help from another person taking care of personal grooming?: None Help from another person toileting, which includes using toliet, bedpan, or urinal?: A Little Help from another person bathing (including washing, rinsing, drying)?: A  Little Help from another person to put on and taking off regular upper body clothing?: None Help from another person to put on and taking off regular lower body clothing?: A Little 6 Click Score: 21   End of Session Equipment Utilized During Treatment: Gait belt Nurse Communication: Mobility status  Activity Tolerance: Patient tolerated treatment well Patient left: in chair;with call bell/phone within reach  OT Visit Diagnosis: Unsteadiness on feet (R26.81)                Time: 1610-9604 OT Time Calculation (min): 17 min Charges:  OT General Charges $OT Visit: 1 Visit OT Evaluation $OT Eval Low Complexity: 1 Low  Lawrence Pretty, OTR/L Acute Rehab Services Office: [phone]   Shireen Dory 07/31/2023, 10:08 AM

## 2023-07-31 NOTE — TOC Transition Note (Signed)
 Transition of Care [facility]) - Discharge Note   Patient Details  Name: Aaron Fox MRN: [id number] Date of Birth: 04-Sep-1955  Transition of Care [facility]) CM/SW Contact:  Jeani Mill, RN Phone Number: 07/31/2023, 12:46 PM   Clinical Narrative:    [name] is stable to discharge home.  Pateient received walker in 2021. Patient will buy his own cane.  No other TOC needs at this time.   Final next level of care: Home/Self Care Barriers to Discharge: Barriers Resolved   Patient Goals and CMS Choice Patient states their goals for this hospitalization and ongoing recovery are:: return home          Discharge Placement             home          Discharge Plan and Services Additional resources added to the After Visit Summary for                                       Social Drivers of Health (SDOH) Interventions SDOH Screenings   Food Insecurity: No Food Insecurity (07/27/2023)  Housing: Low Risk  (07/27/2023)  Transportation Needs: No Transportation Needs (07/27/2023)  Utilities: Not At Risk (07/27/2023)  Alcohol Screen: Low Risk  (02/15/2023)  Depression (PHQ2-9): Low Risk  (06/22/2023)  Financial Resource Strain: Low Risk  (02/15/2023)  Physical Activity: Sufficiently Active (02/15/2023)  Social Connections: Socially Integrated (07/27/2023)  Stress: No Stress Concern Present (02/15/2023)  Tobacco Use: Medium Risk (07/30/2023)  Health Literacy: Adequate Health Literacy (02/15/2023)     Readmission Risk Interventions    07/31/2023   12:46 PM  Readmission Risk Prevention Plan  Post Dischage Appt Complete  Medication Screening Complete  Transportation Screening Complete

## 2023-07-31 NOTE — Evaluation (Signed)
 Physical Therapy Evaluation and Discharge Patient Details Name: Aaron Fox MRN: [id number] DOB: 25-Sep-1955 Today's Date: 07/31/2023  History of Present Illness  Pt is a 68 y/o male admitted for osteomyelitis of L great toe. Underwent partial amputation of L great toe on 6/2. PMH: prior R transmet amputation, DM, HTN, cellulitis of L UE, HLD, RA, gout  Clinical Impression  Patient evaluated by Physical Therapy with no further acute PT needs identified. All education has been completed and the patient has no further questions. At the time of PT eval pt was able to perform transfers and ambulation with modified independence. Pt able to ambulate without an AD however steadier and more comfortable with the SPC. Pt in post-op shoe throughout session and was educated that post-op shoe needs to be donned for all OOB activity. Pt completed stair training at this time. See below for any follow-up Physical Therapy or equipment needs. PT is signing off. Thank you for this referral.         If plan is discharge home, recommend the following: Assist for transportation   Can travel by private vehicle        Equipment Recommendations Cane  Recommendations for Other Services       Functional Status Assessment Patient has had a recent decline in their functional status and demonstrates the ability to make significant improvements in function in a reasonable and predictable amount of time.     Precautions / Restrictions Precautions Precautions: Fall Recall of Precautions/Restrictions: Intact Required Braces or Orthoses: Other Brace Other Brace: L post op shoe Restrictions Weight Bearing Restrictions Per Provider Order: Yes LLE Weight Bearing Per Provider Order: Weight bearing as tolerated Other Position/Activity Restrictions: WBAT in post op shoe      Mobility  Bed Mobility               General bed mobility comments: in recliner on entry    Transfers Overall transfer level:  Independent Equipment used: None                    Ambulation/Gait Ambulation/Gait assistance: Modified independent (Device/Increase time) Gait Distance (Feet): 250 Feet Assistive device: Straight cane, None Gait Pattern/deviations: Step-through pattern, Decreased stride length Gait velocity: Decreased Gait velocity interpretation: <1.31 ft/sec, indicative of household ambulator   General Gait Details: Pt ambulating around room without AD. In hall, utilize [facility] and with good sequencing. No unsteadiness or LOB noted.  Stairs Stairs: Yes Stairs assistance: Modified independent (Device/Increase time) Stair Management: Step to pattern, Forwards, Two rails Number of Stairs: 5 General stair comments: VC's for sequencing and general safety. No unsteadiness or LOB noted.  Wheelchair Mobility     Tilt Bed    Modified Rankin (Stroke Patients Only)       Balance Overall balance assessment: Mild deficits observed, not formally tested                                           Pertinent Vitals/Pain Pain Assessment Pain Assessment: Faces Faces Pain Scale: Hurts a little bit Pain Location: L big toe Pain Descriptors / Indicators: Operative site guarding, Sore Pain Intervention(s): Monitored during session, Limited activity within patient's tolerance, Repositioned    Home Living Family/patient expects to be discharged to:: Private residence Living Arrangements: Spouse/significant other Available Help at Discharge: Family Type of Home: House Home Access: Stairs to enter Entrance Stairs-Rails:  Right Entrance Stairs-Number of Steps: 2   Home Layout: One level Home Equipment: None      Prior Function Prior Level of Function : Independent/Modified Independent;Driving;Working/employed             Mobility Comments: no AD, active at baseline ADLs Comments: Indep with ADLs, IADLs. 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 Pt reports mild pain when weight bearing.    Cervical / Trunk Assessment Cervical / Trunk Assessment: Normal  Communication   Communication Communication: No apparent difficulties    Cognition Arousal: Alert Behavior During Therapy: WFL for tasks assessed/performed   PT - Cognitive impairments: No apparent impairments                         Following commands: Intact       Cueing Cueing Techniques: Verbal cues     General Comments      Exercises     Assessment/Plan    PT Assessment Patient does not need any further PT services  PT Problem List         PT Treatment Interventions      PT Goals (Current goals can be found in the Care Plan section)  Acute Rehab PT Goals Patient Stated Goal: Home today PT Goal Formulation: All assessment and education complete, DC therapy    Frequency       Co-evaluation               AM-PAC PT "6 Clicks" Mobility  Outcome Measure Help needed turning from your back to your side while in a flat bed without using bedrails?: None Help needed moving from lying on your back to sitting on the side of a flat bed without using bedrails?: None Help needed moving to and from a bed to a chair (including a wheelchair)?: None Help needed standing up from a chair using your arms (e.g., wheelchair or bedside chair)?: None Help needed to walk in hospital room?: None Help needed climbing 3-5 steps with a railing? : None 6 Click Score: 24    End of Session Equipment Utilized During Treatment: Gait belt Activity Tolerance: Patient tolerated treatment well Patient left: in chair;with call bell/phone within reach Nurse Communication: Mobility status PT Visit Diagnosis: Pain;Difficulty in  walking, not elsewhere classified (R26.2) Pain - Right/Left: Left Pain - part of body: Ankle and joints of foot    Time: 1610-9604 PT Time Calculation (min) (ACUTE ONLY): 19 min   Charges:   PT Evaluation $PT Eval Low Complexity: 1 Low   PT General Charges $$ ACUTE PT VISIT: 1 Visit         Simone Dubois, PT, DPT Acute Rehabilitation Services Secure Chat Preferred Office: [phone]   Venus Ginsberg 07/31/2023, 11:50 AM

## 2023-07-31 NOTE — Discharge Summary (Signed)
 [name] ZOX:[id number] DOB: 1955-07-05 DOA: 07/27/2023  PCP: Austine Lefort, MD  Admit date: 07/27/2023 Discharge date: 07/31/2023  Time spent: 35 minutes  Recommendations for Outpatient Follow-up:  Podiatry f/u 1 week  F/u bone path/culture Outpatient ABIs (ordered inpatient but not performed, thus ordered as outpatient)    Discharge Diagnoses:  Principal Problem:   Osteomyelitis of great toe of left foot Aaron Fox)   Discharge Condition: stable  Diet recommendation: heart healthy  Filed Weights   07/27/23 1000 07/30/23 0829  Weight: 106.7 kg (P) 102.1 kg    History of present illness:  From admission h and p Aaron Fox is a 68 y.o. male with medical history significant for DM, HTN, Cellulitis of left upper extremity, onychomycosis, hyperlipidemia, Morbid obesity. The patient states that 2 weeks ago he began wearing a new pair of steel toed shoes to work. He soon found them uncomfortable, but continued to wear them. He went to his podiatrist today as the toe looked strange and had a foul odor. The patient states that the toe itself never really felt painful. He states that it is not painful now. On imagery the toe was found to have changes in the toe consistent with osteomyelitis. He was directed to come to the ED for evaluation and treatment of osteomyelitis of the left great toe.   He denies fevers, chills, nausea, vomiting, hematemesis, melena, or hematochezia. No neurological changes. No headaches. No rhinorhea, sore throat, or cough. No chest pain, shortness of breath or diaphoresis. No diarrhea of constipation. No abdominal pain.     Hospital Course:  Patient presents with osteomyelitis of left great toe. Podiatry performed partial amputation on 6/2. Cleared for discharge and only PT/OT recs are use of cane at home which patient has. Bone culture growing GPCs and GNRs, given penicillin allergy will not prescribe augmentin as advised by podiatry instead will rx  bactrim . F/u podiatry 1 week. Will need outpatient ABIs (ordered here but not performed presumably 2/2 staffing problems).   Procedures: Left great toe partial amputation   Consultations: podiatry  Discharge Exam: Vitals:   07/31/23 0503 07/31/23 0741  BP: (!) 142/71 (!) 117/55  Pulse: 75 64  Resp: 18   Temp: 98.7 F (37.1 C) 98 F (36.7 C)  SpO2: 97% 95%    General: NAD Cardiovascular: RRR Respiratory: CTAB Ext: bandaged left foot  Discharge Instructions   Discharge Instructions     Diet - low sodium heart healthy   Complete by: As directed    Increase activity slowly   Complete by: As directed       Allergies as of 07/31/2023       Reactions   Penicillins Swelling, Other (See Comments)   Swelling of hands and feet Skin peeling   Lamisil [terbinafine] Hives   Zestril [lisinopril] Cough   Cipro  [ciprofloxacin  Hcl] Hives, Rash        Medication List     STOP taking these medications    doxycycline  100 MG tablet Commonly known as: VIBRA -TABS       TAKE these medications    Accu-Chek Guide Test test strip Generic drug: glucose blood Use as instructed to monitor glucose 4 times daily   Accu-Chek Softclix Lancets lancets TEST BLOOD SUGAR 7 TIMES DAILY   aspirin  81 MG tablet Take 81 mg by mouth daily.   atorvastatin  10 MG tablet Commonly known as: LIPITOR TAKE 1 TABLET EVERY DAY   gabapentin  300 MG capsule Commonly known as: NEURONTIN  TAKE  2 CAPSULES THREE TIMES DAILY   INSULIN  SYRINGE 1CC/29G 29G X 1/2" 1 ML Misc Commonly known as: Droplet Insulin  Syringe Use to inject in morning and bedtime   losartan  50 MG tablet Commonly known as: COZAAR  TAKE 1 TABLET EVERY DAY   Multivitamin Men 50+ Tabs Take 1 tablet by mouth daily.   NovoLIN  70/30 Kwikpen (70-30) 100 UNIT/ML KwikPen Generic drug: insulin  isophane & regular human KwikPen Inject 10 Units into the skin 2 (two) times daily before a meal.   sulfamethoxazole -trimethoprim  800-160  MG tablet Commonly known as: BACTRIM  DS Take 2 tablets by mouth every 12 (twelve) hours for 4 days.   triamcinolone  cream 0.1 % Commonly known as: KENALOG  APPLY TO THE AFFECTED AREA(S) TOPICALLY THREE TIMES DAILY AS NEEDED FOR RASH What changed: See the new instructions.   VISINE OP Place 1 drop into both eyes daily as needed (redness).               Durable Medical Equipment  (From admission, onward)           Start     Ordered   07/31/23 1146  For home use only DME Cane  Once        07/31/23 1145           Allergies  Allergen Reactions   Penicillins Swelling and Other (See Comments)    Swelling of hands and feet Skin peeling   Lamisil [Terbinafine] Hives   Zestril [Lisinopril] Cough   Cipro  [Ciprofloxacin  Hcl] Hives and Rash    Follow-up Information     Austine Lefort, MD Follow up in 2 day(s).   Specialty: Family Medicine Why: Hospital follow up Contact information: [address] [place] [address] [phone]         Evertt Hoe, DPM Follow up.   Specialty: Podiatry Why: in one week Contact information: [address] [place] [address] [phone]                  The results of significant diagnostics from this hospitalization (including imaging, microbiology, ancillary and laboratory) are listed below for reference.    Significant Diagnostic Studies: DG Foot 2 Views Left Result Date: 07/30/2023 CLINICAL DATA:  Postop. EXAM: LEFT FOOT - 2 VIEW COMPARISON:  Preoperative imaging FINDINGS: Resection of the great toe at the mid aspect of the proximal phalanx. The resection margin is smooth. Expected postoperative changes in the soft tissues. The exam is otherwise unchanged. Unchanged hammertoe deformity of the digits. Midfoot degenerative spurring. IMPRESSION: Postoperative change of the great toe. Electronically Signed   By: Chadwick Colonel M.D.   On: 07/30/2023 13:56   MR TOES  LEFT WO CONTRAST Result Date: 07/27/2023 CLINICAL DATA:  Osteomyelitis, foot Eval OM L hallux EXAM: MRI OF THE LEFT TOES WITHOUT CONTRAST TECHNIQUE: Multiplanar, multisequence MR imaging of the left toes was performed. No intravenous contrast was administered. COMPARISON:  Left foot radiograph dated 07/27/2023 at 7:34 a.m. FINDINGS: Bones/Joint/Cartilage Soft tissue ulceration at the tip of the first distal phalanx which tracks deep 6 mm to the plantar cortex of the first distal phalangeal tuft. There is associated marrow edema and confluence T1 hypointense signal of the first distal phalanx, most compatible with osteomyelitis. Trace fluid in the interphalangeal joint of the great toe, septic arthritis can not be excluded. No convincing marrow signal abnormality identified elsewhere to suggest osteomyelitis. Moderate first MTP joint space narrowing and marginal osteophytosis. Mild degenerative changes of  second through fifth MTP joints. Degenerative changes of the first metatarsal-hallux sesamoid articulation. First through fifth digit hammertoes with diffuse interphalangeal joint space narrowing. Ligaments Collateral ligaments are intact. Muscles and Tendons Flexor and extensor compartment tendons are intact. No tenosynovitis. Mild atrophy of the intrinsic musculature of the foot, may reflect chronic denervation changes. Soft tissue Soft tissue ulceration at the tip of the first distal phalanx with cutaneous thickening and mild edema. No loculated fluid collection. IMPRESSION: 1. Findings compatible with osteomyelitis of the distal phalanx of the great toe. Trace fluid in the interphalangeal joint of the great toe, septic arthritis can not be excluded. 2. Soft tissue ulceration at the tip of the first distal phalanx. No fluid collection. 3. Degenerative changes of the forefoot, as above. Electronically Signed   By: Mannie Seek M.D.   On: 07/27/2023 14:12   DG Foot Complete Left Result Date:  07/27/2023 Please see detailed radiograph report in office note.   Microbiology: Recent Results (from the past 240 hours)  Blood culture (routine x 2)     Status: None (Preliminary result)   Collection Time: 07/27/23  9:27 AM   Specimen: BLOOD  Result Value Ref Range Status   Specimen Description BLOOD SITE NOT SPECIFIED  Final   Special Requests   Final    BOTTLES DRAWN AEROBIC AND ANAEROBIC Blood Culture adequate volume   Culture   Final    NO GROWTH 4 DAYS Performed at [facility], [address], [place], [place] [address]    Report Status PENDING  Incomplete  Blood culture (routine x 2)     Status: None (Preliminary result)   Collection Time: 07/27/23 12:26 PM   Specimen: BLOOD  Result Value Ref Range Status   Specimen Description BLOOD RIGHT ANTECUBITAL  Final   Special Requests Blood Culture adequate volume  Final   Culture   Final    NO GROWTH 4 DAYS Performed at [facility], [address] [place], [place] [address]    Report Status PENDING  Incomplete  Culture, blood (single) w Reflex to ID Panel     Status: None (Preliminary result)   Collection Time: 07/27/23 12:26 PM   Specimen: BLOOD RIGHT HAND  Result Value Ref Range Status   Specimen Description BLOOD RIGHT HAND  Final   Special Requests BLOOD Blood Culture adequate volume  Final   Culture   Final    NO GROWTH 4 DAYS Performed at [facility], [address] [place], [place] [address]    Report Status PENDING  Incomplete  Aerobic/Anaerobic Culture w Gram Stain (surgical/deep wound)     Status: None (Preliminary result)   Collection Time: 07/30/23  9:34 AM   Specimen: Toe, Left; Amputation  Result Value Ref Range Status   Specimen Description BONE  Final   Special Requests LEFT TOE  Final   Gram Stain   Final    NO WBC SEEN FEW GRAM POSITIVE COCCI IN PAIRS RARE GRAM NEGATIVE RODS    Culture   Final    FEW GRAM NEGATIVE RODS SUSCEPTIBILITIES TO FOLLOW Performed at [facility], [address], [place], [place] [address]    Report Status PENDING  Incomplete     Labs: Basic Metabolic Panel: Recent Labs  Lab 07/27/23 0836 07/28/23 0452 07/29/23 0402 07/30/23 0610 07/31/23 0539  NA 135 137 137 135 136  K 4.2 4.9 4.5 4.3 4.4  CL 106 109 107 107 108  CO2  20* 23 21* 20* 20*  GLUCOSE 214* 149* 132* 125* 172*  BUN 27* 28* 27* 32* 41*  CREATININE 1.38* 1.17 1.15 1.27* 1.19  CALCIUM  9.2 8.9 9.0 8.8* 9.4   Liver Function Tests: Recent Labs  Lab 07/28/23 0452  AST 21  ALT 19  ALKPHOS 57  BILITOT 0.6  PROT 6.8  ALBUMIN 2.8*   No results for input(s): "LIPASE", "AMYLASE" in the last 168 hours. No results for input(s): "AMMONIA" in the last 168 hours. CBC: Recent Labs  Lab 07/27/23 0836 07/28/23 0452 07/29/23 0402 07/30/23 0610 07/31/23 0539  WBC 4.9 4.8 4.7 4.1 6.6  NEUTROABS 2.5 2.3 2.5 2.0  --   HGB 10.9* 10.7* 10.8* 10.6* 11.0*  HCT 32.6* 31.6* 32.1* 31.3* 32.6*  MCV 84.0 84.5 84.3 84.8 83.8  PLT 242 244 237 249 270   Cardiac Enzymes: No results for input(s): "CKTOTAL", "CKMB", "CKMBINDEX", "TROPONINI" in the last 168 hours. BNP: BNP (last 3 results) No results for input(s): "BNP" in the last 8760 hours.  ProBNP (last 3 results) No results for input(s): "PROBNP" in the last 8760 hours.  CBG: Recent Labs  Lab 07/30/23 1306 07/30/23 1759 07/30/23 2006 07/31/23 0740 07/31/23 1212  GLUCAP 162* 308* 311* 194* 207*       Signed:  Raymonde Calico MD.  [facility] 07/31/2023, 12:47 PM

## 2023-07-31 NOTE — Plan of Care (Signed)

## 2023-08-01 ENCOUNTER — Telehealth: Payer: Self-pay | Admitting: *Deleted

## 2023-08-01 LAB — CULTURE, BLOOD (SINGLE)
Culture: NO GROWTH
Special Requests: ADEQUATE

## 2023-08-01 LAB — CULTURE, BLOOD (ROUTINE X 2)
Culture: NO GROWTH
Culture: NO GROWTH
Special Requests: ADEQUATE
Special Requests: ADEQUATE

## 2023-08-01 LAB — SURGICAL PATHOLOGY

## 2023-08-01 NOTE — Transitions of Care (Post Inpatient/ED Visit) (Signed)
 08/01/2023  Name: Aaron Fox MRN: [id number] DOB: [birth year]  Today's TOC FU Call Status: Today's TOC FU Call Status:: Successful TOC FU Call Completed TOC FU Call Complete Date: 08/01/23 Patient's Name and Date of Birth confirmed.  Transition Care Management Follow-up Telephone Call Date of Discharge: 07/31/23 Discharge Facility: [facility]) Type of Discharge: Inpatient Admission Primary Inpatient Discharge Diagnosis:: Osteomyelitis of great toe of left foot How have you been since you were released from the hospital?: Better Any questions or concerns?: No  Items Reviewed: Did you receive and understand the discharge instructions provided?: Yes Medications obtained,verified, and reconciled?: Yes (Medications Reviewed) Any new allergies since your discharge?: No Dietary orders reviewed?: Yes Type of Diet Ordered:: heart healthy Do you have support at home?: Yes People in Home [RPT]: spouse Name of Support/Comfort Primary Source: Claretta Croft Reviewed signs/ symptoms of infection, reportable signs /symptoms Reviewed safety precautions  Medications Reviewed Today: Medications Reviewed Today     Reviewed by Daralyn Earl, RN (Registered Nurse) on 08/01/23 at 1103  Med List Status: <None>   Medication Order Taking? Sig Documenting Provider Last Dose Status Informant  Accu-Chek Softclix Lancets lancets [id number] Yes TEST BLOOD SUGAR 7 TIMES DAILY Emilie Harden, MD Taking Active Self, Pharmacy Records  aspirin  81 MG tablet [id number] Yes Take 81 mg by mouth daily. [provider] Taking Active Self, Pharmacy Records  atorvastatin  (LIPITOR) 10 MG tablet [id number] Yes TAKE 1 TABLET EVERY DAY Austine Lefort, MD Taking Active   gabapentin  (NEURONTIN ) 300 MG capsule [id number] Yes TAKE 2 CAPSULES THREE TIMES DAILY Austine Lefort, MD Taking Active Self, Pharmacy Records  glucose blood (ACCU-CHEK GUIDE TEST) test strip [id number] Yes Use as instructed to  monitor glucose 4 times daily Wendel Hals, NP Taking Active Self, Pharmacy Records  insulin  isophane & regular human KwikPen (NOVOLIN  70/30 KWIKPEN) (70-30) 100 UNIT/ML KwikPen [id number] Yes Inject 10 Units into the skin 2 (two) times daily before a meal. Wendel Hals, NP Taking Active Self, Pharmacy Records  INSULIN  SYRINGE 1CC/29G (DROPLET INSULIN  SYRINGE) 29G X 1/2" 1 ML MISC [id number] Yes Use to inject in morning and bedtime Shamleffer, Ibtehal Jaralla, MD Taking Active Self, Pharmacy Records  losartan  (COZAAR ) 50 MG tablet [id number] Yes TAKE 1 TABLET EVERY DAY Austine Lefort, MD Taking Active Self, Pharmacy Records  Multiple Vitamins-Minerals (MULTIVITAMIN MEN 50+) TABS [id number] Yes Take 1 tablet by mouth daily. [provider] Taking Active Self, Pharmacy Records  sulfamethoxazole -trimethoprim  (BACTRIM  DS) 800-160 MG tablet [id number] Yes Take 2 tablets by mouth every 12 (twelve) hours for 4 days. Wouk, Haynes Lips, MD Taking Active   Tetrahydrozoline HCl [place] [facility]) [id number] Yes Place 1 drop into both eyes daily as needed (redness). [provider] Taking Active Self, Pharmacy Records  triamcinolone  cream (KENALOG ) 0.1 % [id number] Yes APPLY TO THE AFFECTED AREA(S) TOPICALLY THREE TIMES DAILY AS NEEDED FOR RASH  Patient taking differently: Apply 1 Application topically 2 (two) times daily as needed (skin irritation).   Jenelle Mis, FNP Taking Active Self, Pharmacy Records            Home Care and Equipment/Supplies: Were Home Health Services Ordered?: No Any new equipment or medical supplies ordered?: No  Functional Questionnaire: Do you need assistance with bathing/showering or dressing?: No (pt states he is able to get in shower but is currently not getting in shower due to dressing intact to left foot) Do you need assistance with meal preparation?: No Do you need  assistance with eating?: No Do you have difficulty maintaining continence: No Do  you need assistance with getting out of bed/getting out of a chair/moving?: No Do you have difficulty managing or taking your medications?: No  Follow up appointments reviewed: PCP Follow-up appointment confirmed?: Yes (collaborated with care guide and scheduled appointment) Date of PCP follow-up appointment?: 08/02/23 Follow-up Provider: Yolanda Hence FNP  @ 1115 am Specialist Hospital Follow-up appointment confirmed?: Yes Date of Specialist follow-up appointment?: 08/07/23 Follow-Up Specialty Provider:: podiatry Alexander Standiford  @ 115 pm Do you need transportation to your follow-up appointment?: No Do you understand care options if your condition(s) worsen?: Yes-patient verbalized understanding  SDOH Interventions Today    Flowsheet Row Most Recent Value  SDOH Interventions   Food Insecurity Interventions Intervention Not Indicated  Housing Interventions Intervention Not Indicated  Transportation Interventions Intervention Not Indicated  Utilities Interventions Intervention Not Indicated       Cecilie Coffee [facility], BSN RN Care Manager/ Transition of Care [place]/ [facility] [phone]

## 2023-08-02 ENCOUNTER — Ambulatory Visit: Admitting: Family Medicine

## 2023-08-02 ENCOUNTER — Encounter: Payer: Self-pay | Admitting: Family Medicine

## 2023-08-02 VITALS — BP 106/62 | HR 64 | Temp 98.4°F | Ht 71.5 in | Wt 228.6 lb

## 2023-08-02 DIAGNOSIS — Z89412 Acquired absence of left great toe: Secondary | ICD-10-CM

## 2023-08-02 DIAGNOSIS — M869 Osteomyelitis, unspecified: Secondary | ICD-10-CM

## 2023-08-02 NOTE — Progress Notes (Signed)
 Subjective:  HPI: Aaron Fox is a 68 y.o. male presenting on 08/02/2023 for Hospitalization Follow-up (07/27/23 Discharge from ED For Osteomyelitis of great toe of left foot [facility])   HPI Patient is in today for hospital follow up. Aaron Fox was hospitalized from 07/27/2023-07/31/2023 for osteomyelitis of left great toe requiring partial amputation. Was DC with use of cane at home, rx for Bactrim , and podiatry follow up 1 week. Needs ABIs completed. Wound culture pending. Podiatry appt scheduled 08/07/2023. Today Aaron Fox reports he is doing great. Denies any pain. Post-op dressing clean, dry, intact. Is still taking antibiotics. Ambulating without problems or assistive device. Denies pain, redness, swelling, warmth of RLE, fever, chills, body aches.  Hospital Discharge Summary 07/31/2023 for reference only: History of present illness:  From admission h and p Aaron Fox is a 68 y.o. male with medical history significant for DM, HTN, Cellulitis of left upper extremity, onychomycosis, hyperlipidemia, Morbid obesity. The patient states that 2 weeks ago he began wearing a new pair of steel toed shoes to work. He soon found them uncomfortable, but continued to wear them. He went to his podiatrist today as the toe looked strange and had a foul odor. The patient states that the toe itself never really felt painful. He states that it is not painful now. On imagery the toe was found to have changes in the toe consistent with osteomyelitis. He was directed to come to the ED for evaluation and treatment of osteomyelitis of the left great toe.   He denies fevers, chills, nausea, vomiting, hematemesis, melena, or hematochezia. No neurological changes. No headaches. No rhinorhea, sore throat, or cough. No chest pain, shortness of breath or diaphoresis. No diarrhea of constipation. No abdominal pain.      Hospital Course:  Patient presents with osteomyelitis of left great toe. Podiatry performed  partial amputation on 6/2. Cleared for discharge and only PT/OT recs are use of cane at home which patient has. Bone culture growing GPCs and GNRs, given penicillin allergy will not prescribe augmentin as advised by podiatry instead will rx bactrim . F/u podiatry 1 week. Will need outpatient ABIs (ordered here but not performed presumably 2/2 staffing problems).    Procedures: Left great toe partial amputation    Consultations: podiatry  Review of Systems  All other systems reviewed and are negative.   Relevant past medical history reviewed and updated as indicated.   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   Lymphadenopathy    Peripheral neuropathy    PUD (peptic ulcer disease)    Rheumatoid arthritis(714.0) 09/22/2006     Past Surgical History:  Procedure Laterality Date   ACHILLES TENDON SURGERY Right 04/25/2019   Procedure: ACHILLES LENGTHENING;  Surgeon: Charity Conch, DPM;  Location: [facility];  Service: Podiatry;  Laterality: Right;   AMPUTATION Right 04/25/2019   Procedure: AMPUTATION  FOOT,TRANSMETATARSAL;  Surgeon: Charity Conch, DPM;  Location: [facility];  Service: Podiatry;  Laterality: Right;   AMPUTATION TOE Left 07/30/2023   Procedure: AMPUTATION, TOE;  Surgeon: Evertt Hoe, DPM;  Location: [facility];  Service: Orthopedics/Podiatry;  Laterality: Left;  LEFT GREAT TOE AMPUTAION   CATARACT EXTRACTION Bilateral    Dr. Lenna Quinton    CATARACT EXTRACTION, BILATERAL     COLONOSCOPY  2004   LYMPH NODE BIOPSY Right 10/16/2014   Procedure: RIGHT INGUINAL LYMPH NODE BIOPSY;  Surgeon: Alanda Allegra, MD;  Location: [facility];  Service: General;  Laterality: Right;    Allergies and medications reviewed and updated.   Current Outpatient Medications:    Accu-Chek Softclix Lancets lancets, TEST BLOOD SUGAR 7 TIMES DAILY, Disp: 600 each, Rfl: 3   aspirin  81 MG tablet, Take 81 mg by mouth daily., Disp: , Rfl:    atorvastatin  (LIPITOR) 10 MG tablet, TAKE 1 TABLET EVERY DAY, Disp: 90 tablet, Rfl: 3   gabapentin  (NEURONTIN ) 300 MG capsule, TAKE 2 CAPSULES THREE TIMES DAILY, Disp: 540 capsule, Rfl: 2   glucose blood (ACCU-CHEK GUIDE TEST) test strip, Use as instructed to monitor glucose 4 times daily, Disp: 300 each, Rfl: 6   insulin  isophane & regular human KwikPen (NOVOLIN  70/30 KWIKPEN) (70-30) 100 UNIT/ML KwikPen, Inject 10 Units into the skin 2 (two) times daily before a meal., Disp: 15 mL, Rfl: 1   INSULIN  SYRINGE 1CC/29G (DROPLET INSULIN  SYRINGE) 29G X 1/2" 1 ML MISC, Use to inject in morning and bedtime, Disp: 200 each, Rfl: 1   losartan  (COZAAR ) 50 MG tablet, TAKE 1 TABLET EVERY DAY, Disp: 90 tablet, Rfl: 10   Multiple Vitamins-Minerals (MULTIVITAMIN MEN 50+) TABS, Take 1 tablet by mouth daily., Disp: , Rfl:    sulfamethoxazole -trimethoprim  (BACTRIM  DS) 800-160 MG tablet, Take 2 tablets by mouth every 12 (twelve) hours for 4 days., Disp: 16 tablet, Rfl: 0   Tetrahydrozoline HCl (VISINE OP), Place 1 drop into both eyes daily as needed (redness)., Disp: , Rfl:    triamcinolone  cream (KENALOG ) 0.1 %, APPLY TO THE AFFECTED AREA(S) TOPICALLY THREE TIMES DAILY AS NEEDED FOR RASH (Patient taking differently: Apply 1 Application topically 2 (two) times daily as needed (skin irritation).), Disp: 45 g, Rfl: 3  Allergies  Allergen Reactions   Penicillins Swelling and Other (See Comments)    Swelling of hands and feet Skin peeling   Lamisil  [Terbinafine] Hives   Zestril [Lisinopril] Cough   Cipro  [Ciprofloxacin  Hcl] Hives and Rash    Objective:   BP 106/62 (BP Location: Left Arm, Patient Position: Sitting, Cuff Size: Normal)   Pulse 64   Temp 98.4 F (36.9 C)   Ht 5' 11.5" (1.816 m)   Wt 228 lb 9.6 oz (103.7 kg)   SpO2 98%   BMI 31.44 kg/m      08/02/2023   11:36 AM 08/02/2023   11:26 AM 08/02/2023   11:18 AM  Vitals with BMI  Height   5' 11.5"  Weight   228 lbs 10 oz  BMI   31.44  Systolic 106 100 89  Diastolic 62 58 68  Pulse   64     Physical Exam Vitals and nursing note reviewed.  Constitutional:      Appearance: Normal appearance. He is normal weight.  HENT:     Head: Normocephalic and atraumatic.  Cardiovascular:     Rate and Rhythm: Normal rate and regular rhythm.     Pulses: Normal pulses.  Heart sounds: Normal heart sounds.  Pulmonary:     Effort: Pulmonary effort is normal.     Breath sounds: Normal breath sounds.  Feet:     Right foot:     Skin integrity: Skin integrity normal.     Comments: Unable to view entire foot or great toe Skin:    General: Skin is warm and dry.     Capillary Refill: Capillary refill takes less than 2 seconds.  Neurological:     General: No focal deficit present.     Mental Status: He is alert and oriented to person, place, and time. Mental status is at baseline.  Psychiatric:        Mood and Affect: Mood normal.        Behavior: Behavior normal.        Thought Content: Thought content normal.        Judgment: Judgment normal.     Assessment & Plan:  History of amputation of left great toe [facility]) Assessment & Plan: Doing great post-op. Cultures still pending, continue abx as prescribed and complete course. Aaron Fox is pain free and ambulating well. Has post-op appt with Podiatry in 5 days. Needs ABI, provided with number to vascular for scheduling. No further concerns today. Needs follow up with PCP for chronic condition management and CPE.     Osteomyelitis of great toe of left foot (HCC)     Follow up plan: Return for with PCP for routine care.  Jenelle Mis, FNP

## 2023-08-02 NOTE — Assessment & Plan Note (Signed)
 Doing great post-op. Cultures still pending, continue abx as prescribed and complete course. Aaron Fox is pain free and ambulating well. Has post-op appt with Podiatry in 5 days. Needs ABI, provided with number to vascular for scheduling. No further concerns today. Needs follow up with PCP for chronic condition management and CPE.

## 2023-08-07 ENCOUNTER — Ambulatory Visit (INDEPENDENT_AMBULATORY_CARE_PROVIDER_SITE_OTHER): Admitting: Podiatry

## 2023-08-07 DIAGNOSIS — Z9889 Other specified postprocedural states: Secondary | ICD-10-CM | POA: Diagnosis not present

## 2023-08-07 DIAGNOSIS — M869 Osteomyelitis, unspecified: Secondary | ICD-10-CM

## 2023-08-07 LAB — AEROBIC/ANAEROBIC CULTURE W GRAM STAIN (SURGICAL/DEEP WOUND): Gram Stain: NONE SEEN

## 2023-08-07 NOTE — Progress Notes (Signed)
  Subjective:  Patient ID: Aaron Fox, male    DOB: [birth year],  MRN: [id number]   DOS: 07/30/23 Procedure: 1. Amputation of left great toe mid proximal phalanx level, left foot   68 y.o. male seen for post op check. 1 week s/p L hallux amp. Doing well no concerns. Denies pain. Finished Abx. Walking in post op shoe  Review of Systems: Negative except as noted in the HPI. Denies N/V/F/Ch.   Objective:   Constitutional Well developed. Well nourished.  Vascular Foot warm and well perfused. Capillary refill normal to all digits.   No calf pain with palpation  Neurologic Normal speech. Oriented to person, place, and time. Epicritic sensation diminished to digit level  Dermatologic Amp site healthy no dehiscence maceration no drainage or erythema well coapted   Orthopedic: S/p L hallux amputation through mid proximal phalanx   Radiographs: S/p amputation of L hallux mid proximal phalanx level  Pathology: L great toe amp: Cutaneous ulcer with gangrenous necrosis and acute cellulitis with  underlying marked acute osteomyelitis  Proximal bone margin negative for osteomyelitis   Micro:  ABUNDANT PROTEUS MIRABILIS  RARE CITROBACTER FREUNDII  FEW GROUP B STREP(S.AGALACTIAE)ISOLATED   Assessment:   1. Osteomyelitis of great toe of left foot (HCC)   2. Post-operative state    S/p Left hallux amputation through mid proximal phalanx level  Plan:  Patient was evaluated and treated and all questions answered.  1 week s/p L hallux amputation -Progressing well post op no issues, amp site healing very well -XR: Expected post op changes -WB Status: WBAT in post op shoe -Sutures: remain intact 1 more week -Medications/ABX: s/p Augmentin no further Abx needed -Dressing: Reamin clean dry intact until follow up next week - F/u Plan: Follow up in office next week Tuesday for suture removal and amp site check        Maridee Shoemaker, DPM [facility] / [facility]

## 2023-08-10 ENCOUNTER — Ambulatory Visit (HOSPITAL_COMMUNITY)
Admission: RE | Admit: 2023-08-10 | Discharge: 2023-08-10 | Disposition: A | Discharge status: Home / Self Care | Source: Ambulatory Visit | Attending: Obstetrics and Gynecology | Admitting: Obstetrics and Gynecology

## 2023-08-10 DIAGNOSIS — M869 Osteomyelitis, unspecified: Secondary | ICD-10-CM

## 2023-08-10 LAB — VAS US ABI WITH/WO TBI
Left ABI: 1.05
Right ABI: 1.2

## 2023-08-16 ENCOUNTER — Ambulatory Visit (INDEPENDENT_AMBULATORY_CARE_PROVIDER_SITE_OTHER): Admitting: Podiatry

## 2023-08-16 DIAGNOSIS — M869 Osteomyelitis, unspecified: Secondary | ICD-10-CM

## 2023-08-16 DIAGNOSIS — Z9889 Other specified postprocedural states: Secondary | ICD-10-CM

## 2023-08-16 NOTE — Progress Notes (Signed)
  Subjective:  Patient ID: DAVONTE SIEBENALER, male    DOB: 12/23/1955,  MRN: [id number]   DOS: 07/30/23 Procedure: 1. Amputation of left great toe mid proximal phalanx level, left foot   68 y.o. male seen for post op check. 2 week s/p L hallux amp.  He reports he is doing well denies any drainage.  Has left dressing clean dry and intact.  Walking postop shoe  Review of Systems: Negative except as noted in the HPI. Denies N/V/F/Ch.   Objective:   Constitutional Well developed. Well nourished.  Vascular Foot warm and well perfused. Capillary refill normal to all digits.   No calf pain with palpation  Neurologic Normal speech. Oriented to person, place, and time. Epicritic sensation diminished to digit level  Dermatologic Amp site healthy no dehiscence maceration no drainage or erythema well coapted   Orthopedic: S/p L hallux amputation through mid proximal phalanx   Radiographs: S/p amputation of L hallux mid proximal phalanx level  Pathology: L great toe amp: Cutaneous ulcer with gangrenous necrosis and acute cellulitis with  underlying marked acute osteomyelitis  Proximal bone margin negative for osteomyelitis   Micro:  ABUNDANT PROTEUS MIRABILIS  RARE CITROBACTER FREUNDII  FEW GROUP B STREP(S.AGALACTIAE)ISOLATED   Assessment:   1. Osteomyelitis of great toe of left foot (HCC)   2. Post-operative state     S/p Left hallux amputation through mid proximal phalanx level  Plan:  Patient was evaluated and treated and all questions answered.  2 week s/p L hallux amputation -Progressing well post op no issues, amp site healing very well, improved healing from prior -XR: Expected post op changes -WB Status: WBAT in post op shoe for another 2 weeks -Sutures: Removed in total today -Medications/ABX: s/p Augmentin no further Abx needed -Dressing:   - F/u Plan: Follow up in 2 weeks for possibly final recheck if healing continues to progress as it has been        Maridee Shoemaker, DPM [facility] / [facility]

## 2023-08-28 ENCOUNTER — Ambulatory Visit (INDEPENDENT_AMBULATORY_CARE_PROVIDER_SITE_OTHER): Admitting: Podiatry

## 2023-08-28 DIAGNOSIS — M869 Osteomyelitis, unspecified: Secondary | ICD-10-CM | POA: Diagnosis not present

## 2023-08-28 DIAGNOSIS — Z9889 Other specified postprocedural states: Secondary | ICD-10-CM

## 2023-08-28 DIAGNOSIS — L97522 Non-pressure chronic ulcer of other part of left foot with fat layer exposed: Secondary | ICD-10-CM | POA: Diagnosis not present

## 2023-08-28 MED ORDER — GENTAMICIN SULFATE 0.1 % EX OINT: 1.0000 | TOPICAL_OINTMENT | Freq: Every day | CUTANEOUS | 0 refills | Status: DC

## 2023-08-28 MED ORDER — DOXYCYCLINE HYCLATE 100 MG PO TABS: 100.0000 mg | ORAL_TABLET | Freq: Two times a day (BID) | ORAL | 0 refills | Status: AC

## 2023-08-28 NOTE — Progress Notes (Signed)
  Subjective:  Patient ID: Aaron Fox, male    DOB: 30-Apr-1955,  MRN: [id number]   DOS: 07/30/23 Procedure: 1. Amputation of left great toe mid proximal phalanx level, left foot   68 y.o. male seen for post op check. 4 week s/p L hallux amp.  He reports he is doing well denies any drainage.  He does report new ulceration of the distal aspect of the second toe on the left foot.  Waking in post op shoe   Review of Systems: Negative except as noted in the HPI. Denies N/V/F/Ch.   Objective:   Constitutional Well developed. Well nourished.  Vascular Foot warm and well perfused. Capillary refill normal to all digits.   No calf pain with palpation  Neurologic Normal speech. Oriented to person, place, and time. Epicritic sensation diminished to digit level  Dermatologic Amp site at left hallux MPJ is fully healed New ulceration distal aspect left second toe proximal 45 cm diameter Very superficial with healthy granular tissue wound bed \   Orthopedic: S/p L hallux amputation through mid proximal phalanx   Radiographs: S/p amputation of L hallux mid proximal phalanx level  Pathology: L great toe amp: Cutaneous ulcer with gangrenous necrosis and acute cellulitis with  underlying marked acute osteomyelitis  Proximal bone margin negative for osteomyelitis   Micro:  ABUNDANT PROTEUS MIRABILIS  RARE CITROBACTER FREUNDII  FEW GROUP B STREP(S.AGALACTIAE)ISOLATED   Assessment:   1. Osteomyelitis of great toe of left foot (HCC)   2. Skin ulcer of second toe of left foot with fat layer exposed (HCC)   3. Post-operative state      S/p Left hallux amputation through mid proximal phalanx level  Plan:  Patient was evaluated and treated and all questions answered.  # Left second toe ulceration superficial to subcutaneous fat tissue  -We discussed the etiology and factors that are a part of the wound healing process.  We also discussed the risk of infection both soft tissue and  osteomyelitis from open ulceration.  Discussed the risk of limb loss if this happens or worsens. -Debridement as below. -Dressed with antibiotic ointment, DSD. -Continue home dressing changes daily with gentamicin ointment and dry gauze dressing -Continue off-loading with surgical shoe. -Vascular testing deferred -HgbA1c: 6.9 -Last antibiotics: E Rx for doxycycline  100 mg twice daily for 10 days -Imaging: X-ray deferred will consider next appointment if worsening -I certify that this diagnosis represents a distinct and separate diagnosis that requires evaluation and treatment separate from other procedures or diagnosis   Procedure: Excisional Debridement of Wound Rationale: Removal of non-viable soft tissue from the wound to promote healing.  Anesthesia: none Post-Debridement Wound Measurements: 1.5 cm x 1.5 cm x 0.1 cm  Type of Debridement: Sharp Excisional Tissue Removed: Non-viable soft tissue Depth of Debridement: subcutaneous tissue. Technique: Sharp excisional debridement to bleeding, viable wound base.  Dressing: Dry, sterile, compression dressing. Disposition: Patient tolerated procedure well.   Follow-up in 2 weeks    4 week s/p L hallux amputation - Left hallux amputation site fully healed -XR: Expected post op changes -WB Status: WBAT in post op shoe due to second toe ulceration        Aaron Fox, DPM [facility] / [facility]

## 2023-09-06 DIAGNOSIS — E119 Type 2 diabetes mellitus without complications: Secondary | ICD-10-CM | POA: Diagnosis not present

## 2023-09-06 DIAGNOSIS — Z794 Long term (current) use of insulin: Secondary | ICD-10-CM | POA: Diagnosis not present

## 2023-09-07 LAB — T4, FREE: Free T4: 1.17 ng/dL (ref 0.82–1.77)

## 2023-09-07 LAB — COMPREHENSIVE METABOLIC PANEL WITH GFR
ALT: 20 IU/L (ref 0–44)
AST: 27 IU/L (ref 0–40)
Albumin: 4 g/dL (ref 3.9–4.9)
Alkaline Phosphatase: 99 IU/L (ref 44–121)
BUN/Creatinine Ratio: 15 (ref 10–24)
BUN: 21 mg/dL (ref 8–27)
Bilirubin Total: 0.4 mg/dL (ref 0.0–1.2)
CO2: 18 mmol/L — ABNORMAL LOW (ref 20–29)
Calcium: 9.2 mg/dL (ref 8.6–10.2)
Chloride: 106 mmol/L (ref 96–106)
Creatinine, Ser: 1.36 mg/dL — ABNORMAL HIGH (ref 0.76–1.27)
Globulin, Total: 3.3 g/dL (ref 1.5–4.5)
Glucose: 82 mg/dL (ref 70–99)
Potassium: 4.6 mmol/L (ref 3.5–5.2)
Sodium: 142 mmol/L (ref 134–144)
Total Protein: 7.3 g/dL (ref 6.0–8.5)
eGFR: 57 mL/min/1.73 — ABNORMAL LOW (ref >59)

## 2023-09-07 LAB — LIPID PANEL
Chol/HDL Ratio: 2.7 ratio (ref 0.0–5.0)
Cholesterol, Total: 120 mg/dL (ref 100–199)
HDL: 45 mg/dL (ref >39)
LDL Chol Calc (NIH): 61 mg/dL (ref 0–99)
Triglycerides: 65 mg/dL (ref 0–149)
VLDL Cholesterol Cal: 14 mg/dL (ref 5–40)

## 2023-09-07 LAB — TSH: TSH: 0.919 u[IU]/mL (ref 0.450–4.500)

## 2023-09-13 ENCOUNTER — Ambulatory Visit: Admitting: Podiatry

## 2023-09-13 ENCOUNTER — Ambulatory Visit (INDEPENDENT_AMBULATORY_CARE_PROVIDER_SITE_OTHER)

## 2023-09-13 ENCOUNTER — Encounter: Payer: Self-pay | Admitting: Podiatry

## 2023-09-13 DIAGNOSIS — M869 Osteomyelitis, unspecified: Secondary | ICD-10-CM

## 2023-09-13 DIAGNOSIS — Z9889 Other specified postprocedural states: Secondary | ICD-10-CM

## 2023-09-13 DIAGNOSIS — L97522 Non-pressure chronic ulcer of other part of left foot with fat layer exposed: Secondary | ICD-10-CM

## 2023-09-13 NOTE — Progress Notes (Signed)
  Subjective:  Patient ID: Aaron Fox, male    DOB: 1955/10/12,  MRN: [id number]   DOS: 07/30/23 Procedure: 1. Amputation of left great toe mid proximal phalanx level, left foot   68 y.o. male seen for post op check. 6 week s/p L hallux amp.   Also following for ulceration on the left second distal aspect.  Debrided last visit and recommend he apply antibiotic ointment and bandage dressing daily  Review of Systems: Negative except as noted in the HPI. Denies N/V/F/Ch.   Objective:   Constitutional Well developed. Well nourished.  Vascular Foot warm and well perfused. Capillary refill normal to all digits.   No calf pain with palpation  Neurologic Normal speech. Oriented to person, place, and time. Epicritic sensation diminished to digit level  Dermatologic Amp site at left hallux MPJ is fully healed ulceration distal aspect left second toe proximal 1.5 cm diameter Very superficial with healthy granular tissue wound bed Increased erythema and edema of the left second toe with hammertoe deformity     Orthopedic: S/p L hallux amputation through mid proximal phalanx   Radiographs: XR 3 views of the left foot AP lateral and oblique 09/13/2023 findings: No evidence of osteolysis or erosion of the distal phalanx of the second toe there is hammertoe deformity noted.  Pathology: L great toe amp: Cutaneous ulcer with gangrenous necrosis and acute cellulitis with  underlying marked acute osteomyelitis  Proximal bone margin negative for osteomyelitis   Micro:  ABUNDANT PROTEUS MIRABILIS  RARE CITROBACTER FREUNDII  FEW GROUP B STREP(S.AGALACTIAE)ISOLATED   Assessment:   1. Osteomyelitis of great toe of left foot (HCC)   2. Skin ulcer of second toe of left foot with fat layer exposed (HCC)   3. Post-operative state       S/p Left hallux amputation through mid proximal phalanx level  Plan:  Patient was evaluated and treated and all questions answered.  # Left second toe  ulceration superficial to subcutaneous fat tissue  -Unfortunately the ulceration has not improved and there is still significant edema of the left second toe with some erythema -X-ray taken -no evidence of obvious osteolysis or erosion of the distal phalanx -Will order MRI to assess further for osteomyelitis - Defer debridement -Dressed with antibiotic ointment, DSD. -Continue home dressing changes daily with gentamicin  ointment and dry gauze dressing -Continue off-loading with surgical shoe. -Vascular testing deferred -HgbA1c: 6.9 -Last antibiotics: Status post course of doxycycline  monitor off for now -Imaging: X-ray taken today no evidence of erosion will order MRI   4 week s/p L hallux amputation - Left hallux amputation site fully healed -XR: Expected post op changes -WB Status: WBAT in post op shoe due to second toe ulceration        [name], DPM [facility] / [facility]

## 2023-09-14 ENCOUNTER — Encounter: Admitting: Family Medicine

## 2023-09-14 ENCOUNTER — Ambulatory Visit: Admitting: Nurse Practitioner

## 2023-09-14 ENCOUNTER — Encounter: Payer: Self-pay | Admitting: Nurse Practitioner

## 2023-09-14 VITALS — BP 118/60 | HR 59 | Ht 71.5 in | Wt 227.0 lb

## 2023-09-14 DIAGNOSIS — E119 Type 2 diabetes mellitus without complications: Secondary | ICD-10-CM | POA: Diagnosis not present

## 2023-09-14 DIAGNOSIS — I10 Essential (primary) hypertension: Secondary | ICD-10-CM

## 2023-09-14 DIAGNOSIS — Z794 Long term (current) use of insulin: Secondary | ICD-10-CM

## 2023-09-14 DIAGNOSIS — E782 Mixed hyperlipidemia: Secondary | ICD-10-CM | POA: Diagnosis not present

## 2023-09-14 NOTE — Progress Notes (Signed)
 Endocrinology Follow Up Note       09/14/2023, 8:19 AM   Subjective:    Patient ID: Aaron Fox, male    DOB: Oct 29, 1955.  Aaron Fox is being seen in follow up after being seen in consultation for management of currently uncontrolled symptomatic diabetes requested by  Aaron Butler DASEN, MD.   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   Lymphadenopathy    Peripheral neuropathy    PUD (peptic ulcer disease)    Rheumatoid arthritis(714.0) 09/22/2006    Past Surgical History:  Procedure Laterality Date   ACHILLES TENDON SURGERY Right 04/25/2019   Procedure: ACHILLES LENGTHENING;  Surgeon: Gershon Donnice SAUNDERS, DPM;  Location: [facility];  Service: Podiatry;  Laterality: Right;   AMPUTATION Right 04/25/2019   Procedure: AMPUTATION FOOT,TRANSMETATARSAL;  Surgeon: Gershon Donnice SAUNDERS, DPM;  Location: [facility];  Service: Podiatry;  Laterality: Right;   AMPUTATION TOE Left 07/30/2023   Procedure: AMPUTATION, TOE;  Surgeon: Malvin Marsa FALCON, DPM;  Location: [facility];  Service: Orthopedics/Podiatry;  Laterality: Left;  LEFT GREAT TOE AMPUTAION   CATARACT EXTRACTION Bilateral    Dr. Lavonia   CATARACT EXTRACTION, BILATERAL     COLONOSCOPY  2004   LYMPH NODE BIOPSY Right 10/16/2014   Procedure: RIGHT INGUINAL LYMPH NODE BIOPSY;  Surgeon: Oneil Budge, MD;  Location: [facility];  Service:  General;  Laterality: Right;    Social History   Socioeconomic History   Marital status: Married    Spouse name: Not on file   Number of children: 3   Years of education: Not on file   Highest education level: Not on file  Occupational History   Occupation: Disability    Employer: [employer]  Tobacco Use   Smoking status: Former    Current packs/day: 0.00    Types: Cigarettes    Quit date: 11/02/1999    Years since quitting: 23.8   Smokeless tobacco: Never  Vaping Use   Vaping status: Never Used  Substance and Sexual Activity   Alcohol use: No    Alcohol/week: 0.0 standard drinks of alcohol   Drug use: No   Sexual activity: Not Currently  Other Topics Concern   Not on file  Social History Narrative   ** Merged History Encounter ** Married, 3 kids   Prior work [employer] before disability   Regular exercise-yes   Works some doing Aeronautical engineer    Social Drivers of Corporate investment banker Strain: Low Risk  (02/15/2023)   Overall Financial Resource Strain (CARDIA)  Difficulty of Paying Living Expenses: Not hard at all  Food Insecurity: No Food Insecurity (08/01/2023)   Hunger Vital Sign    Worried About Running Out of Food in the Last Year: Never true    Ran Out of Food in the Last Year: Never true  Transportation Needs: No Transportation Needs (08/01/2023)   PRAPARE - Administrator, Civil Service (Medical): No    Lack of Transportation (Non-Medical): No  Physical Activity: Sufficiently Active (02/15/2023)   Exercise Vital Sign    Days of Exercise per Week: 5 days    Minutes of Exercise per Session: 80 min  Stress: No Stress Concern Present (02/15/2023)   [employer] of Occupational Health - Occupational Stress Questionnaire    Feeling of Stress : Not at all  Social Connections: Socially Integrated (07/27/2023)   Social Connection and Isolation Panel    Frequency of Communication with Friends and Family: More than three times a week    Frequency of  Social Gatherings with Friends and Family: More than three times a week    Attends Religious Services: More than 4 times per year    Active Member of [employer] or Organizations: Yes    Attends Engineer, structural: More than 4 times per year    Marital Status: Married    Family History  Problem Relation Age of Onset   Heart disease Other        FH of CAD   Colon polyps Paternal Uncle    Kidney disease Paternal Uncle    Diabetes Paternal Grandfather    Diabetes Maternal Uncle    Cancer Neg Hx    Colon cancer Neg Hx    Gallbladder disease Neg Hx    Esophageal cancer Neg Hx    Rectal cancer Neg Hx    Stomach cancer Neg Hx     Outpatient Encounter Medications as of 09/14/2023  Medication Sig   Accu-Chek Softclix Lancets lancets TEST BLOOD SUGAR 7 TIMES DAILY   aspirin  81 MG tablet Take 81 mg by mouth daily.   atorvastatin  (LIPITOR) 10 MG tablet TAKE 1 TABLET EVERY DAY   gabapentin  (NEURONTIN ) 300 MG capsule TAKE 2 CAPSULES THREE TIMES DAILY   gentamicin  ointment (GARAMYCIN ) 0.1 % Apply 1 Application topically daily. Apply once daily to left 2nd toe ulcer   glucose blood (ACCU-CHEK GUIDE TEST) test strip Use as instructed to monitor glucose 4 times daily   insulin  isophane & regular human KwikPen (NOVOLIN  70/30 KWIKPEN) (70-30) 100 UNIT/ML KwikPen Inject 10 Units into the skin 2 (two) times daily before a meal.   INSULIN  SYRINGE 1CC/29G (DROPLET INSULIN  SYRINGE) 29G X 1/2 1 ML MISC Use to inject in morning and bedtime   losartan  (COZAAR ) 50 MG tablet TAKE 1 TABLET EVERY DAY   Multiple Vitamins-Minerals (MULTIVITAMIN MEN 50+) TABS Take 1 tablet by mouth daily.   Tetrahydrozoline HCl (VISINE OP) Place 1 drop into both eyes daily as needed (redness).   triamcinolone  cream (KENALOG ) 0.1 % APPLY TO THE AFFECTED AREA(S) TOPICALLY THREE TIMES DAILY AS NEEDED FOR RASH   No facility-administered encounter medications on file as of 09/14/2023.    ALLERGIES: Allergies  Allergen  Reactions   Penicillins Swelling and Other (See Comments)    Swelling of hands and feet Skin peeling   Lamisil [Terbinafine] Hives   Zestril [Lisinopril] Cough   Cipro  [Ciprofloxacin  Hcl] Hives and Rash    VACCINATION STATUS: Immunization History  Administered Date(s) Administered   Fluad Quad(high Dose  65+) 03/03/2022   Fluad Trivalent(High Dose 65+) 01/12/2023   Influenza Whole 12/28/2008   Influenza,inj,Quad PF,6+ Mos 11/21/2013, 04/01/2015, 12/20/2015, 01/08/2017, 12/17/2017, 10/25/2018, 12/19/2019, 03/28/2021   Moderna Sars-Covid-2 Vaccination 04/29/2019, 05/27/2019, 12/29/2019   Pneumococcal Polysaccharide-23 06/11/2008   Td 09/29/2005   Tdap 10/15/2017   Zoster Recombinant(Shingrix) 06/14/2020, 08/25/2020    Diabetes He presents for his follow-up diabetic visit. He has type 2 diabetes mellitus. Onset time: was diagnosed at approx age of 35. His disease course has been improving. There are no hypoglycemic associated symptoms. Associated symptoms include foot paresthesias. Pertinent negatives for diabetes include no blurred vision, no chest pain, no fatigue, no polydipsia, no polyphagia, no polyuria and no weight loss. There are no hypoglycemic complications. Symptoms are stable. Diabetic complications include heart disease, impotence, nephropathy, peripheral neuropathy and retinopathy. 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 Current diabetic treatment includes oral agent (monotherapy) and insulin  injections. He is compliant with treatment most of the time. His weight is fluctuating minimally. He is following a generally healthy diet. When asked about meal planning, he reported none. He has not had a previous visit with a dietitian. He participates in exercise three times a week. His home blood glucose trend is decreasing steadily. His overall blood  glucose range is 110-130 mg/dl. (He presents today with his logs showing at target glycemic profile overall.  His most recent A1c on 5/30 was 6.9% improving from last visit of 7.4%.  He does have occasional drops in glucose likely due to meal timing being off.  Analysis of his meter shows 7-day average of 111, 14-day average of 112, 30-day average of 114, 90-day average of 123.  He had toe amputation between visits but overall is doing well.) An ACE inhibitor/angiotensin II receptor blocker is being taken. He sees a podiatrist.Eye exam is current.  Hyperlipidemia This is a chronic problem. The current episode started more than 1 year ago. The problem is controlled. Recent lipid tests were reviewed and are normal. Exacerbating diseases include chronic renal disease and diabetes. There are no known factors aggravating his hyperlipidemia. Pertinent negatives include no chest pain. Current antihyperlipidemic treatment includes statins. The current treatment provides moderate improvement of lipids. There are no compliance problems.  Risk factors for coronary artery disease include diabetes mellitus, dyslipidemia, hypertension and male sex.  Hypertension This is a chronic problem. The current episode started more than 1 year ago. The problem has been gradually improving since onset. The problem is controlled. Pertinent negatives include no blurred vision or chest pain. There are no associated agents to hypertension. Risk factors for coronary artery disease include diabetes mellitus, dyslipidemia and male gender. Past treatments include angiotensin blockers. The current treatment provides moderate improvement. There are no compliance problems.  Hypertensive end-organ damage includes kidney disease, heart failure and retinopathy. Identifiable causes of hypertension include chronic renal disease.    Review of systems  Constitutional: + Minimally fluctuating body weight,  current Body mass index is 31.22 kg/m. , no  fatigue, no subjective hyperthermia, no subjective hypothermia Eyes: no blurry vision, no xerophthalmia ENT: no sore throat, no nodules palpated in throat, no dysphagia/odynophagia, no hoarseness Cardiovascular: no chest pain, no shortness of breath, no palpitations, no leg swelling Respiratory: no cough, no shortness of breath Gastrointestinal: no nausea/vomiting/diarrhea Musculoskeletal: no muscle/joint aches, left foot in ortho boot from recent toe amputation Skin: no rashes, no hyperemia Neurological: no tremors, no numbness,  no tingling, no dizziness Psychiatric: no depression, no anxiety  Objective:     BP 118/60 (BP Location: Left Arm, Patient Position: Sitting, Cuff Size: Large)   Pulse (!) 59   Ht 5' 11.5 (1.816 m)   Wt 227 lb (103 kg)   BMI 31.22 kg/m   Wt Readings from Last 3 Encounters:  09/14/23 227 lb (103 kg)  08/02/23 228 lb 9.6 oz (103.7 kg)  07/30/23 (P) 225 lb (102.1 kg)     BP Readings from Last 3 Encounters:  09/14/23 118/60  08/02/23 106/62  07/31/23 (!) 117/55       Physical Exam- Limited  Constitutional:  Body mass index is 31.22 kg/m. , not in acute distress, normal state of mind Eyes:  EOMI, no exophthalmos Musculoskeletal: no gross deformities, strength intact in all four extremities, no gross restriction of joint movements, left foot in ortho boot from recent toe amputation Skin:  no rashes, no hyperemia Neurological: no tremor with outstretched hands   Diabetic Foot Exam - Simple   No data filed     CMP ( most recent) CMP     Component Value Date/Time   NA 142 09/06/2023 0805   K 4.6 09/06/2023 0805   CL 106 09/06/2023 0805   CO2 18 (L) 09/06/2023 0805   GLUCOSE 82 09/06/2023 0805   GLUCOSE 172 (H) 07/31/2023 0539   BUN 21 09/06/2023 0805   CREATININE 1.36 (H) 09/06/2023 0805   CREATININE 1.03 03/06/2022 1628   CALCIUM  9.2 09/06/2023 0805   PROT 7.3 09/06/2023 0805   ALBUMIN 4.0 09/06/2023 0805   AST 27 09/06/2023 0805    ALT 20 09/06/2023 0805   ALKPHOS 99 09/06/2023 0805   BILITOT 0.4 09/06/2023 0805   GFRNONAA >60 07/31/2023 0539   GFRNONAA 76 05/07/2020 0816   GFRAA 89 05/07/2020 0816     Diabetic Labs (most recent): Lab Results  Component Value Date   HGBA1C 6.9 (H) 07/27/2023   HGBA1C 7.4 (A) 05/11/2023   HGBA1C 9.0 (A) 02/02/2023   MICROALBUR 150 10/20/2021   MICROALBUR 7.9 05/07/2020   MICROALBUR 1.2 02/08/2009     Lipid Panel ( most recent) Lipid Panel     Component Value Date/Time   CHOL 120 09/06/2023 0805   TRIG 65 09/06/2023 0805   HDL 45 09/06/2023 0805   CHOLHDL 2.7 09/06/2023 0805   CHOLHDL 2.7 05/07/2020 0816   VLDL 33.0 10/15/2017 0818   LDLCALC 61 09/06/2023 0805   LDLCALC 65 05/07/2020 0816   LDLDIRECT 120.2 04/04/2013 0918   LABVLDL 14 09/06/2023 0805      Lab Results  Component Value Date   TSH 0.919 09/06/2023   TSH 1.490 04/15/2021   TSH 1.10 10/15/2017   TSH 1.63 04/28/2016   TSH 0.99 05/21/2014   TSH 1.43 04/04/2013   TSH 1.28 11/13/2011   TSH 0.77 03/29/2010   TSH 0.95 09/01/2009   TSH 0.71 02/08/2009   FREET4 1.17 09/06/2023   FREET4 1.32 04/15/2021           Assessment & Plan:   1) Controlled Type 2 Diabetes without complication  He presents today with his logs showing at target glycemic profile overall.  His most recent A1c on 5/30 was 6.9% improving from last visit of 7.4%.  He does have occasional drops in glucose likely due to meal timing being off.  Analysis of his meter shows 7-day average of 111, 14-day average of 112, 30-day average of 114, 90-day average of 123.  He  had toe amputation between visits but overall is doing well.  - Aaron Fox has currently uncontrolled symptomatic type 2 DM since 68 years of age.  -Recent labs reviewed.  - I had a long discussion with him about the progressive nature of diabetes and the pathology behind its complications. -his diabetes is complicated by retinopathy, CKD, neuropathy, recent  TMA and he remains at a high risk for more acute and chronic complications which include CAD, CVA, CKD, retinopathy, and neuropathy. These are all discussed in detail with him.  - Nutritional counseling repeated at each appointment due to patients tendency to fall back in to old habits.  - The patient admits there is a room for improvement in their diet and drink choices. -  Suggestion is made for the patient to avoid simple carbohydrates from their diet including Cakes, Sweet Desserts / Pastries, Ice Cream, Soda (diet and regular), Sweet Tea, Candies, Chips, Cookies, Sweet Pastries, Store Bought Juices, Alcohol in Excess of 1-2 drinks a day, Artificial Sweeteners, Coffee Creamer, and Sugar-free Products. This will help patient to have stable blood glucose profile and potentially avoid unintended weight gain.   - I encouraged the patient to switch to unprocessed or minimally processed complex starch and increased protein intake (animal or plant source), fruits, and vegetables.   - Patient is advised to stick to a routine mealtimes to eat 3 meals a day and avoid unnecessary snacks (to snack only to correct hypoglycemia).  - I have approached him with the following individualized plan to manage  his diabetes and patient agrees:   -He is advised to continue premixed 70/30 insulin  (he buys this OTC at [facility]) at 10 units twice daily if glucose is above 90 and he is eating.   -he is encouraged to continue monitoring blood glucose 2 times daily, before breakfast and before bed, and to call the clinic if he has readings less than 70 or greater than 300 for 3 tests in a row.  I approached him about trying a CGM to help monitor glucose since we are re-initiating treatment with insulin .  I sent for Dexcom G7 sensor to [facility] but it was too expensive for him to afford.  - he is warned not to take insulin  without proper monitoring per orders. - Adjustment parameters are given to him for hypo and  hyperglycemia in writing.  -He did not tolerate GLP1- had significant GI symptoms.  He did not tolerate SGLT2i due to yeast infection in abdominal folds.  - Specific targets for  A1c;  LDL, HDL,  and Triglycerides were discussed with the patient.  2) Blood Pressure /Hypertension:  his blood pressure is controlled to target.   he is advised to continue his current medications including Cozaar  50 mg p.o. daily with breakfast.    3) Lipids/Hyperlipidemia:    Review of his recent lipid panel from 09/06/23 showed controlled  LDL at 61 .  he  is advised to continue Lipitor 10 mg daily at bedtime.  Side effects and precautions discussed with him.    4)  Weight/Diet:  his Body mass index is 31.22 kg/m.  -   clearly complicating his diabetes care.   he is a candidate for weight loss. I discussed with him the fact that loss of 5 - 10% of his  current body weight will have the most impact on his diabetes management.  Exercise, and detailed carbohydrates information provided  -  detailed on discharge instructions.  5) Chronic Care/Health Maintenance: -  he is on ACEI/ARB and Statin medications and is encouraged to initiate and continue to follow up with Ophthalmology, Dentist, Podiatrist at least yearly or according to recommendations, and advised to stay away from smoking. I have recommended yearly flu vaccine and pneumonia vaccine at least every 5 years; moderate intensity exercise for up to 150 minutes weekly; and sleep for at least 7 hours a day.  - he is advised to maintain close follow up with Aaron Butler DASEN, MD for primary care needs, as well as his other providers for optimal and coordinated care.     I spent  14  minutes in the care of the patient today including review of labs from CMP, Lipids, Thyroid  Function, Hematology (current and previous including abstractions from other facilities); face-to-face time discussing  his blood glucose readings/logs, discussing hypoglycemia and hyperglycemia  episodes and symptoms, medications doses, his options of short and long term treatment based on the latest standards of care / guidelines;  discussion about incorporating lifestyle medicine;  and documenting the encounter. Risk reduction counseling performed per USPSTF guidelines to reduce obesity and cardiovascular risk factors.     Please refer to Patient Instructions for Blood Glucose Monitoring and Insulin /Medications Dosing Guide  in media tab for additional information. Please  also refer to  Patient Self Inventory in the Media  tab for reviewed elements of pertinent patient history.  Aaron Fox participated in the discussions, expressed understanding, and voiced agreement with the above plans.  All questions were answered to his satisfaction. he is encouraged to contact clinic should he have any questions or concerns prior to his return visit.   Follow up plan: - Return in about 6 months (around 03/16/2024) for Diabetes F/U with A1c in office, No previsit labs, Bring meter and logs.   Benton Rio, [facility] [address] [place], [place] [address] Phone: [phone] Fax: [phone]  09/14/2023, 8:19 AM

## 2023-09-15 ENCOUNTER — Ambulatory Visit
Admission: RE | Admit: 2023-09-15 | Discharge: 2023-09-15 | Disposition: A | Discharge status: Home / Self Care | Source: Ambulatory Visit | Attending: Podiatry

## 2023-09-15 DIAGNOSIS — M60074 Infective myositis, left foot: Secondary | ICD-10-CM | POA: Diagnosis not present

## 2023-09-15 DIAGNOSIS — L97522 Non-pressure chronic ulcer of other part of left foot with fat layer exposed: Secondary | ICD-10-CM

## 2023-09-15 DIAGNOSIS — Z89412 Acquired absence of left great toe: Secondary | ICD-10-CM | POA: Diagnosis not present

## 2023-09-15 DIAGNOSIS — R2242 Localized swelling, mass and lump, left lower limb: Secondary | ICD-10-CM | POA: Diagnosis not present

## 2023-09-18 ENCOUNTER — Telehealth: Payer: Self-pay | Admitting: Podiatry

## 2023-09-18 NOTE — Telephone Encounter (Signed)
 NOTIFIED PT IS SCHEDULED FOR SURGERY ON 7/30 AT 1215PM AT [facility] MAIN OR

## 2023-09-19 ENCOUNTER — Telehealth: Payer: Self-pay

## 2023-09-19 ENCOUNTER — Telehealth: Payer: Self-pay | Admitting: Podiatry

## 2023-09-19 NOTE — Telephone Encounter (Signed)
 DOS- 09/26/2023  AMPUTATION TOE INTERPHALANGEAL 2ND TOE LT- 71174  [employer] EFFECTIVE DATE- 02/27/2021  OOP- $6750 REMAINING- $5014 COINSURANCE- 0% WITH $450 COPAY  PER [employer] WEBSITE, NO PRIOR AUTH REQUIRED FOR CPT CODE 71174. DOCUMENTATION ATTACHED TO CONSENT PACKET

## 2023-09-19 NOTE — Telephone Encounter (Signed)
 Fax received from [facility] for surgical clearance for patient. Pt is having a toe amputation due to osteomyelitis.  Form placed in green folder. Thanks.

## 2023-09-24 ENCOUNTER — Other Ambulatory Visit: Payer: Self-pay

## 2023-09-24 ENCOUNTER — Encounter (HOSPITAL_COMMUNITY): Payer: Self-pay | Admitting: Podiatry

## 2023-09-24 NOTE — Progress Notes (Signed)
 PCP - Duanne Butler DASEN, MD  Cardiologist -   PPM/ICD - denies Device Orders - n/a Rep Notified - n/a  Chest x-ray -  EKG - DOS Stress Test - 05-21-15 (exercise) ECHO - 05-17-15 Cardiac Cath -   CPAP - denies    Fasting Blood Sugar - per patient ranges between 70-130 Checks Blood Sugar TID  Blood Thinner Instructions: denies Aspirin  Instructions: Instructed patient to follow up with surgeon for any further instructions  ERAS Protcol - clear liquids until 9:15 am.  COVID TEST- n/a  Anesthesia review: yes hx of Htn, Afib, DM  Patient verbally denies any shortness of breath, fever, cough and chest pain during phone call   -------------  SDW INSTRUCTIONS given:  Your procedure is scheduled on September 25, 3033.  Report to [facility] Main Entrance A at 9:45 A.M., and check in at the Admitting office.  Call this number if you have problems the morning of surgery:  [phone]   Remember:  Do not eat after midnight the night before your surgery  You may drink clear liquids until 9:15 the morning of your surgery.   Clear liquids allowed are: Water, Non-Citrus Juices (without pulp), Carbonated Beverages, Clear Tea, Black Coffee Only, and Gatorade    Take these medicines the morning of surgery with A SIP OF WATER  atorvastatin  (LIPITOR)  gabapentin  (NEURONTIN )    As of today, STOP taking any Aspirin  (unless otherwise instructed by your surgeon) Aleve, Naproxen, Ibuprofen, Motrin, Advil, Goody's, BC's, all herbal medications, fish oil, and all vitamins. WHAT DO I DO ABOUT MY DIABETES MEDICATION?   Do not take oral diabetes medicines (pills) the morning of surgery.  THE NIGHT BEFORE SURGERY, take 7 units of NOVOLIN  70/30 KWIKPEN insulin .       THE MORNING OF SURGERY, DO NOT TAKE insulin .  The day of surgery, do not take other diabetes injectables, including Byetta (exenatide), Bydureon (exenatide ER), Victoza (liraglutide), or Trulicity  (dulaglutide ).  If your CBG is  greater than 220 mg/dL, you may take  of your sliding scale (correction) dose of insulin .   HOW TO MANAGE YOUR DIABETES BEFORE AND AFTER SURGERY  Why is it important to control my blood sugar before and after surgery? Improving blood sugar levels before and after surgery helps healing and can limit problems. A way of improving blood sugar control is eating a healthy diet by:  Eating less sugar and carbohydrates  Increasing activity/exercise  Talking with your doctor about reaching your blood sugar goals High blood sugars (greater than 180 mg/dL) can raise your risk of infections and slow your recovery, so you will need to focus on controlling your diabetes during the weeks before surgery. Make sure that the doctor who takes care of your diabetes knows about your planned surgery including the date and location.  How do I manage my blood sugar before surgery? Check your blood sugar at least 4 times a day, starting 2 days before surgery, to make sure that the level is not too high or low.  Check your blood sugar the morning of your surgery when you wake up and every 2 hours until you get to the Short Stay unit.  If your blood sugar is less than 70 mg/dL, you will need to treat for low blood sugar: Do not take insulin . Treat a low blood sugar (less than 70 mg/dL) with  cup of clear juice (cranberry or apple), 4 glucose tablets, OR glucose gel. Recheck blood sugar in 15 minutes after treatment (  to make sure it is greater than 70 mg/dL). If your blood sugar is not greater than 70 mg/dL on recheck, call [phone] for further instructions. Report your blood sugar to the short stay nurse when you get to Short Stay.  If you are admitted to the hospital after surgery: Your blood sugar will be checked by the staff and you will probably be given insulin  after surgery (instead of oral diabetes medicines) to make sure you have good blood sugar levels. The goal for blood sugar control after surgery  is 80-180 mg/dL.                      Do not wear jewelry, make up, or nail polish            Do not wear lotions, powders, perfumes/colognes, or deodorant.            Do not shave 48 hours prior to surgery.  Men may shave face and neck.            Do not bring valuables to the hospital.            [facility] is not responsible for any belongings or valuables.  Do NOT Smoke (Tobacco/Vaping) 24 hours prior to your procedure If you use a CPAP at night, you may bring all equipment for your overnight stay.   Contacts, glasses, dentures or bridgework may not be worn into surgery.      For patients admitted to the hospital, discharge time will be determined by your treatment team.   Patients discharged the day of surgery will not be allowed to drive home, and someone needs to stay with them for 24 hours.    Special instructions:   [place]- Preparing For Surgery  Before surgery, you can play an important role. Because skin is not sterile, your skin needs to be as free of germs as possible. You can reduce the number of germs on your skin by washing with CHG (chlorahexidine gluconate) Soap before surgery.  CHG is an antiseptic cleaner which kills germs and bonds with the skin to continue killing germs even after washing.    Oral Hygiene is also important to reduce your risk of infection.  Remember - BRUSH YOUR TEETH THE MORNING OF SURGERY WITH YOUR REGULAR TOOTHPASTE  Please do not use if you have an allergy to CHG or antibacterial soaps. If your skin becomes reddened/irritated stop using the CHG.  Do not shave (including legs and underarms) for at least 48 hours prior to first CHG shower. It is OK to shave your face.  Please follow these instructions carefully.   Shower the NIGHT BEFORE SURGERY and the MORNING OF SURGERY with DIAL Soap.   Pat yourself dry with a CLEAN TOWEL.  Wear CLEAN PAJAMAS to bed the night before surgery  Place CLEAN SHEETS on your bed the night of your first  shower and DO NOT SLEEP WITH PETS.   Day of Surgery: Please shower morning of surgery  Wear Clean/Comfortable clothing the morning of surgery Do not apply any deodorants/lotions.   Remember to brush your teeth WITH YOUR REGULAR TOOTHPASTE.   Questions were answered. Patient verbalized understanding of instructions.

## 2023-09-25 NOTE — Anesthesia Preprocedure Evaluation (Signed)
 Anesthesia Evaluation  Patient identified by MRN, date of birth, ID band Patient awake    Reviewed: Allergy & Precautions, NPO status , Patient's Chart, lab work & pertinent test results, reviewed documented beta blocker date and time   Airway Mallampati: II  TM Distance: >3 FB Neck ROM: Full    Dental  (+) Poor Dentition, Edentulous Upper   Pulmonary neg shortness of breath, former smoker   Pulmonary exam normal breath sounds clear to auscultation       Cardiovascular hypertension, Pt. on medications +CHF  (-) Past MI Normal cardiovascular exam Rhythm:Regular Rate:Normal     Neuro/Psych neg Seizures Peripheral neuropathy Glaucoma  Neuromuscular disease  negative psych ROS   GI/Hepatic Neg liver ROS, PUD,,,  Endo/Other  diabetes, Well Controlled, Type 2, Insulin  Dependent  HLD Gout  Renal/GU Renal InsufficiencyRenal disease     Musculoskeletal  (+) Arthritis , Rheumatoid disorders,  Chronic osteomyelitis left foot   Abdominal   Peds  Hematology  (+) Blood dyscrasia, anemia On lovenox    Anesthesia Other Findings All: Cipro  PCN, Lamisil, Zestril  Reproductive/Obstetrics ED                              Anesthesia Physical Anesthesia Plan  ASA: 3  Anesthesia Plan: MAC   Post-op Pain Management: Minimal or no pain anticipated   Induction: Intravenous  PONV Risk Score and Plan: 2 and Treatment may vary due to age or medical condition, Midazolam , Ondansetron , Dexamethasone  and Propofol  infusion  Airway Management Planned: Nasal Cannula and Natural Airway  Additional Equipment: None  Intra-op Plan:   Post-operative Plan:   Informed Consent: I have reviewed the patients History and Physical, chart, labs and discussed the procedure including the risks, benefits and alternatives for the proposed anesthesia with the patient or authorized representative who has indicated his/her  understanding and acceptance.     Dental advisory given  Plan Discussed with: CRNA, Surgeon and Anesthesiologist  Anesthesia Plan Comments: (PAT note written 09/25/2023 by Allison Zelenak, PA-C.  )        Anesthesia Quick Evaluation

## 2023-09-25 NOTE — Progress Notes (Signed)
 Anesthesia Chart Review: CANDELARIA MICHAE POLITE  Case: [id number] Date/Time: 09/26/23 1205   Procedure: AMPUTATION, TOE (Left: Toe)   Anesthesia type: Monitor Anesthesia Care   Diagnosis: Chronic osteomyelitis of foot (HCC) [F13.320]   Pre-op diagnosis: OSTEOMYELITIS LEFT FOOT   [facility] / [facility]   Surgeons: Malvin Marsa FALCON, DPM       DISCUSSION: Patient is a 68 year old male scheduled for the above procedure.  He is status post amputation of left great toe mid proximal phalanx level on 07/30/2023 for osteomyelitis.  H&P form completed on 09/20/2023 by Dr. Duanne. He also had endocrinology follow-up with Benton Rio, NP on 09/14/2023.  History includes former smoker (quit 11/02/99), hypercholesterolemia, RA, DM2 (with neuropathy), CKD, HTN, PAF (remote, > 15 years), CHF, GERD, gout, osteomyelitis (right TMA 04/25/19, left great toe 07/30/23), glaucoma.  Anesthesia team to evaluate on the day of surgery.   VS: Ht 5' 11 (1.803 m)   Wt 103 kg   BMI 31.67 kg/m   BP Readings from Last 3 Encounters:  09/14/23 118/60  08/02/23 106/62  07/31/23 (!) 117/55   Pulse Readings from Last 3 Encounters:  09/14/23 (!) 59  08/02/23 64  07/31/23 64    PROVIDERS: Duanne Butler DASEN, MD is PCP  Rio Benton, NP is endocrinology provider - He is not followed routinely by cardiology.  He had an evaluation by Francyne Headland, MD in 2017.  See CV for below for summary of testing results.   LABS: For date of surgery as indicated.  Most recent results in [facility] include: Lab Results  Component Value Date   WBC 6.6 07/31/2023   HGB 11.0 (L) 07/31/2023   HCT 32.6 (L) 07/31/2023   PLT 270 07/31/2023   GLUCOSE 82 09/06/2023   CHOL 120 09/06/2023   TRIG 65 09/06/2023   HDL 45 09/06/2023   LDLDIRECT 120.2 04/04/2013   LDLCALC 61 09/06/2023   ALT 20 09/06/2023   AST 27 09/06/2023   NA 142 09/06/2023   K 4.6 09/06/2023   CL 106 09/06/2023   CREATININE 1.36 (H) 09/06/2023   BUN 21  09/06/2023   CO2 18 (L) 09/06/2023   TSH 0.919 09/06/2023   PSA 1.28 05/07/2020   HGBA1C 6.9 (H) 07/27/2023   MICROALBUR 150 10/20/2021     IMAGES: MRI left toes 09/15/2023: IMPRESSION: 1. Diffuse subcutaneous soft tissue swelling/edema consistent with cellulitis. No discrete fluid collection to suggest a drainable abscess. 2. Diffuse myofasciitis without findings for pyomyositis. 3. Osteomyelitis involving the distal phalanx of the second toe. 4. Surgical changes from recent partial great toe amputation. There is T1 and T2 signal abnormality along the osteotomy site of the proximal phalanx. This is most likely postoperative reactive changes but recommend clinical correlation and repeat imaging as indicated.   EKG: Last EKG noted was from 08/27/2021: Sinus rhythm Left atrial enlargement Otherwise within normal limits Confirmed by Garrick Charleston [phone]) on 08/27/2021 10:17:48 AM   CV: Stress Test 05/21/2015 There was no ST segment deviation noted during stress. Good exercise capacity 8 minutes No ischemic changes, low risk exercise treadmill test.   Echo 05/17/2015 Study Conclusions  - Left ventricle: The cavity size was normal. Wall thickness was    increased in a pattern of mild LVH. Systolic function was normal.    The estimated ejection fraction was in the range of 55% to 60%.    Wall motion was normal; there were no regional wall motion    abnormalities. Doppler parameters are  consistent with abnormal    left ventricular relaxation (grade 1 diastolic dysfunction).    Doppler parameters are consistent with high ventricular filling    pressure.  - Pulmonary arteries: Systolic pressure was mildly increased.  - Pericardium, extracardiac: A trivial pericardial effusion was    identified.   Impressions:   - Normal LV systolic function; mild LVH; grade 1 diastolic    dysfunction; trace MR; mild TR; mildly elevated pulmonary    pressure.   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   Lymphadenopathy    Peripheral neuropathy    PUD (peptic ulcer disease)    Rheumatoid arthritis(714.0) 09/22/2006    Past Surgical History:  Procedure Laterality Date   ACHILLES TENDON SURGERY Right 04/25/2019   Procedure: ACHILLES LENGTHENING;  Surgeon: Gershon Donnice SAUNDERS, DPM;  Location: [facility] ORS;  Service: Podiatry;  Laterality: Right;   AMPUTATION Right 04/25/2019   Procedure: AMPUTATION FOOT,TRANSMETATARSAL;  Surgeon: Gershon Donnice SAUNDERS, DPM;  Location: [facility]S;  Service: Podiatry;  Laterality: Right;   AMPUTATION TOE Left 07/30/2023   Procedure: AMPUTATION, TOE;  Surgeon: Malvin Marsa FALCON, DPM;  Location: [facility] OR;  Service: Orthopedics/Podiatry;  Laterality: Left;  LEFT GREAT TOE AMPUTAION   CATARACT EXTRACTION Bilateral    Dr. Lavonia   CATARACT EXTRACTION, BILATERAL     COLONOSCOPY  2004   LYMPH NODE BIOPSY Right 10/16/2014   Procedure: RIGHT INGUINAL LYMPH NODE BIOPSY;  Surgeon: Oneil Budge, MD;  Location: [facility] ORS;  Service: General;  Laterality: Right;    MEDICATIONS: No current facility-administered medications for this encounter.    aspirin  81 MG tablet   atorvastatin  (LIPITOR) 10 MG tablet   gabapentin  (NEURONTIN ) 300 MG capsule   gentamicin  ointment (GARAMYCIN ) 0.1 %   insulin  isophane & regular human KwikPen (NOVOLIN  70/30 KWIKPEN) (70-30) 100 UNIT/ML KwikPen   losartan  (COZAAR ) 50 MG tablet   Multiple Vitamins-Minerals  (MULTIVITAMIN MEN 50+) TABS   Tetrahydrozoline HCl (VISINE OP)   triamcinolone  cream (KENALOG ) 0.1 %   Accu-Chek Softclix Lancets lancets   glucose blood (ACCU-CHEK GUIDE TEST) test strip   INSULIN  SYRINGE 1CC/29G (DROPLET INSULIN  SYRINGE) 29G X 1/2 1 ML MISC     Isaiah Ruder, PA-C Surgical Short Stay/Anesthesiology [facility] Phone [phone] [facility] Phone [phone] 09/25/2023 1:59 PM

## 2023-09-26 ENCOUNTER — Other Ambulatory Visit (HOSPITAL_COMMUNITY): Payer: Self-pay

## 2023-09-26 ENCOUNTER — Encounter (HOSPITAL_COMMUNITY)
Admission: RE | Disposition: A | Discharge status: Home / Self Care | Payer: Self-pay | Source: Home / Self Care | Attending: Podiatry

## 2023-09-26 ENCOUNTER — Ambulatory Visit (HOSPITAL_COMMUNITY)
Admission: RE | Admit: 2023-09-26 | Discharge: 2023-09-26 | Disposition: A | Discharge status: Home / Self Care | Attending: Podiatry | Admitting: Podiatry

## 2023-09-26 ENCOUNTER — Ambulatory Visit (HOSPITAL_COMMUNITY): Payer: Self-pay | Admitting: Anesthesiology

## 2023-09-26 ENCOUNTER — Other Ambulatory Visit: Payer: Self-pay

## 2023-09-26 ENCOUNTER — Encounter (HOSPITAL_COMMUNITY): Payer: Self-pay | Admitting: Podiatry

## 2023-09-26 ENCOUNTER — Ambulatory Visit (HOSPITAL_BASED_OUTPATIENT_CLINIC_OR_DEPARTMENT_OTHER): Payer: Self-pay | Admitting: Anesthesiology

## 2023-09-26 DIAGNOSIS — Z79899 Other long term (current) drug therapy: Secondary | ICD-10-CM | POA: Diagnosis not present

## 2023-09-26 DIAGNOSIS — Z8249 Family history of ischemic heart disease and other diseases of the circulatory system: Secondary | ICD-10-CM | POA: Diagnosis not present

## 2023-09-26 DIAGNOSIS — G709 Myoneural disorder, unspecified: Secondary | ICD-10-CM | POA: Diagnosis not present

## 2023-09-26 DIAGNOSIS — I509 Heart failure, unspecified: Secondary | ICD-10-CM | POA: Diagnosis not present

## 2023-09-26 DIAGNOSIS — E1151 Type 2 diabetes mellitus with diabetic peripheral angiopathy without gangrene: Secondary | ICD-10-CM | POA: Insufficient documentation

## 2023-09-26 DIAGNOSIS — E1169 Type 2 diabetes mellitus with other specified complication: Secondary | ICD-10-CM

## 2023-09-26 DIAGNOSIS — Z8711 Personal history of peptic ulcer disease: Secondary | ICD-10-CM | POA: Insufficient documentation

## 2023-09-26 DIAGNOSIS — M7989 Other specified soft tissue disorders: Secondary | ICD-10-CM | POA: Diagnosis not present

## 2023-09-26 DIAGNOSIS — Z833 Family history of diabetes mellitus: Secondary | ICD-10-CM | POA: Insufficient documentation

## 2023-09-26 DIAGNOSIS — Z87891 Personal history of nicotine dependence: Secondary | ICD-10-CM | POA: Diagnosis not present

## 2023-09-26 DIAGNOSIS — M1990 Unspecified osteoarthritis, unspecified site: Secondary | ICD-10-CM | POA: Insufficient documentation

## 2023-09-26 DIAGNOSIS — I480 Paroxysmal atrial fibrillation: Secondary | ICD-10-CM | POA: Diagnosis not present

## 2023-09-26 DIAGNOSIS — M86672 Other chronic osteomyelitis, left ankle and foot: Secondary | ICD-10-CM | POA: Insufficient documentation

## 2023-09-26 DIAGNOSIS — N189 Chronic kidney disease, unspecified: Secondary | ICD-10-CM | POA: Insufficient documentation

## 2023-09-26 DIAGNOSIS — I130 Hypertensive heart and chronic kidney disease with heart failure and stage 1 through stage 4 chronic kidney disease, or unspecified chronic kidney disease: Secondary | ICD-10-CM | POA: Diagnosis not present

## 2023-09-26 DIAGNOSIS — M069 Rheumatoid arthritis, unspecified: Secondary | ICD-10-CM | POA: Diagnosis not present

## 2023-09-26 DIAGNOSIS — M86172 Other acute osteomyelitis, left ankle and foot: Secondary | ICD-10-CM

## 2023-09-26 DIAGNOSIS — Z794 Long term (current) use of insulin: Secondary | ICD-10-CM

## 2023-09-26 DIAGNOSIS — M86679 Other chronic osteomyelitis, unspecified ankle and foot: Secondary | ICD-10-CM | POA: Diagnosis present

## 2023-09-26 DIAGNOSIS — H409 Unspecified glaucoma: Secondary | ICD-10-CM | POA: Diagnosis not present

## 2023-09-26 DIAGNOSIS — E785 Hyperlipidemia, unspecified: Secondary | ICD-10-CM | POA: Insufficient documentation

## 2023-09-26 DIAGNOSIS — E7800 Pure hypercholesterolemia, unspecified: Secondary | ICD-10-CM | POA: Diagnosis not present

## 2023-09-26 DIAGNOSIS — I10 Essential (primary) hypertension: Secondary | ICD-10-CM | POA: Diagnosis not present

## 2023-09-26 DIAGNOSIS — E1122 Type 2 diabetes mellitus with diabetic chronic kidney disease: Secondary | ICD-10-CM | POA: Insufficient documentation

## 2023-09-26 DIAGNOSIS — K219 Gastro-esophageal reflux disease without esophagitis: Secondary | ICD-10-CM | POA: Insufficient documentation

## 2023-09-26 HISTORY — PX: AMPUTATION TOE: SHX6595

## 2023-09-26 LAB — GLUCOSE, CAPILLARY
Glucose-Capillary: 122 mg/dL — ABNORMAL HIGH (ref 70–99)
Glucose-Capillary: 121 mg/dL — ABNORMAL HIGH (ref 70–99)
Glucose-Capillary: 112 mg/dL — ABNORMAL HIGH (ref 70–99)

## 2023-09-26 SURGERY — AMPUTATION, TOE
Anesthesia: Monitor Anesthesia Care | Site: Toe | Laterality: Left

## 2023-09-26 MED ORDER — ORAL CARE MOUTH RINSE: 15.0000 mL | Freq: Once | OROMUCOSAL | Status: AC

## 2023-09-26 MED ORDER — LACTATED RINGERS IV SOLN: INTRAVENOUS | Status: DC

## 2023-09-26 MED ORDER — CEFAZOLIN SODIUM-DEXTROSE 2-4 GM/100ML-% IV SOLN
2.0000 g | Freq: Once | INTRAVENOUS | Status: AC
  Administered 2023-09-26: 2 g via INTRAVENOUS

## 2023-09-26 MED ORDER — LIDOCAINE 2% (20 MG/ML) 5 ML SYRINGE
INTRAMUSCULAR | Status: DC | PRN
  Administered 2023-09-26: 60 mg via INTRAVENOUS

## 2023-09-26 MED ORDER — OXYCODONE HCL 5 MG PO TABS: 5.0000 mg | ORAL_TABLET | Freq: Once | ORAL | Status: DC | PRN

## 2023-09-26 MED ORDER — CHLORHEXIDINE GLUCONATE 0.12 % MT SOLN
15.0000 mL | Freq: Once | OROMUCOSAL | Status: AC
  Administered 2023-09-26: 15 mL via OROMUCOSAL
  Filled 2023-09-26: qty 15

## 2023-09-26 MED ORDER — FENTANYL CITRATE (PF) 100 MCG/2ML IJ SOLN: 25.0000 ug | INTRAMUSCULAR | Status: DC | PRN

## 2023-09-26 MED ORDER — FENTANYL CITRATE (PF) 250 MCG/5ML IJ SOLN
INTRAMUSCULAR | Status: AC
  Filled 2023-09-26: qty 5

## 2023-09-26 MED ORDER — 0.9 % SODIUM CHLORIDE (POUR BTL) OPTIME
TOPICAL | Status: DC | PRN
  Administered 2023-09-26: 1000 mL

## 2023-09-26 MED ORDER — MIDAZOLAM HCL 2 MG/2ML IJ SOLN
INTRAMUSCULAR | Status: DC | PRN
  Administered 2023-09-26: 2 mg via INTRAVENOUS

## 2023-09-26 MED ORDER — EPHEDRINE SULFATE-NACL 50-0.9 MG/10ML-% IV SOSY
PREFILLED_SYRINGE | INTRAVENOUS | Status: DC | PRN
  Administered 2023-09-26: 50 mg via INTRAVENOUS

## 2023-09-26 MED ORDER — LIDOCAINE HCL (PF) 1 % IJ SOLN
INTRAMUSCULAR | Status: AC
  Filled 2023-09-26: qty 30

## 2023-09-26 MED ORDER — INSULIN ASPART 100 UNIT/ML IJ SOLN: 0.0000 [IU] | INTRAMUSCULAR | Status: DC | PRN

## 2023-09-26 MED ORDER — OXYCODONE HCL 5 MG/5ML PO SOLN: 5.0000 mg | Freq: Once | ORAL | Status: DC | PRN

## 2023-09-26 MED ORDER — MIDAZOLAM HCL 2 MG/2ML IJ SOLN
INTRAMUSCULAR | Status: AC
  Filled 2023-09-26: qty 2

## 2023-09-26 MED ORDER — BUPIVACAINE HCL (PF) 0.5 % IJ SOLN
INTRAMUSCULAR | Status: AC
  Filled 2023-09-26: qty 30

## 2023-09-26 MED ORDER — LIDOCAINE HCL 1 % IJ SOLN
INTRAMUSCULAR | Status: DC | PRN
  Administered 2023-09-26: 10 mL

## 2023-09-26 MED ORDER — OXYCODONE-ACETAMINOPHEN 5-325 MG PO TABS
1.0000 | ORAL_TABLET | ORAL | 0 refills | Status: DC | PRN
  Filled 2023-09-26: qty 20, 4d supply, fill #0

## 2023-09-26 MED ORDER — ONDANSETRON HCL 4 MG/2ML IJ SOLN: 4.0000 mg | Freq: Once | INTRAMUSCULAR | Status: DC | PRN

## 2023-09-26 MED ORDER — DOXYCYCLINE HYCLATE 100 MG PO TABS
100.0000 mg | ORAL_TABLET | Freq: Two times a day (BID) | ORAL | 0 refills | Status: AC
  Filled 2023-09-26: qty 10, 5d supply, fill #0

## 2023-09-26 MED ORDER — PROPOFOL 10 MG/ML IV BOLUS
INTRAVENOUS | Status: DC | PRN
  Administered 2023-09-26: 125 ug/kg/min via INTRAVENOUS
  Administered 2023-09-26: 50 mg via INTRAVENOUS

## 2023-09-26 SURGICAL SUPPLY — 37 items
BLADE AVERAGE 25X9 (BLADE) IMPLANT
BLADE SURG 10 STRL SS (BLADE) ×2 IMPLANT
BLADE SURG 15 STRL LF DISP TIS (BLADE) ×2 IMPLANT
BNDG COHESIVE 3X5 TAN ST LF (GAUZE/BANDAGES/DRESSINGS) ×2 IMPLANT
BNDG COMPR ESMARK 4X3 LF (GAUZE/BANDAGES/DRESSINGS) ×2 IMPLANT
BNDG ELASTIC 3INX 5YD STR LF (GAUZE/BANDAGES/DRESSINGS) ×2 IMPLANT
BNDG ELASTIC 4INX 5YD STR LF (GAUZE/BANDAGES/DRESSINGS) IMPLANT
BNDG GAUZE DERMACEA FLUFF 4 (GAUZE/BANDAGES/DRESSINGS) IMPLANT
CHLORAPREP W/TINT 26 (MISCELLANEOUS) IMPLANT
CNTNR URN SCR LID CUP LEK RST (MISCELLANEOUS) IMPLANT
DRSG ADAPTIC 3X8 NADH LF (GAUZE/BANDAGES/DRESSINGS) IMPLANT
DRSG XEROFORM 1X8 (GAUZE/BANDAGES/DRESSINGS) IMPLANT
ELECTRODE REM PT RTRN 9FT ADLT (ELECTROSURGICAL) ×2 IMPLANT
GAUZE PAD ABD 8X10 STRL (GAUZE/BANDAGES/DRESSINGS) IMPLANT
GAUZE SPONGE 2X2 STRL 8-PLY (GAUZE/BANDAGES/DRESSINGS) IMPLANT
GAUZE SPONGE 4X4 12PLY STRL (GAUZE/BANDAGES/DRESSINGS) ×2 IMPLANT
GAUZE STRETCH 2X75IN STRL (MISCELLANEOUS) ×2 IMPLANT
GAUZE XEROFORM 1X8 LF (GAUZE/BANDAGES/DRESSINGS) ×2 IMPLANT
GLOVE BIO SURGEON STRL SZ7.5 (GLOVE) ×2 IMPLANT
GLOVE BIOGEL PI IND STRL 7.5 (GLOVE) ×2 IMPLANT
GOWN STRL REUS W/ TWL LRG LVL3 (GOWN DISPOSABLE) ×4 IMPLANT
KIT BASIN OR (CUSTOM PROCEDURE TRAY) ×2 IMPLANT
NDL HYPO 25X1 1.5 SAFETY (NEEDLE) ×2 IMPLANT
NEEDLE HYPO 25X1 1.5 SAFETY (NEEDLE) ×1 IMPLANT
PACK ORTHO EXTREMITY (CUSTOM PROCEDURE TRAY) ×2 IMPLANT
PADDING CAST ABS COTTON 4X4 ST (CAST SUPPLIES) ×4 IMPLANT
SET HNDPC FAN SPRY TIP SCT (DISPOSABLE) IMPLANT
SPIKE FLUID TRANSFER (MISCELLANEOUS) IMPLANT
STOCKINETTE 4X48 STRL (DRAPES) IMPLANT
SUT ETHILON 3 0 FSLX (SUTURE) IMPLANT
SUT PROLENE 3 0 PS 2 (SUTURE) IMPLANT
SUT PROLENE 4 0 PS 2 18 (SUTURE) IMPLANT
SYR CONTROL 10ML LL (SYRINGE) ×2 IMPLANT
TUBE CONNECTING 12X1/4 (SUCTIONS) IMPLANT
UNDERPAD 30X36 HEAVY ABSORB (UNDERPADS AND DIAPERS) ×2 IMPLANT
WATER STERILE IRR 1000ML POUR (IV SOLUTION) ×2 IMPLANT
YANKAUER SUCT BULB TIP NO VENT (SUCTIONS) IMPLANT

## 2023-09-26 NOTE — H&P (Signed)
 SURGICAL HISTORY and PHYSICAL FOOT & ANKLE SURGERY    Date Time: 09/26/23 11:52 AM Physician: Malvin   History of Presenting Illness: Aaron Fox is a 68 y.o. year old male presenting to [facility] hospital for surgery on left foot today. The patient complains of wound on left 2nd toe that has been present for weeks, but that has progressively worsened during the past 1-2 weeks. Conservative management was attempted including wound care, but was ultimately unsuccessful. MRI obtained concern for OM of the L 2nd toe distal phalanx. The patient has elected to pursue surgical management and understands the procedure, benefits, risks/complications, and alternatives to surgery. He has remained npo since MN. Otherwise feeling well today, no other complaints. Denies fever, chills, nausea, emesis, dyspnea, and chest pain.  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   Lymphadenopathy    Peripheral neuropathy    PUD (peptic ulcer disease)    Rheumatoid arthritis(714.0) 09/22/2006    Past Surgical History: Past Surgical History:  Procedure Laterality Date   ACHILLES TENDON SURGERY Right 04/25/2019   Procedure: ACHILLES LENGTHENING;  Surgeon: Gershon Donnice SAUNDERS, DPM;  [facility];  Service: Podiatry;  Laterality: Right;   AMPUTATION Right 04/25/2019   Procedure: AMPUTATION FOOT,TRANSMETATARSAL;  Surgeon: Gershon Donnice SAUNDERS, DPM;  Location: [facility];  Service: Podiatry;  Laterality: Right;   AMPUTATION TOE Left 07/30/2023   Procedure: AMPUTATION, TOE;  Surgeon: Malvin Marsa FALCON, DPM;  Location: [facility];  Service: Orthopedics/Podiatry;  Laterality: Left;  LEFT GREAT TOE AMPUTAION   CATARACT EXTRACTION Bilateral    Dr. Lavonia   CATARACT EXTRACTION, BILATERAL     COLONOSCOPY  2004   LYMPH NODE BIOPSY Right 10/16/2014   Procedure: RIGHT INGUINAL LYMPH NODE BIOPSY;  Surgeon: Oneil Budge, MD;  Location: [facility];  Service: General;  Laterality: Right;    Allergies: Allergies  Allergen Reactions   Penicillins Swelling and Other (See Comments)    Swelling of hands and feet Skin peeling   Lamisil [Terbinafine] Hives   Zestril [Lisinopril] Cough   Cipro  [Ciprofloxacin  Hcl] Hives and Rash    Home Medications: Current Facility-Administered Medications  Medication Dose Route Frequency Provider Last Rate Last Admin   insulin  aspart (novoLOG ) injection 0-14 Units  0-14 Units Subcutaneous Q2H PRN Jerrye Sharper, MD       lactated ringers  infusion   Intravenous Continuous Jerrye Sharper, MD 10 mL/hr at 09/26/23 1004 New Bag at 09/26/23 1004   Medications Prior to Admission  Medication Sig Dispense Refill Last Dose/Taking   aspirin  81 MG tablet Take 81 mg by mouth in the morning.   09/25/2023   atorvastatin  (LIPITOR) 10 MG tablet TAKE 1 TABLET EVERY DAY 90 tablet 3 09/25/2023   gabapentin  (NEURONTIN ) 300 MG capsule TAKE 2 CAPSULES THREE TIMES DAILY 540 capsule 2 09/25/2023   gentamicin  ointment (GARAMYCIN ) 0.1 %  Apply 1 Application topically daily. Apply once daily to left 2nd toe ulcer 15 g 0 09/25/2023   insulin  isophane & regular human KwikPen (NOVOLIN  70/30 KWIKPEN) (70-30) 100 UNIT/ML KwikPen Inject 10 Units into the skin 2 (two) times daily before a meal. 15 mL 1 09/25/2023   losartan  (COZAAR ) 50 MG tablet TAKE 1 TABLET EVERY DAY 90 tablet 10 09/25/2023   Multiple Vitamins-Minerals (MULTIVITAMIN MEN 50+) TABS Take 1 tablet by mouth  daily. Men's one a Day   09/25/2023   Tetrahydrozoline HCl (VISINE OP) Place 1 drop into both eyes daily as needed (redness).   Past Month   triamcinolone  cream (KENALOG ) 0.1 % APPLY TO THE AFFECTED AREA(S) TOPICALLY THREE TIMES DAILY AS NEEDED FOR RASH 45 g 3 Past Week   Accu-Chek Softclix Lancets lancets TEST BLOOD SUGAR 7 TIMES DAILY 600 each 3    glucose blood (ACCU-CHEK GUIDE TEST) test strip Use as instructed to monitor glucose 4 times daily 300 each 6    INSULIN  SYRINGE 1CC/29G (DROPLET INSULIN  SYRINGE) 29G X 1/2 1 ML MISC Use to inject in morning and bedtime 200 each 1      Social History: @IPSOCHX @  Family History: Family History  Problem Relation Age of Onset   Heart disease Other        FH of CAD   Colon polyps Paternal Uncle    Kidney disease Paternal Uncle    Diabetes Paternal Grandfather    Diabetes Maternal Uncle    Cancer Neg Hx    Colon cancer Neg Hx    Gallbladder disease Neg Hx    Esophageal cancer Neg Hx    Rectal cancer Neg Hx    Stomach cancer Neg Hx     Review of Systems: As per HPI. A complete 10 point review of systems was performed and all other systems reviewed were negative.  Physical Exam: Vitals:   09/26/23 0943  BP: 128/70  Pulse: 63  Resp: 19  Temp: 98 F (36.7 C)  SpO2: 100%    Lower Extremity Physical Exam  Vasc: DP and PT pulses palpable Neuro: Sensation diminished to L 2nd toe MSK: edea of left foot Derm: left 2nd toe with dressing, distal L 2nd toe ulceration Lungs: Unlabored, no cough/wheezing Heart: RRR    Labs:  - Reviewed prior to examination  Radiology:  - Reviewed prior to examination  Assessment: Aaron Fox is 68 y.o. male with left 2nd toe distal phalanx osteomyelitis   Plan: - Will proceed with surgical management L 2nd toe partial amputation. - Procedure, benefits, risks, and alternatives discussed with patient and informed consent obtained. - Prophylactic antibiotics: 2 gm ancef . - Surgical site  marked.  Marsa FALCON Naviyah Schaffert

## 2023-09-26 NOTE — Transfer of Care (Signed)
 Immediate Anesthesia Transfer of Care Note  Patient: Quinterrius K Lafevers  Procedure(s) Performed: AMPUTATION, TOE (Left: Toe)  Patient Location: PACU  Anesthesia Type:MAC  Level of Consciousness: awake, alert , and sedated  Airway & Oxygen Therapy: Patient Spontanous Breathing and Patient connected to nasal cannula oxygen  Post-op Assessment: Report given to RN and Post -op Vital signs reviewed and stable  Post vital signs: Reviewed and stable  Last Vitals:  Vitals Value Taken Time  BP 114/61 09/26/23 12:45  Temp 36.4 C 09/26/23 12:43  Pulse 68 09/26/23 12:45  Resp 17 09/26/23 12:45  SpO2 98 % 09/26/23 12:45  Vitals shown include unfiled device data.  Last Pain:  Vitals:   09/26/23 1243  TempSrc:   PainSc: 0-No pain         Complications: No notable events documented.

## 2023-09-26 NOTE — Anesthesia Postprocedure Evaluation (Signed)
 Anesthesia Post Note  Patient: Odysseus K Russey  Procedure(s) Performed: AMPUTATION,  SECOND TOE (Left: Toe)     Patient location during evaluation: PACU Anesthesia Type: MAC Level of consciousness: awake and alert and oriented Pain management: pain level controlled Vital Signs Assessment: post-procedure vital signs reviewed and stable Respiratory status: spontaneous breathing, nonlabored ventilation and respiratory function stable Cardiovascular status: stable and blood pressure returned to baseline Postop Assessment: no apparent nausea or vomiting Anesthetic complications: no   No notable events documented.  Last Vitals:  Vitals:   09/26/23 1245 09/26/23 1300  BP: 114/61 116/64  Pulse: 68 (!) 59  Resp: 19 14  Temp:  36.4 C  SpO2: 98% 97%    Last Pain:  Vitals:   09/26/23 1243  TempSrc:   PainSc: 0-No pain                 Leeza Heiner A.

## 2023-09-26 NOTE — Op Note (Signed)
 Full Operative Report  Date of Operation: 11:57 AM, 09/26/2023   Patient: Aaron Fox - 68 y.o. male  Surgeon: Malvin Marsa FALCON, DPM   Assistant: None  Diagnosis: OSTEOMYELITIS LEFT FOOT 2nd toe  Procedure:  1. Amputation of 2nd toe left foot through proximal phalanx head    Anesthesia: Monitor Anesthesia Care  Jerrye Sharper, MD  Anesthesiologist: Jerrye Sharper, MD CRNA: Dorma Jonny BROCKS, CRNA   Estimated Blood Loss: Minimal   Hemostasis: 1) Anatomical dissection, mechanical compression, electrocautery 2) No tourniquet was used   Implants: * No implants in log *  Materials: Prolene 3-0   Injectables: 1) Pre-operatively: 10 cc of 50:50 mixture 1%lidocaine  plain and 0.5% marcaine  plain 2) Post-operatively: None   Specimens: - Pathology: Left 2nd toe for pathology - Microbiology: None   Antibiotics: 2 gm ancef  given pre op Drains: None  Complications: Patient tolerated the procedure well without complication.   Operative findings: As below in detailed report  Indications for Procedure: [name] presents to Malvin Marsa FALCON, DPM with a chief complaint of ulceration distal L 2nd toe with underlying osteomyelitis of the distal phalanx. The patient has failed conservative treatments of various modalities. At this time the patient has elected to proceed with surgical correction. All alternatives, risks, and complications of the procedures were thoroughly explained to the patient. Patient exhibits appropriate understanding of all discussion points and informed consent was signed and obtained in the chart with no guarantees to surgical outcome given or implied.  Description of Procedure: Patient was brought to the operating room. Patient remained on their hospital bed in the supine position. A surgical timeout was performed and all members of the operating room, the procedure, and the surgical site were identified. anesthesia occurred as per  anesthesia record. Local anesthetic as previously described was then injected about the operative field in a local infiltrative block.  The operative lower extremity as noted above was then prepped and draped in the usual sterile manner. The following procedure then began.  Attention was directed to the 2nd digit on the LEFT foot. A full-thickness incision encompassing the entire digit was made using a #15 blade. Dissection was carried down to bone. The toe was secured with a towel clamp, further dissected in its entirety, and disarticulated at the PIPJ and passed to the back table as a gross specimen. This was then labled and sent to pathology. The bone was noted to be soft and eroded, and consistent with osteomyelitis. All remaining necrotic and devitalized soft tissue structures were visualized and dissected away using sharp and dull dissection. A bone cutting forceps was used to resect the head of the proximal phalanx for closure purposes. Care was taken to protect all neurovascular structures throughout the dissection. All bleeders were cauterized as necessary.   The area was then flushed with copious amounts of sterile saline. Then using the suture materials previously described, the site was closed in anatomic layers and the skin was well approximated under minimal tension.   The surgical site was then dressed with xeroform 4x4 kerlix and ace wrap. The patient tolerated both the procedure and anesthesia well with vital signs stable throughout. The patient was transferred in good condition and all vital signs stable  from the OR to recovery under the discretion of anesthesia.  Condition: Vital signs stable, neurovascular status unchanged from preoperative   Surgical plan:  Expect clean margin, will plan for 5 days po abx. WBAT in post op shoe. Leave dressing c/d/I until  follow up.  The patient will be WBAT in a post op shoe to the operative limb until further instructed. The dressing is to remain  clean, dry, and intact. Will continue to follow unless noted elsewhere.   Marsa Honour, DPM [facility]

## 2023-09-26 NOTE — H&P (Signed)
 Anesthesia H&P Update: History and Physical Exam reviewed; patient is OK for planned anesthetic and procedure. ? ?

## 2023-09-26 NOTE — Discharge Instructions (Signed)
Foot & Ankle Surgery Patient Discharge Instructions:   Arrange to have an adult drive you home after surgery. If you had general anesthesia, it may take a day or more to fully recover. So, for at least the next 24 hours: Do not drive or use machinery or power tools; do not drink alcohol; and do not make any major decisions.   Bandages: Keep your dressings clean, dry, and intact. Do not remove or change. When bathing/showering, cover the bandage or cast with a plastic bag to keep it dry (still keep the area away from the water) and use a chair, do not stand. Don't remove your bandage until your doctor tells you to. If your bandage gets wet or dirty, check with your doctor. You can likely replace it with a clean, dry one.  Activity: Weightbearing as tolerated in post op shoe Sit or lie down when possible. Put a pillow under your heel to raise your foot above the level of your heart. Place ice bag or pack behind knee on surgical side for 20 minutes every hour that you are awake for the first 24-48 hours. You can drive again when instructed by your doctor. Wear your surgical shoe at all times unless told otherwise by your health care provider. Use crutches or a cane as directed. Follow your doctor's instructions about putting weight on your foot.  Diet: Start with liquids and light foods (such as dry toast, bananas, and applesauce). As you feel up to it, slowly return to your normal diet. Drink at least six to eight glasses of water or other nonalcoholic fluids a day. To avoid nausea, eat before taking narcotic pain medications.  Medications: Take all medications as instructed. Take pain medications on time. Do not wait until the pain is bad before taking your medications. Avoid alcohol while on pain medications.  Call your doctor if: Continuous bleeding through dressings. If your dressings get wet. Fevers over 100.4 degrees Fahrenheit (38 degrees Celsius). Chest pains or difficulty  breathing.

## 2023-09-27 ENCOUNTER — Encounter (HOSPITAL_COMMUNITY): Payer: Self-pay | Admitting: Podiatry

## 2023-09-28 LAB — SURGICAL PATHOLOGY, GROSS ONLY (NOT ARMC)

## 2023-09-29 ENCOUNTER — Other Ambulatory Visit

## 2023-10-02 ENCOUNTER — Ambulatory Visit (INDEPENDENT_AMBULATORY_CARE_PROVIDER_SITE_OTHER): Admitting: Podiatry

## 2023-10-02 DIAGNOSIS — M86172 Other acute osteomyelitis, left ankle and foot: Secondary | ICD-10-CM | POA: Diagnosis not present

## 2023-10-02 DIAGNOSIS — Z9889 Other specified postprocedural states: Secondary | ICD-10-CM

## 2023-10-02 NOTE — Progress Notes (Signed)
  Subjective:  Patient ID: Aaron Fox, male    DOB: 11-10-1955,  MRN: [id number]  Chief Complaint  Patient presents with   Post-op Follow-up    Left 2nd toe amp. Sutures intact, no dehiscence. Denies pain, denies fever, denies nausea.     DOS: 09/26/2023 Procedure: Amputation of left second toe through proximal phalanx head.  68 y.o. male seen for post op check.  Patient reports he is doing well he has been walking in postop shoe finished course of antibiotics no concerns.  Review of Systems: Negative except as noted in the HPI. Denies N/V/F/Ch.   Objective:   Constitutional Well developed. Well nourished.  Vascular Foot warm and well perfused. Capillary refill normal to all digits.   No calf pain with palpation  Neurologic Normal speech. Oriented to person, place, and time. Epicritic sensation diminished  Dermatologic Amputation site healing very well no dehiscence no necrosis or eschar along the amputation line no drainage   Orthopedic: Status post partial left second toe amputation through proximal phalanx head, prior hallux amputation   Radiographs: Deferred  Pathology: A. TOE, LEFT SECOND, AMPUTATION:  - Soft tissue ulceration and necrosis with underlying acute  osteomyelitis.  - Proximal resection margin with cut surface of bone; negative for osteomyelitis.   Micro: Deferred, surgical cure  Assessment:   1. Acute osteomyelitis of toe, left (HCC)   2. Post-operative state   Osteomyelitis of left second toe status post partial left second toe amputation through proximal phalanx head  Plan:  Patient was evaluated and treated and all questions answered.  POD # 6 s/p partial left second toe amputation through proximal phalanx at -Progressing as expected postoperatively, amputation site healing very well no dehiscence or residual infection noted -XR: Deferred -WB Status: Weightbearing as tolerated in postop shoe -Sutures: Remain intact until next  appointment. -Medications/ABX: Status post course of antibiotics no further indicated -Dressing: Replaced a new Xeroform 4 x 4 gauze Kerlix Ace roll dressing leave this intact until next appointment - F/u Plan: 1 week for suture removal        Aaron Fox, DPM [facility] / [facility]

## 2023-10-04 ENCOUNTER — Ambulatory Visit: Admitting: Podiatry

## 2023-10-05 ENCOUNTER — Encounter: Admitting: Family Medicine

## 2023-10-11 ENCOUNTER — Ambulatory Visit (INDEPENDENT_AMBULATORY_CARE_PROVIDER_SITE_OTHER): Admitting: Podiatry

## 2023-10-11 DIAGNOSIS — M86172 Other acute osteomyelitis, left ankle and foot: Secondary | ICD-10-CM

## 2023-10-11 DIAGNOSIS — Z9889 Other specified postprocedural states: Secondary | ICD-10-CM

## 2023-10-11 NOTE — Progress Notes (Signed)
  Subjective:  Patient ID: Aaron Fox, male    DOB: 12/17/1955,  MRN: [id number]  Chief Complaint  Patient presents with   Post-op Follow-up    Left foot, 2nd toe amp. Sutures intact. Surgical incision intact. No drainage, denies pain, swelling at times. Wearing surgical shoe today.     DOS: 09/26/2023 Procedure: Amputation of left second toe through proximal phalanx head.  68 y.o. male seen for post op check.  Patient reports he is doing well he has been walking in postop shoe finished course of antibiotics no concerns.  Denies pain  Review of Systems: Negative except as noted in the HPI. Denies N/V/F/Ch.   Objective:   Constitutional Well developed. Well nourished.  Vascular Foot warm and well perfused. Capillary refill normal to all digits.   No calf pain with palpation  Neurologic Normal speech. Oriented to person, place, and time. Epicritic sensation diminished  Dermatologic Amputation site healing very well no dehiscence no necrosis or eschar along the amputation line no drainage     Orthopedic: Status post partial left second toe amputation through proximal phalanx head, prior hallux amputation   Radiographs: Deferred  Pathology: A. TOE, LEFT SECOND, AMPUTATION:  - Soft tissue ulceration and necrosis with underlying acute  osteomyelitis.  - Proximal resection margin with cut surface of bone; negative for osteomyelitis.   Micro: Deferred, surgical cure  Assessment:   1. Acute osteomyelitis of toe, left (HCC)   2. Post-operative state    Osteomyelitis of left second toe status post partial left second toe amputation through proximal phalanx head  Plan:  Patient was evaluated and treated and all questions answered.  2 weeks s/p partial left second toe amputation through proximal phalanx at -Progressing as expected postoperatively, amputation site healing very well no dehiscence or residual infection noted -XR: Deferred -WB Status: Weightbearing as  tolerated in postop shoe, okay to transition back to regular shoe gear -Sutures: Removed today in toto -Medications/ABX: Status post course of antibiotics no further indicated -Dressing: Can apply dry Band-Aid following washing with warm soapy water. - F/u Plan: Follow-up in 2 weeks for final recheck, will likely proceed with appointment with our pedorthist for custom inserts at that time        Aaron Fox, DPM [facility] / [facility]

## 2023-10-19 ENCOUNTER — Ambulatory Visit: Admitting: Family Medicine

## 2023-10-19 ENCOUNTER — Encounter: Payer: Self-pay | Admitting: Family Medicine

## 2023-10-19 ENCOUNTER — Other Ambulatory Visit: Payer: Self-pay | Admitting: Family Medicine

## 2023-10-19 VITALS — BP 126/72 | HR 57 | Temp 98.0°F | Ht 71.5 in | Wt 218.0 lb

## 2023-10-19 DIAGNOSIS — Z125 Encounter for screening for malignant neoplasm of prostate: Secondary | ICD-10-CM

## 2023-10-19 DIAGNOSIS — Z0001 Encounter for general adult medical examination with abnormal findings: Secondary | ICD-10-CM | POA: Diagnosis not present

## 2023-10-19 DIAGNOSIS — Z0000 Encounter for general adult medical examination without abnormal findings: Secondary | ICD-10-CM

## 2023-10-19 DIAGNOSIS — Z794 Long term (current) use of insulin: Secondary | ICD-10-CM | POA: Diagnosis not present

## 2023-10-19 DIAGNOSIS — Z23 Encounter for immunization: Secondary | ICD-10-CM | POA: Diagnosis not present

## 2023-10-19 DIAGNOSIS — Z1211 Encounter for screening for malignant neoplasm of colon: Secondary | ICD-10-CM

## 2023-10-19 DIAGNOSIS — E7800 Pure hypercholesterolemia, unspecified: Secondary | ICD-10-CM

## 2023-10-19 DIAGNOSIS — E11319 Type 2 diabetes mellitus with unspecified diabetic retinopathy without macular edema: Secondary | ICD-10-CM

## 2023-10-19 DIAGNOSIS — Z8739 Personal history of other diseases of the musculoskeletal system and connective tissue: Secondary | ICD-10-CM

## 2023-10-19 NOTE — Progress Notes (Signed)
 Subjective:    Patient ID: Aaron Fox, male    DOB: [birth year], 68 y.o.   MRN: [id number]  HPI  Patient is a very pleasant 68 year old African-American gentleman here today for a complete physical exam.  Unfortunately, he recently had surgery due to osteomyelitis in his left second toe.  He had to have a portion of his left second toe removed.  He has had a portion of his left great toe removed previously for osteomyelitis.  He has a longstanding history of diabetes and also diabetic neuropathy.  He has very diminished sensation in both feet.  However he is trying to check his feet every night for any wounds or sores.  He is following up regularly with his podiatrist.  His last hemoglobin A1c was 6.9 in May.  His blood pressure today is outstanding at 126/72.  He has his diabetic eye exam scheduled.  His colonoscopy was last in 2020.  Due to a previous history of polyps they recommended a repeat colonoscopy in 5 years.  Therefore this is due.  He is also due for a PSA.  His shingles vaccine is up-to-date. 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   Lymphadenopathy    Peripheral neuropathy    PUD (peptic ulcer disease)    Rheumatoid arthritis(714.0) 09/22/2006   Past Surgical History:  Procedure Laterality Date   ACHILLES TENDON SURGERY Right 04/25/2019   Procedure: ACHILLES LENGTHENING;  Surgeon: Gershon Donnice SAUNDERS, DPM;  Location: [facility];  Service:  Podiatry;  Laterality: Right;   AMPUTATION Right 04/25/2019   Procedure: AMPUTATION FOOT,TRANSMETATARSAL;  Surgeon: Gershon Donnice SAUNDERS, DPM;  Location: [facility];  Service: Podiatry;  Laterality: Right;   AMPUTATION TOE Left 07/30/2023   Procedure: AMPUTATION, TOE;  Surgeon: Malvin Marsa FALCON, DPM;  Location: [facility];  Service: Orthopedics/Podiatry;  Laterality: Left;  LEFT GREAT TOE AMPUTAION   AMPUTATION TOE Left 09/26/2023   Procedure: AMPUTATION,  SECOND TOE;  Surgeon: Malvin Marsa FALCON, DPM;  Location: [facility];  Service: Orthopedics/Podiatry;  Laterality: Left;  2nd toe   CATARACT EXTRACTION Bilateral    Dr. Lavonia   CATARACT EXTRACTION, BILATERAL     COLONOSCOPY  2004   LYMPH NODE BIOPSY Right 10/16/2014   Procedure: RIGHT INGUINAL LYMPH NODE BIOPSY;  Surgeon: Oneil Budge, MD;  Location: [facility];  Service: General;  Laterality: Right;   Current Outpatient Medications on File Prior to Visit  Medication Sig Dispense Refill   Accu-Chek Softclix Lancets lancets TEST BLOOD SUGAR 7 TIMES DAILY 600 each 3   aspirin  81 MG tablet Take 81 mg by mouth in the morning.     atorvastatin  (LIPITOR) 10 MG tablet TAKE 1 TABLET EVERY DAY 90 tablet 3   gabapentin  (NEURONTIN ) 300 MG capsule TAKE 2 CAPSULES THREE TIMES DAILY 540 capsule 2   gentamicin  ointment (GARAMYCIN ) 0.1 % Apply 1 Application  topically daily. Apply once daily to left 2nd toe ulcer 15 g 0   glucose blood (ACCU-CHEK GUIDE TEST) test strip Use as instructed to monitor glucose 4 times daily 300 each 6   insulin  isophane & regular human KwikPen (NOVOLIN  70/30 KWIKPEN) (70-30) 100 UNIT/ML KwikPen Inject 10 Units into the skin 2 (two) times daily before a meal. 15 mL 1   INSULIN  SYRINGE 1CC/29G (DROPLET INSULIN  SYRINGE) 29G X 1/2 1 ML MISC Use to inject in morning and bedtime 200 each 1   losartan  (COZAAR ) 50 MG tablet TAKE 1 TABLET EVERY DAY 90 tablet 10   Multiple Vitamins-Minerals (MULTIVITAMIN MEN 50+) TABS Take 1 tablet by mouth daily.  Men's one a Day     Tetrahydrozoline HCl (VISINE OP) Place 1 drop into both eyes daily as needed (redness).     triamcinolone  cream (KENALOG ) 0.1 % APPLY TO THE AFFECTED AREA(S) TOPICALLY THREE TIMES DAILY AS NEEDED FOR RASH 45 g 3   No current facility-administered medications on file prior to visit.   Allergies  Allergen Reactions   Penicillins Swelling and Other (See Comments)    Swelling of hands and feet Skin peeling   Lamisil [Terbinafine] Hives   Zestril [Lisinopril] Cough   Cipro  [Ciprofloxacin  Hcl] Hives and Rash   Social History   Socioeconomic History   Marital status: Married    Spouse name: Not on file   Number of children: 3   Years of education: Not on file   Highest education level: Not on file  Occupational History   Occupation: Disability    Employer: [employer]  Tobacco Use   Smoking status: Former    Current packs/day: 0.00    Types: Cigarettes    Quit date: 11/02/1999    Years since quitting: 23.9   Smokeless tobacco: Never  Vaping Use   Vaping status: Never Used  Substance and Sexual Activity   Alcohol use: No    Alcohol/week: 0.0 standard drinks of alcohol   Drug use: No   Sexual activity: Not Currently  Other Topics Concern   Not on file  Social History Narrative   ** Merged History Encounter ** Married, 3 kids   Prior work [employer] before disability   Regular exercise-yes   Works some doing Aeronautical engineer    Social Drivers of Corporate investment banker Strain: Low Risk  (02/15/2023)   Overall Financial Resource Strain (CARDIA)    Difficulty of Paying Living Expenses: Not hard at all  Food Insecurity: No Food Insecurity (08/01/2023)   Hunger Vital Sign    Worried About Running Out of Food in the Last Year: Never true    Ran Out of Food in the Last Year: Never true  Transportation Needs: No Transportation Needs (08/01/2023)   PRAPARE - Administrator, Civil Service (Medical): No    Lack of Transportation (Non-Medical): No  Physical  Activity: Sufficiently Active (02/15/2023)   Exercise Vital Sign    Days of Exercise per Week: 5 days    Minutes of Exercise per Session: 80 min  Stress: No Stress Concern Present (02/15/2023)   [employer] of Occupational Health - Occupational Stress Questionnaire    Feeling of Stress : Not at all  Social Connections: Socially Integrated (07/27/2023)   Social Connection and Isolation Panel    Frequency of Communication with Friends and Family: More than three times a week    Frequency of Social Gatherings with Friends and Family: More than three times a  week    Attends Religious Services: More than 4 times per year    Active Member of Clubs or Organizations: Yes    Attends Banker Meetings: More than 4 times per year    Marital Status: Married  Catering manager Violence: Not At Risk (08/01/2023)   Humiliation, Afraid, Rape, and Kick questionnaire    Fear of Current or Ex-Partner: No    Emotionally Abused: No    Physically Abused: No    Sexually Abused: No     Review of Systems  All other systems reviewed and are negative.      Objective:   Physical Exam Vitals reviewed.  Constitutional:      General: He is not in acute distress.    Appearance: Normal appearance. He is normal weight. He is not ill-appearing, toxic-appearing or diaphoretic.  HENT:     Right Ear: Tympanic membrane and ear canal normal.     Left Ear: Tympanic membrane and ear canal normal.     Mouth/Throat:     Mouth: Mucous membranes are moist.     Pharynx: Oropharynx is clear. No oropharyngeal exudate or posterior oropharyngeal erythema.  Eyes:     General:        Right eye: No discharge.        Left eye: No discharge.     Extraocular Movements: Extraocular movements intact.     Conjunctiva/sclera: Conjunctivae normal.     Pupils: Pupils are equal, round, and reactive to light.  Neck:     Vascular: No carotid bruit.  Cardiovascular:     Rate and Rhythm: Normal rate and regular rhythm.      Pulses: Normal pulses.     Heart sounds: Normal heart sounds. No murmur heard.    No friction rub. No gallop.  Pulmonary:     Effort: Pulmonary effort is normal. No respiratory distress.     Breath sounds: Normal breath sounds. No stridor. No wheezing, rhonchi or rales.  Chest:     Chest wall: No deformity, swelling or crepitus.  Abdominal:     General: Abdomen is flat. Bowel sounds are normal. There is no distension.     Palpations: Abdomen is soft.     Tenderness: There is no abdominal tenderness. There is no guarding or rebound.  Musculoskeletal:     Cervical back: Normal range of motion and neck supple.     Right lower leg: No edema.     Left lower leg: No edema.  Lymphadenopathy:     Cervical: No cervical adenopathy.  Skin:    Findings: No erythema.  Neurological:     General: No focal deficit present.     Mental Status: He is oriented to person, place, and time. Mental status is at baseline.     Motor: No weakness.     Coordination: Coordination normal.     Gait: Gait normal.     Deep Tendon Reflexes: Reflexes normal.  Psychiatric:        Mood and Affect: Mood normal.        Behavior: Behavior normal.        Thought Content: Thought content normal.           Assessment & Plan:  Type 2 diabetes mellitus with retinopathy, with long-term current use of insulin , macular edema presence unspecified, unspecified laterality, unspecified retinopathy severity (HCC) - Plan: CBC with Differential/Platelet, Comprehensive metabolic panel with GFR, Lipid panel, Microalbumin/Creatinine Ratio, Urine  History of osteomyelitis  Screening for prostate  cancer - Plan: PSA  Pure hypercholesterolemia  Routine general medical examination at a health care facility Regarding his cancer screening, I will schedule the patient for a colonoscopy.  I will screen for prostate cancer with a PSA.  Regarding his immunizations, his shingles vaccine is up-to-date.  He received Prevnar 20 today.   Diabetic foot exam is up-to-date, he is regularly seeing his podiatrist.  Diabetic eye exam is scheduled.  Check CBC CMP lipid panel A1c and urine microalbumin creatinine ratio.

## 2023-10-20 LAB — LIPID PANEL
Cholesterol: 127 mg/dL (ref <200)
HDL: 41 mg/dL (ref >=40)
LDL Cholesterol (Calc): 68 mg/dL
Non-HDL Cholesterol (Calc): 86 mg/dL (ref <130)
Total CHOL/HDL Ratio: 3.1 (calc) (ref <5.0)
Triglycerides: 94 mg/dL (ref <150)

## 2023-10-20 LAB — PSA: PSA: 1.03 ng/mL (ref <=4.00)

## 2023-10-20 LAB — COMPREHENSIVE METABOLIC PANEL WITH GFR
AG Ratio: 1.2 (calc) (ref 1.0–2.5)
ALT: 21 U/L (ref 9–46)
AST: 23 U/L (ref 10–35)
Albumin: 3.9 g/dL (ref 3.6–5.1)
Alkaline phosphatase (APISO): 78 U/L (ref 35–144)
BUN: 19 mg/dL (ref 7–25)
CO2: 20 mmol/L (ref 20–32)
Calcium: 9.1 mg/dL (ref 8.6–10.3)
Chloride: 110 mmol/L (ref 98–110)
Creat: 1.16 mg/dL (ref 0.70–1.35)
Globulin: 3.2 g/dL (ref 1.9–3.7)
Glucose, Bld: 89 mg/dL (ref 65–99)
Potassium: 3.8 mmol/L (ref 3.5–5.3)
Sodium: 141 mmol/L (ref 135–146)
Total Bilirubin: 0.8 mg/dL (ref 0.2–1.2)
Total Protein: 7.1 g/dL (ref 6.1–8.1)
eGFR: 69 mL/min/1.73m2 (ref >=60)

## 2023-10-20 LAB — CBC WITH DIFFERENTIAL/PLATELET
Absolute Lymphocytes: 1648 {cells}/uL (ref 850–3900)
Absolute Monocytes: 439 {cells}/uL (ref 200–950)
Basophils Absolute: 41 {cells}/uL (ref 0–200)
Basophils Relative: 1 %
Eosinophils Absolute: 357 {cells}/uL (ref 15–500)
Eosinophils Relative: 8.7 %
HCT: 33.1 % — ABNORMAL LOW (ref 38.5–50.0)
Hemoglobin: 11.1 g/dL — ABNORMAL LOW (ref 13.2–17.1)
MCH: 28.7 pg (ref 27.0–33.0)
MCHC: 33.5 g/dL (ref 32.0–36.0)
MCV: 85.5 fL (ref 80.0–100.0)
MPV: 10.8 fL (ref 7.5–12.5)
Monocytes Relative: 10.7 %
Neutro Abs: 1615 {cells}/uL (ref 1500–7800)
Neutrophils Relative %: 39.4 %
Platelets: 157 Thousand/uL (ref 140–400)
RBC: 3.87 Million/uL — ABNORMAL LOW (ref 4.20–5.80)
RDW: 14.6 % (ref 11.0–15.0)
Total Lymphocyte: 40.2 %
WBC: 4.1 Thousand/uL (ref 3.8–10.8)

## 2023-10-20 LAB — MICROALBUMIN / CREATININE URINE RATIO
Creatinine, Urine: 198 mg/dL (ref 20–320)
Microalb Creat Ratio: 16 mg/g{creat} (ref <30)
Microalb, Ur: 3.2 mg/dL

## 2023-10-22 ENCOUNTER — Ambulatory Visit: Payer: Self-pay | Admitting: Family Medicine

## 2023-11-01 ENCOUNTER — Ambulatory Visit (INDEPENDENT_AMBULATORY_CARE_PROVIDER_SITE_OTHER): Admitting: Podiatry

## 2023-11-01 DIAGNOSIS — M86172 Other acute osteomyelitis, left ankle and foot: Secondary | ICD-10-CM | POA: Diagnosis not present

## 2023-11-01 DIAGNOSIS — Z9889 Other specified postprocedural states: Secondary | ICD-10-CM

## 2023-11-01 NOTE — Progress Notes (Signed)
  Subjective:  Patient ID: Aaron Fox, male    DOB: 08-31-1955,  MRN: [id number]  No chief complaint on file.   DOS: 09/26/2023 Procedure: Amputation of left second toe through proximal phalanx head.  68 y.o. male seen for post op check.   Patient is approximately 4 weeks out from his left second toe amputation.  Coming in for final recheck to make sure full healing is occurred.  Denies any drainage denies any problems with the amputation site walking in regular shoe gear  Review of Systems: Negative except as noted in the HPI. Denies N/V/F/Ch.   Objective:   Constitutional Well developed. Well nourished.  Vascular Foot warm and well perfused. Capillary refill normal to all digits.   No calf pain with palpation  Neurologic Normal speech. Oriented to person, place, and time. Epicritic sensation diminished  Dermatologic Amputation site fully healed on the left second toe no evidence of residual infection or wound.    Orthopedic: Status post partial left second toe amputation through proximal phalanx head, prior hallux amputation   Radiographs: Deferred  Pathology: A. TOE, LEFT SECOND, AMPUTATION:  - Soft tissue ulceration and necrosis with underlying acute  osteomyelitis.  - Proximal resection margin with cut surface of bone; negative for osteomyelitis.   Micro: Deferred, surgical cure  Assessment:   1. Acute osteomyelitis of toe, left (HCC)   2. Post-operative state     Osteomyelitis of left second toe status post partial left second toe amputation through proximal phalanx head  Plan:  Patient was evaluated and treated and all questions answered.  4 weeks s/p partial left second toe amputation through proximal phalanx at -Fully healed at this time, no wound care needed -XR: Deferred -WB Status: Weightbearing as tolerated in regular shoe gear -Will have him make appointment with our pedorthist Trish for diabetic inserts -Provided toe For the left third toe  which is in flexion contracture rigid deformity not amenable to flexor tenotomy.  Advised to monitor that toe closely for ulceration development -Sutures: Previously removed -Medications/ABX: Status post course of antibiotics no further indicated -Dressing: None required - F/u Plan:  follow up in 3 months with Dr. Alona regular provider for routine footcare for remaining nails on the left foot and diabetic foot exam.         Aaron Fox, DPM [facility] / [facility]

## 2023-11-02 ENCOUNTER — Ambulatory Visit: Admitting: Podiatry

## 2023-11-12 ENCOUNTER — Ambulatory Visit (INDEPENDENT_AMBULATORY_CARE_PROVIDER_SITE_OTHER): Admitting: Family Medicine

## 2023-11-12 VITALS — BP 130/64 | HR 64 | Temp 98.4°F | Ht 71.5 in | Wt 220.4 lb

## 2023-11-12 DIAGNOSIS — M5412 Radiculopathy, cervical region: Secondary | ICD-10-CM | POA: Diagnosis not present

## 2023-11-12 MED ORDER — PREDNISONE 20 MG PO TABS: ORAL_TABLET | ORAL | 0 refills | Status: DC

## 2023-11-12 NOTE — Progress Notes (Signed)
 Subjective:    Patient ID: Aaron Fox, male    DOB: Aug 04, 1955, 68 y.o.   MRN: [id number]  Patient reports severe left-sided neck pain and pain radiating down his left arm into his left hand for over 2 months.  He reports numbness and tingling in his left arm x 2 months from his shoulder to his fingertips.  He denies any weakness.  He denies any loss of grip strength.  Muscle strength is 5/5 equal and symmetric in the upper extremities.  However he does have some numbness in his fingertips.  He also reports whitening like electrical pains shooting from his neck down his arm with certain motions of his neck and his left arm.  Pain is not improving.  He has a Lowne 8 weeks for the pain to improve HOWEVER ITS ONLY WORSENED. 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   Lymphadenopathy    Peripheral neuropathy    PUD (peptic ulcer disease)    Rheumatoid arthritis(714.0) 09/22/2006   Past Surgical History:  Procedure Laterality Date   ACHILLES TENDON SURGERY Right 04/25/2019   Procedure: ACHILLES LENGTHENING;  Surgeon: Gershon Donnice SAUNDERS, DPM;  Location: [facility];  Service: Podiatry;  Laterality: Right;   AMPUTATION Right 04/25/2019   Procedure: AMPUTATION FOOT,TRANSMETATARSAL;  Surgeon: Gershon Donnice SAUNDERS, DPM;  Location: [facility];  Service: Podiatry;  Laterality: Right;   AMPUTATION TOE Left 07/30/2023   Procedure: AMPUTATION, TOE;  Surgeon: Malvin Marsa FALCON,  DPM;  Location: [facility];  Service: Orthopedics/Podiatry;  Laterality: Left;  LEFT GREAT TOE AMPUTAION   AMPUTATION TOE Left 09/26/2023   Procedure: AMPUTATION,  SECOND TOE;  Surgeon: Malvin Marsa FALCON, DPM;  Location: [facility];  Service: Orthopedics/Podiatry;  Laterality: Left;  2nd toe   CATARACT EXTRACTION Bilateral    Dr. Lavonia   CATARACT EXTRACTION, BILATERAL     COLONOSCOPY  2004   LYMPH NODE BIOPSY Right 10/16/2014   Procedure: RIGHT INGUINAL LYMPH NODE BIOPSY;  Surgeon: Oneil Budge, MD;  Location: [facility];  Service: General;  Laterality: Right;   Current Outpatient Medications on File Prior to Visit  Medication Sig Dispense Refill   Accu-Chek Softclix Lancets lancets TEST BLOOD SUGAR 7 TIMES DAILY 600 each 3   aspirin  81 MG tablet Take 81 mg by mouth in the morning.     atorvastatin  (LIPITOR) 10 MG tablet TAKE 1 TABLET EVERY DAY 90 tablet 3   gabapentin  (NEURONTIN ) 300 MG capsule TAKE 2 CAPSULES THREE TIMES DAILY 540 capsule 2   gentamicin  ointment (GARAMYCIN ) 0.1 % Apply 1 Application topically daily. Apply once daily to left 2nd toe ulcer 15 g 0   glucose blood (ACCU-CHEK GUIDE TEST) test strip Use as instructed to monitor glucose 4 times daily 300 each 6   insulin  isophane & regular human KwikPen (NOVOLIN  70/30 KWIKPEN) (70-30) 100 UNIT/ML KwikPen Inject 10 Units  into the skin 2 (two) times daily before a meal. 15 mL 1   INSULIN  SYRINGE 1CC/29G (DROPLET INSULIN  SYRINGE) 29G X 1/2 1 ML MISC Use to inject in morning and bedtime 200 each 1   losartan  (COZAAR ) 50 MG tablet TAKE 1 TABLET EVERY DAY 90 tablet 10   Multiple Vitamins-Minerals (MULTIVITAMIN MEN 50+) TABS Take 1 tablet by mouth daily. Men's one a Day     Tetrahydrozoline HCl (VISINE OP) Place 1 drop into both eyes daily as needed (redness).     triamcinolone  cream (KENALOG ) 0.1 % APPLY TO THE AFFECTED AREA(S) TOPICALLY THREE TIMES DAILY AS NEEDED FOR RASH 45 g 3   No current facility-administered medications on file prior to  visit.   Allergies  Allergen Reactions   Penicillins Swelling and Other (See Comments)    Swelling of hands and feet Skin peeling   Lamisil [Terbinafine] Hives   Zestril [Lisinopril] Cough   Cipro  [Ciprofloxacin  Hcl] Hives and Rash   Social History   Socioeconomic History   Marital status: Married    Spouse name: Not on file   Number of children: 3   Years of education: Not on file   Highest education level: Not on file  Occupational History   Occupation: Disability    Employer: [employer]  Tobacco Use   Smoking status: Former    Current packs/day: 0.00    Types: Cigarettes    Quit date: 11/02/1999    Years since quitting: 24.0   Smokeless tobacco: Never  Vaping Use   Vaping status: Never Used  Substance and Sexual Activity   Alcohol use: No    Alcohol/week: 0.0 standard drinks of alcohol   Drug use: No   Sexual activity: Not Currently  Other Topics Concern   Not on file  Social History Narrative   ** Merged History Encounter ** Married, 3 kids   Prior work [employer] before disability   Regular exercise-yes   Works some doing Aeronautical engineer    Social Drivers of Corporate investment banker Strain: Low Risk  (02/15/2023)   Overall Financial Resource Strain (CARDIA)    Difficulty of Paying Living Expenses: Not hard at all  Food Insecurity: No Food Insecurity (08/01/2023)   Hunger Vital Sign    Worried About Running Out of Food in the Last Year: Never true    Ran Out of Food in the Last Year: Never true  Transportation Needs: No Transportation Needs (08/01/2023)   PRAPARE - Administrator, Civil Service (Medical): No    Lack of Transportation (Non-Medical): No  Physical Activity: Sufficiently Active (02/15/2023)   Exercise Vital Sign    Days of Exercise per Week: 5 days    Minutes of Exercise per Session: 80 min  Stress: No Stress Concern Present (02/15/2023)   [employer] of Occupational Health - Occupational Stress Questionnaire    Feeling of Stress  : Not at all  Social Connections: Socially Integrated (07/27/2023)   Social Connection and Isolation Panel    Frequency of Communication with Friends and Family: More than three times a week    Frequency of Social Gatherings with Friends and Family: More than three times a week    Attends Religious Services: More than 4 times per year    Active Member of [employer] or Organizations: Yes    Attends Banker Meetings: More than 4 times per year    Marital Status: Married  Catering manager Violence: Not At Risk (08/01/2023)  Humiliation, Afraid, Rape, and Kick questionnaire    Fear of Current or Ex-Partner: No    Emotionally Abused: No    Physically Abused: No    Sexually Abused: No     Review of Systems  All other systems reviewed and are negative.      Objective:   Physical Exam Vitals reviewed.  Constitutional:      General: He is not in acute distress.    Appearance: Normal appearance. He is normal weight. He is not ill-appearing, toxic-appearing or diaphoretic.  Cardiovascular:     Rate and Rhythm: Normal rate and regular rhythm.     Heart sounds: Normal heart sounds. No murmur heard. Pulmonary:     Effort: Pulmonary effort is normal. No respiratory distress.     Breath sounds: Normal breath sounds. No stridor. No wheezing, rhonchi or rales.  Musculoskeletal:     Left upper arm: Tenderness present. No swelling, edema, deformity or bony tenderness.       Arms:     Cervical back: Normal range of motion. No rigidity or crepitus. No pain with movement, spinous process tenderness or muscular tenderness.     Reflexes in his arm are equal and symmetric bilaterally, 2/4 at the biceps, 2/4 at the brachioradialis, 2 and 4 the triceps.  He has muscle strength 5/5 equal and symmetric in both arms including shoulder abduction, elbow flexion and extension, along grip strength      Assessment & Plan:  Cervical radiculopathy - Plan: MR Cervical Spine Wo Contrast Symptoms  suggest a pinched nerve in his neck likely due to a bulging disc.  Symptoms are present now for 8 weeks and if they will tincture of time and conservative rest.  Proceed with an MRI of the cervical spine to evaluate for the cause of the pinched nerve in his neck.  Start prednisone  taper pack.  Increase insulin  by 5 units twice daily to compensate for the elevated blood sugars and will arise.  Once he is off prednisone  he can decrease insulin  back to his baseline.  Patient declines any pain medication at the present time.

## 2023-11-13 ENCOUNTER — Encounter: Payer: Self-pay | Admitting: Internal Medicine

## 2023-11-13 ENCOUNTER — Other Ambulatory Visit: Payer: Self-pay | Admitting: Family Medicine

## 2023-11-13 ENCOUNTER — Ambulatory Visit: Payer: Self-pay

## 2023-11-13 MED ORDER — OXYCODONE-ACETAMINOPHEN 5-325 MG PO TABS: 1.0000 | ORAL_TABLET | Freq: Four times a day (QID) | ORAL | 0 refills | Status: DC | PRN

## 2023-11-13 NOTE — Telephone Encounter (Signed)
 Copied from CRM [id number]. Topic: Clinical - Red Word Triage >> Nov 13, 2023  9:59 AM Tobias L wrote: Red Word that prompted transfer to Nurse Triage: in a lot of pain, requesting prescription for pain, patient did not sleep at all last night due to the pain Reason for Disposition  Prescription request for new medicine (not a refill)  Answer Assessment - Initial Assessment Questions Patient requesting pain meds due to symptoms. Saw PCP on 11/12/2023 for symptoms and declined medication yesterday. He states he was not able to sleep last night due to pain. Pt denies chest pain, dizziness, or shortness of breath at time of call. Pt requesting a call back if medication is approved. Pharmacy of choice below.   [facility]  [address], [place], [place] [address]  1. QUESTION: What is your question? (e.g., double dose of medicine, side effect)     Requesting medication for pain  2. SYMPTOMS: Do you have any symptoms? If Yes, ask: What symptoms are you having?  How bad are the symptoms (e.g., mild, moderate, severe)     Severe neck pain that travels to L shoulder and L arm, and fingers are tingling due to the pain.  Protocols used: Medication Question Call-A-AH

## 2023-11-14 NOTE — Telephone Encounter (Signed)
 Pt called and I advised him of the message that Ronal Bradley had in [place] about the medication. Pt said thank you and he appreciates it.

## 2023-11-15 ENCOUNTER — Other Ambulatory Visit: Payer: Self-pay | Admitting: Family Medicine

## 2023-11-15 ENCOUNTER — Telehealth: Payer: Self-pay

## 2023-11-15 MED ORDER — OXYCODONE-ACETAMINOPHEN 5-325 MG PO TABS: 1.0000 | ORAL_TABLET | Freq: Four times a day (QID) | ORAL | 0 refills | Status: AC | PRN

## 2023-11-15 NOTE — Telephone Encounter (Signed)
 Copied from CRM [id number]. Topic: Clinical - Prescription Issue >> Nov 15, 2023 10:24 AM Selinda RAMAN wrote: Reason for CRM: [place] the pharmacist at [facility] called in stating there are issues with the patients prescription oxyCODONE -acetaminophen  (PERCOCET/ROXICET) 5-325 MG tablet. He states the MME has to be 50 or less and it is not written that way. He says the patient is opoid naive and he would like a new script indicating this or a call back to  [facility] [address], [place] - [address]  Phone: [phone] Fax: [phone]   Please assist patient further

## 2023-11-22 ENCOUNTER — Telehealth: Payer: Self-pay

## 2023-11-22 ENCOUNTER — Telehealth: Payer: Self-pay | Admitting: Family Medicine

## 2023-11-22 NOTE — Telephone Encounter (Signed)
 Pt said that when he went to pick up his pain medication and the pharmacy told him that they couldn't fill it because the dosage is too high. Pt is very frustrated. Thank you.

## 2023-11-22 NOTE — Telephone Encounter (Unsigned)
 Copied from CRM #[id number]. Topic: Clinical - Medication Question >> Nov 22, 2023  3:41 PM Tiffini S wrote: Reason for CRM: Tameka with [facility] pharmacy [phone] called to see if they could add on the instructions: maximum 6 tablet per 24 hr for Oxycodone  (Percocet) 5- 325 mg

## 2023-11-28 ENCOUNTER — Ambulatory Visit

## 2023-11-28 NOTE — Progress Notes (Signed)
 Patient presents to the office today for diabetic shoe and insole measuring.  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

## 2023-11-30 ENCOUNTER — Telehealth: Payer: Self-pay | Admitting: Family Medicine

## 2023-11-30 NOTE — Telephone Encounter (Signed)
 Copied from CRM [id number]. Topic: Clinical - Medical Advice >> Nov 30, 2023  8:16 AM Everette C wrote: Reason for CRM: The patient would like to be contacted by a member of administrative or referrals staff to discuss their upcoming MRI. The patient would like additional information about the upcoming procedure (date,location) when possible

## 2023-12-05 ENCOUNTER — Ambulatory Visit: Payer: Self-pay

## 2023-12-05 NOTE — Telephone Encounter (Signed)
 FYI Only or Action Required?: FYI only for provider.  Patient was last seen in primary care on 11/12/2023 by Duanne Butler DASEN, MD.  Called Nurse Triage reporting Pain.  Symptoms began 1.5 weeks.  Interventions attempted: OTC medications: tylenol  arthritis without relief.  Symptoms are: gradually worsening.  Triage Disposition: See PCP When Office is Open (Within 3 Days)  Patient/caregiver understands and will follow disposition?: Yes     Copied from CRM #[id number]. Topic: Clinical - Red Word Triage >> Dec 05, 2023  4:28 PM Selinda RAMAN wrote: Red Word that prompted transfer to Nurse Triage: The patient called in stating he is having severe pain and swelling in his right knee. He states one leg is definitely more swollen than the other. I will transfer him to [facility]. Reason for Disposition  [1] MODERATE pain (e.g., interferes with normal activities, limping) AND [2] present > 3 days  Answer Assessment - Initial Assessment Questions 1. ONSET: When did the pain start?      Over x week and worsening 2. LOCATION: Where is the pain located?      Right knee 3. PAIN: How bad is the pain?    (Scale 1-10; or mild, moderate, severe)     8/10 4. WORK OR EXERCISE: Has there been any recent work or exercise that involved this part of the body?      no 5. CAUSE: What do you think is causing the leg pain?     no 6. OTHER SYMPTOMS: Do you have any other symptoms? (e.g., chest pain, back pain, breathing difficulty, swelling, rash, fever, numbness, weakness)     Swelling-severe, numbness above knee at times, 7. PREGNANCY: Is there any chance you are pregnant? When was your last menstrual period?     na   Pt stated this happened on left knee  & had fluid off  Protocols used: Leg Pain-A-AH

## 2023-12-07 ENCOUNTER — Ambulatory Visit (INDEPENDENT_AMBULATORY_CARE_PROVIDER_SITE_OTHER): Admitting: Family Medicine

## 2023-12-07 ENCOUNTER — Ambulatory Visit (HOSPITAL_COMMUNITY)

## 2023-12-07 ENCOUNTER — Encounter: Payer: Self-pay | Admitting: Family Medicine

## 2023-12-07 VITALS — BP 138/72 | HR 74 | Temp 98.0°F | Ht 71.5 in | Wt 223.6 lb

## 2023-12-07 DIAGNOSIS — M25561 Pain in right knee: Secondary | ICD-10-CM | POA: Diagnosis not present

## 2023-12-07 DIAGNOSIS — G8929 Other chronic pain: Secondary | ICD-10-CM

## 2023-12-07 NOTE — Progress Notes (Signed)
 Subjective:    Patient ID: Aaron Fox, male    DOB: 01-Apr-1955, 68 y.o.   MRN: [id number]  Patient has been dealing with knee pain for over a year.  He saw orthopedics in April of last year and had an x-ray that showed mild degenerative changes of the origin of the right knee.  I saw the patient last year and gave him a cortisone injection he.  This only relieve the pain for 1 to 2 months.  He states that over the last Avril months that the pain is getting worse.  Today the right knee is swollen.  There is a moderate effusion.  It is slightly warm to the touch.  There is no erythema.  He states that hurts to stand.  It hurts to walk.  We discussed another cortisone injection.  He states that he does have to.  He has had 1 for maybe a month from different orthopedist.  However the pain is becoming worse and he would like it definitively managed.  He would like to see a different orthopedist for second opinion 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   Lymphadenopathy    Peripheral neuropathy    PUD (peptic ulcer disease)    Rheumatoid arthritis(714.0) 09/22/2006   Past Surgical History:  Procedure Laterality Date   ACHILLES TENDON SURGERY Right 04/25/2019   Procedure: ACHILLES LENGTHENING;  Surgeon: Gershon Donnice SAUNDERS, DPM;  Location: [facility];  Service: Podiatry;  Laterality: Right;   AMPUTATION Right 04/25/2019   Procedure: AMPUTATION FOOT,TRANSMETATARSAL;   Surgeon: Gershon Donnice SAUNDERS, DPM;  Location: [facility];  Service: Podiatry;  Laterality: Right;   AMPUTATION TOE Left 07/30/2023   Procedure: AMPUTATION, TOE;  Surgeon: Malvin Marsa FALCON, DPM;  Location: [facility];  Service: Orthopedics/Podiatry;  Laterality: Left;  LEFT GREAT TOE AMPUTAION   AMPUTATION TOE Left 09/26/2023   Procedure: AMPUTATION,  SECOND TOE;  Surgeon: Malvin Marsa FALCON, DPM;  Location: [facility];  Service: Orthopedics/Podiatry;  Laterality: Left;  2nd toe   CATARACT EXTRACTION Bilateral    Dr. Lavonia   CATARACT EXTRACTION, BILATERAL     COLONOSCOPY  2004   LYMPH NODE BIOPSY Right 10/16/2014   Procedure: RIGHT INGUINAL LYMPH NODE BIOPSY;  Surgeon: Oneil Budge, MD;  Location: [facility];  Service: General;  Laterality: Right;   Current Outpatient Medications on File Prior to Visit  Medication Sig Dispense Refill   Accu-Chek Softclix Lancets lancets TEST BLOOD SUGAR 7 TIMES DAILY 600 each 3   aspirin  81 MG tablet Take 81 mg by mouth in the morning.     atorvastatin  (LIPITOR) 10 MG tablet TAKE 1 TABLET EVERY DAY 90 tablet 3   gabapentin  (NEURONTIN ) 300 MG capsule TAKE 2 CAPSULES THREE TIMES DAILY 540 capsule 2   gentamicin  ointment (GARAMYCIN ) 0.1 % Apply 1 Application topically daily. Apply once daily to left  2nd toe ulcer 15 g 0   glucose blood (ACCU-CHEK GUIDE TEST) test strip Use as instructed to monitor glucose 4 times daily 300 each 6   insulin  isophane & regular human KwikPen (NOVOLIN  70/30 KWIKPEN) (70-30) 100 UNIT/ML KwikPen Inject 10 Units into the skin 2 (two) times daily before a meal. 15 mL 1   INSULIN  SYRINGE 1CC/29G (DROPLET INSULIN  SYRINGE) 29G X 1/2 1 ML MISC Use to inject in morning and bedtime 200 each 1   losartan  (COZAAR ) 50 MG tablet TAKE 1 TABLET EVERY DAY 90 tablet 10   Multiple Vitamins-Minerals (MULTIVITAMIN MEN 50+) TABS Take 1 tablet by mouth daily. Men's one a Day     predniSONE  (DELTASONE ) 20 MG tablet 3 tabs poqday 1-2, 2 tabs poqday 3-4, 1 tab poqday  5-6 12 tablet 0   Tetrahydrozoline HCl (VISINE OP) Place 1 drop into both eyes daily as needed (redness).     triamcinolone  cream (KENALOG ) 0.1 % APPLY TO THE AFFECTED AREA(S) TOPICALLY THREE TIMES DAILY AS NEEDED FOR RASH 45 g 3   No current facility-administered medications on file prior to visit.   Allergies  Allergen Reactions   Penicillins Swelling and Other (See Comments)    Swelling of hands and feet Skin peeling   Lamisil [Terbinafine] Hives   Zestril [Lisinopril] Cough   Cipro  [Ciprofloxacin  Hcl] Hives and Rash   Social History   Socioeconomic History   Marital status: Married    Spouse name: Not on file   Number of children: 3   Years of education: Not on file   Highest education level: Not on file  Occupational History   Occupation: Disability    Employer: [employer]  Tobacco Use   Smoking status: Former    Current packs/day: 0.00    Types: Cigarettes    Quit date: 11/02/1999    Years since quitting: 24.1   Smokeless tobacco: Never  Vaping Use   Vaping status: Never Used  Substance and Sexual Activity   Alcohol use: No    Alcohol/week: 0.0 standard drinks of alcohol   Drug use: No   Sexual activity: Not Currently  Other Topics Concern   Not on file  Social History Narrative   ** Merged History Encounter ** Married, 3 kids   Prior work [employer] before disability   Regular exercise-yes   Works some doing Aeronautical engineer    Social Drivers of Corporate investment banker Strain: Low Risk  (02/15/2023)   Overall Financial Resource Strain (CARDIA)    Difficulty of Paying Living Expenses: Not hard at all  Food Insecurity: No Food Insecurity (08/01/2023)   Hunger Vital Sign    Worried About Running Out of Food in the Last Year: Never true    Ran Out of Food in the Last Year: Never true  Transportation Needs: No Transportation Needs (08/01/2023)   PRAPARE - Administrator, Civil Service (Medical): No    Lack of Transportation (Non-Medical): No  Physical  Activity: Sufficiently Active (02/15/2023)   Exercise Vital Sign    Days of Exercise per Week: 5 days    Minutes of Exercise per Session: 80 min  Stress: No Stress Concern Present (02/15/2023)   [employer] of Occupational Health - Occupational Stress Questionnaire    Feeling of Stress : Not at all  Social Connections: Socially Integrated (07/27/2023)   Social Connection and Isolation Panel    Frequency of Communication with Friends and Family: More than three times a week  Frequency of Social Gatherings with Friends and Family: More than three times a week    Attends Religious Services: More than 4 times per year    Active Member of [employer] or Organizations: Yes    Attends Banker Meetings: More than 4 times per year    Marital Status: Married  Catering manager Violence: Not At Risk (08/01/2023)   Humiliation, Afraid, Rape, and Kick questionnaire    Fear of Current or Ex-Partner: No    Emotionally Abused: No    Physically Abused: No    Sexually Abused: No     Review of Systems  All other systems reviewed and are negative.      Objective:   Physical Exam Vitals reviewed.  Constitutional:      General: He is not in acute distress.    Appearance: Normal appearance. He is normal weight. He is not ill-appearing, toxic-appearing or diaphoretic.  Cardiovascular:     Rate and Rhythm: Normal rate and regular rhythm.     Heart sounds: Normal heart sounds. No murmur heard. Pulmonary:     Effort: Pulmonary effort is normal. No respiratory distress.     Breath sounds: Normal breath sounds. No stridor. No wheezing, rhonchi or rales.  Musculoskeletal:     Right knee: Swelling and effusion present. No erythema. Decreased range of motion. Tenderness present over the medial joint line.     Left knee: Normal range of motion. No tenderness.           Assessment & Plan:  Chronic pain of right knee - Plan: Ambulatory referral to Orthopedic Surgery Pain and swelling in  the right knee is out of proportion to the x-ray findings from a year ago.  I believe that this is more than just osteoarthritis.  I suspect the patient may have an underlying cartilage injury or even meniscal tear causing his pain.  We discussed getting an MRI however he would like to see an orthopedic surgeon who operated on his cousin to get a second opinion.  I will be happy to place that referral.

## 2023-12-10 ENCOUNTER — Encounter: Payer: Self-pay | Admitting: Internal Medicine

## 2023-12-11 ENCOUNTER — Ambulatory Visit (HOSPITAL_COMMUNITY)
Admission: RE | Admit: 2023-12-11 | Discharge: 2023-12-11 | Disposition: A | Discharge status: Home / Self Care | Source: Ambulatory Visit | Attending: Family Medicine | Admitting: Family Medicine

## 2023-12-11 DIAGNOSIS — M5010 Cervical disc disorder with radiculopathy, unspecified cervical region: Secondary | ICD-10-CM | POA: Diagnosis not present

## 2023-12-11 DIAGNOSIS — M4722 Other spondylosis with radiculopathy, cervical region: Secondary | ICD-10-CM | POA: Diagnosis not present

## 2023-12-11 DIAGNOSIS — M4802 Spinal stenosis, cervical region: Secondary | ICD-10-CM | POA: Diagnosis not present

## 2023-12-11 DIAGNOSIS — M5412 Radiculopathy, cervical region: Secondary | ICD-10-CM | POA: Diagnosis not present

## 2023-12-12 ENCOUNTER — Telehealth: Payer: Self-pay

## 2023-12-12 NOTE — Telephone Encounter (Signed)
 Prior auth faxed for [id number] to [facility]

## 2023-12-13 NOTE — Telephone Encounter (Signed)
 DM SHOE PPWK [facility]

## 2023-12-14 ENCOUNTER — Ambulatory Visit: Payer: Self-pay | Admitting: Family Medicine

## 2023-12-14 ENCOUNTER — Other Ambulatory Visit: Payer: Self-pay

## 2023-12-14 DIAGNOSIS — G589 Mononeuropathy, unspecified: Secondary | ICD-10-CM

## 2023-12-20 ENCOUNTER — Ambulatory Visit (AMBULATORY_SURGERY_CENTER): Admitting: *Deleted

## 2023-12-20 VITALS — Ht 71.5 in | Wt 220.0 lb

## 2023-12-20 DIAGNOSIS — Z860100 Personal history of colon polyps, unspecified: Secondary | ICD-10-CM

## 2023-12-20 MED ORDER — NA SULFATE-K SULFATE-MG SULF 17.5-3.13-1.6 GM/177ML PO SOLN: 1.0000 | Freq: Once | ORAL | 0 refills | Status: AC

## 2023-12-20 NOTE — Progress Notes (Signed)
 Pt's name and DOB verified at the beginning of the pre-visit with 2 identifiers  Pt denies any difficulty with ambulating,sitting, laying down or rolling side to side  Pt has no issues moving head neck or swallowing  No egg or soy allergy known to patient   No issues known to pt with past sedation  No FH of Malignant Hyperthermia  Pt is not on home 02   Pt is not on blood thinners   Pt denies issues with constipation   Pt is not on dialysis  Pt denise any abnormal heart rhythms   Pt denies any upcoming cardiac testing  Patient's chart reviewed by Norleen Schillings CNRA prior to pre-visit and patient appropriate for the [facility].  Pre-visit completed and red dot placed by patient's name on their procedure day (on provider's schedule).    Visit by phone  Pt states weight is 200 lb   Pt given  both [facility] main # and MD on call # prior to instructions.  Informed pt to come in at the time discussed and is shown on PV instructions.  Pt instructed to use Singlecare.com or GoodRx for a price reduction on prep  Instructed pt where to find PV instructions in My Ch. Copy of instructions  to be sent in mail and address read back to pt to verify correct on envelope. Instructed pt on all aspects of written instructions including med holds clothing to wear and foods to eat and not eat as well as after procedure legal restrictions and to call MD on call if needed.. Pt states understanding. Instructed pt to review instructions again prior to procedure and call main # given if has any questions or any issues. Pt states they will.

## 2023-12-21 NOTE — Telephone Encounter (Signed)
 Order placed on [employer] scanner  Will call patient for fitting when items are in and when auth is received Lolita Schultze Cped, CFo, CFm

## 2024-01-02 ENCOUNTER — Telehealth: Payer: Self-pay

## 2024-01-02 NOTE — Telephone Encounter (Signed)
 Copied from CRM [id number]. Topic: General - Other >> Jan 02, 2024 12:00 PM Delon DASEN wrote: Reason for CRM: patient returning call, no message was left.

## 2024-01-03 ENCOUNTER — Encounter: Payer: Self-pay | Admitting: Internal Medicine

## 2024-01-03 ENCOUNTER — Telehealth: Payer: Self-pay

## 2024-01-03 NOTE — Telephone Encounter (Signed)
 Shoes are in ; auth not rcvd Ppwk exp 03/07/24 Sending auth again today

## 2024-01-09 NOTE — Progress Notes (Signed)
 [facility] History and Physical   Primary Care Physician:  Duanne Butler DASEN, MD   Reason for Procedure:    Encounter Diagnosis  Name Primary?   Hx of colonic polyps Yes     Plan:    Colonoscopy     HPI: Aaron Fox is a 68 y.o. male with a history of removal of 4 adenomas in 2017, maximum size 7 mm.  He also has a history of sigmoid diverticulosis.  There were no polyps at colonoscopy in 2020 and he is here for surveillance examination.   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   Lymphadenopathy    Peripheral neuropathy    PUD (peptic ulcer disease)    Rheumatoid arthritis(714.0) 09/22/2006    Past Surgical History:  Procedure Laterality Date   ACHILLES TENDON SURGERY Right 04/25/2019   Procedure: ACHILLES LENGTHENING;  Surgeon: Gershon Donnice SAUNDERS, DPM;  Location: [facility];  Service: Podiatry;  Laterality: Right;   AMPUTATION Right 04/25/2019   Procedure: AMPUTATION FOOT,TRANSMETATARSAL;  Surgeon: Gershon Donnice SAUNDERS, DPM;  Location: [facility];  Service: Podiatry;  Laterality: Right;   AMPUTATION TOE Left 07/30/2023   Procedure: AMPUTATION, TOE;  Surgeon: Malvin Marsa FALCON, DPM;  Location: [facility];  Service: Orthopedics/Podiatry;  Laterality: Left;  LEFT GREAT TOE AMPUTAION   AMPUTATION TOE Left 09/26/2023   Procedure: AMPUTATION,  SECOND TOE;  Surgeon: Malvin Marsa FALCON, DPM;  Location: [facility];  Service: Orthopedics/Podiatry;  Laterality:  Left;  2nd toe   CATARACT EXTRACTION Bilateral    Dr. Lavonia   CATARACT EXTRACTION, BILATERAL     COLONOSCOPY  2004   LYMPH NODE BIOPSY Right 10/16/2014   Procedure: RIGHT INGUINAL LYMPH NODE BIOPSY;  Surgeon: Oneil Budge, MD;  Location: [facility];  Service: General;  Laterality: Right;     Current Outpatient Medications  Medication Sig Dispense Refill   Accu-Chek Softclix Lancets lancets TEST BLOOD SUGAR 7 TIMES DAILY 600 each 3   aspirin  81 MG tablet Take 81 mg by mouth in the morning.     atorvastatin  (LIPITOR) 10 MG tablet TAKE 1 TABLET EVERY DAY 90 tablet 3   gabapentin  (NEURONTIN ) 300 MG capsule TAKE 2 CAPSULES THREE TIMES DAILY 540 capsule 2   glucose blood (ACCU-CHEK GUIDE TEST) test strip Use as instructed to monitor glucose 4 times daily 300 each 6   insulin  isophane & regular human KwikPen (NOVOLIN  70/30 KWIKPEN) (70-30) 100 UNIT/ML KwikPen Inject 10 Units into the skin 2 (two) times daily before a meal. 15 mL 1   INSULIN  SYRINGE 1CC/29G (DROPLET INSULIN  SYRINGE) 29G X 1/2 1 ML MISC Use to inject in morning and bedtime 200 each 1   losartan  (COZAAR ) 50 MG tablet TAKE 1 TABLET EVERY DAY 90 tablet 10   Tetrahydrozoline HCl (VISINE OP) Place 1 drop into both eyes daily as needed (redness).     triamcinolone  cream (KENALOG ) 0.1 %  APPLY TO THE AFFECTED AREA(S) TOPICALLY THREE TIMES DAILY AS NEEDED FOR RASH 45 g 3   Current Facility-Administered Medications  Medication Dose Route Frequency Provider Last Rate Last Admin   0.9 %  sodium chloride  infusion  500 mL Intravenous Once Avram Lupita BRAVO, MD        Allergies as of 01/10/2024 - Review Complete 01/10/2024  Allergen Reaction Noted   Penicillins Swelling and Other (See Comments)    Lamisil [terbinafine] Hives    Cipro  [ciprofloxacin  hcl] Hives and Rash    Zestril [lisinopril] Cough 07/24/2013    Family History  Problem Relation Age of Onset   Heart disease Other        FH of CAD   Colon polyps Paternal Uncle    Kidney  disease Paternal Uncle    Diabetes Paternal Grandfather    Diabetes Maternal Uncle    Cancer Neg Hx    Colon cancer Neg Hx    Gallbladder disease Neg Hx    Esophageal cancer Neg Hx    Rectal cancer Neg Hx    Stomach cancer Neg Hx     Social History   Socioeconomic History   Marital status: Married    Spouse name: Not on file   Number of children: 3   Years of education: Not on file   Highest education level: Not on file  Occupational History   Occupation: Disability    Employer: [employer]  Tobacco Use   Smoking status: Former    Current packs/day: 0.00    Types: Cigarettes    Quit date: 11/02/1999    Years since quitting: 24.2   Smokeless tobacco: Never  Vaping Use   Vaping status: Never Used  Substance and Sexual Activity   Alcohol use: No    Alcohol/week: 0.0 standard drinks of alcohol   Drug use: No   Sexual activity: Not Currently  Other Topics Concern   Not on file  Social History Narrative   ** Merged History Encounter ** Married, 3 kids   Prior work [employer] before disability   Regular exercise-yes   Works some doing aeronautical engineer    Social Drivers of Corporate Investment Banker Strain: Low Risk  (02/15/2023)   Overall Financial Resource Strain (CARDIA)    Difficulty of Paying Living Expenses: Not hard at all  Food Insecurity: No Food Insecurity (08/01/2023)   Hunger Vital Sign    Worried About Running Out of Food in the Last Year: Never true    Ran Out of Food in the Last Year: Never true  Transportation Needs: No Transportation Needs (08/01/2023)   PRAPARE - Administrator, Civil Service (Medical): No    Lack of Transportation (Non-Medical): No  Physical Activity: Sufficiently Active (02/15/2023)   Exercise Vital Sign    Days of Exercise per Week: 5 days    Minutes of Exercise per Session: 80 min  Stress: No Stress Concern Present (02/15/2023)   [employer] of Occupational Health - Occupational Stress Questionnaire    Feeling of Stress  : Not at all  Social Connections: Socially Integrated (07/27/2023)   Social Connection and Isolation Panel    Frequency of Communication with Friends and Family: More than three times a week    Frequency of Social Gatherings with Friends and Family: More than three times a week    Attends Religious Services: More than 4 times per year    Active Member of Clubs or Organizations: Yes    Attends  Club or Organization Meetings: More than 4 times per year    Marital Status: Married  Catering Manager Violence: Not At Risk (08/01/2023)   Humiliation, Afraid, Rape, and Kick questionnaire    Fear of Current or Ex-Partner: No    Emotionally Abused: No    Physically Abused: No    Sexually Abused: No    Review of Systems:  All other review of systems negative except as mentioned in the HPI.  Physical Exam: Vital signs BP 136/74   Pulse 70   Temp (!) 97.3 F (36.3 C) (Temporal)   Resp 13   Ht 5' 11.5 (1.816 m)   Wt 220 lb (99.8 kg)   SpO2 95%   BMI 30.26 kg/m   General:   Alert,  Well-developed, well-nourished, pleasant and cooperative in NAD Lungs:  Clear throughout to auscultation.   Heart:  Regular rate and rhythm; no murmurs, clicks, rubs,  or gallops. Abdomen:  Soft, nontender and nondistended. Normal bowel sounds.   Neuro/Psych:  Alert and cooperative. Normal mood and affect. A and O x 3   @Ayauna Mcnay  CHARLENA Commander, MD, [facility] [phone] (pager) 01/10/2024 8:07 AM@

## 2024-01-09 NOTE — Telephone Encounter (Signed)
 PA rcvd and approved Ref# [id number] 12/12/23-03/13/24 [facility] L5000

## 2024-01-10 ENCOUNTER — Ambulatory Visit: Admitting: Internal Medicine

## 2024-01-10 ENCOUNTER — Encounter: Payer: Self-pay | Admitting: Internal Medicine

## 2024-01-10 VITALS — BP 101/55 | HR 68 | Temp 97.3°F | Resp 14 | Ht 71.5 in | Wt 220.0 lb

## 2024-01-10 DIAGNOSIS — I10 Essential (primary) hypertension: Secondary | ICD-10-CM | POA: Diagnosis not present

## 2024-01-10 DIAGNOSIS — Z860101 Personal history of adenomatous and serrated colon polyps: Secondary | ICD-10-CM | POA: Diagnosis not present

## 2024-01-10 DIAGNOSIS — D120 Benign neoplasm of cecum: Secondary | ICD-10-CM | POA: Diagnosis not present

## 2024-01-10 DIAGNOSIS — Z860100 Personal history of colon polyps, unspecified: Secondary | ICD-10-CM

## 2024-01-10 DIAGNOSIS — Z1211 Encounter for screening for malignant neoplasm of colon: Secondary | ICD-10-CM | POA: Diagnosis not present

## 2024-01-10 DIAGNOSIS — K648 Other hemorrhoids: Secondary | ICD-10-CM | POA: Diagnosis not present

## 2024-01-10 DIAGNOSIS — I509 Heart failure, unspecified: Secondary | ICD-10-CM | POA: Diagnosis not present

## 2024-01-10 DIAGNOSIS — E119 Type 2 diabetes mellitus without complications: Secondary | ICD-10-CM | POA: Diagnosis not present

## 2024-01-10 MED ORDER — SODIUM CHLORIDE 0.9 % IV SOLN: 500.0000 mL | Freq: Once | INTRAVENOUS | Status: DC

## 2024-01-10 NOTE — Progress Notes (Signed)
 Pt's states no medical or surgical changes since previsit or office visit.

## 2024-01-10 NOTE — Progress Notes (Signed)
 Report given to PACU, vss

## 2024-01-10 NOTE — Patient Instructions (Addendum)
 I found and removed 1 tiny polyp that looks benign.  I will have that analyzed and let you know when to repeat colonoscopy.  I appreciate the opportunity to care for you. Lupita CHARLENA Commander, MD, FACG  YOU HAD AN ENDOSCOPIC PROCEDURE TODAY AT THE [facility]:   Refer to the procedure report that was given to you for any specific questions about what was found during the examination.  If the procedure report does not answer your questions, please call your gastroenterologist to clarify.  If you requested that your care partner not be given the details of your procedure findings, then the procedure report has been included in a sealed envelope for you to review at your convenience later.  YOU SHOULD EXPECT: Some feelings of bloating in the abdomen. Passage of more gas than usual.  Walking can help get rid of the air that was put into your GI tract during the procedure and reduce the bloating. If you had a lower endoscopy (such as a colonoscopy or flexible sigmoidoscopy) you may notice spotting of blood in your stool or on the toilet paper. If you underwent a bowel prep for your procedure, you may not have a normal bowel movement for a few days.  Please Note:  You might notice some irritation and congestion in your nose or some drainage.  This is from the oxygen used during your procedure.  There is no need for concern and it should clear up in a day or so.  SYMPTOMS TO REPORT IMMEDIATELY:  Following lower endoscopy (colonoscopy or flexible sigmoidoscopy):  Excessive amounts of blood in the stool  Significant tenderness or worsening of abdominal pains  Swelling of the abdomen that is new, acute  Fever of 100F or higher  For urgent or emergent issues, a gastroenterologist can be reached at any hour by calling [phone]. Do not use MyChart messaging for urgent concerns.    DIET:  We do recommend a small meal at first, but then you may proceed to your regular diet.  Drink plenty of  fluids but you should avoid alcoholic beverages for 24 hours.  ACTIVITY:  You should plan to take it easy for the rest of today and you should NOT DRIVE or use heavy machinery until tomorrow (because of the sedation medicines used during the test).    FOLLOW UP: Our staff will call the number listed on your records the next business day following your procedure.  We will call around 7:15- 8:00 am to check on you and address any questions or concerns that you may have regarding the information given to you following your procedure. If we do not reach you, we will leave a message.     If any biopsies were taken you will be contacted by phone or by letter within the next 1-3 weeks.  Please call us  at [phone] if you have not heard about the biopsies in 3 weeks.    SIGNATURES/CONFIDENTIALITY: You and/or your care partner have signed paperwork which will be entered into your electronic medical record.  These signatures attest to the fact that that the information above on your After Visit Summary has been reviewed and is understood.  Full responsibility of the confidentiality of this discharge information lies with you and/or your care-partner.

## 2024-01-10 NOTE — Op Note (Signed)
 [facility] Patient Name: Aaron Fox Procedure Date: 01/10/2024 7:51 AM MRN: [id number] Endoscopist: Lupita FORBES Commander , MD, [id number] Age: 68 Referring MD:  Date of Birth: Jul 25, 1955 Gender: Male Account #: [id number] Procedure:                Colonoscopy Indications:              High risk colon cancer surveillance: Personal                            history of colonic polyps, Last colonoscopy: 2020 Medicines:                Monitored Anesthesia Care Procedure:                Pre-Anesthesia Assessment:                           - Prior to the procedure, a History and Physical                            was performed, and patient medications and                            allergies were reviewed. The patient's tolerance of                            previous anesthesia was also reviewed. The risks                            and benefits of the procedure and the sedation                            options and risks were discussed with the patient.                            All questions were answered, and informed consent                            was obtained. Prior Anticoagulants: The patient has                            taken no anticoagulant or antiplatelet agents. ASA                            Grade Assessment: III - A patient with severe                            systemic disease. After reviewing the risks and                            benefits, the patient was deemed in satisfactory                            condition to undergo the procedure.  After obtaining informed consent, the colonoscope                            was passed under direct vision. Throughout the                            procedure, the patient's blood pressure, pulse, and                            oxygen saturations were monitored continuously. The                            Olympus Scope SN: [id number] was introduced through                            the  anus and advanced to the the cecum, identified                            by appendiceal orifice and ileocecal valve. The                            colonoscopy was somewhat difficult due to                            significant looping. Successful completion of the                            procedure was aided by straightening and shortening                            the scope to obtain bowel loop reduction and                            applying abdominal pressure. The patient tolerated                            the procedure well. The quality of the bowel                            preparation was good. The ileocecal valve,                            appendiceal orifice, and rectum were photographed.                            The bowel preparation used was Miralax via split                            dose instruction. Scope In: 8:13:04 AM Scope Out: 8:31:51 AM Scope Withdrawal Time: 0 hours 11 minutes 24 seconds  Total Procedure Duration: 0 hours 18 minutes 47 seconds  Findings:                 The perianal and digital rectal examinations were  normal. Pertinent negatives include normal prostate                            (size, shape, and consistency).                           A 4 mm polyp was found in the cecum. The polyp was                            sessile. The polyp was removed with a cold snare.                            Resection and retrieval were complete. Verification                            of patient identification for the specimen was                            done. Estimated blood loss was minimal.                           Internal hemorrhoids were found.                           The exam was otherwise without abnormality on                            direct and retroflexion views. Complications:            No immediate complications. Estimated Blood Loss:     Estimated blood loss was minimal. Impression:               - One 4 mm  polyp in the cecum, removed with a cold                            snare. Resected and retrieved.                           - Internal hemorrhoids.                           - The examination was otherwise normal on direct                            and retroflexion views.                           - Personal history of colonic polyps.2017 - 4                            adenomas max 7 mm, 2020 no polyps Recommendation:           - Patient has a contact number available for                            emergencies. The  signs and symptoms of potential                            delayed complications were discussed with the                            patient. Return to normal activities tomorrow.                            Written discharge instructions were provided to the                            patient.                           - Resume previous diet.                           - Continue present medications.                           - Repeat colonoscopy is recommended for                            surveillance. The colonoscopy date will be                            determined after pathology results from today's                            exam become available for review. Lupita FORBES Commander, MD 01/10/2024 8:40:52 AM This report has been signed electronically.

## 2024-01-11 ENCOUNTER — Telehealth: Payer: Self-pay | Admitting: *Deleted

## 2024-01-11 NOTE — Telephone Encounter (Signed)
  Follow up Call-     01/10/2024    7:22 AM  Call back number  Post procedure Call Back phone  # [phone]  Permission to leave phone message Yes     Patient questions:  Do you have a fever, pain , or abdominal swelling? No. Pain Score  0 *  Have you tolerated food without any problems? Yes.    Have you been able to return to your normal activities? Yes.    Do you have any questions about your discharge instructions: Diet   No. Medications  No. Follow up visit  No.  Do you have questions or concerns about your Care? No.  Actions: * If pain score is 4 or above: No action needed, pain <4.

## 2024-01-14 LAB — SURGICAL PATHOLOGY

## 2024-01-15 NOTE — Progress Notes (Unsigned)
 Referring Physician:  Duanne Butler DASEN, MD [address] [place],  [place] [address]  Primary Physician:  Duanne Butler DASEN, MD  History of Present Illness: 01/16/2024 [name] is here today with a chief complaint of worsening neck pain that increased while trying to work under his sink approximately 6 months ago.  The pain extends from his neck down his left arm into all 5 of his fingers.  He does have some pain and tingling also in the setting of chronic neuropathy however denies any weakness.  In regards to his neck pain he feels as though his walking has changed somewhat, but is not sure if this is secondary to his baseline knee pain.  Denies any new saddle esthesia or incontinence to bowel or bladder function.        Bowel/Bladder Dysfunction: none  Conservative measures:  Physical therapy:  has not participated in Multimodal medical therapy including regular antiinflammatories:  Prednisone , Gabapentin  Injections: no epidural steroid injections  Past Surgery: none   The symptoms are causing a significant impact on the patient's life.   Review of Systems:  A 10 point review of systems is negative, except for the pertinent positives and negatives detailed in the HPI.  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   Lymphadenopathy    Peripheral neuropathy    PUD (peptic ulcer disease)    Rheumatoid arthritis(714.0)  09/22/2006    Past Surgical History: Past Surgical History:  Procedure Laterality Date   ACHILLES TENDON SURGERY Right 04/25/2019   Procedure: ACHILLES LENGTHENING;  Surgeon: Gershon Donnice SAUNDERS, DPM;  Location: [facility];  Service: Podiatry;  Laterality: Right;   AMPUTATION Right 04/25/2019   Procedure: AMPUTATION FOOT,TRANSMETATARSAL;  Surgeon: Gershon Donnice SAUNDERS, DPM;  Location: [facility];  Service: Podiatry;  Laterality: Right;   AMPUTATION TOE Left 07/30/2023   Procedure: AMPUTATION, TOE;  Surgeon: Malvin Marsa FALCON, DPM;  Location: [facility];  Service: Orthopedics/Podiatry;  Laterality: Left;  LEFT GREAT TOE AMPUTAION   AMPUTATION TOE Left 09/26/2023   Procedure: AMPUTATION,  SECOND TOE;  Surgeon: Malvin Marsa FALCON, DPM;  Location: [facility];  Service: Orthopedics/Podiatry;  Laterality: Left;  2nd toe   CATARACT EXTRACTION Bilateral    Dr. Lavonia   CATARACT EXTRACTION, BILATERAL     COLONOSCOPY  2004   LYMPH NODE BIOPSY Right 10/16/2014   Procedure: RIGHT INGUINAL LYMPH NODE BIOPSY;  Surgeon: Oneil Budge, MD;  Location: [facility];  Service: General;  Laterality: Right;    Allergies: Allergies as of 01/16/2024 - Review Complete 01/16/2024  Allergen Reaction Noted   Penicillins Swelling and Other (See Comments)    Lamisil [terbinafine] Hives    Cipro  [ciprofloxacin  hcl] Hives and Rash    Zestril [lisinopril] Cough 07/24/2013    Medications: Outpatient Encounter Medications as of 01/16/2024  Medication Sig   Accu-Chek Softclix Lancets lancets TEST BLOOD  SUGAR 7 TIMES DAILY   aspirin  81 MG tablet Take 81 mg by mouth in the morning.   atorvastatin  (LIPITOR) 10 MG tablet TAKE 1 TABLET EVERY DAY   gabapentin  (NEURONTIN ) 300 MG capsule TAKE 2 CAPSULES THREE TIMES DAILY   glucose blood (ACCU-CHEK GUIDE TEST) test strip Use as instructed to monitor glucose 4 times daily   insulin  isophane & regular human KwikPen (NOVOLIN  70/30 KWIKPEN) (70-30) 100 UNIT/ML KwikPen Inject 10 Units into the skin  2 (two) times daily before a meal.   INSULIN  SYRINGE 1CC/29G (DROPLET INSULIN  SYRINGE) 29G X 1/2 1 ML MISC Use to inject in morning and bedtime   losartan  (COZAAR ) 50 MG tablet TAKE 1 TABLET EVERY DAY   Tetrahydrozoline HCl (VISINE OP) Place 1 drop into both eyes daily as needed (redness).   triamcinolone  cream (KENALOG ) 0.1 % APPLY TO THE AFFECTED AREA(S) TOPICALLY THREE TIMES DAILY AS NEEDED FOR RASH   No facility-administered encounter medications on file as of 01/16/2024.    Social History: Social History   Tobacco Use   Smoking status: Former    Current packs/day: 0.00    Types: Cigarettes    Quit date: 11/02/1999    Years since quitting: 24.2   Smokeless tobacco: Never  Vaping Use   Vaping status: Never Used  Substance Use Topics   Alcohol use: No    Alcohol/week: 0.0 standard drinks of alcohol   Drug use: No    Family Medical History: Family History  Problem Relation Age of Onset   Heart disease Other        FH of CAD   Colon polyps Paternal Uncle    Kidney disease Paternal Uncle    Diabetes Paternal Grandfather    Diabetes Maternal Uncle    Cancer Neg Hx    Colon cancer Neg Hx    Gallbladder disease Neg Hx    Esophageal cancer Neg Hx    Rectal cancer Neg Hx    Stomach cancer Neg Hx     Physical Examination: @VITALWITHPAIN @  General: Patient is well developed, well nourished, calm, collected, and in no apparent distress. Attention to examination is appropriate.  Psychiatric: Patient is non-anxious.  Head:  Pupils equal, round, and reactive to light.  ENT:  Oral mucosa appears well hydrated.  Neck:   Supple.    Respiratory: Patient is breathing without any difficulty.  Extremities: No edema.  Vascular: Palpable dorsal pedal pulses.  Skin:   On exposed skin, there are no abnormal skin lesions.  NEUROLOGICAL:     Awake, alert, oriented to person, place, and time.  Speech is clear and fluent. Fund of knowledge is appropriate.   Cranial Nerves:  Pupils equal round and reactive to light.  Facial tone is symmetric.   ROM of spine: Minimal tenderness palpation of cervical paraspinals.  Positive Spurling's.     Strength: Side Biceps Triceps Deltoid Interossei Grip Wrist Ext. Wrist Flex.  R 5 5 5 5 5 5 5   L 4+ 4+ 5 5 5 5 5    Patient is diffusely hyporeflexic in his upper extremities   Hoffman's is absent.    Medical Decision Making  Imaging: EXAM: MRI CERVICAL SPINE WITHOUT CONTRAST   TECHNIQUE: Multiplanar, multisequence MR imaging of the cervical spine was performed. No intravenous contrast was administered.   COMPARISON:  09/30/2006   FINDINGS: Despite efforts by the technologist and patient, motion artifact is present on today's exam and could not be eliminated. This reduces exam sensitivity and specificity.  Alignment: No vertebral subluxation is observed.   Vertebrae: Type 1 degenerative endplate findings at C3-4 with Schmorl's node along the right superior endplate of C4.   Disc desiccation throughout the cervical spine with loss of disc height at C3-4 and especially at C4-5 where there is thought to be acquired interbody fusion.   Cord: No significant abnormal spinal cord signal is observed.   Posterior Fossa, vertebral arteries, paraspinal tissues: Unremarkable   Disc levels:   C2-3: Moderate bilateral foraminal stenosis due to uncinate spurring and disc bulge.   C3-4: Prominent left and moderate right foraminal stenosis with moderate central stenosis due to disc bulge, uncinate spurring, and facet arthropathy.   C4-5: Moderate central stenosis with moderate left foraminal stenosis due to disc bulge, central disc protrusion, and uncinate spurring.   C5-6: Borderline bilateral foraminal stenosis due to intervertebral and uncinate spurring.   C6-7: Borderline bilateral foraminal stenosis due to disc bulge and uncinate spurring.   C7-T1: Unremarkable   IMPRESSION: 1. Cervical spondylosis  and degenerative disc disease, causing prominent impingement at C3-4; moderate impingement at C2-3 and C4-5; and borderline impingement at C5-6 and C6-7.  I have personally reviewed the images and agree with the above interpretation.  Assessment and Plan: Mr. Wrede is a pleasant 68 y.o. male with worsening neck pain that increased while trying to work under his sink approximately 6 months ago.  The pain extends from his neck down his left arm into all 5 of his fingers.  He does have some pain and tingling also in the setting of chronic neuropathy however denies any weakness.  On his MRI he does have cervical spondylosis and degenerative disc disease with impingement extending from C2-C5.  On examination, positive Spurling's.  Plan includes the following moving forward:  -Increase gabapentin  from 600 mg 3 times a day to 900 mg twice a day and 600 mg once a day. - Referral to physical therapy has been placed.  Have advised the patient to let me know if he has any trouble with scheduling. - Patient is interested in injections for his pain.  I placed a referral for this as well. - 4 view films of cervical x-rays today including flexion and extension to evaluate for listhesis. - See back with surgeon in approximately 8 weeks after physical therapy has been completed.  Thank you for involving me in the care of this patient.    Lyle Decamp, PA-C Dept. of Neurosurgery

## 2024-01-16 ENCOUNTER — Ambulatory Visit

## 2024-01-16 ENCOUNTER — Encounter: Payer: Self-pay | Admitting: Physician Assistant

## 2024-01-16 ENCOUNTER — Ambulatory Visit: Admitting: Physician Assistant

## 2024-01-16 VITALS — BP 148/82 | Ht 71.5 in | Wt 233.0 lb

## 2024-01-16 DIAGNOSIS — M5031 Other cervical disc degeneration,  high cervical region: Secondary | ICD-10-CM | POA: Diagnosis not present

## 2024-01-16 DIAGNOSIS — S13150A Subluxation of C4/C5 cervical vertebrae, initial encounter: Secondary | ICD-10-CM | POA: Diagnosis not present

## 2024-01-16 DIAGNOSIS — M47812 Spondylosis without myelopathy or radiculopathy, cervical region: Secondary | ICD-10-CM

## 2024-01-16 DIAGNOSIS — M4802 Spinal stenosis, cervical region: Secondary | ICD-10-CM | POA: Diagnosis not present

## 2024-01-16 MED ORDER — GABAPENTIN 300 MG PO CAPS: ORAL_CAPSULE | ORAL | 2 refills | Status: AC

## 2024-01-20 ENCOUNTER — Ambulatory Visit: Payer: Self-pay | Admitting: Internal Medicine

## 2024-01-22 ENCOUNTER — Ambulatory Visit: Admitting: Physician Assistant

## 2024-01-22 NOTE — Therapy (Signed)
 OUTPATIENT PHYSICAL THERAPY CERVICAL EVALUATION   Patient Name: Aaron Fox MRN: [id number] DOB:01-25-1956, 68 y.o., male Today's Date: 01/23/2024  END OF SESSION:  PT End of Session - 01/23/24 0730     Visit Number 1    Number of Visits 6    Date for Recertification  03/05/24    Authorization Type [employer]    Authorization Time Period please check auth    PT Start Time 0729    PT Stop Time 0810    PT Time Calculation (min) 41 min    Activity Tolerance Patient tolerated treatment well    Behavior During Therapy [facility] for tasks assessed/performed          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   Lymphadenopathy    Peripheral neuropathy    PUD (peptic ulcer disease)    Rheumatoid arthritis(714.0) 09/22/2006   Past Surgical History:  Procedure Laterality Date   ACHILLES TENDON SURGERY Right 04/25/2019   Procedure: ACHILLES LENGTHENING;  Surgeon: Gershon Donnice SAUNDERS, DPM;  Location: [facility];  Service: Podiatry;  Laterality: Right;   AMPUTATION Right 04/25/2019   Procedure: AMPUTATION FOOT,TRANSMETATARSAL;  Surgeon: Gershon Donnice SAUNDERS, DPM;  Location: [facility];  Service: Podiatry;  Laterality: Right;   AMPUTATION TOE Left 07/30/2023   Procedure: AMPUTATION, TOE;  Surgeon: Malvin Marsa FALCON, DPM;  Location[facility];  Service: Orthopedics/Podiatry;  Laterality: Left;  LEFT GREAT TOE AMPUTAION   AMPUTATION TOE Left 09/26/2023   Procedure: AMPUTATION,   SECOND TOE;  Surgeon: Malvin Marsa FALCON, DPM;  Location: [facility];  Service: Orthopedics/Podiatry;  Laterality: Left;  2nd toe   CATARACT EXTRACTION Bilateral    Dr. Lavonia   CATARACT EXTRACTION, BILATERAL     COLONOSCOPY  2004   LYMPH NODE BIOPSY Right 10/16/2014   Procedure: RIGHT INGUINAL LYMPH NODE BIOPSY;  Surgeon: Oneil Budge, MD;  Location: [facility];  Service: General;  Laterality: Right;   Patient Active Problem List   Diagnosis Date Noted   History of amputation of left great toe 08/02/2023   Osteomyelitis of great toe of left foot (HCC) 07/27/2023   Osteomyelitis (HCC) 04/25/2019   Diabetic retinopathy of both eyes (HCC)    Low back pain 12/17/2017   Irritant contact dermatitis 09/21/2016   Cervical radiculitis 02/08/2016   Hx of adenomatous colonic polyps 06/09/2015   Dyspnea on exertion 05/03/2015   Lymphadenopathy, retroperitoneal 09/17/2014   PSA elevation 05/27/2014   Iron deficiency anemia 05/27/2014   Wellness examination 04/20/2014   Diabetic foot ulcer (HCC) 11/21/2013   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   Piriformis syndrome of left side 08/25/2013   Dyspnea 07/24/2013   Knee pain 07/24/2013   Routine general medical examination at a health care facility 07/24/2013   Numbness and tingling in both hands 10/14/2012   Encounter for long-term (current) use of other medications 11/13/2011  Disturbance of skin sensation 11/13/2011   Screening for prostate cancer 11/13/2011   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   Shoulder pain, right 11/13/2011   BALANITIS 09/01/2009   HYPOKALEMIA 05/31/2009   Essential hypertension 05/31/2009   LEG PAIN, BILATERAL 02/15/2009   HYPERCHOLESTEROLEMIA 07/02/2008   ALLERGIC RHINITIS 07/02/2008   CHF 06/19/2008   MICROSCOPIC HEMATURIA 06/19/2008   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   Hypogonadism male 01/15/2007   Diabetes (HCC) 09/22/2006   Gout 09/22/2006   RHEUMATOID  ARTHRITIS 09/22/2006    PCP: Duanne Butler DASEN, MD  REFERRING PROVIDER: Ulis Bottcher, PA-C  REFERRING DIAG:  Diagnosis  M48.02 (ICD-10-CM) - Spinal stenosis in cervical region    THERAPY DIAG:  Cervical stenosis of spine  Radiculopathy, cervical region  Neck pain  Rationale for Evaluation and Treatment: Rehabilitation  ONSET DATE: a year or so but getting worse lately  SUBJECTIVE:                                                                                                                                                                                                         SUBJECTIVE STATEMENT: Pain from left side of the neck down into the arm and hand sometimes up to left ear; sometimes into the right arm; going on about a year; worse lately; had one episode where felt like a lighting bolt went up my left arm to my neck and into my right arm; so decided to get it checked out.  Will be seeing about getting some injections in his neck 12/16 Hand dominance: Right  PERTINENT HISTORY:  DM; neuropathy In feet  PAIN:  Are you having pain? Yes: NPRS scale: 8/10 now; 2/10 at best; up to 10/10 Pain location: left side of neck Pain description: tingling, shooting and sharp Aggravating factors: unknown Relieving factors: put hand on top of the head  PRECAUTIONS: None  RED FLAGS: None     WEIGHT BEARING RESTRICTIONS: No  FALLS:  Has patient fallen in last 6 months? No   OCCUPATION: retired but very activearts development officer care  PLOF: Independent  PATIENT GOALS: get out of feeling so bad and hurting  NEXT MD VISIT: 02/12/24 see MD about injections  OBJECTIVE:  Note: Objective measures were completed at Evaluation unless otherwise noted.  DIAGNOSTIC FINDINGS:  MPRESSION: 1. Mild anterior subluxation of C4 on C5 on the flexion view, possibly degenerative or indicative of ligamentous injury. No subluxation on neutral or extension views. 2. Degenerative changes with  narrowing and fusion at C5-C6 and osteophyte formation at C4-C5 and C6-C7 levels.  Electronically signed by: Elsie Gravely MD 01/20/2024 07:09 PM EST RP Workstation: [id number]  IMPRESSION: 1. Cervical spondylosis and degenerative disc disease, causing prominent impingement at C3-4; moderate impingement at C2-3 and C4-5; and borderline impingement at C5-6 and C6-7.     Electronically Signed   By: Ryan Salvage M.D.   On: 12/13/2023 15:55  PATIENT SURVEYS:  NDI:  NECK DISABILITY INDEX  Date: 01/23/2024 Score                                Total 14/50; 28%   Minimum Detectable Change (90% confidence): 5 points or 10% points  COGNITION: Overall cognitive status: Within functional limits for tasks assessed  SENSATION: Tingling down left arm  POSTURE: rounded shoulders and forward head  PALPATION: Tender bilat upper traps left > right   CERVICAL ROM: * pain  Active ROM AROM (deg) eval  Flexion 31  Extension 48*  Right lateral flexion 30*  Left lateral flexion 38  Right rotation 57  Left rotation 55*   (Blank rows = not tested)  UPPER EXTREMITY ROM:  Active ROM Right eval Left eval  Shoulder flexion    Shoulder extension    Shoulder abduction    Shoulder adduction    Shoulder extension    Shoulder internal rotation    Shoulder external rotation    Elbow flexion    Elbow extension    Wrist flexion    Wrist extension    Wrist ulnar deviation    Wrist radial deviation    Wrist pronation    Wrist supination     (Blank rows = not tested)  UPPER EXTREMITY MMT:  MMT Right eval Left eval  Shoulder flexion 5 4+  Shoulder extension    Shoulder abduction    Shoulder adduction    Shoulder extension    Shoulder internal rotation 5 4+  Shoulder external rotation 5 4  Middle trapezius    Lower trapezius    Elbow flexion 5 4+  Elbow extension 5 4+  Wrist flexion    Wrist extension    Wrist ulnar deviation    Wrist radial deviation     Wrist pronation    Wrist supination    Grip strength     (Blank rows = not tested)  CERVICAL SPECIAL TESTS:  Distraction test: Negative  FUNCTIONAL TESTS:  5 times sit to stand: next visit  TREATMENT DATE: 01/23/24 physical therapy evaluation and HEP instruction; supine manual traction 10 hold x 10                                                                                                                                PATIENT EDUCATION:  Education details: Patient educated on exam findings, POC, scope of PT, HEP, and what to expect next visit. Person educated: Patient Education method: Explanation, Demonstration, and Handouts Education comprehension: verbalized understanding, returned  demonstration, verbal cues required, and tactile cues required  HOME EXERCISE PROGRAM: 01/23/24 Decompression position and shoulder press 5 hold x 10  ASSESSMENT:  CLINICAL IMPRESSION: Patient is a 68 y.o. male who was seen today for physical therapy evaluation and treatment for M48.02 (ICD-10-CM) - Spinal stenosis in cervical region. Patient demonstrates decreased strength, ROM restriction, reduced flexibility, increased tenderness to palpation and postural abnormalities which are likely contributing to symptoms of pain and are negatively impacting patient ability to perform ADLs. Patient will benefit from skilled physical therapy services to address these deficits to reduce pain and improve level of function with ADLs  .   OBJECTIVE IMPAIRMENTS: decreased activity tolerance, decreased ROM, decreased strength, impaired perceived functional ability, and pain.   ACTIVITY LIMITATIONS: carrying, lifting, sleeping, reach over head, and hygiene/grooming  PARTICIPATION LIMITATIONS: meal prep, cleaning, occupation, and yard work  PERSONAL FACTORS: 1 comorbidity: DM are also affecting patient's functional outcome.   REHAB POTENTIAL: Good  CLINICAL DECISION MAKING: Evolving/moderate  complexity  EVALUATION COMPLEXITY: Moderate   GOALS: Goals reviewed with patient? No  SHORT TERM GOALS: Target date: 02/03/2024  patient will be independent with initial HEP and compliant with HEP 3-4 times a week   Baseline:  Goal status: INITIAL  2.  Patient will report 50% improvement overall  Baseline:  Goal status: INITIAL   LONG TERM GOALS: Target date: 03/06/2023  Patient will be independent in self management strategies to improve quality of life and functional outcomes.  Baseline:  Goal status: INITIAL  2.  Patient will report 80% improvement overall  Baseline:  Goal status: INITIAL  3.  Patient will increase cervical mobility by 20 degrees throughout to improve ability to scan for safety with driving  Baseline: see above Goal status: INITIAL  4.   Patient will increase left arm MMT's to 5/5 to allow working in the yard and around the house without any UE limitation with reaching and lifting activity  Baseline:  Goal status: INITIAL  5.  Patient will improve NDI score by 5  points to demonstrate improved perceived function  Baseline: 14/50 Goal status: INITIAL   PLAN:  PT FREQUENCY: 1x/week  PT DURATION: 6 weeks  PLANNED INTERVENTIONS: 97164- PT Re-evaluation, 97110-Therapeutic exercises, 97530- Therapeutic activity, 97112- Neuromuscular re-education, 97535- Self Care, 02859- Manual therapy, [id number]- Gait training, [phone]- Orthotic Fit/training, [phone]- Canalith repositioning, [id number]- Aquatic Therapy, [phone]- Splinting, [phone]- Wound care (first 20 sq cm), 97598- Wound care (each additional 20 sq cm)Patient/Family education, Balance training, Stair training, Taping, Dry Needling, Joint mobilization, Joint manipulation, Spinal manipulation, Spinal mobilization, Scar mobilization, and DME instructions.   PLAN FOR NEXT SESSION: Review HEP and goals; manual traction, postural strengthening, decompression exercises; discuss possible home traction if good  response to manual traction  8:05 AM, 01/23/24 Jahmarion Popoff Small Chyna Kneece MPT [facility] [place] [phone] [phone]    [employer] Auth Request Treatment Start Date: 01/23/2024  Referring diagnosis code (ICD 10)? M48.02 Treatment diagnosis codes (ICD 10)? (if different than referring diagnosis) M54.12, M54.2 What was this (referring dx) caused by? []  Surgery []  Fall [x]  Ongoing issue []  Arthritis []  Other: ____________  Laterality: []  Rt []  Lt [x]  Both  Deficits: [x]  Pain [x]  Stiffness [x]  Weakness []  Edema []  Balance Deficits []  Coordination []  Gait Disturbance [x]  ROM []  Other   Functional Tool Score: NDI 14/50; 28%  CPT codes: See Planned Interventions listed in the Plan section of the Evaluation.

## 2024-01-23 ENCOUNTER — Ambulatory Visit (HOSPITAL_COMMUNITY): Attending: Physician Assistant

## 2024-01-23 ENCOUNTER — Other Ambulatory Visit: Payer: Self-pay | Admitting: Family Medicine

## 2024-01-23 ENCOUNTER — Other Ambulatory Visit: Payer: Self-pay

## 2024-01-23 DIAGNOSIS — M5412 Radiculopathy, cervical region: Secondary | ICD-10-CM | POA: Insufficient documentation

## 2024-01-23 DIAGNOSIS — M542 Cervicalgia: Secondary | ICD-10-CM | POA: Diagnosis not present

## 2024-01-23 DIAGNOSIS — M4802 Spinal stenosis, cervical region: Secondary | ICD-10-CM | POA: Insufficient documentation

## 2024-01-23 NOTE — Telephone Encounter (Signed)
 Copied from CRM #[id number]. Topic: Clinical - Medication Refill >> Jan 23, 2024  1:39 PM Shanda MATSU wrote: Medication: losartan  (COZAAR ) 50 MG tablet  Has the patient contacted their pharmacy? Yes, referred to provider (Agent: If no, request that the patient contact the pharmacy for the refill. If patient does not wish to contact the pharmacy document the reason why and proceed with request.) (Agent: If yes, when and what did the pharmacy advise?)  This is the patient's preferred pharmacy:  [facility] [address], [place] - [address] [place] [address] Phone: [phone] Fax: [phone]    Is this the correct pharmacy for this prescription? Yes If no, delete pharmacy and type the correct one.   Has the prescription been filled recently? No  Is the patient out of the medication? Yes  Has the patient been seen for an appointment in the last year OR does the patient have an upcoming appointment? Yes  Can we respond through MyChart? Yes  Agent: Please be advised that Rx refills may take up to 3 business days. We ask that you follow-up with your pharmacy.

## 2024-01-28 ENCOUNTER — Telehealth: Payer: Self-pay

## 2024-01-28 ENCOUNTER — Other Ambulatory Visit: Payer: Self-pay | Admitting: Family Medicine

## 2024-01-28 NOTE — Telephone Encounter (Signed)
 Prescription Request  01/28/2024  LOV: 12/07/23  What is the name of the medication or equipment? atorvastatin  (LIPITOR) 10 MG tablet [[id number]]   Have you contacted your pharmacy to request a refill? Yes   Which pharmacy would you like this sent to?  [facility] - [place], [place] - [address] [place] [address] Phone: [phone] Fax: [phone]    Patient notified that their request is being sent to the clinical staff for review and that they should receive a response within 2 business days.   Please advise at [facility] [phone]

## 2024-01-29 MED ORDER — LOSARTAN POTASSIUM 50 MG PO TABS: 50.0000 mg | ORAL_TABLET | Freq: Every day | ORAL | 0 refills | Status: AC

## 2024-01-29 NOTE — Telephone Encounter (Signed)
 Requested Prescriptions  Pending Prescriptions Disp Refills   losartan  (COZAAR ) 50 MG tablet 90 tablet 0    Sig: Take 1 tablet (50 mg total) by mouth daily.     Cardiovascular:  Angiotensin Receptor Blockers Failed - 01/29/2024  9:03 AM      Failed - Last BP in normal range    BP Readings from Last 1 Encounters:  01/16/24 (!) 148/82         Passed - Cr in normal range and within 180 days    Creat  Date Value Ref Range Status  10/19/2023 1.16 0.70 - 1.35 mg/dL Final   Creatinine,U  Date Value Ref Range Status  02/08/2009 155.7 mg/dL Final   Creatinine, Urine  Date Value Ref Range Status  10/19/2023 198 20 - 320 mg/dL Final         Passed - K in normal range and within 180 days    Potassium  Date Value Ref Range Status  10/19/2023 3.8 3.5 - 5.3 mmol/L Final         Passed - Patient is not pregnant      Passed - Valid encounter within last 6 months    Recent Outpatient Visits           1 month ago Chronic pain of right knee   [place] [facility] Duanne Butler DASEN, MD   2 months ago Cervical radiculopathy   [place] [facility] Duanne Butler DASEN, MD   3 months ago Type 2 diabetes mellitus with retinopathy, with long-term current use of insulin , macular edema presence unspecified, unspecified laterality, unspecified retinopathy severity [facility])   [place] [facility] Duanne Butler DASEN, MD   6 months ago History of amputation of left great toe   [place] [facility] Kayla Jeoffrey RAMAN, FNP   7 months ago Cellulitis of left upper extremity   [place] [facility] Aletha Bene, MD

## 2024-01-31 NOTE — Telephone Encounter (Signed)
 Rx 07/30/23 #90 3RF- too soon Requested Prescriptions  Pending Prescriptions Disp Refills   atorvastatin  (LIPITOR) 10 MG tablet 90 tablet 3    Sig: Take 1 tablet (10 mg total) by mouth daily.     Cardiovascular:  Antilipid - Statins Failed - 01/31/2024  2:42 PM      Failed - Lipid Panel in normal range within the last 12 months    Cholesterol, Total  Date Value Ref Range Status  09/06/2023 120 100 - 199 mg/dL Final   Cholesterol  Date Value Ref Range Status  10/19/2023 127 <200 mg/dL Final   LDL Cholesterol (Calc)  Date Value Ref Range Status  10/19/2023 68 mg/dL (calc) Final    Comment:    Reference range: <100 . Desirable range <100 mg/dL for primary prevention;   <70 mg/dL for patients with CHD or diabetic patients  with > or = 2 CHD risk factors. SABRA LDL-C is now calculated using the Martin-Hopkins  calculation, which is a validated novel method providing  better accuracy than the Friedewald equation in the  estimation of LDL-C.  Gladis APPLETHWAITE et al. SANDREA. 7986;689(80): 2061-2068  (http://education.[employer].com/faq/FAQ164)    Direct LDL  Date Value Ref Range Status  04/04/2013 120.2 mg/dL Final    Comment:    Optimal:  <100 mg/dLNear or Above Optimal:  100-129 mg/dLBorderline High:  130-159 mg/dLHigh:  160-189 mg/dLVery High:  >190 mg/dL   HDL  Date Value Ref Range Status  10/19/2023 41 > OR = 40 mg/dL Final  92/89/7974 45 >60 mg/dL Final   Triglycerides  Date Value Ref Range Status  10/19/2023 94 <150 mg/dL Final         Passed - Patient is not pregnant      Passed - Valid encounter within last 12 months    Recent Outpatient Visits           1 month ago Chronic pain of right knee   [place] [facility] Duanne Butler DASEN, MD   2 months ago Cervical radiculopathy   [place] [facility] Duanne Butler DASEN, MD   3 months ago Type 2 diabetes mellitus with retinopathy, with long-term current use of insulin , macular  edema presence unspecified, unspecified laterality, unspecified retinopathy severity [facility])   [place] [facility] Duanne Butler DASEN, MD   6 months ago History of amputation of left great toe   [place] [facility] Medicine Kayla Jeoffrey RAMAN, FNP   7 months ago Cellulitis of left upper extremity   [place] [facility] Aletha Bene, MD

## 2024-02-01 ENCOUNTER — Encounter: Payer: Self-pay | Admitting: Podiatry

## 2024-02-01 ENCOUNTER — Ambulatory Visit (INDEPENDENT_AMBULATORY_CARE_PROVIDER_SITE_OTHER): Admitting: Podiatry

## 2024-02-01 DIAGNOSIS — Z89412 Acquired absence of left great toe: Secondary | ICD-10-CM

## 2024-02-01 DIAGNOSIS — B351 Tinea unguium: Secondary | ICD-10-CM

## 2024-02-01 DIAGNOSIS — L84 Corns and callosities: Secondary | ICD-10-CM

## 2024-02-01 DIAGNOSIS — M79675 Pain in left toe(s): Secondary | ICD-10-CM

## 2024-02-01 DIAGNOSIS — E1149 Type 2 diabetes mellitus with other diabetic neurological complication: Secondary | ICD-10-CM

## 2024-02-01 DIAGNOSIS — Z89422 Acquired absence of other left toe(s): Secondary | ICD-10-CM

## 2024-02-01 DIAGNOSIS — Z89439 Acquired absence of unspecified foot: Secondary | ICD-10-CM | POA: Diagnosis not present

## 2024-02-01 DIAGNOSIS — M2042 Other hammer toe(s) (acquired), left foot: Secondary | ICD-10-CM

## 2024-02-01 NOTE — Patient Instructions (Signed)
 See written instructions for shoe break-in. If you notice any irritation, wounds, or issues with the shoes stop wearing them and let us  know immediately.

## 2024-02-01 NOTE — Progress Notes (Addendum)
  Subjective:  Patient ID: Aaron Fox, male    DOB: [birth year],  MRN: [id number]  Chief Complaint  Patient presents with   Diabetes    [facility] toes 1 & 2 amputation and right foot. IDDM A1C 6.9 Toenail trim left foot toes 3-5.    68 y.o. male seen today for follow-up evaluation status post left complications.  He has a history of right transmetatarsal amputations.  He does not report any open lesions, ulcerations and no injuries.  His third toe is turned but no injuries or open wounds.  He also presents today for Diabetic shoes, inserts.    Objective:   Constitutional Well developed. Well nourished.  Vascular Foot warm and well perfused. Capillary refill normal to all digits.   No calf pain with palpation  Neurologic Normal speech. Oriented to person, place, and time. Epicritic sensation diminished  Dermatologic Incisions are well-healed.  There is no open lesion identified.  There is a small preulcerative callus noted on the distal medial aspect of the right TMA site without any underlying ulceration, drainage or signs of infection.  There are no open lesions bilaterally.  The nails on the left foot 3 through 5 are hypertrophic, dystrophic with yellow, brown discoloration.  No signs of infection at this time.   Orthopedic: Status post partial left second toe amputation through proximal phalanx head, prior hallux amputation; right TMA    Assessment:     Plan:  Patient was evaluated and treated and all questions answered.  - Sharply debrided hyperkeratotic lesion x 1 without any complications or bleeding.  Discussed moisturizer, offloading. -Serpe debrided nails x 3 on left foot without any complications or bleeding. -Diabetic shoes with inserts/toe fillers were dispensed.  Oral and written break-in instructions were provided to the patient.  Return in about 3 months (around 05/01/2024) for diabetic foot exam.  Donnice JONELLE Fees DPM

## 2024-02-07 ENCOUNTER — Ambulatory Visit: Admitting: Podiatry

## 2024-02-08 ENCOUNTER — Other Ambulatory Visit: Payer: Self-pay | Admitting: Family Medicine

## 2024-02-08 NOTE — Telephone Encounter (Unsigned)
 Copied from CRM #[id number]. Topic: Clinical - Medication Refill >> Feb 08, 2024 11:52 AM Jasmin G wrote: Medication: losartan  (COZAAR ) 50 MG tablet. Pt requested for a 90 day supply.   Has the patient contacted their pharmacy? Yes (Agent: If no, request that the patient contact the pharmacy for the refill. If patient does not wish to contact the pharmacy document the reason why and proceed with request.) (Agent: If yes, when and what did the pharmacy advise?)  This is the patient's preferred pharmacy:  [facility] - [place], [place] - [address] [place] [address] Phone: [phone] Fax: [phone]  Is this the correct pharmacy for this prescription? Yes If no, delete pharmacy and type the correct one.   Has the prescription been filled recently? Yes  Is the patient out of the medication? No  Has the patient been seen for an appointment in the last year OR does the patient have an upcoming appointment? Yes  Can we respond through MyChart? No  Agent: Please be advised that Rx refills may take up to 3 business days. We ask that you follow-up with your pharmacy.

## 2024-02-11 LAB — HM DIABETES EYE EXAM

## 2024-02-11 NOTE — Telephone Encounter (Signed)
 Duplicate request, refilled 01/29/24.  Requested Prescriptions  Pending Prescriptions Disp Refills   losartan  (COZAAR ) 50 MG tablet 90 tablet 0    Sig: Take 1 tablet (50 mg total) by mouth daily.     Cardiovascular:  Angiotensin Receptor Blockers Failed - 02/11/2024  3:45 PM      Failed - Last BP in normal range    BP Readings from Last 1 Encounters:  01/16/24 (!) 148/82         Passed - Cr in normal range and within 180 days    Creat  Date Value Ref Range Status  10/19/2023 1.16 0.70 - 1.35 mg/dL Final   Creatinine,U  Date Value Ref Range Status  02/08/2009 155.7 mg/dL Final   Creatinine, Urine  Date Value Ref Range Status  10/19/2023 198 20 - 320 mg/dL Final         Passed - K in normal range and within 180 days    Potassium  Date Value Ref Range Status  10/19/2023 3.8 3.5 - 5.3 mmol/L Final         Passed - Patient is not pregnant      Passed - Valid encounter within last 6 months    Recent Outpatient Visits           2 months ago Chronic pain of right knee   [place] [facility] Duanne Butler DASEN, MD   3 months ago Cervical radiculopathy   [place] [facility] Duanne Butler DASEN, MD   3 months ago Type 2 diabetes mellitus with retinopathy, with long-term current use of insulin , macular edema presence unspecified, unspecified laterality, unspecified retinopathy severity [facility])   [place] [facility] Duanne Butler DASEN, MD   6 months ago History of amputation of left great toe   [place] [facility] Kayla Jeoffrey RAMAN, FNP   7 months ago Cellulitis of left upper extremity   [place] [facility] Aletha Bene, MD

## 2024-02-13 ENCOUNTER — Ambulatory Visit (HOSPITAL_COMMUNITY): Attending: Physician Assistant

## 2024-02-13 ENCOUNTER — Encounter (HOSPITAL_COMMUNITY): Payer: Self-pay

## 2024-02-13 DIAGNOSIS — M542 Cervicalgia: Secondary | ICD-10-CM | POA: Diagnosis present

## 2024-02-13 DIAGNOSIS — M4802 Spinal stenosis, cervical region: Secondary | ICD-10-CM | POA: Diagnosis present

## 2024-02-13 DIAGNOSIS — M5412 Radiculopathy, cervical region: Secondary | ICD-10-CM | POA: Insufficient documentation

## 2024-02-13 NOTE — Therapy (Signed)
 OUTPATIENT PHYSICAL THERAPY CERVICAL EVALUATION   Patient Name: Aaron Fox MRN: [id number] DOB:1956/02/21, 68 y.o., male Today's Date: 02/13/2024  END OF SESSION:  PT End of Session - 02/13/24 0732     Visit Number 2    Number of Visits 6    Date for Recertification  03/05/24    Authorization Type [employer]    Authorization Time Period cohere approved 8 visits from 01/23/24-04/12/24    PT Start Time 0732    PT Stop Time 0811    PT Time Calculation (min) 39 min    Activity Tolerance Patient tolerated treatment well    Behavior During Therapy [facility] for tasks assessed/performed          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   Lymphadenopathy    Peripheral neuropathy    PUD (peptic ulcer disease)    Rheumatoid arthritis(714.0) 09/22/2006   Past Surgical History:  Procedure Laterality Date   ACHILLES TENDON SURGERY Right 04/25/2019   Procedure: ACHILLES LENGTHENING;  Surgeon: Gershon Donnice SAUNDERS, DPM;  Location: [facility];  Service: Podiatry;  Laterality: Right;   AMPUTATION Right 04/25/2019   Procedure: AMPUTATION FOOT,TRANSMETATARSAL;  Surgeon: Gershon Donnice SAUNDERS, DPM;  Location: [facility];  Service: Podiatry;  Laterality: Right;   AMPUTATION TOE Left 07/30/2023   Procedure: AMPUTATION, TOE;  Surgeon: Malvin Marsa FALCON, DPM;  Location: [facility];  Service: Orthopedics/Podiatry;  Laterality: Left;  LEFT GREAT TOE AMPUTAION   AMPUTATION TOE Left 09/26/2023    Procedure: AMPUTATION,  SECOND TOE;  Surgeon: Malvin Marsa FALCON, DPM;  Location: [facility] OR;  Service: Orthopedics/Podiatry;  Laterality: Left;  2nd toe   CATARACT EXTRACTION Bilateral    Dr. Lavonia   CATARACT EXTRACTION, BILATERAL     COLONOSCOPY  2004   LYMPH NODE BIOPSY Right 10/16/2014   Procedure: RIGHT INGUINAL LYMPH NODE BIOPSY;  Surgeon: Oneil Budge, MD;  Location: [facility];  Service: General;  Laterality: Right;   Patient Active Problem List   Diagnosis Date Noted   History of amputation of left great toe 08/02/2023   Osteomyelitis of great toe of left foot (HCC) 07/27/2023   Osteomyelitis (HCC) 04/25/2019   Diabetic retinopathy of both eyes (HCC)    Low back pain 12/17/2017   Irritant contact dermatitis 09/21/2016   Cervical radiculitis 02/08/2016   Hx of adenomatous colonic polyps 06/09/2015   Dyspnea on exertion 05/03/2015   Lymphadenopathy, retroperitoneal 09/17/2014   PSA elevation 05/27/2014   Iron deficiency anemia 05/27/2014   Wellness examination 04/20/2014   Diabetic foot ulcer (HCC) 11/21/2013   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   Piriformis syndrome of left side 08/25/2013   Dyspnea 07/24/2013   Knee pain 07/24/2013   Routine general medical examination at a health care facility 07/24/2013   Numbness and tingling in both hands 10/14/2012   Encounter for long-term (current) use of other medications  11/13/2011   Disturbance of skin sensation 11/13/2011   Screening for prostate cancer 11/13/2011   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   Shoulder pain, right 11/13/2011   BALANITIS 09/01/2009   HYPOKALEMIA 05/31/2009   Essential hypertension 05/31/2009   LEG PAIN, BILATERAL 02/15/2009   HYPERCHOLESTEROLEMIA 07/02/2008   ALLERGIC RHINITIS 07/02/2008   CHF 06/19/2008   MICROSCOPIC HEMATURIA 06/19/2008   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   Hypogonadism male 01/15/2007   Diabetes (HCC) 09/22/2006   Gout  09/22/2006   RHEUMATOID ARTHRITIS 09/22/2006    PCP: Duanne Butler DASEN, MD  REFERRING PROVIDER: Ulis Bottcher, PA-C  REFERRING DIAG:  Diagnosis  M48.02 (ICD-10-CM) - Spinal stenosis in cervical region    THERAPY DIAG:  Cervical stenosis of spine  Radiculopathy, cervical region  Neck pain  Rationale for Evaluation and Treatment: Rehabilitation  ONSET DATE: a year or so but getting worse lately  SUBJECTIVE:                                                                                                                                                                                                         SUBJECTIVE STATEMENT: 02/13/24:  Lt side neck pain with radicular symptoms down to fijger tips, pain scale 8/10.  Reports inability to sleep in bed due to pain, sleeps in chair.  Eval:  Pain from left side of the neck down into the arm and hand sometimes up to left ear; sometimes into the right arm; going on about a year; worse lately; had one episode where felt like a lighting bolt went up my left arm to my neck and into my right arm; so decided to get it checked out.  Will be seeing about getting some injections in his neck 12/16 Hand dominance: Right  PERTINENT HISTORY:  DM; neuropathy In feet  PAIN:  Are you having pain? Yes: NPRS scale: 8/10 now; 2/10 at best; up to 10/10 Pain location: left side of neck Pain description: tingling, shooting and sharp Aggravating factors: unknown Relieving factors: put hand on top of the head  PRECAUTIONS: None  RED FLAGS: None     WEIGHT BEARING RESTRICTIONS: No  FALLS:  Has patient fallen in last 6 months? No   OCCUPATION: retired but very activearts development officer care  PLOF: Independent  PATIENT GOALS: get out of feeling so bad and hurting  NEXT MD VISIT: 03/07/23 for injections  OBJECTIVE:  Note: Objective measures were completed at Evaluation unless otherwise noted.  DIAGNOSTIC FINDINGS:  MPRESSION: 1. Mild anterior  subluxation of C4 on C5 on the  flexion view, possibly degenerative or indicative of ligamentous injury. No subluxation on neutral or extension views. 2. Degenerative changes with narrowing and fusion at C5-C6 and osteophyte formation at C4-C5 and C6-C7 levels.   Electronically signed by: Elsie Gravely MD 01/20/2024 07:09 PM EST RP Workstation: [id number]  IMPRESSION: 1. Cervical spondylosis and degenerative disc disease, causing prominent impingement at C3-4; moderate impingement at C2-3 and C4-5; and borderline impingement at C5-6 and C6-7.     Electronically Signed   By: Ryan Salvage M.D.   On: 12/13/2023 15:55  PATIENT SURVEYS:  NDI:  NECK DISABILITY INDEX  Date: 01/23/2024 Score                                Total 14/50; 28%   Minimum Detectable Change (90% confidence): 5 points or 10% points  COGNITION: Overall cognitive status: Within functional limits for tasks assessed  SENSATION: Tingling down left arm  POSTURE: rounded shoulders and forward head  PALPATION: Tender bilat upper traps left > right   CERVICAL ROM: * pain  Active ROM AROM (deg) eval  Flexion 31  Extension 48*  Right lateral flexion 30*  Left lateral flexion 38  Right rotation 57  Left rotation 55*   (Blank rows = not tested)  UPPER EXTREMITY ROM:  Active ROM Right eval Left eval  Shoulder flexion    Shoulder extension    Shoulder abduction    Shoulder adduction    Shoulder extension    Shoulder internal rotation    Shoulder external rotation    Elbow flexion    Elbow extension    Wrist flexion    Wrist extension    Wrist ulnar deviation    Wrist radial deviation    Wrist pronation    Wrist supination     (Blank rows = not tested)  UPPER EXTREMITY MMT:  MMT Right eval Left eval  Shoulder flexion 5 4+  Shoulder extension    Shoulder abduction    Shoulder adduction    Shoulder extension    Shoulder internal rotation 5 4+  Shoulder external  rotation 5 4  Middle trapezius    Lower trapezius    Elbow flexion 5 4+  Elbow extension 5 4+  Wrist flexion    Wrist extension    Wrist ulnar deviation    Wrist radial deviation    Wrist pronation    Wrist supination    Grip strength     (Blank rows = not tested)  CERVICAL SPECIAL TESTS:  Distraction test: Negative  FUNCTIONAL TESTS:  5 times sit to stand: next visit  TREATMENT DATE:  02/13/24: Reviewed goals Educated importance of HEP compliance for maximal benefits  Supine:  - Decompression 2-5 5x 5 with verbal and tactile cueing to improve mechanics Manual:  -STM to Cervical mm, Lt UT, manual traction Seated:  - Postural education  - Cervical retraction  - Scapular retraction    01/23/24 physical therapy evaluation and HEP instruction; supine manual traction 10 hold x 10  PATIENT EDUCATION:  Education details: Patient educated on exam findings, POC, scope of PT, HEP, and what to expect next visit. Person educated: Patient Education method: Explanation, Demonstration, and Handouts Education comprehension: verbalized understanding, returned demonstration, verbal cues required, and tactile cues required  HOME EXERCISE PROGRAM: 01/23/24 Decompression position and shoulder press 5 hold x 10  02/13/24:  Postural awareness  Scapular retraction  Cervical retraction.  ASSESSMENT:  CLINICAL IMPRESSION: 02/13/24:  Reviewed goals and educated importance of HEP compliance for maximal benefits.  Pt reports complaince with current HEP though reports increased pain in supine position and reports of burning in Lt UE with exercise.  Reviewed decompression with some cueing required to improve mechanics with scapular and cervical retraction.  Pt with difficulty laying supine, improved tolerance with seated postural strengthening that was added to  HEP.  Manual STM and traction complete with mix responses, stated he felt good during the traction though painful following, did have moderate spasms Lt UT that was reduced following manual STM though not fully resolved.  EOS pt reports pain reduced to 5/10.   Eval:  Patient is a 68 y.o. male who was seen today for physical therapy evaluation and treatment for M48.02 (ICD-10-CM) - Spinal stenosis in cervical region. Patient demonstrates decreased strength, ROM restriction, reduced flexibility, increased tenderness to palpation and postural abnormalities which are likely contributing to symptoms of pain and are negatively impacting patient ability to perform ADLs. Patient will benefit from skilled physical therapy services to address these deficits to reduce pain and improve level of function with ADLs  .   OBJECTIVE IMPAIRMENTS: decreased activity tolerance, decreased ROM, decreased strength, impaired perceived functional ability, and pain.   ACTIVITY LIMITATIONS: carrying, lifting, sleeping, reach over head, and hygiene/grooming  PARTICIPATION LIMITATIONS: meal prep, cleaning, occupation, and yard work  PERSONAL FACTORS: 1 comorbidity: DM are also affecting patient's functional outcome.   REHAB POTENTIAL: Good  CLINICAL DECISION MAKING: Evolving/moderate complexity  EVALUATION COMPLEXITY: Moderate   GOALS: Goals reviewed with patient? No  SHORT TERM GOALS: Target date: 02/03/2024  patient will be independent with initial HEP and compliant with HEP 3-4 times a week   Baseline:  Goal status: INITIAL  2.  Patient will report 50% improvement overall  Baseline:  Goal status: INITIAL   LONG TERM GOALS: Target date: 03/06/2023  Patient will be independent in self management strategies to improve quality of life and functional outcomes.  Baseline:  Goal status: INITIAL  2.  Patient will report 80% improvement overall  Baseline:  Goal status: INITIAL  3.  Patient will  increase cervical mobility by 20 degrees throughout to improve ability to scan for safety with driving  Baseline: see above Goal status: INITIAL  4.   Patient will increase left arm MMT's to 5/5 to allow working in the yard and around the house without any UE limitation with reaching and lifting activity  Baseline:  Goal status: INITIAL  5.  Patient will improve NDI score by 5  points to demonstrate improved perceived function  Baseline: 14/50 Goal status: INITIAL   PLAN:  PT FREQUENCY: 1x/week  PT DURATION: 6 weeks  PLANNED INTERVENTIONS: 97164- PT Re-evaluation, 97110-Therapeutic exercises, 97530- Therapeutic activity, 97112- Neuromuscular re-education, 97535- Self Care, [id number]- Manual therapy, [id number]- Gait training, [phone]- Orthotic Fit/training, [phone]- Canalith repositioning, [id number]- Aquatic Therapy, 97760- Splinting, [id number]- Wound care (first 20 sq cm), 97598- Wound care (each additional 20 sq cm)Patient/Family education, Balance training, Stair training, Taping, Dry Needling, Joint mobilization, Joint manipulation, Spinal manipulation, Spinal  mobilization, Scar mobilization, and DME instructions.   PLAN FOR NEXT SESSION:manual traction, postural strengthening, decompression exercises; discuss possible home traction if good response to manual traction.  Add UT stretch next session.   Augustin Mclean, LPTA/CLT; CBIS [phone]  4:44 PM, 02/13/2024

## 2024-02-15 ENCOUNTER — Ambulatory Visit (HOSPITAL_COMMUNITY)

## 2024-03-04 ENCOUNTER — Encounter (HOSPITAL_COMMUNITY): Payer: Self-pay

## 2024-03-04 ENCOUNTER — Ambulatory Visit (HOSPITAL_COMMUNITY): Attending: Physician Assistant

## 2024-03-04 DIAGNOSIS — M5412 Radiculopathy, cervical region: Secondary | ICD-10-CM | POA: Diagnosis present

## 2024-03-04 DIAGNOSIS — M542 Cervicalgia: Secondary | ICD-10-CM | POA: Insufficient documentation

## 2024-03-04 DIAGNOSIS — M4802 Spinal stenosis, cervical region: Secondary | ICD-10-CM | POA: Insufficient documentation

## 2024-03-04 NOTE — Therapy (Signed)
 " OUTPATIENT PHYSICAL THERAPY CERVICAL EVALUATION   Patient Name: Aaron Fox MRN: [id number] DOB:1955/08/03, 69 y.o., male Today's Date: 03/04/2024  END OF SESSION:  PT End of Session - 03/04/24 0732     Visit Number 3    Number of Visits 6    Date for Recertification  03/05/24    Authorization Type [employer]    Authorization Time Period cohere approved 8 visits from 01/23/24-04/12/24    PT Start Time 0732    PT Stop Time 0812    PT Time Calculation (min) 40 min    Activity Tolerance Patient tolerated treatment well;Patient limited by pain;No increased pain    Behavior During Therapy [facility] for tasks assessed/performed          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   Lymphadenopathy    Peripheral neuropathy    PUD (peptic ulcer disease)    Rheumatoid arthritis(714.0) 09/22/2006   Past Surgical History:  Procedure Laterality Date   ACHILLES TENDON SURGERY Right 04/25/2019   Procedure: ACHILLES LENGTHENING;  Surgeon: Gershon Donnice SAUNDERS, DPM;  Location: [facility];  Service: Podiatry;  Laterality: Right;   AMPUTATION Right 04/25/2019   Procedure: AMPUTATION FOOT,TRANSMETATARSAL;  Surgeon: Gershon Donnice SAUNDERS, DPM;  Location: [facility];  Service: Podiatry;  Laterality: Right;   AMPUTATION TOE Left 07/30/2023   Procedure: AMPUTATION, TOE;  Surgeon: Malvin Marsa FALCON, DPM;  Location: [facility];  Service: Orthopedics/Podiatry;  Laterality: Left;  LEFT GREAT TOE  AMPUTAION   AMPUTATION TOE Left 09/26/2023   Procedure: AMPUTATION,  SECOND TOE;  Surgeon: Malvin Marsa FALCON, DPM;  Location: [facility];  Service: Orthopedics/Podiatry;  Laterality: Left;  2nd toe   CATARACT EXTRACTION Bilateral    Dr. Lavonia   CATARACT EXTRACTION, BILATERAL     COLONOSCOPY  2004   LYMPH NODE BIOPSY Right 10/16/2014   Procedure: RIGHT INGUINAL LYMPH NODE BIOPSY;  Surgeon: Oneil Budge, MD;  Location: [facility];  Service: General;  Laterality: Right;   Patient Active Problem List   Diagnosis Date Noted   History of amputation of left great toe 08/02/2023   Osteomyelitis of great toe of left foot (HCC) 07/27/2023   Osteomyelitis (HCC) 04/25/2019   Diabetic retinopathy of both eyes (HCC)    Low back pain 12/17/2017   Irritant contact dermatitis 09/21/2016   Cervical radiculitis 02/08/2016   Hx of adenomatous colonic polyps 06/09/2015   Dyspnea on exertion 05/03/2015   Lymphadenopathy, retroperitoneal 09/17/2014   PSA elevation 05/27/2014   Iron deficiency anemia 05/27/2014   Wellness examination 04/20/2014   Diabetic foot ulcer (HCC) 11/21/2013   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   Piriformis syndrome of left side 08/25/2013   Dyspnea 07/24/2013   Knee pain 07/24/2013   Routine general medical examination at a health care facility 07/24/2013   Numbness and tingling in both hands 10/14/2012   Encounter for  long-term (current) use of other medications 11/13/2011   Disturbance of skin sensation 11/13/2011   Screening for prostate cancer 11/13/2011   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   Shoulder pain, right 11/13/2011   BALANITIS 09/01/2009   HYPOKALEMIA 05/31/2009   Essential hypertension 05/31/2009   LEG PAIN, BILATERAL 02/15/2009   HYPERCHOLESTEROLEMIA 07/02/2008   ALLERGIC RHINITIS 07/02/2008   CHF 06/19/2008   MICROSCOPIC HEMATURIA 06/19/2008   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   Hypogonadism male  01/15/2007   Diabetes (HCC) 09/22/2006   Gout 09/22/2006   RHEUMATOID ARTHRITIS 09/22/2006    PCP: Duanne Butler DASEN, MD  REFERRING PROVIDER: Ulis Bottcher, PA-C  REFERRING DIAG:  Diagnosis  M48.02 (ICD-10-CM) - Spinal stenosis in cervical region    THERAPY DIAG:  Cervical stenosis of spine  Radiculopathy, cervical region  Neck pain  Rationale for Evaluation and Treatment: Rehabilitation  ONSET DATE: a year or so but getting worse lately  SUBJECTIVE:                                                                                                                                                                                                         SUBJECTIVE STATEMENT: 03/04/24:  Reports he woke up with both arms numb, had to move to chair to sleep at night.  Current pain scale 8/10 left side neck pain with radicular symptoms down Lt UE to all fingers.  Eval:  Pain from left side of the neck down into the arm and hand sometimes up to left ear; sometimes into the right arm; going on about a year; worse lately; had one episode where felt like a lighting bolt went up my left arm to my neck and into my right arm; so decided to get it checked out.  Will be seeing about getting some injections in his neck 12/16 Hand dominance: Right  PERTINENT HISTORY:  DM; neuropathy In feet  PAIN:  Are you having pain? Yes: NPRS scale: 8/10 now; 2/10 at best; up to 10/10 Pain location: left side of neck Pain description: tingling, shooting and sharp Aggravating factors: unknown Relieving factors: put hand on top of the head  PRECAUTIONS: None  RED FLAGS: None     WEIGHT BEARING RESTRICTIONS: No  FALLS:  Has patient fallen in last 6 months? No   OCCUPATION: retired but very activearts development officer care  PLOF: Independent  PATIENT GOALS: get out of feeling so bad and hurting  NEXT MD VISIT: 03/07/23 for injections  OBJECTIVE:  Note: Objective measures were completed at Evaluation unless  otherwise noted.  DIAGNOSTIC FINDINGS:  MPRESSION: 1. Mild anterior subluxation of C4 on C5 on the flexion view, possibly degenerative or indicative of ligamentous injury. No subluxation on neutral or extension views. 2. Degenerative changes with narrowing and fusion at C5-C6 and osteophyte formation at C4-C5 and C6-C7 levels.   Electronically signed by: Elsie Gravely MD 01/20/2024 07:09 PM EST RP Workstation: [id number]  IMPRESSION: 1. Cervical spondylosis and degenerative disc disease, causing prominent impingement at C3-4; moderate impingement at C2-3 and C4-5; and borderline impingement at C5-6 and C6-7.     Electronically Signed   By: Ryan Salvage M.D.   On: 12/13/2023 15:55  PATIENT SURVEYS:  NDI:  NECK DISABILITY INDEX  Date: 01/23/2024 Score                                Total 14/50; 28%   Minimum Detectable Change (90% confidence): 5 points or 10% points  COGNITION: Overall cognitive status: Within functional limits for tasks assessed  SENSATION: Tingling down left arm  POSTURE: rounded shoulders and forward head  PALPATION: Tender bilat upper traps left > right   CERVICAL ROM: * pain  Active ROM AROM (deg) eval  Flexion 31  Extension 48*  Right lateral flexion 30*  Left lateral flexion 38  Right rotation 57  Left rotation 55*   (Blank rows = not tested)  UPPER EXTREMITY ROM:  Active ROM Right eval Left eval  Shoulder flexion    Shoulder extension    Shoulder abduction    Shoulder adduction    Shoulder extension    Shoulder internal rotation    Shoulder external rotation    Elbow flexion    Elbow extension    Wrist flexion    Wrist extension    Wrist ulnar deviation    Wrist radial deviation    Wrist pronation    Wrist supination     (Blank rows = not tested)  UPPER EXTREMITY MMT:  MMT Right eval Left eval  Shoulder flexion 5 4+  Shoulder extension    Shoulder abduction    Shoulder adduction    Shoulder  extension    Shoulder internal rotation 5 4+  Shoulder external rotation 5 4  Middle trapezius    Lower trapezius    Elbow flexion 5 4+  Elbow extension 5 4+  Wrist flexion    Wrist extension    Wrist ulnar deviation    Wrist radial deviation    Wrist pronation    Wrist supination    Grip strength     (Blank rows = not tested)  CERVICAL SPECIAL TESTS:  Distraction test: Negative  FUNCTIONAL TESTS:  5 times sit to stand: 03/04/24: 15.16 no HHA or increased pain   TREATMENT DATE:  03/04/24: Seated:  - posterior shoulder rolls (up, back and down)  - 3D cervical excursion 5x each pain free range  - Rows with RTB  - Cervical retraction with verbal and tactile cueing  - UT Stretch 3x 30 5STS 15.16 no HHA or increased pain Manual in supine position with LE elevated  -STM to Cervical mm, Lt UT, manual traction  02/13/24: Reviewed goals Educated importance of HEP compliance for maximal benefits  Supine:  - Decompression 2-5 5x 5 with verbal and tactile cueing to improve mechanics Manual:  -STM to Cervical mm, Lt UT, manual traction Seated:  - Postural education  - Cervical retraction  - Scapular retraction    01/23/24 physical  therapy evaluation and HEP instruction; supine manual traction 10 hold x 10                                                                                                                                PATIENT EDUCATION:  Education details: Patient educated on exam findings, POC, scope of PT, HEP, and what to expect next visit. Person educated: Patient Education method: Explanation, Demonstration, and Handouts Education comprehension: verbalized understanding, returned demonstration, verbal cues required, and tactile cues required  HOME EXERCISE PROGRAM: 01/23/24 Decompression position and shoulder press 5 hold x 10  02/13/24:  Postural awareness  Scapular retraction  Cervical retraction.  Access Code: [email or web address] URL:  [email or web address] Date: 03/04/2024 Prepared by: Augustin Mclean  Exercises - Correct Seated Posture  - 1 x daily - 7 x weekly - 3 sets - 10 reps - Seated Cervical Retraction  - 1 x daily - 7 x weekly - 3 sets - 10 reps - Seated Scapular Retraction  - 1 x daily - 7 x weekly - 3 sets - 10 reps - Seated Cervical Sidebending Stretch  - 2 x daily - 7 x weekly - 1 sets - 3 reps - 30 hold ASSESSMENT:  CLINICAL IMPRESSION: 03/04/24:  Increased focus with cervical mobility and continued with postural strengthening.  Pt required multimodal cueing to improve mechanics with cervical retraction.  Added cervical mobility exercises with positive feedback, added UT stretch to current home exercise program.  EOS with manual STM to address moderate spasms, positive reports with static manual traction.  Reports of radicular symptoms reduced following UT stretch.  Reports of improved centralization with radicular symptoms ending at shoulder.  Plans to receive epidural on Thursday, instructed 3 day rest following.  Eval:  Patient is a 69 y.o. male who was seen today for physical therapy evaluation and treatment for M48.02 (ICD-10-CM) - Spinal stenosis in cervical region. Patient demonstrates decreased strength, ROM restriction, reduced flexibility, increased tenderness to palpation and postural abnormalities which are likely contributing to symptoms of pain and are negatively impacting patient ability to perform ADLs. Patient will benefit from skilled physical therapy services to address these deficits to reduce pain and improve level of function with ADLs  .   OBJECTIVE IMPAIRMENTS: decreased activity tolerance, decreased ROM, decreased strength, impaired perceived functional ability, and pain.   ACTIVITY LIMITATIONS: carrying, lifting, sleeping, reach over head, and hygiene/grooming  PARTICIPATION LIMITATIONS: meal prep, cleaning, occupation, and yard work  PERSONAL FACTORS: 1 comorbidity: DM  are also affecting patient's functional outcome.   REHAB POTENTIAL: Good  CLINICAL DECISION MAKING: Evolving/moderate complexity  EVALUATION COMPLEXITY: Moderate   GOALS: Goals reviewed with patient? No  SHORT TERM GOALS: Target date: 02/03/2024  patient will be independent with initial HEP and compliant with HEP 3-4 times a week   Baseline:  Goal status: INITIAL  2.  Patient will report 50% improvement overall  Baseline:  Goal status: INITIAL   LONG TERM  GOALS: Target date: 03/06/2023  Patient will be independent in self management strategies to improve quality of life and functional outcomes.  Baseline:  Goal status: INITIAL  2.  Patient will report 80% improvement overall  Baseline:  Goal status: INITIAL  3.  Patient will increase cervical mobility by 20 degrees throughout to improve ability to scan for safety with driving  Baseline: see above Goal status: INITIAL  4.   Patient will increase left arm MMT's to 5/5 to allow working in the yard and around the house without any UE limitation with reaching and lifting activity  Baseline:  Goal status: INITIAL  5.  Patient will improve NDI score by 5  points to demonstrate improved perceived function  Baseline: 14/50 Goal status: INITIAL   PLAN:  PT FREQUENCY: 1x/week  PT DURATION: 6 weeks  PLANNED INTERVENTIONS: 97164- PT Re-evaluation, 97110-Therapeutic exercises, 97530- Therapeutic activity, 97112- Neuromuscular re-education, 97535- Self Care, [id number]- Manual therapy, [id number]- Gait training, [phone]- Orthotic Fit/training, [phone]- Canalith repositioning, [id number]- Aquatic Therapy, [phone]- Splinting, [phone]- Wound care (first 20 sq cm), 97598- Wound care (each additional 20 sq cm)Patient/Family education, Balance training, Stair training, Taping, Dry Needling, Joint mobilization, Joint manipulation, Spinal manipulation, Spinal mobilization, Scar mobilization, and DME instructions.   PLAN FOR NEXT SESSION:manual  traction, postural strengthening, decompression exercises; discuss possible home traction if good response to manual traction.   Augustin Mclean, LPTA/CLT; CBIS [phone]  11:03 AM, 03/04/2024       "

## 2024-03-05 ENCOUNTER — Ambulatory Visit (HOSPITAL_COMMUNITY)

## 2024-03-07 ENCOUNTER — Ambulatory Visit

## 2024-03-07 DIAGNOSIS — Z23 Encounter for immunization: Secondary | ICD-10-CM

## 2024-03-07 NOTE — Progress Notes (Signed)
 Patient is in office today for a nurse visit for Immunization. Patient Injection was given in the  Right deltoid. Patient tolerated injection well.

## 2024-03-11 ENCOUNTER — Ambulatory Visit (HOSPITAL_COMMUNITY)

## 2024-03-12 ENCOUNTER — Encounter (HOSPITAL_COMMUNITY): Payer: Self-pay

## 2024-03-12 ENCOUNTER — Ambulatory Visit (HOSPITAL_COMMUNITY)

## 2024-03-12 DIAGNOSIS — M4802 Spinal stenosis, cervical region: Secondary | ICD-10-CM | POA: Diagnosis not present

## 2024-03-12 DIAGNOSIS — M542 Cervicalgia: Secondary | ICD-10-CM

## 2024-03-12 DIAGNOSIS — M5412 Radiculopathy, cervical region: Secondary | ICD-10-CM

## 2024-03-12 NOTE — Therapy (Addendum)
 " OUTPATIENT PHYSICAL THERAPY CERVICAL TREATMENT  Progress Note Reporting Period 01/23/24 to 03/12/24  See note below for Objective Data and Assessment of Progress/Goals.     Patient Name: Aaron Fox MRN: [id number] DOB:1955-11-05, 69 y.o., male Today's Date: 03/12/2024  END OF SESSION:  PT End of Session - 03/12/24 0729     Visit Number 4    Number of Visits 6    Date for Recertification  03/05/24    Authorization Type [employer]    Authorization Time Period cohere approved 8 visits from 01/23/24-04/12/24    PT Start Time 0732    PT Stop Time 0812    PT Time Calculation (min) 40 min    Activity Tolerance Patient tolerated treatment well;Patient limited by pain;No increased pain    Behavior During Therapy 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   Lymphadenopathy    Peripheral neuropathy    PUD (peptic ulcer disease)    Rheumatoid arthritis(714.0) 09/22/2006   Past Surgical History:  Procedure Laterality Date   ACHILLES TENDON SURGERY Right 04/25/2019   Procedure: ACHILLES LENGTHENING;  Surgeon: Gershon Donnice SAUNDERS, DPM;  Location: [facility];  Service: Podiatry;  Laterality: Right;   AMPUTATION Right 04/25/2019   Procedure: AMPUTATION FOOT,TRANSMETATARSAL;  Surgeon: Gershon Donnice SAUNDERS, DPM;  Location: [facility];  Service: Podiatry;  Laterality: Right;   AMPUTATION TOE Left 07/30/2023   Procedure: AMPUTATION, TOE;   Surgeon: Malvin Marsa FALCON, DPM;  Location: [facility];  Service: Orthopedics/Podiatry;  Laterality: Left;  LEFT GREAT TOE AMPUTAION   AMPUTATION TOE Left 09/26/2023   Procedure: AMPUTATION,  SECOND TOE;  Surgeon: Malvin Marsa FALCON, DPM;  Location: [facility];  Service: Orthopedics/Podiatry;  Laterality: Left;  2nd toe   CATARACT EXTRACTION Bilateral    Dr. Lavonia   CATARACT EXTRACTION, BILATERAL     COLONOSCOPY  2004   LYMPH NODE BIOPSY Right 10/16/2014   Procedure: RIGHT INGUINAL LYMPH NODE BIOPSY;  Surgeon: Oneil Budge, MD;  Location: [facility];  Service: General;  Laterality: Right;   Patient Active Problem List   Diagnosis Date Noted   History of amputation of left great toe 08/02/2023   Osteomyelitis of great toe of left foot (HCC) 07/27/2023   Osteomyelitis (HCC) 04/25/2019   Diabetic retinopathy of both eyes (HCC)    Low back pain 12/17/2017   Irritant contact dermatitis 09/21/2016   Cervical radiculitis 02/08/2016   Hx of adenomatous colonic polyps 06/09/2015   Dyspnea on exertion 05/03/2015   Lymphadenopathy, retroperitoneal 09/17/2014   PSA elevation 05/27/2014   Iron deficiency anemia 05/27/2014   Wellness examination 04/20/2014   Diabetic foot ulcer (HCC) 11/21/2013   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   Piriformis syndrome of left side 08/25/2013   Dyspnea 07/24/2013   Knee pain 07/24/2013   Routine general  medical examination at a health care facility 07/24/2013   Numbness and tingling in both hands 10/14/2012   Encounter for long-term (current) use of other medications 11/13/2011   Disturbance of skin sensation 11/13/2011   Screening for prostate cancer 11/13/2011   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   Shoulder pain, right 11/13/2011   BALANITIS 09/01/2009   HYPOKALEMIA 05/31/2009   Essential hypertension 05/31/2009   LEG PAIN, BILATERAL 02/15/2009   HYPERCHOLESTEROLEMIA 07/02/2008   ALLERGIC RHINITIS 07/02/2008    CHF 06/19/2008   MICROSCOPIC HEMATURIA 06/19/2008   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   Hypogonadism male 01/15/2007   Diabetes (HCC) 09/22/2006   Gout 09/22/2006   RHEUMATOID ARTHRITIS 09/22/2006    PCP: Duanne Butler DASEN, MD  REFERRING PROVIDER: Ulis Bottcher, PA-C  REFERRING DIAG:  Diagnosis  M48.02 (ICD-10-CM) - Spinal stenosis in cervical region    THERAPY DIAG:  Cervical stenosis of spine  Radiculopathy, cervical region  Neck pain  Rationale for Evaluation and Treatment: Rehabilitation  ONSET DATE: a year or so but getting worse lately  SUBJECTIVE:                                                                                                                                                                                                         SUBJECTIVE STATEMENT: 03/12/14:  Reports  he received epidural shot last Thursday, lasted 1 day then pain is back.  Current pain scale 3/10 left side neck pain with radiculary current ending mid shoulder, does go down to fingers at times.  Feels the stretches have been the biggest help with pain and radicular symptoms.  Reports he has returned to the bed for the last 3 nights.  Eval:  Pain from left side of the neck down into the arm and hand sometimes up to left ear; sometimes into the right arm; going on about a year; worse lately; had one episode where felt like a lighting bolt went up my left arm to my neck and into my right arm; so decided to get it checked out.  Will be seeing about getting some injections in his neck 12/16 Hand dominance: Right  PERTINENT HISTORY:  DM; neuropathy In feet  PAIN:  Are you having pain? Yes: NPRS scale: 8/10 now; 2/10 at best; up to 10/10 Pain location: left side of neck Pain description: tingling, shooting and sharp Aggravating factors: unknown Relieving factors: put hand on top of the head  PRECAUTIONS: None  RED FLAGS: None     WEIGHT BEARING RESTRICTIONS:  No  FALLS:  Has patient  fallen in last 6 months? No   OCCUPATION: retired but very activearts development officer care  PLOF: Independent  PATIENT GOALS: get out of feeling so bad and hurting  NEXT MD VISIT: 03/07/23 for injections, 04/05/24  OBJECTIVE:  Note: Objective measures were completed at Evaluation unless otherwise noted.  DIAGNOSTIC FINDINGS:  MPRESSION: 1. Mild anterior subluxation of C4 on C5 on the flexion view, possibly degenerative or indicative of ligamentous injury. No subluxation on neutral or extension views. 2. Degenerative changes with narrowing and fusion at C5-C6 and osteophyte formation at C4-C5 and C6-C7 levels.   Electronically signed by: Elsie Gravely MD 01/20/2024 07:09 PM EST RP Workstation: [id number]  IMPRESSION: 1. Cervical spondylosis and degenerative disc disease, causing prominent impingement at C3-4; moderate impingement at C2-3 and C4-5; and borderline impingement at C5-6 and C6-7.     Electronically Signed   By: Ryan Salvage M.D.   On: 12/13/2023 15:55  PATIENT SURVEYS:  NDI:  NECK DISABILITY INDEX  Date: 01/23/2024 Score                                Total 14/50; 28%   Minimum Detectable Change (90% confidence): 5 points or 10% points NDI:  NECK DISABILITY INDEX  Date: 03/12/24 Score  Pain intensity 2 = The pain is moderate at the moment  2. Personal care (washing, dressing, etc.) 1 =  I can look after myself normally but it causes extra pain  3. Lifting 1 =  I can lift heavy weights but it gives extra pain  4. Reading 2 =  I can read as much as I want with moderate pain in my neck  5. Headaches 0 = I have no headaches at all  6. Concentration 0 =  I can concentrate fully when I want to with no difficulty  7. Work 0 =  I can do as much work as I want to  8. Driving 2 =  I can drive my car as long as I want with moderate pain in my neck  9. Sleeping 5 =  My sleep is completely disturbed (5-7 hrs sleepless)   10. Recreation 1  =  I am able to engage in all my recreation activities, with some pain in my neck  Total 14/50= 28%   Minimum Detectable Change (90% confidence): 5 points or 10% points  COGNITION: Overall cognitive status: Within functional limits for tasks assessed  SENSATION: Tingling down left arm  POSTURE: rounded shoulders and forward head  PALPATION: Tender bilat upper traps left > right   CERVICAL ROM: * pain  Active ROM AROM (deg) eval 03/12/24:  Flexion 31 35  Extension 48* 48* pressure  Right lateral flexion 30* 38 Assisted with tingling  Left lateral flexion 38 40* Increased tingling  Right rotation 57 60  Left rotation 55* 60*   (Blank rows = not tested)  UPPER EXTREMITY ROM:  Active ROM Right eval Left eval  Shoulder flexion    Shoulder extension    Shoulder abduction    Shoulder adduction    Shoulder extension    Shoulder internal rotation    Shoulder external rotation    Elbow flexion    Elbow extension    Wrist flexion    Wrist extension    Wrist ulnar deviation    Wrist radial deviation    Wrist pronation    Wrist supination     (Blank rows = not  tested)  UPPER EXTREMITY MMT:  MMT Right eval Left eval Left  03/12/24  Shoulder flexion 5 4+ 4+  Shoulder extension     Shoulder abduction   4+  Shoulder adduction     Shoulder extension     Shoulder internal rotation 5 4+ 4+  Shoulder external rotation 5 4 4+  Middle trapezius     Lower trapezius     Elbow flexion 5 4+ 4+  Elbow extension 5 4+ 4+  Wrist flexion     Wrist extension     Wrist ulnar deviation     Wrist radial deviation     Wrist pronation     Wrist supination     Grip strength      (Blank rows = not tested)  CERVICAL SPECIAL TESTS:  Distraction test: Negative  FUNCTIONAL TESTS:  5 times sit to stand: 03/04/24: 15.16 no HHA or increased pain     03/12/24 13.30 TREATMENT DATE:  03/12/24: UBE 2' forward/2' behind L1 NDI 14/50= 28% Seated: - 3D cervical excursion 5x each pain  free range  - posterior shoulder rolls (up, back and down) ROM see above MMT  - chin tucks, reports increased tingling with retraction, relief with flexed neck  - scapular retraction Standing:  - RTB shoulder extension  - RTB rows   03/04/24: Seated:  - posterior shoulder rolls (up, back and down)  - 3D cervical excursion 5x each pain free range  - Rows with RTB  - Cervical retraction with verbal and tactile cueing  - UT Stretch 3x 30 5STS 15.16 no HHA or increased pain Manual in supine position with LE elevated  -STM to Cervical mm, Lt UT, manual traction  02/13/24: Reviewed goals Educated importance of HEP compliance for maximal benefits  Supine:  - Decompression 2-5 5x 5 with verbal and tactile cueing to improve mechanics Manual:  -STM to Cervical mm, Lt UT, manual traction Seated:  - Postural education  - Cervical retraction  - Scapular retraction    01/23/24 physical therapy evaluation and HEP instruction; supine manual traction 10 hold x 10                                                                                                                                PATIENT EDUCATION:  Education details: Patient educated on exam findings, POC, scope of PT, HEP, and what to expect next visit. Person educated: Patient Education method: Explanation, Demonstration, and Handouts Education comprehension: verbalized understanding, returned demonstration, verbal cues required, and tactile cues required  HOME EXERCISE PROGRAM: 01/23/24 Decompression position and shoulder press 5 hold x 10  02/13/24:  Postural awareness  Scapular retraction  Cervical retraction.  Access Code: [email or web address] URL: [email or web address] Date: 03/04/2024 Prepared by: Augustin Mclean  Exercises - Correct Seated Posture  - 1 x daily - 7 x weekly - 3 sets - 10 reps - Seated Cervical Retraction  - 1 x daily - 7 x  weekly - 3 sets - 10 reps - Seated Scapular Retraction  - 1 x  daily - 7 x weekly - 3 sets - 10 reps - Seated Cervical Sidebending Stretch  - 2 x daily - 7 x weekly - 1 sets - 3 reps - 30 hold ASSESSMENT:  CLINICAL IMPRESSION: 03/12/24:  Reviewed goals with the following findings:  Pt reports compliance iwht HEP dailiy and feels he has learned strategies to assist with pain control.  Pt continues to c/o radicular symptoms daily down Lt UE.  Cervical ROM has improved some, reports of increased tingling and pain with Lt sidebend, Lt rotation and cervical extension, relief with forward head position.  Reports the stretches have been most helpful with radicular symptoms, also reports relief with manual traction.  PT educated on self manual traction available though declined today, feels he needs surgery and returns to MD 04/05/24.  Pt will continue to benefit from skilled intervention to address goals unmet, will continue 2 additional sessions.  Eval:  Patient is a 69 y.o. male who was seen today for physical therapy evaluation and treatment for M48.02 (ICD-10-CM) - Spinal stenosis in cervical region. Patient demonstrates decreased strength, ROM restriction, reduced flexibility, increased tenderness to palpation and postural abnormalities which are likely contributing to symptoms of pain and are negatively impacting patient ability to perform ADLs. Patient will benefit from skilled physical therapy services to address these deficits to reduce pain and improve level of function with ADLs  .   OBJECTIVE IMPAIRMENTS: decreased activity tolerance, decreased ROM, decreased strength, impaired perceived functional ability, and pain.   ACTIVITY LIMITATIONS: carrying, lifting, sleeping, reach over head, and hygiene/grooming  PARTICIPATION LIMITATIONS: meal prep, cleaning, occupation, and yard work  PERSONAL FACTORS: 1 comorbidity: DM are also affecting patient's functional outcome.   REHAB POTENTIAL: Good  CLINICAL DECISION MAKING: Evolving/moderate  complexity  EVALUATION COMPLEXITY: Moderate   GOALS: Goals reviewed with patient? No  SHORT TERM GOALS: Target date: 02/03/2024  patient will be independent with initial HEP and compliant with HEP 3-4 times a week   Baseline: 03/12/24:  Reports compliance with HEP daily Goal status: MET  2.  Patient will report 50% improvement overall  Baseline: 03/12/24: Doesn't feel he has made any improvements, stated it feels good during the moment but comes right back, feels he needs surgery.   Goal status: In progress   LONG TERM GOALS: Target date: 03/06/2023  Patient will be independent in self management strategies to improve quality of life and functional outcomes.  Baseline: 03/12/24:  Reports he has learned a lot with importance of posture, sleeping position and stretches to assist with pain control. Goal status: MET  2.  Patient will report 80% improvement overall  Baseline: 03/12/24: Doesn't feel he has made any improvements, stated it feels good during the moment but comes right back, feels he needs surgery.   Goal status: In progress  3.  Patient will increase cervical mobility by 20 degrees throughout to improve ability to scan for safety with driving  Baseline: see above Goal status: In progress  4.   Patient will increase left arm MMT's to 5/5 to allow working in the yard and around the house without any UE limitation with reaching and lifting activity  Baseline:  Goal status: In progress  5.  Patient will improve NDI score by 5  points to demonstrate improved perceived function  Baseline: 14/50; 03/12/24: 14/50 Goal status: In progress   PLAN:  PT FREQUENCY: 1x/week  PT DURATION:  6 weeks  PLANNED INTERVENTIONS: 97164- PT Re-evaluation, 97110-Therapeutic exercises, 97530- Therapeutic activity, [id number]- Neuromuscular re-education, 97535- Self Care, [id number]- Manual therapy, [phone]- Gait training, [phone]- Orthotic Fit/training, [phone]- Canalith repositioning, [id number]- Aquatic  Therapy, [phone]- Splinting, 97597- Wound care (first 20 sq cm), 97598- Wound care (each additional 20 sq cm)Patient/Family education, Balance training, Stair training, Taping, Dry Needling, Joint mobilization, Joint manipulation, Spinal manipulation, Spinal mobilization, Scar mobilization, and DME instructions.   PLAN FOR NEXT SESSION:manual traction, postural strengthening, decompression exercises; discuss possible home traction if good response to manual traction.   Augustin Mclean, LPTA/CLT; CBIS [phone]  8:25 AM, 03/12/2024       "

## 2024-03-13 ENCOUNTER — Other Ambulatory Visit: Payer: Self-pay | Admitting: Nurse Practitioner

## 2024-03-13 NOTE — Addendum Note (Signed)
 Addended byBETHA LEODIS NO S on: 03/13/2024 09:52 AM   Modules accepted: Orders

## 2024-03-18 ENCOUNTER — Ambulatory Visit (HOSPITAL_COMMUNITY)

## 2024-03-18 DIAGNOSIS — M4802 Spinal stenosis, cervical region: Secondary | ICD-10-CM | POA: Diagnosis not present

## 2024-03-18 DIAGNOSIS — M542 Cervicalgia: Secondary | ICD-10-CM

## 2024-03-18 DIAGNOSIS — M5412 Radiculopathy, cervical region: Secondary | ICD-10-CM

## 2024-03-18 NOTE — Therapy (Signed)
 " OUTPATIENT PHYSICAL THERAPY CERVICAL TREATMENT     Patient Name: Aaron Fox MRN: [id number] DOB:Jul 22, 1955, 69 y.o., male Today's Date: 03/18/2024  END OF SESSION:  PT End of Session - 03/18/24 0727     Visit Number 5    Number of Visits 6    Date for Recertification  03/05/24    Authorization Type [employer]    Authorization Time Period cohere approved 8 visits from 01/23/24-04/12/24    PT Start Time 0728    PT Stop Time 0808    PT Time Calculation (min) 40 min    Activity Tolerance Patient tolerated treatment well    Behavior During Therapy [facility] for tasks assessed/performed          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   Lymphadenopathy    Peripheral neuropathy    PUD (peptic ulcer disease)    Rheumatoid arthritis(714.0) 09/22/2006   Past Surgical History:  Procedure Laterality Date   ACHILLES TENDON SURGERY Right 04/25/2019   Procedure: ACHILLES LENGTHENING;  Surgeon: Gershon Donnice SAUNDERS, DPM;  Location: [facility];  Service: Podiatry;  Laterality: Right;   AMPUTATION Right 04/25/2019   Procedure: AMPUTATION FOOT,TRANSMETATARSAL;  Surgeon: Gershon Donnice SAUNDERS, DPM;  Location: [facility];  Service: Podiatry;  Laterality: Right;   AMPUTATION TOE Left 07/30/2023   Procedure: AMPUTATION, TOE;  Surgeon: Malvin Marsa FALCON, DPM;  Location: [facility];  Service: Orthopedics/Podiatry;  Laterality: Left;  LEFT GREAT TOE AMPUTAION   AMPUTATION TOE Left 09/26/2023    Procedure: AMPUTATION,  SECOND TOE;  Surgeon: Malvin Marsa FALCON, DPM;  Location: [facility];  Service: Orthopedics/Podiatry;  Laterality: Left;  2nd toe   CATARACT EXTRACTION Bilateral    Dr. Lavonia   CATARACT EXTRACTION, BILATERAL     COLONOSCOPY  2004   LYMPH NODE BIOPSY Right 10/16/2014   Procedure: RIGHT INGUINAL LYMPH NODE BIOPSY;  Surgeon: Oneil Budge, MD;  Location: [facility];  Service: General;  Laterality: Right;   Patient Active Problem List   Diagnosis Date Noted   History of amputation of left great toe 08/02/2023   Osteomyelitis of great toe of left foot (HCC) 07/27/2023   Osteomyelitis (HCC) 04/25/2019   Diabetic retinopathy of both eyes (HCC)    Low back pain 12/17/2017   Irritant contact dermatitis 09/21/2016   Cervical radiculitis 02/08/2016   Hx of adenomatous colonic polyps 06/09/2015   Dyspnea on exertion 05/03/2015   Lymphadenopathy, retroperitoneal 09/17/2014   PSA elevation 05/27/2014   Iron deficiency anemia 05/27/2014   Wellness examination 04/20/2014   Diabetic foot ulcer (HCC) 11/21/2013   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   Piriformis syndrome of left side 08/25/2013   Dyspnea 07/24/2013   Knee pain 07/24/2013   Routine general medical examination at a health care facility 07/24/2013   Numbness and tingling in both hands 10/14/2012   Encounter for long-term (current) use  of other medications 11/13/2011   Disturbance of skin sensation 11/13/2011   Screening for prostate cancer 11/13/2011   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   Shoulder pain, right 11/13/2011   BALANITIS 09/01/2009   HYPOKALEMIA 05/31/2009   Essential hypertension 05/31/2009   LEG PAIN, BILATERAL 02/15/2009   HYPERCHOLESTEROLEMIA 07/02/2008   ALLERGIC RHINITIS 07/02/2008   CHF 06/19/2008   MICROSCOPIC HEMATURIA 06/19/2008   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   Hypogonadism male 01/15/2007   Diabetes (HCC) 09/22/2006   Gout  09/22/2006   RHEUMATOID ARTHRITIS 09/22/2006    PCP: Duanne Butler DASEN, MD  REFERRING PROVIDER: Ulis Bottcher, PA-C  REFERRING DIAG:  Diagnosis  M48.02 (ICD-10-CM) - Spinal stenosis in cervical region    THERAPY DIAG:  Cervical stenosis of spine  Radiculopathy, cervical region  Neck pain  Rationale for Evaluation and Treatment: Rehabilitation  ONSET DATE: a year or so but getting worse lately  SUBJECTIVE:                                                                                                                                                                                                         SUBJECTIVE STATEMENT: Pain in neck and to left shoulder this morning 7/10; ready to get this fixed; had to sleep in the chair last night  Eval:  Pain from left side of the neck down into the arm and hand sometimes up to left ear; sometimes into the right arm; going on about a year; worse lately; had one episode where felt like a lighting bolt went up my left arm to my neck and into my right arm; so decided to get it checked out.  Will be seeing about getting some injections in his neck 12/16 Hand dominance: Right  PERTINENT HISTORY:  DM; neuropathy In feet  PAIN:  Are you having pain? Yes: NPRS scale: 8/10 now; 2/10 at best; up to 10/10 Pain location: left side of neck Pain description: tingling, shooting and sharp Aggravating factors: unknown Relieving factors: put hand on top of the head  PRECAUTIONS: None  RED FLAGS: None     WEIGHT BEARING RESTRICTIONS: No  FALLS:  Has patient fallen in last 6 months? No   OCCUPATION: retired but very activearts development officer care  PLOF: Independent  PATIENT GOALS: get out of feeling so bad and hurting  NEXT MD VISIT: 03/07/23 for injections, 04/05/24  OBJECTIVE:  Note: Objective measures were completed at Evaluation unless otherwise noted.  DIAGNOSTIC FINDINGS:  MPRESSION: 1. Mild anterior subluxation of C4 on C5 on the  flexion view,  possibly degenerative or indicative of ligamentous injury. No subluxation on neutral or extension views. 2. Degenerative changes with narrowing and fusion at C5-C6 and osteophyte formation at C4-C5 and C6-C7 levels.   Electronically signed by: Elsie Gravely MD 01/20/2024 07:09 PM EST RP Workstation: [id number]  IMPRESSION: 1. Cervical spondylosis and degenerative disc disease, causing prominent impingement at C3-4; moderate impingement at C2-3 and C4-5; and borderline impingement at C5-6 and C6-7.     Electronically Signed   By: Ryan Salvage M.D.   On: 12/13/2023 15:55  PATIENT SURVEYS:  NDI:  NECK DISABILITY INDEX  Date: 01/23/2024 Score                                Total 14/50; 28%   Minimum Detectable Change (90% confidence): 5 points or 10% points NDI:  NECK DISABILITY INDEX  Date: 03/12/24 Score  Pain intensity 2 = The pain is moderate at the moment  2. Personal care (washing, dressing, etc.) 1 =  I can look after myself normally but it causes extra pain  3. Lifting 1 =  I can lift heavy weights but it gives extra pain  4. Reading 2 =  I can read as much as I want with moderate pain in my neck  5. Headaches 0 = I have no headaches at all  6. Concentration 0 =  I can concentrate fully when I want to with no difficulty  7. Work 0 =  I can do as much work as I want to  8. Driving 2 =  I can drive my car as long as I want with moderate pain in my neck  9. Sleeping 5 =  My sleep is completely disturbed (5-7 hrs sleepless)   10. Recreation 1 =  I am able to engage in all my recreation activities, with some pain in my neck  Total 14/50= 28%   Minimum Detectable Change (90% confidence): 5 points or 10% points  COGNITION: Overall cognitive status: Within functional limits for tasks assessed  SENSATION: Tingling down left arm  POSTURE: rounded shoulders and forward head  PALPATION: Tender bilat upper traps left > right   CERVICAL ROM:  * pain  Active ROM AROM (deg) eval 03/12/24:  Flexion 31 35  Extension 48* 48* pressure  Right lateral flexion 30* 38 Assisted with tingling  Left lateral flexion 38 40* Increased tingling  Right rotation 57 60  Left rotation 55* 60*   (Blank rows = not tested)  UPPER EXTREMITY ROM:  Active ROM Right eval Left eval  Shoulder flexion    Shoulder extension    Shoulder abduction    Shoulder adduction    Shoulder extension    Shoulder internal rotation    Shoulder external rotation    Elbow flexion    Elbow extension    Wrist flexion    Wrist extension    Wrist ulnar deviation    Wrist radial deviation    Wrist pronation    Wrist supination     (Blank rows = not tested)  UPPER EXTREMITY MMT:  MMT Right eval Left eval Left  03/12/24  Shoulder flexion 5 4+ 4+  Shoulder extension     Shoulder abduction   4+  Shoulder adduction     Shoulder extension     Shoulder internal rotation 5 4+ 4+  Shoulder external rotation 5 4 4+  Middle trapezius     Lower trapezius  Elbow flexion 5 4+ 4+  Elbow extension 5 4+ 4+  Wrist flexion     Wrist extension     Wrist ulnar deviation     Wrist radial deviation     Wrist pronation     Wrist supination     Grip strength      (Blank rows = not tested)  CERVICAL SPECIAL TESTS:  Distraction test: Negative  FUNCTIONAL TESTS:  5 times sit to stand: 03/04/24: 15.16 no HHA or increased pain     03/12/24 13.30 TREATMENT DATE:  03/18/24 Supine moist heat to cervical spine x 5' to decrease pain and improve soft tissue extensibility Manual traction x 10' to cervical spine UBE 2' forward and 2' back Blue theraband scapular retractions 2 x 10 Blue theraband shoulder extensions 2 x 10 Lat pulls 5 plates 2 x 10 Wall push ups 2 x 10   03/12/24: UBE 2' forward/2' behind L1 NDI 14/50= 28% Seated: - 3D cervical excursion 5x each pain free range  - posterior shoulder rolls (up, back and down) ROM see above MMT  - chin tucks,  reports increased tingling with retraction, relief with flexed neck  - scapular retraction Standing:  - RTB shoulder extension  - RTB rows   03/04/24: Seated:  - posterior shoulder rolls (up, back and down)  - 3D cervical excursion 5x each pain free range  - Rows with RTB  - Cervical retraction with verbal and tactile cueing  - UT Stretch 3x 30 5STS 15.16 no HHA or increased pain Manual in supine position with LE elevated  -STM to Cervical mm, Lt UT, manual traction  02/13/24: Reviewed goals Educated importance of HEP compliance for maximal benefits  Supine:  - Decompression 2-5 5x 5 with verbal and tactile cueing to improve mechanics Manual:  -STM to Cervical mm, Lt UT, manual traction Seated:  - Postural education  - Cervical retraction  - Scapular retraction    01/23/24 physical therapy evaluation and HEP instruction; supine manual traction 10 hold x 10                                                                                                                                PATIENT EDUCATION:  Education details: Patient educated on exam findings, POC, scope of PT, HEP, and what to expect next visit. Person educated: Patient Education method: Explanation, Demonstration, and Handouts Education comprehension: verbalized understanding, returned demonstration, verbal cues required, and tactile cues required  HOME EXERCISE PROGRAM: 01/23/24 Decompression position and shoulder press 5 hold x 10  02/13/24:  Postural awareness  Scapular retraction  Cervical retraction.  Access Code: [email or web address] URL: [email or web address] Date: 03/04/2024 Prepared by: Augustin Mclean  Exercises - Correct Seated Posture  - 1 x daily - 7 x weekly - 3 sets - 10 reps - Seated Cervical Retraction  - 1 x daily - 7 x weekly - 3 sets - 10 reps - Seated Scapular Retraction  -  1 x daily - 7 x weekly - 3 sets - 10 reps - Seated Cervical Sidebending Stretch  - 2 x daily - 7 x  weekly - 1 sets - 3 reps - 30 hold ASSESSMENT:  CLINICAL IMPRESSION: Patient continues with neck pain and radicular left upper extremity symptoms. Continued with manual traction; postural strengthening.   Left upper extremity tingling reported occasionally during treatment but no report of increased pain.    Pt will continue to benefit from skilled intervention to address goals unmet, has 1 more session and then likely d/c due to limited progress.    Eval:  Patient is a 69 y.o. male who was seen today for physical therapy evaluation and treatment for M48.02 (ICD-10-CM) - Spinal stenosis in cervical region. Patient demonstrates decreased strength, ROM restriction, reduced flexibility, increased tenderness to palpation and postural abnormalities which are likely contributing to symptoms of pain and are negatively impacting patient ability to perform ADLs. Patient will benefit from skilled physical therapy services to address these deficits to reduce pain and improve level of function with ADLs  .   OBJECTIVE IMPAIRMENTS: decreased activity tolerance, decreased ROM, decreased strength, impaired perceived functional ability, and pain.   ACTIVITY LIMITATIONS: carrying, lifting, sleeping, reach over head, and hygiene/grooming  PARTICIPATION LIMITATIONS: meal prep, cleaning, occupation, and yard work  PERSONAL FACTORS: 1 comorbidity: DM are also affecting patient's functional outcome.   REHAB POTENTIAL: Good  CLINICAL DECISION MAKING: Evolving/moderate complexity  EVALUATION COMPLEXITY: Moderate   GOALS: Goals reviewed with patient? No  SHORT TERM GOALS: Target date: 02/03/2024  patient will be independent with initial HEP and compliant with HEP 3-4 times a week   Baseline: 03/12/24:  Reports compliance with HEP daily Goal status: MET  2.  Patient will report 50% improvement overall  Baseline: 03/12/24: Doesn't feel he has made any improvements, stated it feels good during the moment but  comes right back, feels he needs surgery.   Goal status: In progress   LONG TERM GOALS: Target date: 03/06/2023  Patient will be independent in self management strategies to improve quality of life and functional outcomes.  Baseline: 03/12/24:  Reports he has learned a lot with importance of posture, sleeping position and stretches to assist with pain control. Goal status: MET  2.  Patient will report 80% improvement overall  Baseline: 03/12/24: Doesn't feel he has made any improvements, stated it feels good during the moment but comes right back, feels he needs surgery.   Goal status: In progress  3.  Patient will increase cervical mobility by 20 degrees throughout to improve ability to scan for safety with driving  Baseline: see above Goal status: In progress  4.   Patient will increase left arm MMT's to 5/5 to allow working in the yard and around the house without any UE limitation with reaching and lifting activity  Baseline:  Goal status: In progress  5.  Patient will improve NDI score by 5  points to demonstrate improved perceived function  Baseline: 14/50; 03/12/24: 14/50 Goal status: In progress   PLAN:  PT FREQUENCY: 1x/week  PT DURATION: 6 weeks  PLANNED INTERVENTIONS: [id number]- PT Re-evaluation, 97110-Therapeutic exercises, 97530- Therapeutic activity, [id number]- Neuromuscular re-education, 97535- Self Care, [id number]- Manual therapy, [id number]- Gait training, [phone]- Orthotic Fit/training, [phone]- Canalith repositioning, [id number]- Aquatic Therapy, [phone]- Splinting, [phone]- Wound care (first 20 sq cm), 97598- Wound care (each additional 20 sq cm)Patient/Family education, Balance training, Stair training, Taping, Dry Needling, Joint mobilization, Joint manipulation, Spinal manipulation, Spinal mobilization, Scar  mobilization, and DME instructions.   PLAN FOR NEXT SESSION:manual traction, postural strengthening, decompression exercises; discuss possible home traction if good response to  manual traction. Discharge next visit if continues with limited progress 8:11 AM, 03/18/24 [place] Brownstein Small Oni Dietzman MPT [facility] [place] [phone] Ph:[phone]     "

## 2024-03-19 ENCOUNTER — Ambulatory Visit: Admitting: Neurosurgery

## 2024-03-21 ENCOUNTER — Encounter: Payer: Self-pay | Admitting: Nurse Practitioner

## 2024-03-21 ENCOUNTER — Ambulatory Visit: Admitting: Nurse Practitioner

## 2024-03-21 VITALS — BP 122/68 | HR 85 | Ht 71.5 in | Wt 241.0 lb

## 2024-03-21 DIAGNOSIS — E119 Type 2 diabetes mellitus without complications: Secondary | ICD-10-CM

## 2024-03-21 DIAGNOSIS — Z794 Long term (current) use of insulin: Secondary | ICD-10-CM | POA: Diagnosis not present

## 2024-03-21 DIAGNOSIS — I10 Essential (primary) hypertension: Secondary | ICD-10-CM | POA: Diagnosis not present

## 2024-03-21 DIAGNOSIS — E782 Mixed hyperlipidemia: Secondary | ICD-10-CM

## 2024-03-21 LAB — POCT GLYCOSYLATED HEMOGLOBIN (HGB A1C): Hemoglobin A1C: 8.6 % — AB (ref 4.0–5.6)

## 2024-03-21 NOTE — Progress Notes (Signed)
 "                                                                        Endocrinology Follow Up Note       03/21/2024, 8:13 AM   Subjective:    Patient ID: NEWTON FRUTIGER, male    DOB: 06-26-1955.  Eriverto K Favila is being seen in follow up after being seen in consultation for management of currently uncontrolled symptomatic diabetes requested by  Duanne Butler DASEN, MD.   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   Lymphadenopathy    Peripheral neuropathy    PUD (peptic ulcer disease)    Rheumatoid arthritis(714.0) 09/22/2006    Past Surgical History:  Procedure Laterality Date   ACHILLES TENDON SURGERY Right 04/25/2019   Procedure: ACHILLES LENGTHENING;  Surgeon: Gershon Donnice SAUNDERS, DPM;  Location: [facility];  Service: Podiatry;  Laterality: Right;   AMPUTATION Right 04/25/2019   Procedure: AMPUTATION FOOT,TRANSMETATARSAL;  Surgeon: Gershon Donnice SAUNDERS, DPM;  Location: [facility];  Service: Podiatry;  Laterality: Right;   AMPUTATION TOE Left 07/30/2023   Procedure: AMPUTATION, TOE;  Surgeon: Malvin Marsa FALCON, DPM;  Location: [facility];  Service: Orthopedics/Podiatry;  Laterality: Left;  LEFT GREAT TOE AMPUTAION   AMPUTATION TOE Left 09/26/2023   Procedure: AMPUTATION,  SECOND TOE;  Surgeon: Malvin Marsa FALCON, DPM;  Location: [facility];  Service: Orthopedics/Podiatry;  Laterality: Left;  2nd toe   CATARACT EXTRACTION Bilateral    Dr. Lavonia   CATARACT EXTRACTION,  BILATERAL     COLONOSCOPY  2004   LYMPH NODE BIOPSY Right 10/16/2014   Procedure: RIGHT INGUINAL LYMPH NODE BIOPSY;  Surgeon: Oneil Budge, MD;  Location: [facility];  Service: General;  Laterality: Right;    Social History   Socioeconomic History   Marital status: Married    Spouse name: Not on file   Number of children: 3   Years of education: Not on file   Highest education level: Not on file  Occupational History   Occupation: Disability    Employer: [employer]  Tobacco Use   Smoking status: Former    Current packs/day: 0.00    Types: Cigarettes    Quit date: 11/02/1999    Years since quitting: 24.4   Smokeless tobacco: Never  Vaping Use   Vaping status: Never Used  Substance and Sexual Activity   Alcohol use: No    Alcohol/week: 0.0 standard drinks of alcohol   Drug use: No   Sexual activity: Not Currently  Other Topics Concern   Not on file  Social History Narrative   ** Merged History Encounter ** Married,  3 kids   Prior work [employer] before disability   Regular exercise-yes   Works some doing aeronautical engineer    Social Drivers of Health   Tobacco Use: Medium Risk (03/21/2024)   Patient History    Smoking Tobacco Use: Former    Smokeless Tobacco Use: Never    Passive Exposure: Not on file  Financial Resource Strain: Low Risk  (02/12/2024)   [facility]   Overall Financial Resource Strain (CARDIA)    Difficulty of Paying Living Expenses: Not hard at all  Food Insecurity: No Food Insecurity (02/12/2024)   Received from [facility]   Epic    Within the past 12 months, you worried that your food would run out before you got the money to buy more.: Never true    Within the past 12 months, the food you bought just didn't last and you didn't have money to get more.: Never true  Transportation Needs: No Transportation Needs (02/12/2024)   Received from [facility] - Transportation    In the past  12 months, has lack of transportation kept you from medical appointments or from getting medications?: No    Lack of Transportation (Non-Medical): No  Physical Activity: Sufficiently Active (02/15/2023)   Exercise Vital Sign    Days of Exercise per Week: 5 days    Minutes of Exercise per Session: 80 min  Stress: No Stress Concern Present (02/15/2023)   [employer] - Occupational Stress Questionnaire    Feeling of Stress : Not at all  Social Connections: Socially Integrated (07/27/2023)   Social Connection and Isolation Panel    Frequency of Communication with Friends and Family: More than three times a week    Frequency of Social Gatherings with Friends and Family: More than three times a week    Attends Religious Services: More than 4 times per year    Active Member of Clubs or Organizations: Yes    Attends Banker Meetings: More than 4 times per year    Marital Status: Married  Depression (PHQ2-9): Low Risk (10/19/2023)   Depression (PHQ2-9)    PHQ-2 Score: 0  Alcohol Screen: Low Risk (02/15/2023)   Alcohol Screen    Last Alcohol Screening Score (AUDIT): 0  Housing: Unknown (02/12/2024)   Received from [facility]   Epic    In the last 12 months, was there a time when you were not able to pay the mortgage or rent on time?: No    Number of Times Moved in the Last Year: Not on file    At any time in the past 12 months, were you homeless or living in a shelter (including now)?: No  Utilities: Not At Risk (02/12/2024)   Received from [facility]   Epic    In the past 12 months has the electric, gas, oil, or water company threatened to shut off services in your home?: No  Health Literacy: Adequate Health Literacy (02/15/2023)   B1300 Health Literacy    Frequency of need for help with medical instructions: Never    Family History  Problem Relation Age of Onset   Heart disease Other        FH of CAD    Colon polyps Paternal Uncle    Kidney disease Paternal Uncle    Diabetes Paternal Grandfather    Diabetes Maternal Uncle    Cancer Neg Hx  Colon cancer Neg Hx    Gallbladder disease Neg Hx    Esophageal cancer Neg Hx    Rectal cancer Neg Hx    Stomach cancer Neg Hx     Outpatient Encounter Medications as of 03/21/2024  Medication Sig   ACCU-CHEK GUIDE TEST test strip USE AS INSTRUCTED TO MONITOR GLUCOSE 4 TIMES DAILY   Accu-Chek Softclix Lancets lancets TEST BLOOD SUGAR 7 TIMES DAILY   aspirin  81 MG tablet Take 81 mg by mouth in the morning.   atorvastatin  (LIPITOR) 10 MG tablet TAKE 1 TABLET EVERY DAY   gabapentin  (NEURONTIN ) 300 MG capsule TAKE 3 CAPSULES TWICE A DAY AND 2 CAPSULES ONCE A DAY   insulin  isophane & regular human KwikPen (NOVOLIN  70/30 KWIKPEN) (70-30) 100 UNIT/ML KwikPen Inject 10 Units into the skin 2 (two) times daily before a meal.   INSULIN  SYRINGE 1CC/29G (DROPLET INSULIN  SYRINGE) 29G X 1/2 1 ML MISC Use to inject in morning and bedtime   losartan  (COZAAR ) 50 MG tablet Take 1 tablet (50 mg total) by mouth daily.   Tetrahydrozoline HCl (VISINE OP) Place 1 drop into both eyes daily as needed (redness).   triamcinolone  cream (KENALOG ) 0.1 % APPLY TO THE AFFECTED AREA(S) TOPICALLY THREE TIMES DAILY AS NEEDED FOR RASH   No facility-administered encounter medications on file as of 03/21/2024.    ALLERGIES: Allergies  Allergen Reactions   Penicillins Swelling and Other (See Comments)    Swelling of hands and feet Skin peeling   Lamisil [Terbinafine] Hives   Cipro  [Ciprofloxacin  Hcl] Hives and Rash   Zestril [Lisinopril] Cough    VACCINATION STATUS: Immunization History  Administered Date(s) Administered   Fluad Quad(high Dose 65+) 03/03/2022   Fluad Trivalent(High Dose 65+) 01/12/2023   INFLUENZA, HIGH DOSE SEASONAL PF 03/07/2024   Influenza Whole 12/28/2008   Influenza,inj,Quad PF,6+ Mos 11/21/2013, 04/01/2015, 12/20/2015, 01/08/2017, 12/17/2017,  10/25/2018, 12/19/2019, 03/28/2021   Moderna Sars-Covid-2 Vaccination 04/29/2019, 05/27/2019, 12/29/2019   PNEUMOCOCCAL CONJUGATE-20 10/19/2023   Pneumococcal Polysaccharide-23 06/11/2008   Td 09/29/2005   Tdap 10/15/2017   Zoster Recombinant(Shingrix) 06/14/2020, 08/25/2020    Diabetes He presents for his follow-up diabetic visit. He has type 2 diabetes mellitus. Onset time: was diagnosed at approx age of 24. His disease course has been stable. There are no hypoglycemic associated symptoms. Associated symptoms include foot paresthesias. Pertinent negatives for diabetes include no fatigue, no polydipsia, no polyphagia, no polyuria and no weight loss. There are no hypoglycemic complications. Symptoms are stable. Diabetic complications include heart disease, impotence, nephropathy and peripheral neuropathy. 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 Current diabetic treatment includes oral agent (monotherapy) and insulin  injections. He is compliant with treatment most of the time. His weight is fluctuating minimally. He is following a generally healthy diet. When asked about meal planning, he reported none. He has not had a previous visit with a dietitian. He participates in exercise three times a week. His home blood glucose trend is fluctuating minimally. His overall blood glucose range is 110-130 mg/dl. (He presents today with his logs showing at target glycemic profile overall.  His POCT A1c today is 8.6%, increasing from last visit of 6.9%.  He does have occasional drops in glucose likely due to meal timing being off.  Analysis of his meter shows 7-day average of 131, 14-day average of 136, 30-day average of 140, 90-day average of 135.  He has had several  steroid injection between visits.  He was also sick and had antibiotics as well.) An ACE inhibitor/angiotensin II  receptor blocker is being taken. He sees a podiatrist.Eye exam is current.    Review of systems  Constitutional: + increasing body weight,  current Body mass index is 33.14 kg/m. , no fatigue, no subjective hyperthermia, no subjective hypothermia Eyes: no blurry vision, no xerophthalmia ENT: no sore throat, no nodules palpated in throat, no dysphagia/odynophagia, no hoarseness Cardiovascular: no chest pain, no shortness of breath, no palpitations, no leg swelling Respiratory: no cough, no shortness of breath Gastrointestinal: no nausea/vomiting/diarrhea Musculoskeletal: chronic knee pain, pinched nerve in neck Skin: no rashes, no hyperemia Neurological: no tremors, no numbness, no tingling, no dizziness Psychiatric: no depression, no anxiety  Objective:     BP 122/68 (BP Location: Left Arm, Patient Position: Sitting, Cuff Size: Large)   Pulse 85   Ht 5' 11.5 (1.816 m)   Wt 241 lb (109.3 kg)   BMI 33.14 kg/m   Wt Readings from Last 3 Encounters:  03/21/24 241 lb (109.3 kg)  01/16/24 233 lb (105.7 kg)  01/10/24 220 lb (99.8 kg)     BP Readings from Last 3 Encounters:  03/21/24 122/68  01/16/24 (!) 148/82  01/10/24 (!) 101/55      Physical Exam- Limited  Constitutional:  Body mass index is 33.14 kg/m. , not in acute distress, normal state of mind Eyes:  EOMI, no exophthalmos Musculoskeletal: no gross deformities, strength intact in all four extremities, no gross restriction of joint movements Skin:  no rashes, no hyperemia Neurological: no tremor with outstretched hands   Diabetic Foot Exam - Simple   No data filed     CMP ( most recent) CMP     Component Value Date/Time   NA 141 10/19/2023 0941   NA 142 09/06/2023 0805   K 3.8 10/19/2023 0941   CL 110 10/19/2023 0941   CO2 20 10/19/2023 0941   GLUCOSE 89 10/19/2023 0941   BUN 19 10/19/2023 0941   BUN 21 09/06/2023 0805   CREATININE 1.16 10/19/2023 0941   CALCIUM  9.1 10/19/2023 0941   PROT 7.1  10/19/2023 0941   PROT 7.3 09/06/2023 0805   ALBUMIN 4.0 09/06/2023 0805   AST 23 10/19/2023 0941   ALT 21 10/19/2023 0941   ALKPHOS 99 09/06/2023 0805   BILITOT 0.8 10/19/2023 0941   BILITOT 0.4 09/06/2023 0805   GFRNONAA >60 07/31/2023 0539   GFRNONAA 76 05/07/2020 0816   GFRAA 89 05/07/2020 0816     Diabetic Labs (most recent): Lab Results  Component Value Date   HGBA1C 8.6 (A) 03/21/2024   HGBA1C 6.9 (H) 07/27/2023   HGBA1C 7.4 (A) 05/11/2023   MICROALBUR 3.2 10/19/2023   MICROALBUR 150 10/20/2021   MICROALBUR 7.9 05/07/2020     Lipid Panel ( most recent) Lipid Panel     Component Value Date/Time   CHOL 127 10/19/2023 0941   CHOL 120 09/06/2023 0805   TRIG 94 10/19/2023 0941   HDL 41 10/19/2023 0941   HDL 45 09/06/2023 0805   CHOLHDL 3.1 10/19/2023 0941   VLDL 33.0 10/15/2017 0818   LDLCALC 68 10/19/2023 0941   LDLDIRECT 120.2 04/04/2013 0918   LABVLDL 14 09/06/2023 0805      Lab Results  Component Value Date   TSH 0.919 09/06/2023   TSH 1.490 04/15/2021   TSH 1.10 10/15/2017   TSH 1.63 04/28/2016   TSH 0.99 05/21/2014   TSH 1.43 04/04/2013   TSH  1.28 11/13/2011   TSH 0.77 03/29/2010   TSH 0.95 09/01/2009   TSH 0.71 02/08/2009   FREET4 1.17 09/06/2023   FREET4 1.32 04/15/2021           Assessment & Plan:   1) Controlled Type 2 Diabetes without complication  He presents today with his logs showing at target glycemic profile overall.  His POCT A1c today is 8.6%, increasing from last visit of 6.9%.  He does have occasional drops in glucose likely due to meal timing being off.  Analysis of his meter shows 7-day average of 131, 14-day average of 136, 30-day average of 140, 90-day average of 135.  He has had several steroid injection between visits.  He was also sick and had antibiotics as well.  - Antonio K Hyde has currently uncontrolled symptomatic type 2 DM since 69 years of age.  -Recent labs reviewed.  - I had a long discussion with him  about the progressive nature of diabetes and the pathology behind its complications. -his diabetes is complicated by retinopathy, CKD, neuropathy, recent TMA and he remains at a high risk for more acute and chronic complications which include CAD, CVA, CKD, retinopathy, and neuropathy. These are all discussed in detail with him.  - Nutritional counseling repeated/built upon at each appointment.  - The patient admits there is a room for improvement in their diet and drink choices. -  Suggestion is made for the patient to avoid simple carbohydrates from their diet including Cakes, Sweet Desserts / Pastries, Ice Cream, Soda (diet and regular), Sweet Tea, Candies, Chips, Cookies, Sweet Pastries, Store Bought Juices, Alcohol in Excess of 1-2 drinks a day, Artificial Sweeteners, Coffee Creamer, and Sugar-free Products. This will help patient to have stable blood glucose profile and potentially avoid unintended weight gain.   - I encouraged the patient to switch to unprocessed or minimally processed complex starch and increased protein intake (animal or plant source), fruits, and vegetables.   - Patient is advised to stick to a routine mealtimes to eat 3 meals a day and avoid unnecessary snacks (to snack only to correct hypoglycemia).  - I have approached him with the following individualized plan to manage  his diabetes and patient agrees:   -He is advised to continue premixed 70/30 insulin  (he buys this OTC at [facility]) at 10 units twice daily if glucose is above 90 and he is eating.   -he is encouraged to continue monitoring blood glucose 2 times daily, before breakfast and before bed, and to call the clinic if he has readings less than 70 or greater than 300 for 3 tests in a row.  I approached him about trying a CGM to help monitor glucose since we are re-initiating treatment with insulin .  I sent for Dexcom G7 sensor to [facility] but it was too expensive for him to afford.  - he is warned not to  take insulin  without proper monitoring per orders. - Adjustment parameters are given to him for hypo and hyperglycemia in writing.  -He did not tolerate GLP1- had significant GI symptoms.  He did not tolerate SGLT2i due to yeast infection in abdominal folds.  - Specific targets for  A1c;  LDL, HDL,  and Triglycerides were discussed with the patient.  2) Blood Pressure /Hypertension:  his blood pressure is controlled to target.   he is advised to continue his current medications as prescribed by PCP.  3) Lipids/Hyperlipidemia:    Review of his recent lipid panel from 09/06/23 showed  controlled  LDL at 61 .  he  is advised to continue Lipitor 10 mg daily at bedtime.  Side effects and precautions discussed with him.  He has appt in August for his annual physical with fasting labs.  4)  Weight/Diet:  his Body mass index is 33.14 kg/m.  -   clearly complicating his diabetes care.   he is a candidate for weight loss. I discussed with him the fact that loss of 5 - 10% of his  current body weight will have the most impact on his diabetes management.  Exercise, and detailed carbohydrates information provided  -  detailed on discharge instructions.  5) Chronic Care/Health Maintenance: -he is on ACEI/ARB and Statin medications and is encouraged to initiate and continue to follow up with Ophthalmology, Dentist, Podiatrist at least yearly or according to recommendations, and advised to stay away from smoking. I have recommended yearly flu vaccine and pneumonia vaccine at least every 5 years; moderate intensity exercise for up to 150 minutes weekly; and sleep for at least 7 hours a day.  - he is advised to maintain close follow up with Duanne Butler DASEN, MD for primary care needs, as well as his other providers for optimal and coordinated care.     I spent  20  minutes in the care of the patient today including review of labs from CMP, Lipids, Thyroid  Function, Hematology (current and previous including  abstractions from other facilities); face-to-face time discussing  his blood glucose readings/logs, discussing hypoglycemia and hyperglycemia episodes and symptoms, medications doses, his options of short and long term treatment based on the latest standards of care / guidelines;  discussion about incorporating lifestyle medicine;  and documenting the encounter. Risk reduction counseling performed per USPSTF guidelines to reduce obesity and cardiovascular risk factors.     Please refer to Patient Instructions for Blood Glucose Monitoring and Insulin /Medications Dosing Guide  in media tab for additional information. Please  also refer to  Patient Self Inventory in the Media  tab for reviewed elements of pertinent patient history.  Kassim K Buscemi participated in the discussions, expressed understanding, and voiced agreement with the above plans.  All questions were answered to his satisfaction. he is encouraged to contact clinic should he have any questions or concerns prior to his return visit.   Follow up plan: - Return in about 6 months (around 09/18/2024) for Diabetes F/U with A1c in office, No previsit labs, Bring meter and logs.   Benton Rio, [facility] [address] [place], [place] [address] Phone: [phone] Fax: [phone]  03/21/2024, 8:13 AM    "

## 2024-03-25 ENCOUNTER — Ambulatory Visit (HOSPITAL_COMMUNITY)

## 2024-03-25 DIAGNOSIS — M5412 Radiculopathy, cervical region: Secondary | ICD-10-CM

## 2024-03-25 DIAGNOSIS — M542 Cervicalgia: Secondary | ICD-10-CM

## 2024-03-25 DIAGNOSIS — M4802 Spinal stenosis, cervical region: Secondary | ICD-10-CM

## 2024-03-25 NOTE — Therapy (Signed)
 " OUTPATIENT PHYSICAL THERAPY CERVICAL TREATMENT/PROGRESS NOTE Progress Note Reporting Period 01/23/2024 to 03/25/2024  See note below for Objective Data and Assessment of Progress/Goals.   PHYSICAL THERAPY DISCHARGE SUMMARY  Visits from Start of Care: 6  Current functional level related to goals / functional outcomes: See below   Remaining deficits: See below   Education / Equipment: HEP   Patient agrees to discharge. Patient goals were partially met. Patient is being discharged due to maximized rehab potential.          Patient Name: Aaron Fox MRN: [id number] DOB:Sep 26, 1955, 69 y.o., male Today's Date: 03/25/2024  END OF SESSION:  PT End of Session - 03/25/24 0729     Visit Number 6    Number of Visits 6    Date for Recertification  03/05/24    Authorization Type [employer]    Authorization Time Period cohere approved 8 visits from 01/23/24-04/12/24    PT Start Time 0729    PT Stop Time 0810    PT Time Calculation (min) 41 min    Activity Tolerance Patient tolerated treatment well    Behavior During Therapy [facility] for tasks assessed/performed          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   Lymphadenopathy    Peripheral neuropathy    PUD (peptic ulcer disease)    Rheumatoid arthritis(714.0) 09/22/2006   Past Surgical History:  Procedure Laterality Date   ACHILLES TENDON SURGERY Right 04/25/2019   Procedure: ACHILLES  LENGTHENING;  Surgeon: Gershon Donnice SAUNDERS, DPM;  Location: [facility];  Service: Podiatry;  Laterality: Right;   AMPUTATION Right 04/25/2019   Procedure: AMPUTATION FOOT,TRANSMETATARSAL;  Surgeon: Gershon Donnice SAUNDERS, DPM;  Location: [facility];  Service: Podiatry;  Laterality: Right;   AMPUTATION TOE Left 07/30/2023   Procedure: AMPUTATION, TOE;  Surgeon: Malvin Marsa FALCON, DPM;  Location: [facility];  Service: Orthopedics/Podiatry;  Laterality: Left;  LEFT GREAT TOE AMPUTAION   AMPUTATION TOE Left 09/26/2023   Procedure: AMPUTATION,  SECOND TOE;  Surgeon: Malvin Marsa FALCON, DPM;  Location: [facility];  Service: Orthopedics/Podiatry;  Laterality: Left;  2nd toe   CATARACT EXTRACTION Bilateral    Dr. Lavonia   CATARACT EXTRACTION, BILATERAL     COLONOSCOPY  2004   LYMPH NODE BIOPSY Right 10/16/2014   Procedure: RIGHT INGUINAL LYMPH NODE BIOPSY;  Surgeon: Oneil Budge, MD;  Location: [facility];  Service: General;  Laterality: Right;   Patient Active Problem List   Diagnosis Date Noted   History of amputation of left great toe 08/02/2023   Osteomyelitis of great toe of left foot (HCC) 07/27/2023   Osteomyelitis (HCC) 04/25/2019   Diabetic retinopathy of both eyes (HCC)    Low back pain 12/17/2017   Irritant contact dermatitis 09/21/2016   Cervical radiculitis 02/08/2016   Hx of adenomatous colonic polyps 06/09/2015   Dyspnea on exertion 05/03/2015   Lymphadenopathy,  retroperitoneal 09/17/2014   PSA elevation 05/27/2014   Iron deficiency anemia 05/27/2014   Wellness examination 04/20/2014   Diabetic foot ulcer (HCC) 11/21/2013   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   Piriformis syndrome of left side 08/25/2013   Dyspnea 07/24/2013   Knee pain 07/24/2013   Routine general medical examination at a health care facility 07/24/2013   Numbness and tingling in both hands 10/14/2012   Encounter for long-term (current) use of other medications 11/13/2011   Disturbance of  skin sensation 11/13/2011   Screening for prostate cancer 11/13/2011   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   Shoulder pain, right 11/13/2011   BALANITIS 09/01/2009   HYPOKALEMIA 05/31/2009   Essential hypertension 05/31/2009   LEG PAIN, BILATERAL 02/15/2009   HYPERCHOLESTEROLEMIA 07/02/2008   ALLERGIC RHINITIS 07/02/2008   CHF 06/19/2008   MICROSCOPIC HEMATURIA 06/19/2008   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   Hypogonadism male 01/15/2007   Diabetes (HCC) 09/22/2006   Gout 09/22/2006   RHEUMATOID ARTHRITIS 09/22/2006    PCP: Duanne Butler DASEN, MD  REFERRING PROVIDER: Ulis Bottcher, PA-C  REFERRING DIAG:  Diagnosis  M48.02 (ICD-10-CM) - Spinal stenosis in cervical region    THERAPY DIAG:  Cervical stenosis of spine  Radiculopathy, cervical region  Neck pain  Rationale for Evaluation and Treatment: Rehabilitation  ONSET DATE: a year or so but getting worse lately  SUBJECTIVE:                                                                                                                                                                                                         SUBJECTIVE STATEMENT: Overall maybe 70% better but still 8/10 pain in neck and to top of the Left shoulder.     Eval:  Pain from left side of the neck down into the arm and hand sometimes up to left ear; sometimes into the right arm; going on about a year; worse lately; had one episode where felt like a lighting bolt went up my left arm to my neck and into my right arm; so decided to get it checked out.  Will be seeing about getting some injections in his neck 12/16 Hand dominance: Right  PERTINENT HISTORY:  DM; neuropathy In feet  PAIN:  Are you having pain? Yes: NPRS scale: 8/10 now; 2/10 at best; up to 10/10 Pain location: left side of neck Pain description: tingling, shooting and sharp Aggravating factors: unknown Relieving factors: put hand on top of the  head  PRECAUTIONS: None  RED FLAGS: None  WEIGHT BEARING RESTRICTIONS: No  FALLS:  Has patient fallen in last 6 months? No   OCCUPATION: retired but very activearts development officer care  PLOF: Independent  PATIENT GOALS: get out of feeling so bad and hurting  NEXT MD VISIT: 03/07/23 for injections, 04/05/24  OBJECTIVE:  Note: Objective measures were completed at Evaluation unless otherwise noted.  DIAGNOSTIC FINDINGS:  MPRESSION: 1. Mild anterior subluxation of C4 on C5 on the flexion view, possibly degenerative or indicative of ligamentous injury. No subluxation on neutral or extension views. 2. Degenerative changes with narrowing and fusion at C5-C6 and osteophyte formation at C4-C5 and C6-C7 levels.   Electronically signed by: Elsie Gravely MD 01/20/2024 07:09 PM EST RP Workstation: [id number]  IMPRESSION: 1. Cervical spondylosis and degenerative disc disease, causing prominent impingement at C3-4; moderate impingement at C2-3 and C4-5; and borderline impingement at C5-6 and C6-7.     Electronically Signed   By: Ryan Salvage M.D.   On: 12/13/2023 15:55  PATIENT SURVEYS:  NDI:  NECK DISABILITY INDEX  Date: 01/23/2024 Score                                Total 14/50; 28%   Minimum Detectable Change (90% confidence): 5 points or 10% points NDI:  NECK DISABILITY INDEX  Date: 03/12/24 Score  Pain intensity 2 = The pain is moderate at the moment  2. Personal care (washing, dressing, etc.) 1 =  I can look after myself normally but it causes extra pain  3. Lifting 1 =  I can lift heavy weights but it gives extra pain  4. Reading 2 =  I can read as much as I want with moderate pain in my neck  5. Headaches 0 = I have no headaches at all  6. Concentration 0 =  I can concentrate fully when I want to with no difficulty  7. Work 0 =  I can do as much work as I want to  8. Driving 2 =  I can drive my car as long as I want with moderate pain in my neck  9.  Sleeping 5 =  My sleep is completely disturbed (5-7 hrs sleepless)   10. Recreation 1 =  I am able to engage in all my recreation activities, with some pain in my neck  Total 14/50= 28%   Minimum Detectable Change (90% confidence): 5 points or 10% points  COGNITION: Overall cognitive status: Within functional limits for tasks assessed  SENSATION: Tingling down left arm  POSTURE: rounded shoulders and forward head  PALPATION: Tender bilat upper traps left > right   CERVICAL ROM: * pain  Active ROM AROM (deg) eval 03/12/24: 03/25/24  Flexion 31 35 30  Extension 48* 48* pressure 44  Right lateral flexion 30* 38 Assisted with tingling 35  Left lateral flexion 38 40* Increased tingling 41  Right rotation 57 60 72  Left rotation 55* 60* 68   (Blank rows = not tested)  UPPER EXTREMITY ROM:  Active ROM Right eval Left eval  Shoulder flexion    Shoulder extension    Shoulder abduction    Shoulder adduction    Shoulder extension    Shoulder internal rotation    Shoulder external rotation    Elbow flexion    Elbow extension    Wrist flexion    Wrist extension    Wrist ulnar deviation    Wrist radial deviation  Wrist pronation    Wrist supination     (Blank rows = not tested)  UPPER EXTREMITY MMT:  MMT Right eval Left eval Left  03/12/24 Left 03/25/24  Shoulder flexion 5 4+ 4+ 5  Shoulder extension      Shoulder abduction   4+ 5  Shoulder adduction      Shoulder extension      Shoulder internal rotation 5 4+ 4+ 5  Shoulder external rotation 5 4 4+ 5  Middle trapezius      Lower trapezius      Elbow flexion 5 4+ 4+ 5  Elbow extension 5 4+ 4+ 5  Wrist flexion      Wrist extension      Wrist ulnar deviation      Wrist radial deviation      Wrist pronation      Wrist supination      Grip strength       (Blank rows = not tested)  CERVICAL SPECIAL TESTS:  Distraction test: Negative  FUNCTIONAL TESTS:  5 times sit to stand: 03/04/24: 15.16 no HHA or  increased pain     03/12/24 13.30 TREATMENT DATE:  03/25/24 Progress note NDI; 10/50 20% AROM of cervical spine MMT's see above Supine moist heat to cervical spine x 5' to decrease pain and improve soft tissue extensibility Manual traction x 10' to cervical spine UBE 2'/2' forward and back Blue theraband scapular retractions x 50 Blue theraband shoulder extensions x 50 Lat pull 5 plates 2 x 10   03/18/24 Supine moist heat to cervical spine x 5' to decrease pain and improve soft tissue extensibility Manual traction x 10' to cervical spine UBE 2' forward and 2' back Blue theraband scapular retractions 2 x 10 Blue theraband shoulder extensions 2 x 10 Lat pulls 5 plates 2 x 10 Wall push ups 2 x 10   03/12/24: UBE 2' forward/2' behind L1 NDI 14/50= 28% Seated: - 3D cervical excursion 5x each pain free range  - posterior shoulder rolls (up, back and down) ROM see above MMT  - chin tucks, reports increased tingling with retraction, relief with flexed neck  - scapular retraction Standing:  - RTB shoulder extension  - RTB rows   03/04/24: Seated:  - posterior shoulder rolls (up, back and down)  - 3D cervical excursion 5x each pain free range  - Rows with RTB  - Cervical retraction with verbal and tactile cueing  - UT Stretch 3x 30 5STS 15.16 no HHA or increased pain Manual in supine position with LE elevated  -STM to Cervical mm, Lt UT, manual traction  02/13/24: Reviewed goals Educated importance of HEP compliance for maximal benefits  Supine:  - Decompression 2-5 5x 5 with verbal and tactile cueing to improve mechanics Manual:  -STM to Cervical mm, Lt UT, manual traction Seated:  - Postural education  - Cervical retraction  - Scapular retraction    01/23/24 physical therapy evaluation and HEP instruction; supine manual traction 10 hold x 10  PATIENT EDUCATION:  Education details: Patient educated on exam findings, POC, scope of PT, HEP, and what to expect next visit. Person educated: Patient Education method: Explanation, Demonstration, and Handouts Education comprehension: verbalized understanding, returned demonstration, verbal cues required, and tactile cues required  HOME EXERCISE PROGRAM: 01/23/24 Decompression position and shoulder press 5 hold x 10  02/13/24:  Postural awareness  Scapular retraction  Cervical retraction.  Access Code: [email or web address] URL: [email or web address] Date: 03/04/2024 Prepared by: Augustin Mclean  Exercises - Correct Seated Posture  - 1 x daily - 7 x weekly - 3 sets - 10 reps - Seated Cervical Retraction  - 1 x daily - 7 x weekly - 3 sets - 10 reps - Seated Scapular Retraction  - 1 x daily - 7 x weekly - 3 sets - 10 reps - Seated Cervical Sidebending Stretch  - 2 x daily - 7 x weekly - 1 sets - 3 reps - 30 hold ASSESSMENT:  CLINICAL IMPRESSION: Progress note today.  Patient has met all short term goals and 2/5 long term goals.  Patient has maxed rehab potential and is agreeable to discharge at this time.    Eval:  Patient is a 69 y.o. male who was seen today for physical therapy evaluation and treatment for M48.02 (ICD-10-CM) - Spinal stenosis in cervical region. Patient demonstrates decreased strength, ROM restriction, reduced flexibility, increased tenderness to palpation and postural abnormalities which are likely contributing to symptoms of pain and are negatively impacting patient ability to perform ADLs. Patient will benefit from skilled physical therapy services to address these deficits to reduce pain and improve level of function with ADLs  .   OBJECTIVE IMPAIRMENTS: decreased activity tolerance, decreased ROM, decreased strength, impaired perceived functional ability, and pain.   ACTIVITY LIMITATIONS: carrying, lifting, sleeping, reach over head, and  hygiene/grooming  PARTICIPATION LIMITATIONS: meal prep, cleaning, occupation, and yard work  PERSONAL FACTORS: 1 comorbidity: DM are also affecting patient's functional outcome.   REHAB POTENTIAL: Good  CLINICAL DECISION MAKING: Evolving/moderate complexity  EVALUATION COMPLEXITY: Moderate   GOALS: Goals reviewed with patient? No  SHORT TERM GOALS: Target date: 02/03/2024  patient will be independent with initial HEP and compliant with HEP 3-4 times a week   Baseline: 03/12/24:  Reports compliance with HEP daily Goal status: MET  2.  Patient will report 50% improvement overall  Baseline: 03/12/24: Doesn't feel he has made any improvements, stated it feels good during the moment but comes right back, feels he needs surgery.   Goal status: met   LONG TERM GOALS: Target date: 03/06/2023  Patient will be independent in self management strategies to improve quality of life and functional outcomes.  Baseline: 03/12/24:  Reports he has learned a lot with importance of posture, sleeping position and stretches to assist with pain control. Goal status: MET  2.  Patient will report 80% improvement overall  Baseline: 03/12/24: Doesn't feel he has made any improvements, stated it feels good during the moment but comes right back, feels he needs surgery.   Goal status: In progress  3.  Patient will increase cervical mobility by 20 degrees throughout to improve ability to scan for safety with driving  Baseline: see above Goal status: In progress  4.   Patient will increase left arm MMT's to 5/5 to allow working in the yard and around the house without any UE limitation with reaching and lifting activity  Baseline:  Goal status: met  5.  Patient will improve NDI score by 5  points to demonstrate improved perceived function  Baseline: 14/50; 03/12/24: 14/50; 10/50 03/25/24  Goal status: almost met   PLAN:  PT FREQUENCY: 1x/week  PT DURATION: 6 weeks  PLANNED INTERVENTIONS:  [id number]- PT Re-evaluation, 97110-Therapeutic exercises, 97530- Therapeutic activity, 97112- Neuromuscular re-education, 97535- Self Care, [id number]- Manual therapy, [id number]- Gait training, [phone]- Orthotic Fit/training, [phone]- Canalith repositioning, [id number]- Aquatic Therapy, 97760- Splinting, [phone]- Wound care (first 20 sq cm), 97598- Wound care (each additional 20 sq cm)Patient/Family education, Balance training, Stair training, Taping, Dry Needling, Joint mobilization, Joint manipulation, Spinal manipulation, Spinal mobilization, Scar mobilization, and DME instructions.   PLAN FOR NEXT SESSION: discharge  8:09 AM, 03/25/24 Rodolph Hagemann Small Elya Diloreto MPT [facility] [facility] [place] [phone] Ph:[phone]     "

## 2024-03-28 NOTE — Progress Notes (Unsigned)
 Discharged from PT at [facility] on 03/25/2024

## 2024-04-09 ENCOUNTER — Ambulatory Visit: Admitting: Neurosurgery

## 2024-05-02 ENCOUNTER — Ambulatory Visit: Admitting: Podiatry

## 2024-09-18 ENCOUNTER — Ambulatory Visit: Admitting: Nurse Practitioner

## 2024-10-20 ENCOUNTER — Encounter: Admitting: Family Medicine
